# Patient Record
Sex: Female | Born: 1970
Health system: Southern US, Community
[De-identification: ages and names within clinical notes are randomized; demographics above are authoritative.]

## PROBLEM LIST (undated history)

## (undated) DIAGNOSIS — C7B00 Secondary carcinoid tumors, unspecified site: Secondary | ICD-10-CM

## (undated) DIAGNOSIS — I493 Ventricular premature depolarization: Secondary | ICD-10-CM

## (undated) DIAGNOSIS — I10 Essential (primary) hypertension: Secondary | ICD-10-CM

## (undated) DIAGNOSIS — R109 Unspecified abdominal pain: Secondary | ICD-10-CM

## (undated) DIAGNOSIS — R55 Syncope and collapse: Secondary | ICD-10-CM

## (undated) DIAGNOSIS — I509 Heart failure, unspecified: Secondary | ICD-10-CM

## (undated) DIAGNOSIS — Z923 Personal history of irradiation: Secondary | ICD-10-CM

## (undated) DIAGNOSIS — C801 Malignant (primary) neoplasm, unspecified: Secondary | ICD-10-CM

## (undated) DIAGNOSIS — D3A098 Benign carcinoid tumors of other sites: Secondary | ICD-10-CM

## (undated) DIAGNOSIS — C50919 Malignant neoplasm of unspecified site of unspecified female breast: Secondary | ICD-10-CM

## (undated) DIAGNOSIS — R197 Diarrhea, unspecified: Secondary | ICD-10-CM

## (undated) DIAGNOSIS — C50419 Malignant neoplasm of upper-outer quadrant of unspecified female breast: Secondary | ICD-10-CM

## (undated) DIAGNOSIS — Z7189 Other specified counseling: Secondary | ICD-10-CM

## (undated) HISTORY — DX: Heart failure, unspecified: I50.9

## (undated) HISTORY — DX: Secondary carcinoid tumors, unspecified site: C7B.00

## (undated) HISTORY — DX: Malignant neoplasm of unspecified site of unspecified female breast: C50.919

## (undated) HISTORY — DX: Morbid (severe) obesity due to excess calories: E66.01

## (undated) HISTORY — DX: Benign carcinoid tumors of other sites: D3A.098

## (undated) HISTORY — DX: Diarrhea, unspecified: R19.7

## (undated) HISTORY — DX: Essential (primary) hypertension: I10

---
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
## 1999-10-10 DIAGNOSIS — D3A098 Benign carcinoid tumors of other sites: Secondary | ICD-10-CM

## 1999-10-10 HISTORY — PX: OVARIAN CYST REMOVAL: SHX89

## 1999-10-10 HISTORY — PX: COLON SURGERY: SHX602

## 1999-10-10 HISTORY — DX: Benign carcinoid tumors of other sites: D3A.098

## 2011-10-10 DIAGNOSIS — Z9221 Personal history of antineoplastic chemotherapy: Secondary | ICD-10-CM

## 2011-10-10 DIAGNOSIS — Z923 Personal history of irradiation: Secondary | ICD-10-CM

## 2011-10-10 HISTORY — DX: Personal history of irradiation: Z92.3

## 2011-10-10 HISTORY — DX: Personal history of antineoplastic chemotherapy: Z92.21

## 2012-03-14 ENCOUNTER — Telehealth: Payer: Self-pay | Admitting: Hematology & Oncology

## 2012-03-14 NOTE — Telephone Encounter (Signed)
Pt aware of 03-18-12 appointment

## 2012-03-18 ENCOUNTER — Ambulatory Visit: Payer: Medicare Other

## 2012-03-18 ENCOUNTER — Ambulatory Visit (HOSPITAL_BASED_OUTPATIENT_CLINIC_OR_DEPARTMENT_OTHER): Payer: Medicare Other | Admitting: Hematology & Oncology

## 2012-03-18 ENCOUNTER — Other Ambulatory Visit (HOSPITAL_BASED_OUTPATIENT_CLINIC_OR_DEPARTMENT_OTHER): Payer: Medicare Other | Admitting: Lab

## 2012-03-18 ENCOUNTER — Encounter: Payer: Self-pay | Admitting: Hematology & Oncology

## 2012-03-18 VITALS — BP 143/91 | HR 103 | Temp 97.6°F | Ht 67.0 in | Wt 312.0 lb

## 2012-03-18 DIAGNOSIS — D3A098 Benign carcinoid tumors of other sites: Secondary | ICD-10-CM

## 2012-03-18 DIAGNOSIS — C7A00 Malignant carcinoid tumor of unspecified site: Secondary | ICD-10-CM

## 2012-03-18 DIAGNOSIS — C787 Secondary malignant neoplasm of liver and intrahepatic bile duct: Secondary | ICD-10-CM

## 2012-03-18 LAB — CBC WITH DIFFERENTIAL (CANCER CENTER ONLY)
BASO%: 0.2 % (ref 0.0–2.0)
EOS%: 5.2 % (ref 0.0–7.0)
HGB: 12.7 g/dL (ref 11.6–15.9)
LYMPH#: 2 10*3/uL (ref 0.9–3.3)
MCH: 27.5 pg (ref 26.0–34.0)
MCHC: 31.8 g/dL — ABNORMAL LOW (ref 32.0–36.0)
MONO%: 7.1 % (ref 0.0–13.0)
NEUT#: 8.9 10*3/uL — ABNORMAL HIGH (ref 1.5–6.5)
Platelets: 316 10*3/uL (ref 145–400)

## 2012-03-18 NOTE — Progress Notes (Signed)
This office note has been dictated.

## 2012-03-19 NOTE — Progress Notes (Signed)
CC:   Amber Starch, MD Amber Lento, PA-C  DIAGNOSIS:  Metastatic carcinoid with liver metastasis.  HISTORY OF PRESENT ILLNESS:  Amber Bautista is a very nice 41 year old [place] female.  She is originally from [place].  She has been down here for about 7 years or so.  She was initially diagnosed with carcinoid back in 2002.  This was up in [place]. This was in [place].  She then moved down to the [place] area.  She had a child.  She reports that she was also found to have recurrence.  She had hepatic recurrence. She has undergone transarterial chemoembolization.  I think the last procedure was back in May, 2010.  She has been seeing Dr. Nelda Marseille over at [facility].  However, it is becoming more of an issue for her to go out there because of cost.  As such, she wanted to be seen close to home.  She lives in [place].  She was told that she could not be seen at [facility]  As such, we are seeing her to try to provide continuity of care for her carcinoid.  She has been getting Sandostatin.  She began monthly.  However, she has not had this now, probably, for about 4 or 5 months.  She has missed several appointments at [place], there are what seem to be insurance issues.  She has been having diarrhea.  She has about 5 bowel movements a day. She denies any flushing. There is no wheezing.  There is no chest pain. She has had no nausea.  She denies any leg swelling.  There have been no rashes.  Overall, her performance status is ECOG 0.  I think that the last set of scans that I have on her were back in February.  These were CT scans.  She had no suspicious pulmonary nodules.  She had some borderline mediastinal nodes.  She had a larger defined lesion in the liver (segment 2).  She had other additional low- density lesions throughout the liver.  She had no obvious bony lesions in appearance.  MRI of the abdomen and pelvis done in March showed some  abdominal lymph nodes.  She does have a lesion within segment 4 of the liver.  There is a lesion in the right lobe of the liver.  Again she has not had Sandostatin for about 4 months from what she says.  She was able to make it to the office today, so we could initiate our management followup.  PAST MEDICAL HISTORY: 1. Hypertension. 2. Metastatic carcinoid.  ALLERGIES:  None.  MEDICATIONS:  Zestoretic (20/25) 1 p.o. daily.  SOCIAL HISTORY:  Negative for tobacco use.  She has no alcohol use.  She has no obvious occupational exposures.  FAMILY HISTORY:  Noncontributory.  REVIEW OF SYSTEMS:  As stated in history of present illness.  No additional findings noted on a 12 system review.  PHYSICAL EXAMINATION:  This is a somewhat obese Hispanic female in no obvious distress.  Vital signs:  97.6, pulse 103, respiratory rate 24, blood pressure 143/91.  Weight is 312.  Head and neck:  Normocephalic, atraumatic skull.  There are no ocular or oral lesions.  There are no palpable cervical or supraclavicular lymph nodes.  Lungs:  Clear bilaterally.  There are no rales, wheezing, rhonchi.  Cardiac:  Regular rhythm with a normal S1 and S2.  There are no murmurs, rubs or bruits. Abdomen:  Obese but soft.  She  has laparotomy scars that are well- healed.  There is no fluid wave.  There is no guarding or rebound tenderness.  There is no palpable hepatosplenomegaly. Back:  No tenderness over the spine, ribs, or hips.  Extremities: No clubbing, cyanosis or edema.  Neurologic:  No focal neurological deficits.  LABORATORY STUDIES:  White cell count is 12.5, hemoglobin is 12.7, hematocrit 39.9, platelet count 316.  IMPRESSION:  Amber Bautista is a 41 year old Hispanic female with metastatic carcinoid.  She has been dealing with metastatic disease now for 6 years.  She has obviously done quite well.  It does not sound like she has a lot of disease burden from the last scans that I have see on her.  We  need to get her back on Sandostatin.  I believe this is the 1st "order of business."  I do not see that we need to embark upon any kind of chemotherapy for her.  I know that Afinitor was approved for pancreatic neuroendocrine tumors. That certainly would be 1 option.  It sounds like she has had nice response to intrahepatic therapy.  One could always consider yttrium-90 therapy if liver metastases are deemed to be more active.  I did give her a 24-hour urine so we can see her 5-HIAA level.  Will go ahead and get her set up with monthly for Sandostatin injections.  I want to see her back in 1 month.  Once we have all of our lab work back, then we might be able to figure out when to set up for scans again to assess for her disease status.  I spent a good hour or more with Amber Bautista today.  Again, she is very, very nice.    ______________________________ Amber Bautista, M.D. PRE/MEDQ  D:  03/18/2012  T:  03/19/2012  Job:  [id number]   ADDENDUM:  Chromogranin A is 60.  24-hr urine 5-HIAA is 37.

## 2012-03-21 ENCOUNTER — Ambulatory Visit (HOSPITAL_BASED_OUTPATIENT_CLINIC_OR_DEPARTMENT_OTHER): Payer: Medicare Other

## 2012-03-21 VITALS — BP 157/94 | HR 99 | Temp 97.3°F

## 2012-03-21 DIAGNOSIS — C787 Secondary malignant neoplasm of liver and intrahepatic bile duct: Secondary | ICD-10-CM

## 2012-03-21 DIAGNOSIS — D3A098 Benign carcinoid tumors of other sites: Secondary | ICD-10-CM

## 2012-03-21 DIAGNOSIS — C7A00 Malignant carcinoid tumor of unspecified site: Secondary | ICD-10-CM

## 2012-03-21 MED ORDER — OCTREOTIDE ACETATE 30 MG IM KIT
30.0000 mg | PACK | Freq: Once | INTRAMUSCULAR | Status: AC
  Administered 2012-03-21: 30 mg via INTRAMUSCULAR

## 2012-03-21 NOTE — Patient Instructions (Signed)

## 2012-03-26 LAB — COMPREHENSIVE METABOLIC PANEL
ALT: 23 U/L (ref 0–35)
AST: 20 U/L (ref 0–37)
Albumin: 3.6 g/dL (ref 3.5–5.2)
Alkaline Phosphatase: 64 U/L (ref 39–117)
Calcium: 9.2 mg/dL (ref 8.4–10.5)
Chloride: 103 mEq/L (ref 96–112)
Potassium: 3.5 mEq/L (ref 3.5–5.3)
Sodium: 140 mEq/L (ref 135–145)
Total Protein: 7 g/dL (ref 6.0–8.3)

## 2012-03-26 LAB — CHROMOGRANIN A: Chromogranin A: 60 ng/mL — ABNORMAL HIGH (ref 1.9–15.0)

## 2012-04-17 ENCOUNTER — Other Ambulatory Visit: Payer: Medicare Other | Admitting: Lab

## 2012-04-17 ENCOUNTER — Ambulatory Visit (HOSPITAL_BASED_OUTPATIENT_CLINIC_OR_DEPARTMENT_OTHER): Payer: Medicare Other

## 2012-04-17 ENCOUNTER — Ambulatory Visit (HOSPITAL_BASED_OUTPATIENT_CLINIC_OR_DEPARTMENT_OTHER): Payer: Medicare Other | Admitting: Medical

## 2012-04-17 VITALS — BP 148/89 | HR 59 | Temp 97.2°F | Ht 67.0 in | Wt 311.0 lb

## 2012-04-17 DIAGNOSIS — C787 Secondary malignant neoplasm of liver and intrahepatic bile duct: Secondary | ICD-10-CM

## 2012-04-17 DIAGNOSIS — C7A00 Malignant carcinoid tumor of unspecified site: Secondary | ICD-10-CM

## 2012-04-17 DIAGNOSIS — R197 Diarrhea, unspecified: Secondary | ICD-10-CM

## 2012-04-17 DIAGNOSIS — D3A098 Benign carcinoid tumors of other sites: Secondary | ICD-10-CM

## 2012-04-17 DIAGNOSIS — D3A00 Benign carcinoid tumor of unspecified site: Secondary | ICD-10-CM

## 2012-04-17 MED ORDER — OCTREOTIDE ACETATE 30 MG IM KIT
30.0000 mg | PACK | Freq: Once | INTRAMUSCULAR | Status: AC
  Administered 2012-04-17: 30 mg via INTRAMUSCULAR

## 2012-04-17 NOTE — Patient Instructions (Signed)

## 2012-04-18 NOTE — Progress Notes (Signed)
Patient Name : Amber Bautista, Amber Bautista MR [id number] DOB: 07-Jan-1971 Encounter Date: 04/17/2012 Dictated by Helayne Seminole, PA-C  Diagnosis: Metastatic carcinoid with liver metastasis.  Current therapy: Sandostatin LAR 30mg  IM monthly  Interim history: Ms. Amber Bautista presents today for an office followup visit.  Again, Ms. Amber Bautista has been dealing with metastatic carcinoid disease for about 6 years now.  Overall, she has done quite nicely.  She was being treated down at [facility] and being seen by Dr.Yacoub however it was becoming more of an issue for her financially, as well as with transportation.  When she was first seen by Dr. Marin Bautista, she had not been getting any Sandostatin for about 4 or 5 months.  She recently started back on Sandostatin on 03/18/2012.  She was having quite a bit of diarrhea before that time.  Ms. Amber Bautista reports that since she started back up to Sandostatin.  She still continues to have some minimal diarrhea, but it is not as bad as it used to be.  The last set of scans that we have on her was back in February.  These were CT scans at that time.  She had no suspicious pulmonary nodules.  She did have some borderline mediastinal nodes.  She had a larger defined lesion in the liver.  She had other additional low density lesions throughout the liver.  She had no obvious bony lesions in appearance.  She also had an MRI of the abdomen, and pelvis in March, which showed some abdominal lymph nodes.  Also, a lesion within segment 4 of the liver.  There is a lesion in the right lobe of the liver.  Currently, she is not reporting any flushing or wheezing.  She has no chest pain, or shortness of breath.  She denies any nausea, vomiting, any leg swelling.  She denies any rashes.  She is eating and drinking quite well.  She is able to perform her activities of daily living.  She denies any obvious bleeding.  She denies any type of pain.  Ms. Amber Bautista will go ahead and receive her Sandostatin today.  On her last visit.   We did order a 24-hour urine, so we can see her 5-HIAA level and that is 37.  Her chromogranin A. a 60.  Review of Systems:Significant for intermittent diarrhea Pt. Denies any changes in their vision, hearing, adenopathy, fevers, chills, nausea, vomiting, constipation, chest pain, shortness of breath, passing blood, passing out, blacking out,  any changes in skin, joints, neurologic or psychiatric except as noted.  Physical Exam: This is a pleasant, 41 year old, Hispanic female, in no obvious distress  Vitals: Temperature 97.2 degrees.  Pulse 59, respirations 22 blood pressure 148/89, weight 311 pounds HEENT reveals a normocephalic, atraumatic skull, no scleral icterus, no oral lesions  Neck is supple without any cervical or supraclavicular adenopathy.  Lungs are clear to auscultation bilaterally. No rales, wheezing or rhonchi Cardiac is regular rate and rhythm with a normal S1 and S2. There are no murmurs, rubs, or bruits.  Abdomen is soft with good bowel sounds there's a palpable mass. There is no palpable hepatosplenomegaly. There is no palpable fluid wave.  Musculoskeletal no tenderness of the spine, ribs, or hips.  Extremities there are no clubbing, cyanosis, or edema.  Skin no petechia, purpura or ecchymosis Neurologic is nonfocal.  Laboratory Data: No current labs today, however, began, her 24 hour urine/5-HIAA level was 37, and her chromogranin A. with 60  Current Outpatient Prescriptions on File Prior to Visit  Medication  Sig Dispense Refill  . LISINOPRIL-HYDROCHLOROTHIAZIDE PO Take 25 mg by mouth every morning.       Impression/Plan: Ms. Amber Bautista is a pleasant, 41 year old, Hispanic female with the following issues. #1.  Metastatic carcinoid with liver metastasis-she will go ahead and receive her monthly Sandostatin LAR 30 mg IM today.  This seems to keep the diarrhea under control.  We will have to set up scans for her here in the near future.  Depending on the results of her scan.   We can continue obviously with the Sandostatin.  A Finochietto or was approved for pancreatic neuroendocrine tumors, which with certainly be an option.  We could always consider Y.-90 therapy if her liver metastases are deemed to be more active.  The plan is to set her up with some scans.  When she returns to see Dr. Marin Bautista in one month.  #2 followup-Ms. Amber Bautista will follow back up with Dr. Marin Bautista in one month, but before then should there be questions or concerns

## 2012-05-03 ENCOUNTER — Telehealth: Payer: Self-pay | Admitting: Hematology & Oncology

## 2012-05-03 NOTE — Telephone Encounter (Signed)
Pt called said she cannot afford to pay 20% for the injections. RN aware and Abran Richard is looking into assistance

## 2012-05-07 ENCOUNTER — Telehealth: Payer: Self-pay | Admitting: Hematology & Oncology

## 2012-05-07 NOTE — Telephone Encounter (Signed)
Faxed completed application along with required documentation to [facility] for review.  Patient is needing financial assistance for Sandostatin.

## 2012-05-16 ENCOUNTER — Other Ambulatory Visit: Payer: Medicare Other | Admitting: Lab

## 2012-05-16 ENCOUNTER — Ambulatory Visit: Payer: Medicare Other

## 2012-05-16 ENCOUNTER — Ambulatory Visit: Payer: Medicare Other | Admitting: Hematology & Oncology

## 2012-05-20 ENCOUNTER — Telehealth: Payer: Self-pay | Admitting: Hematology & Oncology

## 2012-05-20 NOTE — Telephone Encounter (Signed)
Patient called and cx 814/13 appt and resch for 06/13/12

## 2012-05-22 ENCOUNTER — Ambulatory Visit: Payer: Medicare Other

## 2012-05-22 ENCOUNTER — Other Ambulatory Visit: Payer: Medicare Other | Admitting: Lab

## 2012-05-22 ENCOUNTER — Ambulatory Visit: Payer: Medicare Other | Admitting: Hematology & Oncology

## 2012-06-13 ENCOUNTER — Ambulatory Visit: Payer: Medicare Other | Admitting: Hematology & Oncology

## 2012-06-13 ENCOUNTER — Other Ambulatory Visit: Payer: Medicare Other | Admitting: Lab

## 2012-06-13 ENCOUNTER — Telehealth: Payer: Self-pay | Admitting: Hematology & Oncology

## 2012-06-13 NOTE — Telephone Encounter (Signed)
PT RESCHEDULED MISSED 9-5 TO 9-16

## 2012-06-24 ENCOUNTER — Ambulatory Visit: Payer: Medicare Other | Admitting: Hematology & Oncology

## 2012-06-24 ENCOUNTER — Other Ambulatory Visit: Payer: Medicare Other | Admitting: Lab

## 2012-06-25 ENCOUNTER — Telehealth: Payer: Self-pay | Admitting: Hematology & Oncology

## 2012-06-25 ENCOUNTER — Encounter: Payer: Self-pay | Admitting: *Deleted

## 2012-06-25 NOTE — Telephone Encounter (Signed)
Left pt message to call and reschedule missed 9-16

## 2012-07-01 ENCOUNTER — Telehealth: Payer: Self-pay | Admitting: Hematology & Oncology

## 2012-07-01 NOTE — Telephone Encounter (Signed)
Pt called made 9-27 appointment said was in hospital and needs to see MD

## 2012-07-05 ENCOUNTER — Other Ambulatory Visit (HOSPITAL_BASED_OUTPATIENT_CLINIC_OR_DEPARTMENT_OTHER): Payer: Medicare Other | Admitting: Lab

## 2012-07-05 ENCOUNTER — Other Ambulatory Visit: Payer: Self-pay | Admitting: Hematology & Oncology

## 2012-07-05 ENCOUNTER — Telehealth: Payer: Self-pay | Admitting: Hematology & Oncology

## 2012-07-05 ENCOUNTER — Ambulatory Visit (HOSPITAL_BASED_OUTPATIENT_CLINIC_OR_DEPARTMENT_OTHER): Payer: Medicare Other | Admitting: Medical

## 2012-07-05 VITALS — BP 142/75 | HR 115 | Temp 98.5°F | Resp 18 | Ht 67.0 in | Wt 312.0 lb

## 2012-07-05 DIAGNOSIS — N6320 Unspecified lump in the left breast, unspecified quadrant: Secondary | ICD-10-CM

## 2012-07-05 DIAGNOSIS — C7A00 Malignant carcinoid tumor of unspecified site: Secondary | ICD-10-CM

## 2012-07-05 DIAGNOSIS — N63 Unspecified lump in unspecified breast: Secondary | ICD-10-CM

## 2012-07-05 DIAGNOSIS — C787 Secondary malignant neoplasm of liver and intrahepatic bile duct: Secondary | ICD-10-CM

## 2012-07-05 DIAGNOSIS — D3A098 Benign carcinoid tumors of other sites: Secondary | ICD-10-CM

## 2012-07-05 NOTE — Progress Notes (Signed)
Diagnosis: Metastatic carcinoid with liver metastasis.  Current therapy: Sandostatin LAR 30mg  IM monthly  Interim history: Amber Bautista presents today for followup after being admitted to [facility].  Amber Bautista reports, that she was having some pretty bad abdominal pain, and decided to go to [facility].  She did have a CT of the abdomen, and pelvis with contrast, that was done on 06/27/2012 at [facility], which revealed no acute abnormality.  Known left lobe liver mass.  Smaller when compared to 2008.  There is also retroperitoneal lymph node consistent with metastasis.  Larger when compared to [id number], but of uncertain pathologic significance currently.  It also revealed a left breast mass. The last set of scans that we have on her was back in February.  These were CT scans at that time.  She had no suspicious pulmonary nodules.  She did have some borderline mediastinal nodes.  She had a larger defined lesion in the liver.  She had other additional low density lesions throughout the liver.  She had no obvious bony lesions in appearance.  She also had an MRI of the abdomen, and pelvis in March, which showed some abdominal lymph nodes.  Also, a lesion within segment 4 of the liver.  There is a lesion in the right lobe of the liver.  Ms. cruise and did up having an ultrasound, as well as a mammogram, secondary to the, breast mass.  That was found on her CT scan.  The ultrasound of the left breast revealed a solid 1.7 cm mass with irregular margins and mixed vascularity at the 2:00 position in the left breast.  The right breast was normal.  Right now, this is the biggest issue.  We will have to go ahead and order a biopsy of the left breast mass.  We will go ahead and get that set up so, hopefully, we can get this taken care of quickly.  Amber Bautista remains on Sandostatin.  Since 03/18/2012.  She has not been very consistent with this on a monthly basis due to insurance purposes.  Ms.  Bautista reports, that she is in a hurry today and does not have time to get her Sandostatin; however, will return next week to get.  I stressed the importance of sticking to the Sandostatin on a monthly basis to help control the side effects of her carcinoid disease.  She does have some minimal diarrhea, but it is not as bad as it used to be.  Currently, she's not reporting any major flushing or wheezing.  She's not reporting any chest pain, cough, or shortness of breath.  She denies any nausea, vomiting, any leg swelling.  She denies any headaches, visual changes, or rashes.  She is able to perform her activities of daily living.  She denies any obvious, bleeding.  Her last 5 HIAA quantitative 24-hour urine, was 35.7.  Her last chromogranin A was 60.  These both were done back in June, 2013.  Review of Systems:Significant for intermittent diarrhea Pt. Denies any changes in their vision, hearing, adenopathy, fevers, chills, nausea, vomiting, constipation, chest pain, shortness of breath, passing blood, passing out, blacking out,  any changes in skin, joints, neurologic or psychiatric except as noted.  Physical Exam: This is a pleasant, 41 year old, Hispanic female, in no obvious distress  Vitals: Temperature 98.5 degrees, pulse 114, respirations 18, blood pressure 142/75.  Weight 312 pounds HEENT reveals a normocephalic, atraumatic skull, no scleral icterus, no oral lesions  Neck  is supple without any cervical or supraclavicular adenopathy.  Lungs are clear to auscultation bilaterally. No rales, wheezing or rhonchi Cardiac is regular rate and rhythm with a normal S1 and S2. There are no murmurs, rubs, or bruits.  Abdomen is soft with good bowel sounds there's a palpable mass. There is no palpable hepatosplenomegaly. There is no palpable fluid wave.  Musculoskeletal no tenderness of the spine, ribs, or hips.  Extremities there are no clubbing, cyanosis, or edema.  Skin no petechia, purpura or  ecchymosis Neurologic is nonfocal. Bilateral breast exam-left breast-I am unable to palpate any type of lesion.  However, she does have very large dense breast.  I do not see any obvious, erythema, swelling, nipple discharge, or peau d'orange.  Right breast is negative for any palpable masses, any erythema, swelling, nipple discharge, or peau d'orange.  Laboratory Data: 138, potassium 3.6, BUN 12, creatinine 0.8, 0, AST 17, ALT 22, calcium 9.8  Imaging: CT of the abdomen, with contrast on 06/27/2012. #1.  No acute abnormality.   #2 known left lobe liver mass.  Smaller when compared to 2008.  There is also retroperitoneal lymph node consistent with metastasis.  Larger when compared to [id number], but of uncertain pathologic significance.  Currently. #3 left breast mass.  Ultrasound of left breast: #1 solid 1.7 cm mass with irregular margins and mixed vascularity at the 2:00 position in the left breast.  Current Outpatient Prescriptions on File Prior to Visit  Medication Sig Dispense Refill  . LISINOPRIL-HYDROCHLOROTHIAZIDE PO Take 25 mg by mouth every morning.       Impression/Plan: Amber Bautista is a pleasant, 41 year old, Hispanic female with the following issues.  #1.  Metastatic carcinoid with liver metastasis- she will go ahead and receive her monthly Sandostatin LAR 30 mg IM the beginning of next week.  Again, I did stress the importance of staying on the Sandostatin on a monthly basis for better control.  #2  New left breast mass-we will go ahead and set Amber Bautista up for biopsy of her left breast mass.  Once we get pathology back from her biopsy we can discuss further treatment  #3  followup-we will be in touch with Amber Bautista prior to her next visit.

## 2012-07-05 NOTE — Telephone Encounter (Signed)
Talked with Amber Bautista at [place] scheduled 07-12-12 mammogram and biopsy. Provider is putting in order in epic. Pt is aware of 1pm 07-12-12 appointment and not to wear deodorant,lotion, powder and perfume. She is aware to eat a late breakfast.

## 2012-07-08 ENCOUNTER — Ambulatory Visit: Payer: Medicare Other

## 2012-07-08 DIAGNOSIS — N6320 Unspecified lump in the left breast, unspecified quadrant: Secondary | ICD-10-CM | POA: Insufficient documentation

## 2012-07-10 LAB — COMPREHENSIVE METABOLIC PANEL
AST: 17 U/L (ref 0–37)
Alkaline Phosphatase: 70 U/L (ref 39–117)
BUN: 12 mg/dL (ref 6–23)
Glucose, Bld: 123 mg/dL — ABNORMAL HIGH (ref 70–99)
Sodium: 138 mEq/L (ref 135–145)
Total Bilirubin: 0.4 mg/dL (ref 0.3–1.2)

## 2012-07-12 ENCOUNTER — Ambulatory Visit
Admission: RE | Admit: 2012-07-12 | Discharge: 2012-07-12 | Disposition: A | Discharge status: Home / Self Care | Payer: Medicare Other | Source: Ambulatory Visit | Attending: Hematology & Oncology | Admitting: Hematology & Oncology

## 2012-07-12 ENCOUNTER — Other Ambulatory Visit: Payer: Medicare Other

## 2012-07-12 DIAGNOSIS — N6320 Unspecified lump in the left breast, unspecified quadrant: Secondary | ICD-10-CM

## 2012-07-12 DIAGNOSIS — C50919 Malignant neoplasm of unspecified site of unspecified female breast: Secondary | ICD-10-CM

## 2012-07-12 HISTORY — DX: Malignant neoplasm of unspecified site of unspecified female breast: C50.919

## 2012-07-15 ENCOUNTER — Telehealth: Payer: Self-pay | Admitting: Hematology & Oncology

## 2012-07-15 ENCOUNTER — Other Ambulatory Visit: Payer: Self-pay | Admitting: Hematology & Oncology

## 2012-07-15 DIAGNOSIS — C50912 Malignant neoplasm of unspecified site of left female breast: Secondary | ICD-10-CM

## 2012-07-15 NOTE — Telephone Encounter (Signed)
Juliann Pulse from [facility] called said Dr. Nyoka Cowden talked to Dr. Marin Olp and that they ordered MRI. Pt is aware. Left note for Abran Richard to precert per request of [place]

## 2012-07-19 ENCOUNTER — Inpatient Hospital Stay: Admission: RE | Admit: 2012-07-19 | Payer: Medicare Other | Source: Ambulatory Visit

## 2012-07-22 ENCOUNTER — Encounter (INDEPENDENT_AMBULATORY_CARE_PROVIDER_SITE_OTHER): Payer: Medicare Other | Admitting: General Surgery

## 2012-07-26 ENCOUNTER — Ambulatory Visit
Admission: RE | Admit: 2012-07-26 | Discharge: 2012-07-26 | Disposition: A | Discharge status: Home / Self Care | Payer: Medicare Other | Source: Ambulatory Visit | Attending: Hematology & Oncology | Admitting: Hematology & Oncology

## 2012-07-26 DIAGNOSIS — C50912 Malignant neoplasm of unspecified site of left female breast: Secondary | ICD-10-CM

## 2012-07-26 MED ORDER — GADOBENATE DIMEGLUMINE 529 MG/ML IV SOLN
20.0000 mL | Freq: Once | INTRAVENOUS | Status: AC | PRN
  Administered 2012-07-26: 20 mL via INTRAVENOUS

## 2012-08-01 ENCOUNTER — Other Ambulatory Visit: Payer: Self-pay | Admitting: Medical

## 2012-08-01 ENCOUNTER — Ambulatory Visit (INDEPENDENT_AMBULATORY_CARE_PROVIDER_SITE_OTHER): Payer: Medicare Other | Admitting: General Surgery

## 2012-08-01 ENCOUNTER — Other Ambulatory Visit (INDEPENDENT_AMBULATORY_CARE_PROVIDER_SITE_OTHER): Payer: Self-pay | Admitting: General Surgery

## 2012-08-01 ENCOUNTER — Telehealth: Payer: Self-pay | Admitting: Hematology & Oncology

## 2012-08-01 ENCOUNTER — Encounter (INDEPENDENT_AMBULATORY_CARE_PROVIDER_SITE_OTHER): Payer: Self-pay | Admitting: General Surgery

## 2012-08-01 VITALS — BP 142/86 | HR 78 | Temp 98.6°F | Resp 16 | Ht 68.0 in | Wt 311.8 lb

## 2012-08-01 DIAGNOSIS — C50419 Malignant neoplasm of upper-outer quadrant of unspecified female breast: Secondary | ICD-10-CM | POA: Insufficient documentation

## 2012-08-01 HISTORY — DX: Malignant neoplasm of upper-outer quadrant of unspecified female breast: C50.419

## 2012-08-01 NOTE — Patient Instructions (Signed)
You had been diagnosed with a invasive cancer of the left breast, upper outer quadrant, 2 cm diameter. This tumor is negative for estrogen receptors, progesterone receptors, and Herceptin sensitivity.  Because of this you will likely be offered chemotherapy by Dr. Marin Olp.    Helayne Seminole, PA  in his office is going to call you and set up an appointment to see Dr. Marin Olp early next week to decide about chemotherapy  You will tentatively be scheduled for a left partial mastectomy with needle localization, left axillary sentinel node biopsy, and insertion of Port-A-Cath.   Lumpectomy, Breast Conserving Surgery A lumpectomy is breast surgery that removes only part of the breast. Another name used may be partial mastectomy. The amount removed varies. Make sure you understand how much of your breast will be removed. Reasons for a lumpectomy:  Any solid breast mass.  Grouped significant nodularity that may be confused with a solitary breast mass. Lumpectomy is the most common form of breast cancer surgery today. The surgeon removes the portion of your breast which contains the tumor (cancer). This is the lump. Some normal tissue around the lump is also removed to be sure that all the tumor has been removed.  If cancer cells are found in the margins where the breast tissue was removed, your surgeon will do more surgery to remove the remaining cancer tissue. This is called re-excision surgery. Radiation and/or chemotherapy treatments are often given following a lumpectomy to kill any cancer cells that could possibly remain.  REASONS YOU MAY NOT BE ABLE TO HAVE BREAST CONSERVING SURGERY:  The tumor is located in more than one place.  Your breast is small and the tumor is large so the breast would be disfigured.  The entire tumor removal is not successful with a lumpectomy.  You cannot commit to a full course of chemotherapy, radiation therapy or are pregnant and cannot have radiation.  You have  previously had radiation to the breast to treat cancer. HOW A LUMPECTOMY IS PERFORMED If overnight nursing is not required following a biopsy, a lumpectomy can be performed as a same-day surgery. This can be done in a hospital, clinic, or surgical center. The anesthesia used will depend on your surgeon. They will discuss this with you. A general anesthetic keeps you sleeping through the procedure. LET YOUR CAREGIVERS KNOW ABOUT THE FOLLOWING:  Allergies  Medications taken including herbs, eye drops, over the counter medications, and creams.  Use of steroids (by mouth or creams)  Previous problems with anesthetics or Novocaine.  Possibility of pregnancy, if this applies  History of blood clots (thrombophlebitis)  History of bleeding or blood problems.  Previous surgery  Other health problems BEFORE THE PROCEDURE You should be present one hour prior to your procedure unless directed otherwise.  AFTER THE PROCEDURE  After surgery, you will be taken to the recovery area where a nurse will watch and check your progress. Once you're awake, stable, and taking fluids well, barring other problems you will be allowed to go home.  Ice packs applied to your operative site may help with discomfort and keep the swelling down.  A small rubber drain may be placed in the breast for a couple of days to prevent a hematoma from developing in the breast.  A pressure dressing may be applied for 24 to 48 hours to prevent bleeding.  Keep the wound dry.  You may resume a normal diet and activities as directed. Avoid strenuous activities affecting the arm on the side  of the biopsy site such as tennis, swimming, heavy lifting (more than 10 pounds) or pulling.  Bruising in the breast is normal following this procedure.  Wearing a bra - even to bed - may be more comfortable and also help keep the dressing on.  Change dressings as directed.  Only take over-the-counter or prescription medicines for  pain, discomfort, or fever as directed by your caregiver. Call for your results as instructed by your surgeon. Remember it is your responsibility to get the results of your lumpectomy if your surgeon asked you to follow-up. Do not assume everything is fine if you have not heard from your caregiver. SEEK MEDICAL CARE IF:   There is increased bleeding (more than a small spot) from the wound.  You notice redness, swelling, or increasing pain in the wound.  Pus is coming from wound.  An unexplained oral temperature above 102 F (38.9 C) develops.  You notice a foul smell coming from the wound or dressing. SEEK IMMEDIATE MEDICAL CARE IF:   You develop a rash.  You have difficulty breathing.  You have any allergic problems. Document Released: 11/06/2006 Document Revised: 12/18/2011 Document Reviewed: 02/07/2007 [facility] Patient Information 2013 [place].

## 2012-08-01 NOTE — Progress Notes (Addendum)
Patient ID: Amber Bautista, female   DOB: [birth year], 41 y.o.   MRN: [id number]  No chief complaint on file.   HPI Amber Bautista is a 41 y.o. female.  She is referred by Dr. Conchita Paris at the breast center of [facility] for evaluation of a new cancer of the left breast. She is followed by Burney Gauze for metastatic carcinoid tumor.  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 She is on Sandostatin because of multiple liver metastasis. She was admitted to [place] [facility] because of abdominal pain and imaging studies revealed a 1.7 cm mass in the upper-outer quadrant of the left breast. She was discharged and referred to the breast center of [facility] by Dr. Antonieta Pert office. Imaging studies and image guided biopsy of this upper-outer quadrant mass on the left showed invasive ductal carcinoma, triple negative breast cancer, positive LVI.       MRI shows a solitary 2 cm mass in the upper outer quadrant left breast. Otherwise negative.  Past history is significant for morbid obesity. She was undergoing surgery for a bariatric procedure in [place] in 2001 the surgeon discovered a carcinoid tumor of the intestine. They abandoned the bariatric surgery performed an intestinal resection. Details unknown. Liver metastasis were discovered in 2007. She has problems with diarrhea and takes Sandostatin which only partially controls this. She is followed by Dr. Burney Gauze.    She remains morbidly obese. She has hypertension.  She now lives in [place] with her mother and 43-year-old son. She is divorced. She is on disability.  Family history is negative for breast or ovarian cancer. HPI  Past Medical History  Diagnosis Date  . Carcinoid tumor of intestine 03/18/2012  . Cancer   . Hypertension     Past Surgical History  Procedure Date  . Cesarean section 1988 2007  . Ovarian cyst removal 2001  . Colon surgery     Family History    Problem Relation Age of Onset  . Heart attack Father   . Cancer Paternal Grandmother     Social History History  Substance Use Topics  . Smoking status: Former Research scientist (life sciences)  . Smokeless tobacco: Not on file  . Alcohol Use: No    No Known Allergies  Current Outpatient Prescriptions  Medication Sig Dispense Refill  . diphenhydramine-acetaminophen (TYLENOL PM) 25-500 MG TABS Take 1 tablet by mouth at bedtime as needed.      Marland Kitchen LISINOPRIL-HYDROCHLOROTHIAZIDE PO Take 25 mg by mouth every morning.        Review of Systems Review of Systems  Constitutional: Positive for fatigue. Negative for fever, chills and unexpected weight change.  HENT: Negative for hearing loss, congestion, sore throat, trouble swallowing and voice change.   Eyes: Negative for visual disturbance.  Respiratory: Negative for cough and wheezing.   Cardiovascular: Negative for chest pain, palpitations and leg swelling.  Gastrointestinal: Positive for diarrhea. Negative for nausea, vomiting, abdominal pain, constipation, blood in stool, abdominal distention and anal bleeding.  Genitourinary: Negative for hematuria, vaginal bleeding and difficulty urinating.  Musculoskeletal: Negative for arthralgias.  Skin: Negative for rash and wound.  Neurological: Negative for seizures, syncope and headaches.  Hematological: Negative for adenopathy. Does not bruise/bleed easily.  Psychiatric/Behavioral: Negative for confusion. The patient is nervous/anxious.     Blood pressure 142/86, pulse 78, temperature 98.6 F (37 C), resp. rate 16, height 5\' 8"  (1.727 m), weight 311 lb 12.8 oz (141.432 kg).  Physical Exam Physical Exam  Constitutional: She is oriented to person, place, and time. She appears well-developed and well-nourished. No distress.       Weight 311 lbs.  HENT:  Head: Normocephalic and atraumatic.  Nose: Nose normal.  Mouth/Throat: No oropharyngeal exudate.  Eyes: Conjunctivae normal and EOM are normal. Pupils are  equal, round, and reactive to light. Left eye exhibits no discharge. No scleral icterus.  Neck: Neck supple. No JVD present. No tracheal deviation present. No thyromegaly present.  Cardiovascular: Normal rate, regular rhythm, normal heart sounds and intact distal pulses.   No murmur heard. Pulmonary/Chest: Effort normal and breath sounds normal. No respiratory distress. She has no wheezes. She has no rales. She exhibits no tenderness.       Bilateral breast exam reveals that breasts are very large. No palpable mass. No pathologic skin change. No axillary adenopathy.  Abdominal: Soft. Bowel sounds are normal. She exhibits no distension and no mass. There is no tenderness. There is no rebound and no guarding.       Midline scar. No hernia. Liver and spleen not palpable. Morbidly obese.  Musculoskeletal: She exhibits no edema and no tenderness.  Lymphadenopathy:    She has no cervical adenopathy.  Neurological: She is alert and oriented to person, place, and time. She exhibits normal muscle tone. Coordination normal.  Skin: Skin is warm. No rash noted. She is not diaphoretic. No erythema. No pallor.  Psychiatric: She has a normal mood and affect. Her behavior is normal. Judgment and thought content normal.    Data Reviewed Recent office note from Dr. Marin Olp  regarding carcinoid tumor. Imaging studies. Pathology report.  Assessment    Invasive ductal carcinoma left breast, upper outer quadrant, triple negative breast cancer. This is either T1C or small T2 tumor.  Carcinoid tumor of intestine, metastatic to liver  Carcinoid syndrome  Morbid obesity  Hypertension    Plan    We talked a long time about management of her breast cancer. She is strongly motivated for breast conservation, and I agree that would be in her best interest considering the size of her breast and her comorbidities.  Because of she is triple negative breast cancer I spoke with Helayne Seminole, PA at   Dr. Antonieta Pert  office about chemotherapy and need for Port-A-Cath. She says that she will probably be offered chemotherapy and that we should put a Port-A-Cath in. She is going to arrange for the patient to see Dr. Marin Olp early next week, preop  The patient will be scheduled for left partial mastectomy with needle localization, left axillary sentinel liver biopsy, insertion of Port-A-Cath in the near future.  I discussed the indications, details, techniques and numerous risk of the surgery with her. She understands these issues. She agrees with this plan. Her questions are answered.       Edsel Petrin. Dalbert Batman, M.D., [facility] General and Minimally invasive Surgery Breast and Colorectal Surgery Office:   [phone] Pager:   [phone]  08/01/2012, 11:58 AM

## 2012-08-01 NOTE — Telephone Encounter (Signed)
Left message with 10-29 appointment

## 2012-08-06 ENCOUNTER — Ambulatory Visit (HOSPITAL_BASED_OUTPATIENT_CLINIC_OR_DEPARTMENT_OTHER): Payer: Medicare Other | Admitting: Medical

## 2012-08-06 ENCOUNTER — Ambulatory Visit (HOSPITAL_BASED_OUTPATIENT_CLINIC_OR_DEPARTMENT_OTHER): Payer: Medicare Other

## 2012-08-06 VITALS — BP 143/77 | HR 96 | Temp 98.0°F | Resp 18 | Ht 68.0 in | Wt 312.0 lb

## 2012-08-06 DIAGNOSIS — C7A00 Malignant carcinoid tumor of unspecified site: Secondary | ICD-10-CM

## 2012-08-06 DIAGNOSIS — C787 Secondary malignant neoplasm of liver and intrahepatic bile duct: Secondary | ICD-10-CM

## 2012-08-06 DIAGNOSIS — C50419 Malignant neoplasm of upper-outer quadrant of unspecified female breast: Secondary | ICD-10-CM

## 2012-08-06 DIAGNOSIS — D3A098 Benign carcinoid tumors of other sites: Secondary | ICD-10-CM

## 2012-08-06 MED ORDER — OCTREOTIDE ACETATE 30 MG IM KIT
30.0000 mg | PACK | Freq: Once | INTRAMUSCULAR | Status: AC
  Administered 2012-08-06: 30 mg via INTRAMUSCULAR

## 2012-08-06 NOTE — Progress Notes (Signed)
Diagnoses #1 new invasive ductal carcinoma of the left breast, upper, outer quadrant, triple negative #2 metastatic carcinoid with liver metastasis.  Current therapy: #1 Sandostatin LAR 30 mg IM monthly for metastatic carcinoid. #2 patient to be scheduled for left partial mastectomy with needle localization, left axillary sentinel node biopsy, and insertion of Port-A-Cath  Interim history: Amber Bautista presents today to discuss need for Port-A-Cath placement.  Unfortunately, Amber Bautista was recently diagnosed with invasive ductal carcinoma of the left breast.  Her pathology did reveal triple negative breast cancer.  Amber Bautista did followup with Dr. Dalbert Batman, who plans to do a left partial mastectomy with needle localization and left axillary sentinel node biopsy.  The overall plan for Amber Bautista is to give her adjuvant chemotherapy for which she will need a Port-A-Cath for her.  Overall, Amber Bautista, reports, that she's doing relatively well considering the recent news of her new diagnosis with breast cancer.  Amber Bautista agrees that breast conservation would be her best interest considering the size of her breasts and her comorbidities.  Due to her triple negative.  Status.  I did explain that she won't need adjuvant chemotherapy, most likely with Adriamycin, Cytoxan, and Taxotere x4 cycles.  Amber Bautista, understands her triple negative status, and the need for adjuvant chemotherapy.  I did advise Amber Bautista, the need of a Port-A-Cath placement, while she is receiving chemotherapy.  I also explained how Adriamycin is a vesicant and we would prefer to use a Port-A-Cath instead of using her veins.  She voiced her understanding.  I also informed.  Amber Bautista, that she will need a 2-D echo to evaluate her ejection fraction prior to chemotherapy. In terms of her metastatic carcinoid, she continues to remain on Sandostatin monthly.  However, she has not been that compliant on a monthly basis.  She will get her injection today.  She  reports, that her diarrhea is minimal.  She denies any type of flushing.  She denies any nausea, vomiting, chest pain, shortness of breath, or cough.  She denies any fevers, chills, or night sweats.  She denies any headaches, visual changes, or rashes.  She denies any lower leg swelling.  She denies any obvious, bleeding.  Her last 5 HIAA quantitative 24 hour urine was 35.7.  Her last chromogranin A. Was 79.  Review of Systems: Pt. Denies any changes in their vision, hearing, adenopathy, fevers, chills, nausea, vomiting, diarrhea, constipation, chest pain, shortness of breath, passing blood, passing out, blacking out,  any changes in skin, joints, neurologic or psychiatric except as noted.  Physical Exam: This is a morbidly, obese, 41 year old, female, in no obvious distress Vitals: Temperature 90.0 degrees, pulse 96, respirations 18, blood pressure 143/77, weight 312 pounds HEENT reveals a normocephalic, atraumatic skull, no scleral icterus, no oral lesions  Neck is supple without any cervical or supraclavicular adenopathy.  Lungs are clear to auscultation bilaterally. There are no wheezes, rales or rhonci Cardiac is regular rate and rhythm with a normal S1 and S2. There are no murmurs, rubs, or bruits.  Abdomen is soft with good bowel sounds, there is no palpable mass. There is no palpable hepatosplenomegaly. There is no palpable fluid wave.  Musculoskeletal no tenderness of the spine, ribs, or hips.  Extremities there are no clubbing, cyanosis, or edema.  Skin no petechia, purpura or ecchymosis Neurologic is nonfocal.  Current Outpatient Prescriptions on File Prior to Visit  Medication Sig Dispense Refill  . diphenhydramine-acetaminophen (TYLENOL PM) 25-500 MG TABS Take 1  tablet by mouth at bedtime as needed.      Marland Kitchen LISINOPRIL-HYDROCHLOROTHIAZIDE PO Take 25 mg by mouth every morning.       Assessment/Plan: Amber Bautista is a pleasant, 41 year old, Hispanic female, with the following issues:  #1  newly diagnosed invasive ductal carcinoma of the left breast, upper, outer quadrant, triple negative.  Amber Bautista is scheduled for a left partial mastectomy with needle localization and left axillary sentinel node biopsy on 08/23/2012.  At that time.  She will also have her Port-A-Cath placed.  She will then receive adjuvant chemotherapy, most likely utilizing Adriamycin, Cytoxan, and Taxotere.  We discussed at length.  The toxicity related to TAC to include, but not limited to: Bone marrow suppression , to include anemia, neutropenia and thrombocytopenia, nausea, vomiting, diarrhea, neuropathy, hair loss, mucositis, skin changes, sun sensitivity, nail changes, fatigue, increased risk of infection to include sepsis, fluid retention, headache, weight loss.  We will also go ahead and get Amber Bautista set up for her 2D ECHO prior to chemotherapy to evaluate ejection fraction.  #2 metastatic carcinoid with liver metastasis.  I did discuss the importance of staying compliant with her Sandostatin.  She will receive Sandostatin LAR 30 mg today.  She should receive this on a monthly basis.   #3 follow up.   Amber Bautista will follow back up with [place] on 09/06/2012, but before then should there be questions or concerns.

## 2012-08-06 NOTE — Patient Instructions (Signed)

## 2012-08-09 ENCOUNTER — Encounter (HOSPITAL_COMMUNITY): Payer: Self-pay | Admitting: Pharmacy Technician

## 2012-08-14 ENCOUNTER — Ambulatory Visit (HOSPITAL_BASED_OUTPATIENT_CLINIC_OR_DEPARTMENT_OTHER): Admission: RE | Admit: 2012-08-14 | Payer: Medicare Other | Source: Ambulatory Visit

## 2012-08-15 ENCOUNTER — Telehealth (INDEPENDENT_AMBULATORY_CARE_PROVIDER_SITE_OTHER): Payer: Self-pay | Admitting: General Surgery

## 2012-08-15 ENCOUNTER — Encounter (HOSPITAL_COMMUNITY)
Admission: RE | Admit: 2012-08-15 | Discharge: 2012-08-15 | Disposition: A | Discharge status: Home / Self Care | Payer: Medicare Other | Source: Ambulatory Visit | Attending: General Surgery | Admitting: General Surgery

## 2012-08-15 ENCOUNTER — Encounter (HOSPITAL_COMMUNITY): Payer: Self-pay

## 2012-08-15 LAB — COMPREHENSIVE METABOLIC PANEL
ALT: 48 U/L — ABNORMAL HIGH (ref 0–35)
AST: 29 U/L (ref 0–37)
Alkaline Phosphatase: 87 U/L (ref 39–117)
CO2: 26 mEq/L (ref 19–32)
GFR calc Af Amer: >90 mL/min (ref >90)
GFR calc non Af Amer: >90 mL/min (ref >90)
Glucose, Bld: 104 mg/dL — ABNORMAL HIGH (ref 70–99)
Potassium: 3.7 mEq/L (ref 3.5–5.1)
Sodium: 137 mEq/L (ref 135–145)
Total Protein: 8.2 g/dL (ref 6.0–8.3)

## 2012-08-15 LAB — CBC WITH DIFFERENTIAL/PLATELET
Basophils Absolute: 0 10*3/uL (ref 0.0–0.1)
Lymphocytes Relative: 24 % (ref 12–46)
Lymphs Abs: 3.3 10*3/uL (ref 0.7–4.0)
Neutro Abs: 9.1 10*3/uL — ABNORMAL HIGH (ref 1.7–7.7)
Neutrophils Relative %: 65 % (ref 43–77)
Platelets: 387 10*3/uL (ref 150–400)
RBC: 4.79 MIL/uL (ref 3.87–5.11)
WBC: 13.9 10*3/uL — ABNORMAL HIGH (ref 4.0–10.5)

## 2012-08-15 LAB — URINE MICROSCOPIC-ADD ON

## 2012-08-15 LAB — URINALYSIS, ROUTINE W REFLEX MICROSCOPIC
Bilirubin Urine: NEGATIVE
Ketones, ur: NEGATIVE mg/dL
Nitrite: NEGATIVE
pH: 7 (ref 5.0–8.0)

## 2012-08-15 LAB — HCG, SERUM, QUALITATIVE: Preg, Serum: NEGATIVE

## 2012-08-15 LAB — SURGICAL PCR SCREEN
MRSA, PCR: NEGATIVE
Staphylococcus aureus: NEGATIVE

## 2012-08-15 MED ORDER — CHLORHEXIDINE GLUCONATE 4 % EX LIQD: 1.0000 "application " | Freq: Once | CUTANEOUS | Status: DC

## 2012-08-15 NOTE — Pre-Procedure Instructions (Signed)
Pass Christian  08/15/2012   Your procedure is scheduled on:  Friday, November 15th.  Report to [facility]  After your 9:30 appointment at the [facility].  Call this number if you have problems the morning of surgery: [phone]   Remember:   Do not eat food or anything to drink:After Midnight.     Take these medicines the morning of surgery with A SIP OF WATER: None   Do not wear jewelry, make-up or nail polish.  Do not wear lotions, powders, or perfumes. You may wear deodorant.  Do not shave 48 hours prior to surgery. Men may shave face and neck.  Do not bring valuables to the hospital.  Contacts, dentures or bridgework may not be worn into surgery.  Leave suitcase in the car. After surgery it may be brought to your room.  For patients admitted to the hospital, checkout time is 11:00 AM the day of discharge.   Patients discharged the day of surgery will not be allowed to drive home.  Name and phone number of your driver: NA  Special Instructions: Shower using CHG 2 nights before surgery and the night before surgery.  If you shower the day of surgery use CHG.  Use special wash - you have one bottle of CHG for all showers.  You should use approximately 1/3 of the bottle for each shower.   Please read over the following fact sheets that you were given: Pain Booklet, Coughing and Deep Breathing and Surgical Site Infection Prevention

## 2012-08-15 NOTE — Brief Op Note (Signed)
Informed Dr Oletta Lamas of pt's abnormal EKG. Also informed that ECHO ordered by Dr. Dalbert Batman was not done at [facility]. Pt stated ECHO will be done 11/13. Called Dr Dalbert Batman office and made [facility] aware .

## 2012-08-15 NOTE — Telephone Encounter (Signed)
Enid Derry, [facility] Short Stay, called to alert Dr. Dalbert Batman his pt had an abnormal EKG today at her pre-op appt.  Surgery is schedule on 08/23/12.  The EKG today showed NST, rate 106, with LBBB.  Pt scheduled for ECHO on 08/21/12.  Anesthesiologist is aware.  No cardiac clearance as requested.  Dr. Dalbert Batman updated with this and ordered nothing further at this time.

## 2012-08-16 LAB — URINE CULTURE

## 2012-08-16 NOTE — Consult Note (Addendum)
Anesthesia Chart Review:  Patient is a 41 year old female scheduled for left partial mastectomy with needle localization, left axillary NS biopsy, insertion of Port-a-cath on 08/23/12 by Dr. Dalbert Batman.  History includes non-smoker, morbid obesity, HTN, left breast cancer, metastatic carcinoid tumor of the intestine s/p resection in [place] '01 (liver metastasis discovered in 2007 s/p transarterial chemoembolization '10, now on Sandostatin).  Oncologist is Dr. Burney Gauze.   Her EKG on 08/15/12 showed ST @ 106, possible LAE, LAD, left BBB.  I received some records from [facility].  She was diagnosed with a left BBB on 10/21/08.    Echo done at [facility] on 10/22/08 showed normal LV function with mild LVH, mild LAE, insignificant gradient across Ao valve, possible mild AS (AV peak velocity 211 cm/s, peak gradient 17.81 mmHg, mean gradient 10.1 mmHG, VTI 38 cm, AVA (contunuity) 1.74 cm2), no significant carcinoid valvular heart disease noted.  She is for an echo at [facility] on 08/21/12.  Meds include lisinopril-HCTZ and Tylenol PM.  CXR on 08/15/12 showed no acute abnormality.  Labs noted.  WBC 13.9.  H/H 13.4/41.7.  PLT 387.  Cr 0.56.  Glucose 104.  Negative serum pregnancy.  I was off on 08/15/12, but according to her PAT RN and [facility] RN notes, Anesthesiologist Dr. Oletta Lamas and Dr. Dalbert Batman were both made aware of patient's EKG findings.  Patient is getting an echo on 08/21/12 @ 1000.  Will follow-up results as available.  Myra Gianotti, PA-C 08/16/12 1258  Addendum: 08/21/12 1600 Echo done earlier today showed:  - Left ventricle: The cavity size was severely dilated. Systolic function was severely reduced. The estimated ejection fraction was in the range of 25% to 30%. - Mitral valve: Mild regurgitation. - Left atrium: The atrium was mildly dilated.  I spoke with Triage nurse Pamala Hurry at [facility] re: echo results.  She does not think results have been reviewed yet by Dr. Dalbert Batman (will route report to  his in-basket) and does not see that Ms. Hettinger is followed by a cardiologist.  Her left BBB has been chronic for three years, but LV dysfunction is new.  She is on ACEI therapy.  She will likely need to be evaluated by a cardiologist in the near future (defer decision to Dr. Marin Olp and/or Dr. Dalbert Batman).  [facility]) did not have any open appointments prior to patient's surgery date.  I reviewed above with Anesthesiologist Dr. Chriss Driver.  She will be evaluated by her assigned anesthesiologist on the day of surgery.  If patient was not having any acute CV/CHF symptoms he did not necessarily feel patient would have to be evaluated by a cardiologist prior to her mastectomy.

## 2012-08-21 ENCOUNTER — Ambulatory Visit (HOSPITAL_BASED_OUTPATIENT_CLINIC_OR_DEPARTMENT_OTHER)
Admission: RE | Admit: 2012-08-21 | Discharge: 2012-08-21 | Disposition: A | Discharge status: Home / Self Care | Payer: Medicare Other | Source: Ambulatory Visit | Attending: Medical | Admitting: Medical

## 2012-08-21 DIAGNOSIS — I059 Rheumatic mitral valve disease, unspecified: Secondary | ICD-10-CM | POA: Insufficient documentation

## 2012-08-21 DIAGNOSIS — Z01812 Encounter for preprocedural laboratory examination: Secondary | ICD-10-CM | POA: Insufficient documentation

## 2012-08-21 DIAGNOSIS — C50919 Malignant neoplasm of unspecified site of unspecified female breast: Secondary | ICD-10-CM | POA: Insufficient documentation

## 2012-08-21 DIAGNOSIS — I10 Essential (primary) hypertension: Secondary | ICD-10-CM | POA: Insufficient documentation

## 2012-08-21 DIAGNOSIS — I517 Cardiomegaly: Secondary | ICD-10-CM | POA: Insufficient documentation

## 2012-08-21 DIAGNOSIS — Z01818 Encounter for other preprocedural examination: Secondary | ICD-10-CM | POA: Insufficient documentation

## 2012-08-21 DIAGNOSIS — Z01810 Encounter for preprocedural cardiovascular examination: Secondary | ICD-10-CM | POA: Insufficient documentation

## 2012-08-21 DIAGNOSIS — C50419 Malignant neoplasm of upper-outer quadrant of unspecified female breast: Secondary | ICD-10-CM

## 2012-08-21 NOTE — Progress Notes (Signed)
  Echocardiogram 2D Echocardiogram has been performed.  Amber Bautista 08/21/2012, 12:53 PM

## 2012-08-22 MED ORDER — DEXTROSE 5 % IV SOLN
3.0000 g | INTRAVENOUS | Status: DC
  Filled 2012-08-22: qty 3000

## 2012-08-22 MED ORDER — HEPARIN SODIUM (PORCINE) 5000 UNIT/ML IJ SOLN
5000.0000 [IU] | Freq: Once | INTRAMUSCULAR | Status: AC
  Administered 2012-08-23: 5000 [IU] via SUBCUTANEOUS
  Filled 2012-08-22: qty 1

## 2012-08-22 NOTE — H&P (Signed)
Amber Bautista     MRN: [id number]   Description: 41 year old female  Provider: Adin Hector, MD  Department: [facility]       Diagnoses     Cancer of upper-outer quadrant of female breast   - Primary    174.4         Vitals    BP Pulse Temp Resp Ht Wt    142/86 78 98.6 F (37 C) 16 5\' 8"  (1.727 m) 311 lb 12.8 oz (141.432 kg)   BMI - 47.41 kg/m2                 History and Physical     Adin Hector, MD   Patient ID: Amber Bautista, female   DOB: 1971/03/12, 41 y.o.   MRN: [id number]            HPI Amber Bautista is a 41 y.o. female.  She is referred by Dr. Conchita Paris at the breast center of [facility] for evaluation of a new cancer of the left breast. She is followed by Burney Gauze for metastatic carcinoid tumor.   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 She is on Sandostatin because of multiple liver metastasis. She was admitted [place] [facility] because of abdominal pain and imaging studies revealed a 1.7 cm mass in the upper-outer quadrant of the left breast. She was discharged and referred to the breast center of [facility] by Dr. Antonieta Pert office. Imaging studies and image guided biopsy of this upper-outer quadrant mass on the left showed invasive ductal carcinoma, triple negative breast cancer, positive LVI.       MRI shows a solitary 2 cm mass in the upper outer quadrant left breast. Otherwise negative.   Past history is significant for morbid obesity. She was undergoing surgery for a bariatric procedure in [place] in 2001 the surgeon discovered a carcinoid tumor of the intestine. They abandoned the bariatric surgery performed an intestinal resection. Details unknown. Liver metastasis were discovered in 2007. She has problems with diarrhea and takes Sandostatin which only partially controls this. She is followed by Dr. Burney Gauze.    She remains morbidly obese. She has  hypertension.   She now lives in [place] with her mother and 75-year-old son. She is divorced. She is on disability.   Family history is negative for breast or ovarian cancer.       Past Medical History   Diagnosis  Date   .  Carcinoid tumor of intestine  03/18/2012   .  Cancer     .  Hypertension         Past Surgical History   Procedure  Date   .  Cesarean section  1988 2007   .  Ovarian cyst removal  2001   .  Colon surgery         Family History   Problem  Relation  Age of Onset   .  Heart attack  Father     .  Cancer  Paternal Grandmother        Social History History   Substance Use Topics   .  Smoking status:  Former Research scientist (life sciences)   .  Smokeless tobacco:  Not on file   .  Alcohol Use:  No      No Known Allergies    Current Outpatient Prescriptions   Medication  Sig  Dispense  Refill   .  diphenhydramine-acetaminophen (TYLENOL PM) 25-500 MG TABS  Take 1 tablet by mouth at bedtime as needed.         Marland Kitchen  LISINOPRIL-HYDROCHLOROTHIAZIDE PO  Take 25 mg by mouth every morning.            Review of Systems   Constitutional: Positive for fatigue. Negative for fever, chills and unexpected weight change.  HENT: Negative for hearing loss, congestion, sore throat, trouble swallowing and voice change.   Eyes: Negative for visual disturbance.  Respiratory: Negative for cough and wheezing.   Cardiovascular: Negative for chest pain, palpitations and leg swelling.  Gastrointestinal: Positive for diarrhea. Negative for nausea, vomiting, abdominal pain, constipation, blood in stool, abdominal distention and anal bleeding.  Genitourinary: Negative for hematuria, vaginal bleeding and difficulty urinating.  Musculoskeletal: Negative for arthralgias.  Skin: Negative for rash and wound.  Neurological: Negative for seizures, syncope and headaches.  Hematological: Negative for adenopathy. Does not bruise/bleed easily.  Psychiatric/Behavioral: Negative for confusion. The patient is  nervous/anxious.     Blood pressure 142/86, pulse 78, temperature 98.6 F (37 C), resp. rate 16, height 5\' 8"  (1.727 m), weight 311 lb 12.8 oz (141.432 kg).   Physical Exam   Constitutional: She is oriented to person, place, and time. She appears well-developed and well-nourished. No distress.       Weight 311 lbs.  HENT:   Head: Normocephalic and atraumatic.   Nose: Nose normal.   Mouth/Throat: No oropharyngeal exudate.  Eyes: Conjunctivae normal and EOM are normal. Pupils are equal, round, and reactive to light. Left eye exhibits no discharge. No scleral icterus.  Neck: Neck supple. No JVD present. No tracheal deviation present. No thyromegaly present.  Cardiovascular: Normal rate, regular rhythm, normal heart sounds and intact distal pulses.    No murmur heard. Pulmonary/Chest: Effort normal and breath sounds normal. No respiratory distress. She has no wheezes. She has no rales. She exhibits no tenderness.       Bilateral breast exam reveals that breasts are very large. No palpable mass. No pathologic skin change. No axillary adenopathy.  Abdominal: Soft. Bowel sounds are normal. She exhibits no distension and no mass. There is no tenderness. There is no rebound and no guarding.       Midline scar. No hernia. Liver and spleen not palpable. Morbidly obese.  Musculoskeletal: She exhibits no edema and no tenderness.  Lymphadenopathy:    She has no cervical adenopathy.  Neurological: She is alert and oriented to person, place, and time. She exhibits normal muscle tone. Coordination normal.  Skin: Skin is warm. No rash noted. She is not diaphoretic. No erythema. No pallor.  Psychiatric: She has a normal mood and affect. Her behavior is normal. Judgment and thought content normal.    Data Reviewed Recent office note from Dr. Marin Olp  regarding carcinoid tumor. Imaging studies. Pathology report.   Assessment Invasive ductal carcinoma left breast, upper outer quadrant, triple negative  breast cancer. This is either T1C or small T2 tumor.   Carcinoid tumor of intestine, metastatic to liver   Carcinoid syndrome   Morbid obesity   Hypertension   Plan We talked a long time about management of her breast cancer. She is strongly motivated for breast conservation, and I agree that would be in her best interest considering the size of her breast and her comorbidities.   Because of she is triple negative breast cancer I spoke with Helayne Seminole, PA at   Dr. Antonieta Pert office about chemotherapy and need for Port-A-Cath.  She says that she will probably be offered chemotherapy and that we should put a Port-A-Cath in. She is going to arrange for the patient to see Dr. Marin Olp early next week, preop   The patient will be scheduled for left partial mastectomy with needle localization, left axillary sentinel liver biopsy, insertion of Port-A-Cath in the near future.   I discussed the indications, details, techniques and numerous risk of the surgery with her. She understands these issues. She agrees with this plan. Her questions are answered.       Edsel Petrin. Dalbert Batman, M.D., [facility] General and Minimally invasive Surgery Breast and Colorectal Surgery Office:   [phone] Pager:   [phone]

## 2012-08-23 ENCOUNTER — Ambulatory Visit (HOSPITAL_COMMUNITY)
Admission: RE | Admit: 2012-08-23 | Discharge: 2012-08-23 | Disposition: A | Discharge status: Home / Self Care | Payer: Medicare Other | Source: Ambulatory Visit | Attending: General Surgery | Admitting: General Surgery

## 2012-08-23 ENCOUNTER — Encounter (HOSPITAL_COMMUNITY)
Admission: RE | Disposition: A | Discharge status: Home / Self Care | Payer: Self-pay | Source: Ambulatory Visit | Attending: General Surgery

## 2012-08-23 ENCOUNTER — Ambulatory Visit (HOSPITAL_COMMUNITY): Payer: Medicare Other | Admitting: Vascular Surgery

## 2012-08-23 ENCOUNTER — Ambulatory Visit (HOSPITAL_COMMUNITY): Payer: Medicare Other

## 2012-08-23 ENCOUNTER — Encounter (HOSPITAL_COMMUNITY)
Admission: RE | Admit: 2012-08-23 | Discharge: 2012-08-23 | Disposition: A | Discharge status: Home / Self Care | Payer: Medicare Other | Source: Ambulatory Visit | Attending: General Surgery | Admitting: General Surgery

## 2012-08-23 ENCOUNTER — Ambulatory Visit
Admission: RE | Admit: 2012-08-23 | Discharge: 2012-08-23 | Disposition: A | Discharge status: Home / Self Care | Payer: Medicare Other | Source: Ambulatory Visit | Attending: General Surgery | Admitting: General Surgery

## 2012-08-23 ENCOUNTER — Encounter (HOSPITAL_COMMUNITY): Payer: Self-pay | Admitting: Vascular Surgery

## 2012-08-23 ENCOUNTER — Other Ambulatory Visit (INDEPENDENT_AMBULATORY_CARE_PROVIDER_SITE_OTHER): Payer: Self-pay | Admitting: General Surgery

## 2012-08-23 ENCOUNTER — Telehealth (INDEPENDENT_AMBULATORY_CARE_PROVIDER_SITE_OTHER): Payer: Self-pay | Admitting: General Surgery

## 2012-08-23 DIAGNOSIS — C50419 Malignant neoplasm of upper-outer quadrant of unspecified female breast: Secondary | ICD-10-CM | POA: Insufficient documentation

## 2012-08-23 DIAGNOSIS — I10 Essential (primary) hypertension: Secondary | ICD-10-CM | POA: Insufficient documentation

## 2012-08-23 DIAGNOSIS — D0590 Unspecified type of carcinoma in situ of unspecified breast: Secondary | ICD-10-CM

## 2012-08-23 DIAGNOSIS — Z6841 Body Mass Index (BMI) 40.0 and over, adult: Secondary | ICD-10-CM | POA: Insufficient documentation

## 2012-08-23 HISTORY — PX: PARTIAL MASTECTOMY WITH NEEDLE LOCALIZATION AND AXILLARY SENTINEL LYMPH NODE BX: SHX6009

## 2012-08-23 HISTORY — PX: BREAST SURGERY: SHX581

## 2012-08-23 HISTORY — PX: PORTACATH PLACEMENT: SHX2246

## 2012-08-23 SURGERY — PARTIAL MASTECTOMY WITH NEEDLE LOCALIZATION AND AXILLARY SENTINEL LYMPH NODE BX
Anesthesia: General | Site: Chest | Laterality: Right | Wound class: Clean

## 2012-08-23 MED ORDER — ROCURONIUM BROMIDE 100 MG/10ML IV SOLN
INTRAVENOUS | Status: DC | PRN
  Administered 2012-08-23: 15 mg via INTRAVENOUS
  Administered 2012-08-23: 35 mg via INTRAVENOUS
  Administered 2012-08-23: 10 mg via INTRAVENOUS

## 2012-08-23 MED ORDER — HYDROMORPHONE HCL PF 1 MG/ML IJ SOLN
0.2500 mg | INTRAMUSCULAR | Status: DC | PRN
  Administered 2012-08-23 (×4): 0.5 mg via INTRAVENOUS

## 2012-08-23 MED ORDER — OXYCODONE HCL 5 MG PO TABS
ORAL_TABLET | ORAL | Status: AC
  Filled 2012-08-23: qty 1

## 2012-08-23 MED ORDER — SODIUM CHLORIDE 0.9 % IV SOLN: 250.0000 mL | INTRAVENOUS | Status: DC | PRN

## 2012-08-23 MED ORDER — SODIUM CHLORIDE 0.9 % IJ SOLN
INTRAMUSCULAR | Status: DC | PRN
  Administered 2012-08-23: 12:00:00

## 2012-08-23 MED ORDER — MIDAZOLAM HCL 5 MG/5ML IJ SOLN
INTRAMUSCULAR | Status: DC | PRN
  Administered 2012-08-23: 2 mg via INTRAVENOUS

## 2012-08-23 MED ORDER — ACETAMINOPHEN 650 MG RE SUPP: 650.0000 mg | RECTAL | Status: DC | PRN

## 2012-08-23 MED ORDER — HEPARIN SOD (PORK) LOCK FLUSH 100 UNIT/ML IV SOLN
INTRAVENOUS | Status: DC | PRN
  Administered 2012-08-23: 500 [IU] via INTRAVENOUS

## 2012-08-23 MED ORDER — BUPIVACAINE-EPINEPHRINE (PF) 0.5% -1:200000 IJ SOLN
INTRAMUSCULAR | Status: AC
  Filled 2012-08-23: qty 10

## 2012-08-23 MED ORDER — SODIUM CHLORIDE 0.9 % IJ SOLN: 3.0000 mL | INTRAMUSCULAR | Status: DC | PRN

## 2012-08-23 MED ORDER — HYDROMORPHONE HCL PF 1 MG/ML IJ SOLN
INTRAMUSCULAR | Status: AC
  Filled 2012-08-23: qty 1

## 2012-08-23 MED ORDER — LIDOCAINE HCL (CARDIAC) 20 MG/ML IV SOLN
INTRAVENOUS | Status: DC | PRN
  Administered 2012-08-23: 100 mg via INTRAVENOUS

## 2012-08-23 MED ORDER — METHYLENE BLUE 1 % INJ SOLN
INTRAMUSCULAR | Status: AC
  Filled 2012-08-23: qty 10

## 2012-08-23 MED ORDER — ARTIFICIAL TEARS OP OINT
TOPICAL_OINTMENT | OPHTHALMIC | Status: DC | PRN
  Administered 2012-08-23: 1 via OPHTHALMIC

## 2012-08-23 MED ORDER — HEPARIN SOD (PORK) LOCK FLUSH 100 UNIT/ML IV SOLN
INTRAVENOUS | Status: AC
  Filled 2012-08-23: qty 5

## 2012-08-23 MED ORDER — SODIUM CHLORIDE 0.9 % IV SOLN: INTRAVENOUS | Status: DC

## 2012-08-23 MED ORDER — MORPHINE SULFATE 2 MG/ML IJ SOLN: 2.0000 mg | INTRAMUSCULAR | Status: DC | PRN

## 2012-08-23 MED ORDER — BUPIVACAINE-EPINEPHRINE PF 0.5-1:200000 % IJ SOLN
INTRAMUSCULAR | Status: DC | PRN
  Administered 2012-08-23: 30 mL

## 2012-08-23 MED ORDER — LIDOCAINE-EPINEPHRINE 1 %-1:100000 IJ SOLN
INTRAMUSCULAR | Status: AC
  Filled 2012-08-23: qty 2

## 2012-08-23 MED ORDER — LACTATED RINGERS IV SOLN
INTRAVENOUS | Status: DC | PRN
  Administered 2012-08-23: 12:00:00 via INTRAVENOUS

## 2012-08-23 MED ORDER — OXYCODONE HCL 5 MG PO TABS
5.0000 mg | ORAL_TABLET | ORAL | Status: DC | PRN
  Administered 2012-08-23: 5 mg via ORAL

## 2012-08-23 MED ORDER — ETOMIDATE 2 MG/ML IV SOLN
INTRAVENOUS | Status: DC | PRN
  Administered 2012-08-23: 20 mg via INTRAVENOUS

## 2012-08-23 MED ORDER — FENTANYL CITRATE 0.05 MG/ML IJ SOLN
INTRAMUSCULAR | Status: DC | PRN
  Administered 2012-08-23: 50 ug via INTRAVENOUS
  Administered 2012-08-23: 100 ug via INTRAVENOUS
  Administered 2012-08-23: 50 ug via INTRAVENOUS
  Administered 2012-08-23: 150 ug via INTRAVENOUS
  Administered 2012-08-23: 50 ug via INTRAVENOUS

## 2012-08-23 MED ORDER — NEOSTIGMINE METHYLSULFATE 1 MG/ML IJ SOLN
INTRAMUSCULAR | Status: DC | PRN
  Administered 2012-08-23: 5 mg via INTRAVENOUS

## 2012-08-23 MED ORDER — ONDANSETRON HCL 4 MG/2ML IJ SOLN: 4.0000 mg | Freq: Once | INTRAMUSCULAR | Status: DC | PRN

## 2012-08-23 MED ORDER — GLYCOPYRROLATE 0.2 MG/ML IJ SOLN
INTRAMUSCULAR | Status: DC | PRN
  Administered 2012-08-23: .6 mg via INTRAVENOUS

## 2012-08-23 MED ORDER — ACETAMINOPHEN 325 MG PO TABS: 650.0000 mg | ORAL_TABLET | ORAL | Status: DC | PRN

## 2012-08-23 MED ORDER — ONDANSETRON HCL 4 MG/2ML IJ SOLN
4.0000 mg | Freq: Four times a day (QID) | INTRAMUSCULAR | Status: DC | PRN
  Filled 2012-08-23: qty 2

## 2012-08-23 MED ORDER — ONDANSETRON HCL 4 MG/2ML IJ SOLN
INTRAMUSCULAR | Status: DC | PRN
  Administered 2012-08-23: 4 mg via INTRAVENOUS

## 2012-08-23 MED ORDER — SODIUM CHLORIDE 0.9 % IR SOLN
Status: DC | PRN
  Administered 2012-08-23: 12:00:00

## 2012-08-23 MED ORDER — TECHNETIUM TC 99M SULFUR COLLOID FILTERED
1.0000 | Freq: Once | INTRAVENOUS | Status: AC | PRN
Start: 2012-08-23 — End: 2012-08-23
  Administered 2012-08-23: 1 via INTRADERMAL

## 2012-08-23 MED ORDER — HYDROCODONE-ACETAMINOPHEN 5-325 MG PO TABS: 1.0000 | ORAL_TABLET | ORAL | Status: DC | PRN

## 2012-08-23 MED ORDER — 0.9 % SODIUM CHLORIDE (POUR BTL) OPTIME
TOPICAL | Status: DC | PRN
  Administered 2012-08-23 (×2): 1000 mL

## 2012-08-23 MED ORDER — SODIUM CHLORIDE 0.9 % IJ SOLN: 3.0000 mL | Freq: Two times a day (BID) | INTRAMUSCULAR | Status: DC

## 2012-08-23 SURGICAL SUPPLY — 91 items
ADH SKN CLS APL DERMABOND .7 (GAUZE/BANDAGES/DRESSINGS) ×6
APL SKNCLS STERI-STRIP NONHPOA (GAUZE/BANDAGES/DRESSINGS) ×4
APPLIER CLIP 9.375 MED OPEN (MISCELLANEOUS) ×3
APR CLP MED 9.3 20 MLT OPN (MISCELLANEOUS) ×2
BAG DECANTER FOR FLEXI CONT (MISCELLANEOUS) ×3 IMPLANT
BENZOIN TINCTURE PRP APPL 2/3 (GAUZE/BANDAGES/DRESSINGS) ×4 IMPLANT
BINDER BREAST LRG (GAUZE/BANDAGES/DRESSINGS) IMPLANT
BINDER BREAST XLRG (GAUZE/BANDAGES/DRESSINGS) IMPLANT
BLADE SURG 10 STRL SS (BLADE) ×2 IMPLANT
BLADE SURG 15 STRL LF DISP TIS (BLADE) ×2 IMPLANT
BLADE SURG 15 STRL SS (BLADE)
BLADE SURG ROTATE 9660 (MISCELLANEOUS) IMPLANT
CANISTER SUCTION 2500CC (MISCELLANEOUS) ×3 IMPLANT
CHLORAPREP W/TINT 26ML (MISCELLANEOUS) ×4 IMPLANT
CLIP APPLIE 9.375 MED OPEN (MISCELLANEOUS) ×2 IMPLANT
CLOTH BEACON ORANGE TIMEOUT ST (SAFETY) ×3 IMPLANT
CONT SPEC 4OZ CLIKSEAL STRL BL (MISCELLANEOUS) ×3 IMPLANT
COVER MAYO STAND STRL (DRAPES) ×1 IMPLANT
COVER PROBE W GEL 5X96 (DRAPES) ×3 IMPLANT
COVER SURGICAL LIGHT HANDLE (MISCELLANEOUS) ×3 IMPLANT
COVER TRANSDUCER ULTRASND GEL (DRAPE) ×1 IMPLANT
CRADLE DONUT ADULT HEAD (MISCELLANEOUS) ×3 IMPLANT
DECANTER SPIKE VIAL GLASS SM (MISCELLANEOUS) ×3 IMPLANT
DERMABOND ADVANCED (GAUZE/BANDAGES/DRESSINGS) ×3
DERMABOND ADVANCED .7 DNX12 (GAUZE/BANDAGES/DRESSINGS) ×2 IMPLANT
DEVICE DUBIN SPECIMEN MAMMOGRA (MISCELLANEOUS) ×3 IMPLANT
DRAIN CHANNEL 19F RND (DRAIN) IMPLANT
DRAPE C-ARM 42X72 X-RAY (DRAPES) ×3 IMPLANT
DRAPE LAPAROSCOPIC ABDOMINAL (DRAPES) ×4 IMPLANT
DRAPE UTILITY 15X26 W/TAPE STR (DRAPE) ×4 IMPLANT
ELECT CAUTERY BLADE 6.4 (BLADE) ×4 IMPLANT
ELECT REM PT RETURN 9FT ADLT (ELECTROSURGICAL) ×3
ELECTRODE REM PT RTRN 9FT ADLT (ELECTROSURGICAL) ×2 IMPLANT
EVACUATOR SILICONE 100CC (DRAIN) IMPLANT
GAUZE SPONGE 4X4 16PLY XRAY LF (GAUZE/BANDAGES/DRESSINGS) ×2 IMPLANT
GLOVE BIO SURGEON STRL SZ7.5 (GLOVE) ×1 IMPLANT
GLOVE BIOGEL PI IND STRL 6.5 (GLOVE) IMPLANT
GLOVE BIOGEL PI IND STRL 7.0 (GLOVE) IMPLANT
GLOVE BIOGEL PI IND STRL 7.5 (GLOVE) IMPLANT
GLOVE BIOGEL PI IND STRL 8 (GLOVE) IMPLANT
GLOVE BIOGEL PI INDICATOR 6.5 (GLOVE) ×1
GLOVE BIOGEL PI INDICATOR 7.0 (GLOVE) ×1
GLOVE BIOGEL PI INDICATOR 7.5 (GLOVE) ×3
GLOVE BIOGEL PI INDICATOR 8 (GLOVE) ×1
GLOVE EUDERMIC 7 POWDERFREE (GLOVE) ×4 IMPLANT
GOWN PREVENTION PLUS XLARGE (GOWN DISPOSABLE) ×4 IMPLANT
GOWN STRL NON-REIN LRG LVL3 (GOWN DISPOSABLE) ×5 IMPLANT
INTRODUCER 13FR (MISCELLANEOUS) IMPLANT
INTRODUCER COOK 11FR (CATHETERS) IMPLANT
KIT BASIN OR (CUSTOM PROCEDURE TRAY) ×3 IMPLANT
KIT MARKER MARGIN INK (KITS) ×3 IMPLANT
KIT PORT POWER 8FR ISP CVUE (Catheter) IMPLANT
KIT PORT POWER 9.6FR MRI PREA (Catheter) IMPLANT
KIT PORT POWER ISP 8FR (Catheter) IMPLANT
KIT POWER CATH 8FR (Catheter) ×1 IMPLANT
KIT ROOM TURNOVER OR (KITS) ×3 IMPLANT
NDL 18GX1X1/2 (RX/OR ONLY) (NEEDLE) ×2 IMPLANT
NDL HYPO 25GX1X1/2 BEV (NEEDLE) ×4 IMPLANT
NEEDLE 18GX1X1/2 (RX/OR ONLY) (NEEDLE) ×3 IMPLANT
NEEDLE HYPO 25GX1X1/2 BEV (NEEDLE) ×6 IMPLANT
NS IRRIG 1000ML POUR BTL (IV SOLUTION) ×3 IMPLANT
PACK GENERAL/GYN (CUSTOM PROCEDURE TRAY) ×3 IMPLANT
PACK SURGICAL SETUP 50X90 (CUSTOM PROCEDURE TRAY) ×2 IMPLANT
PAD ARMBOARD 7.5X6 YLW CONV (MISCELLANEOUS) ×3 IMPLANT
PENCIL BUTTON HOLSTER BLD 10FT (ELECTRODE) ×3 IMPLANT
SET INTRODUCER 12FR PACEMAKER (SHEATH) IMPLANT
SET SHEATH INTRODUCER 10FR (MISCELLANEOUS) IMPLANT
SHEATH COOK PEEL AWAY SET 9F (SHEATH) IMPLANT
SLEEVE SURGEON STRL (DRAPES) ×1 IMPLANT
SPONGE GAUZE 4X4 12PLY (GAUZE/BANDAGES/DRESSINGS) ×2 IMPLANT
SPONGE LAP 4X18 X RAY DECT (DISPOSABLE) ×3 IMPLANT
STAPLER VISISTAT 35W (STAPLE) ×1 IMPLANT
STRIP CLOSURE SKIN 1/2X4 (GAUZE/BANDAGES/DRESSINGS) ×2 IMPLANT
SURGILUBE 3G PEEL PACK STRL (MISCELLANEOUS) IMPLANT
SUT ETHILON 3 0 FSL (SUTURE) IMPLANT
SUT MNCRL AB 4-0 PS2 18 (SUTURE) ×4 IMPLANT
SUT PROLENE 2 0 CT2 30 (SUTURE) ×3 IMPLANT
SUT SILK 2 0 SH (SUTURE) ×3 IMPLANT
SUT VIC AB 3-0 SH 18 (SUTURE) ×4 IMPLANT
SUT VIC AB 3-0 SH 27 (SUTURE) ×3
SUT VIC AB 3-0 SH 27XBRD (SUTURE) ×2 IMPLANT
SUT VICRYL AB 3 0 TIES (SUTURE) IMPLANT
SYR 20ML ECCENTRIC (SYRINGE) ×6 IMPLANT
SYR 5ML LUER SLIP (SYRINGE) ×3 IMPLANT
SYR BULB 3OZ (MISCELLANEOUS) ×2 IMPLANT
SYR CONTROL 10ML LL (SYRINGE) ×6 IMPLANT
SYRINGE 10CC LL (SYRINGE) ×2 IMPLANT
TOWEL OR 17X24 6PK STRL BLUE (TOWEL DISPOSABLE) ×4 IMPLANT
TOWEL OR 17X26 10 PK STRL BLUE (TOWEL DISPOSABLE) ×3 IMPLANT
TUBE CONNECTING 12X1/4 (SUCTIONS) IMPLANT
YANKAUER SUCT BULB TIP NO VENT (SUCTIONS) IMPLANT

## 2012-08-23 NOTE — Anesthesia Postprocedure Evaluation (Signed)
Anesthesia Post Note  Patient: Amber Bautista  Procedure(s) Performed: Procedure(s) (LRB): PARTIAL MASTECTOMY WITH NEEDLE LOCALIZATION AND AXILLARY SENTINEL LYMPH NODE BX (Left) INSERTION PORT-A-CATH (Right)  Anesthesia type: general  Patient location: PACU  Post pain: Pain level controlled  Post assessment: Patient's Cardiovascular Status Stable  Last Vitals:  Filed Vitals:   08/23/12 1515  BP: 189/93  Pulse:   Temp:   Resp:     Post vital signs: Reviewed and stable  Level of consciousness: sedated  Complications: No apparent anesthesia complications

## 2012-08-23 NOTE — Anesthesia Preprocedure Evaluation (Addendum)
Anesthesia Evaluation  Patient identified by MRN, date of birth, ID band Patient awake    Reviewed: Allergy & Precautions, H&P , NPO status , Patient's Chart, lab work & pertinent test results  Airway Mallampati: I TM Distance: >3 FB Neck ROM: full    Dental   Pulmonary          Cardiovascular hypertension, Pt. on medications Rhythm:regular Rate:Normal     Neuro/Psych    GI/Hepatic GERD-  ,  Endo/Other  Morbid obesity  Renal/GU      Musculoskeletal   Abdominal   Peds  Hematology   Anesthesia Other Findings   Reproductive/Obstetrics                          Anesthesia Physical Anesthesia Plan  ASA: II  Anesthesia Plan: General   Post-op Pain Management:    Induction: Intravenous  Airway Management Planned: Oral ETT  Additional Equipment:   Intra-op Plan:   Post-operative Plan: Extubation in OR  Informed Consent: I have reviewed the patients History and Physical, chart, labs and discussed the procedure including the risks, benefits and alternatives for the proposed anesthesia with the patient or authorized representative who has indicated his/her understanding and acceptance.     Plan Discussed with: CRNA, Anesthesiologist and Surgeon  Anesthesia Plan Comments:         Anesthesia Quick Evaluation

## 2012-08-23 NOTE — OR Nursing (Signed)
1320: Re-prepped and re-drapped for 2nd part of procedure.

## 2012-08-23 NOTE — Transfer of Care (Signed)
Immediate Anesthesia Transfer of Care Note  Patient: Amber Bautista  Procedure(s) Performed: Procedure(s) (LRB) with comments: PARTIAL MASTECTOMY WITH NEEDLE LOCALIZATION AND AXILLARY SENTINEL LYMPH NODE BX (Left) - blue dye injection  INSERTION PORT-A-CATH (Right) - intraop ultrasound  Patient Location: PACU  Anesthesia Type:General  Level of Consciousness: awake, oriented and patient cooperative  Airway & Oxygen Therapy: Patient Spontanous Breathing and Patient connected to face mask  Post-op Assessment: Report given to PACU RN and Patient moving all extremities X 4  Post vital signs: Reviewed and stable  Complications: No apparent anesthesia complications

## 2012-08-23 NOTE — Interval H&P Note (Signed)
History and Physical Interval Note:  08/23/2012 11:35 AM  [place]  has presented today for surgery, with the diagnosis of left breast cancer  The goals and the various methods of treatment have been discussed with the patient and family. After consideration of risks, benefits and other options for treatment, the patient has consented to  Procedure(s) (LRB) with comments: PARTIAL MASTECTOMY WITH NEEDLE LOCALIZATION AND AXILLARY SENTINEL LYMPH NODE BX (Left) - left partial mastectomy with needle localization, sentinel lymph node biopsy INSERTION PORT-A-CATH (N/A) as a surgical intervention .  The patient's history has been reviewed, patient examined today , no change in status, stable for surgery.  I have reviewed the patient's chart and labs.  Questions were answered to the patient's satisfaction.     Adin Hector

## 2012-08-23 NOTE — Op Note (Signed)
Patient Name:           Amber Bautista   Date of Surgery:        08/23/2012  Pre op Diagnosis:      Invasive ductal carcinoma left breast, upper outer quadrant, clinical stage T2, N0  Post op Diagnosis:    Same  Procedure:                  Insertion of 8 French power port venous vascular access device Using fluoroscopy for guidance and positioning of Port-A-Cath Ultrasound guided vena puncture of the internal jugular vein Inject blue dye  left breast Left partial mastectomy with needle localization Left axillary sentinel node biopsy  Surgeon:                     Edsel Petrin. Dalbert Batman, M.D., FACS  Assistant:                      ORT  Operative Indications:    Amber Bautista is a 41 y.o. female. She is referred by Dr. Conchita Paris at the breast center of [facility] for evaluation of a new cancer of the left breast. She is followed by Burney Gauze for metastatic carcinoid tumor.  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 She is on Sandostatin because of multiple liver metastasis. She was admitted to [place] [facility] because of abdominal pain and imaging studies revealed a 1.7 cm mass in the upper-outer quadrant of the left breast. She was discharged and referred to the breast center of [facility] by Dr. Antonieta Pert office. Imaging studies and image guided biopsy of this upper-outer quadrant mass on the left showed invasive ductal carcinoma, triple negative breast cancer, positive LVI. MRI shows a solitary 2 cm mass in the upper outer quadrant left breast. Otherwise negative. She has been examined and counseled as an outpatient and is brought to the operating room electively Past history is significant for morbid obesity. She was undergoing surgery for a bariatric procedure in [place] in 2001 the surgeon discovered a carcinoid tumor of the intestine. They abandoned the bariatric surgery performed an intestinal resection. Details unknown.  Liver metastasis were discovered in 2007. She has problems with diarrhea and takes Sandostatin which only partially controls this. She is followed by Dr. Burney Gauze. She remains morbidly obese with BMI 48.   She has hypertension.  Operative Findings:       The right internal jugular vein was accessed with a single venipuncture  using the ultrasound for guidance and the Port-A-Cath was placed through the right internal jugular vein. The left partial mastectomy specimen revealed the wire and the malignant density to be in the center of the specimen and it looked like we had good margins. I found 3 sentinel lymph nodes.  Procedure in Detail:          The patient underwent wire localization at the breast center  Of [place]. The wire was well placed from a lateral to medial position going through the mass and about 4 cm beyond the mass. The patient was brought to the holding area at [place] and  the nuclear medicine technician injected radionuclide into the left breast. The patient was brought to the operating room. Gen. Anesthesia was induced. Intravenous antibiotics were given. Surgical time out was performed.  I first injected 5 cc of blue dye into the left breast, subareolar area following  alcohol prep. We massaged the breast for about 5 minutes.  We then positioned the patient with her arms at her sides and a roll behind her shoulders and then prepped and draped the upper chest and neck. 0.5% Marcaine with epinephrine was used as a local infiltration anesthetic throughout all the procedures. The ultrasound was draped over the operative field. I could visualize the right internal jugular vein and right carotid artery. Right internal jugular venipuncture was performed with a single stick. Guidewire was threaded down through the venipuncture needle and venipuncture needle was removed. Fluoroscopy was used to see that the guidewire was going down through the atrium and turned to the left and into the  right ventricle. This was pulled back slightly . There was no ectopy. A small incision was made in the right neck. I then made a transverse incision in the right parasternal area about 3 cm below the clavicle. Subcutaneous pocket was created. Using a tunneling device I tunneled the 8 French catheter from the right neck wire insertion site to the right parasternal port pocket site. Using fluoroscopy I marked a template on the chest wall wheref the catheter tip should be near the right atrium and superior vena cava junction. This turned out to be about 28 cm in length and I cut the catheter at that point and secured it to the port with the locking device and flushed it with heparinized saline. The port was sutured to pectoralis fascia with 3 interrupted sutures of 2-0 Prolene. I then flushed the port and catheter one more time. With the patient back in Trendelenburg I inserted the dilator and peel-away sheath over the guidewire without difficulty. The wire and the dilator removed and I then threaded the catheter easily into the central venous circulation. I had excellent blood return and this flushed easily. This was flushed with concentrated heparin solution. Once again I used fluoroscopy and it appeared that the catheter was positioned well in the superior vena cava, was moving with the heartbeat and there was no deformity of the catheter anywhere along its course. The subcutaneous tissue was closed with 3-0 Vicryl sutures and the skin incisions closed with 4-0 Monocryl subcuticular sutures and Dermabond.  We then reprepped and redraped the patient prepping out the left breast the left axilla extensively. Another surgical time out was performed. I observed the guidewire in the left breast laterally. A transverse radial incision was made at this point. Dissection was carried down into the breast tissue widely around the localizing wire. The specimen was removed and marked with silk sutures and the 6 color ink kit  to orient  the pathologist. Specimen mammogram looked good and it looked like we had a good margin. Specimen was sent to the pathology lab. Wound was irrigated with saline. Hemostasis was excellent. Metal clips were placed in the lumpectomy cavity and the breast tissue was closed with multiple layers of 3-0 Vicryl sutures and the skin with a running subcuticular suture of 4-0 Monocryl and Dermabond.  A  transverse incision was made in the left axilla at the hairline. Using the neoprobe I dissected down into the axilla. Using th e neoprobe I identified and removed 3 sentinel lymph nodes. These all had strongly positive radioactivity and 2 of the 3 had the blue dye.   After this was done there was no other signal in the axilla. Hemostasis was excellent and achieved electrocautery. The soft tissues and axilla were closed with 3-0 Vicryl sutures and the skin closed with  a  running  subcuticular suture of 4-0 Monocryl and Dermabond. Patient tolerated procedure well taken to recovery in stable condition. EBL 40 cc. Counts correct. Complications none.     Edsel Petrin. Dalbert Batman, M.D., FACS General and Minimally Invasive Surgery Breast and Colorectal Surgery  08/23/2012 2:33 PM

## 2012-08-23 NOTE — Telephone Encounter (Signed)
She called saying that the vicodin was not working for her.  I explained that I cannot call in anything stronger than this and she would need to go to ER for a written prescription if she needed more.  She can take motrin q8hours between doses or with doses for additional pain control if desired.

## 2012-08-23 NOTE — Preoperative (Signed)
Beta Blockers   Reason not to administer Beta Blockers:Not Applicable 

## 2012-08-23 NOTE — Anesthesia Procedure Notes (Signed)
Procedure Name: Intubation Date/Time: 08/23/2012 12:24 PM Performed by: Sherilyn Banker Pre-anesthesia Checklist: Patient identified, Emergency Drugs available, Suction available, Patient being monitored and Timeout performed Patient Re-evaluated:Patient Re-evaluated prior to inductionOxygen Delivery Method: Circle system utilized Preoxygenation: Pre-oxygenation with 100% oxygen Intubation Type: IV induction Ventilation: Mask ventilation without difficulty Laryngoscope Size: Mac and 3 Tube size: 7.5 mm Number of attempts: 1 Airway Equipment and Method: Stylet Placement Confirmation: ETT inserted through vocal cords under direct vision,  positive ETCO2 and breath sounds checked- equal and bilateral Secured at: 23 cm Tube secured with: Tape Dental Injury: Teeth and Oropharynx as per pre-operative assessment

## 2012-08-23 NOTE — OR Nursing (Signed)
1435: patient moved to PACU stretcher assisted by S. Ouida Sills, CRNA;  C.Bgielow, RN;  Junious Silk, ST; Caryl Asp, RN and Jerilynn Mages. Cavanaugh,RN.  Specimen time-out by Leslie Andrea, ST and M. Gerome Apley, RN  .

## 2012-08-26 ENCOUNTER — Encounter (HOSPITAL_COMMUNITY): Payer: Self-pay | Admitting: General Surgery

## 2012-08-26 ENCOUNTER — Telehealth (INDEPENDENT_AMBULATORY_CARE_PROVIDER_SITE_OTHER): Payer: Self-pay | Admitting: General Surgery

## 2012-08-26 NOTE — Telephone Encounter (Signed)
I called patient tonight, and she states she is doing well from the surgery.  I discussed pathology with her: 2.1 cm. Tumor plus DCIS                                                    1/3 nodes with micromet                                                     TNBC                                                     Stage T2, N75mic  She will follow up with me Dec. 2 and also has appt. With Dr. Marin Olp.   Edsel Petrin. Dalbert Batman, M.D., [facility] General and Minimally invasive Surgery Breast and Colorectal Surgery Office:   [phone] Pager:   [phone]

## 2012-08-27 NOTE — Progress Notes (Signed)
error 

## 2012-09-02 ENCOUNTER — Telehealth: Payer: Self-pay | Admitting: Hematology & Oncology

## 2012-09-02 NOTE — Telephone Encounter (Signed)
Per patients mom pt is at [facility] she was in a lot of pain, weak and vomiting. Pt cancelled 11-26 appointment Dr. Marin Olp aware.

## 2012-09-02 NOTE — Telephone Encounter (Signed)
Patient called and cx 09-03-12 apt due to being in the hospital.  She stated she would call back to resch when she is discharged from the hospital

## 2012-09-03 ENCOUNTER — Ambulatory Visit: Payer: Medicare Other | Admitting: Hematology & Oncology

## 2012-09-03 ENCOUNTER — Other Ambulatory Visit: Payer: Self-pay | Admitting: Lab

## 2012-09-03 ENCOUNTER — Telehealth: Payer: Self-pay | Admitting: Hematology & Oncology

## 2012-09-03 ENCOUNTER — Ambulatory Visit: Payer: Self-pay | Admitting: Hematology & Oncology

## 2012-09-03 ENCOUNTER — Other Ambulatory Visit: Payer: Medicare Other | Admitting: Lab

## 2012-09-03 NOTE — Telephone Encounter (Signed)
Pt called is still in the hospital. She is aware Dr. Marin Olp wants to start tx quickly. She said she would call as soon as she gets out. MD aware

## 2012-09-04 ENCOUNTER — Telehealth: Payer: Self-pay | Admitting: Hematology & Oncology

## 2012-09-04 NOTE — Telephone Encounter (Signed)
Pt called made 12-2 appointment MD aware

## 2012-09-09 ENCOUNTER — Other Ambulatory Visit (HOSPITAL_BASED_OUTPATIENT_CLINIC_OR_DEPARTMENT_OTHER): Payer: Medicare Other | Admitting: Lab

## 2012-09-09 ENCOUNTER — Ambulatory Visit (HOSPITAL_BASED_OUTPATIENT_CLINIC_OR_DEPARTMENT_OTHER): Payer: Medicare Other | Admitting: Hematology & Oncology

## 2012-09-09 ENCOUNTER — Encounter (INDEPENDENT_AMBULATORY_CARE_PROVIDER_SITE_OTHER): Payer: Medicare Other | Admitting: General Surgery

## 2012-09-09 VITALS — BP 139/84 | HR 90 | Temp 98.3°F | Resp 20 | Ht 68.0 in | Wt 313.0 lb

## 2012-09-09 DIAGNOSIS — C7A00 Malignant carcinoid tumor of unspecified site: Secondary | ICD-10-CM

## 2012-09-09 DIAGNOSIS — C773 Secondary and unspecified malignant neoplasm of axilla and upper limb lymph nodes: Secondary | ICD-10-CM

## 2012-09-09 DIAGNOSIS — C50419 Malignant neoplasm of upper-outer quadrant of unspecified female breast: Secondary | ICD-10-CM

## 2012-09-09 DIAGNOSIS — C787 Secondary malignant neoplasm of liver and intrahepatic bile duct: Secondary | ICD-10-CM

## 2012-09-09 DIAGNOSIS — D3A098 Benign carcinoid tumors of other sites: Secondary | ICD-10-CM

## 2012-09-09 LAB — CBC WITH DIFFERENTIAL (CANCER CENTER ONLY)
BASO#: 0 10*3/uL (ref 0.0–0.2)
EOS%: 6 % (ref 0.0–7.0)
HCT: 40.6 % (ref 34.8–46.6)
HGB: 12.7 g/dL (ref 11.6–15.9)
MCH: 27.3 pg (ref 26.0–34.0)
MCHC: 31.3 g/dL — ABNORMAL LOW (ref 32.0–36.0)
MCV: 87 fL (ref 81–101)
MONO%: 8.2 % (ref 0.0–13.0)
NEUT%: 63.2 % (ref 39.6–80.0)

## 2012-09-09 NOTE — Patient Instructions (Addendum)
Please review the following..  Docetaxel injection  What is this medicine? DOCETAXEL (doe se TAX el) is a chemotherapy drug. It targets fast dividing cells, like cancer cells, and causes these cells to die. This medicine is used to treat many types of cancers like breast cancer, certain stomach cancers, head and neck cancer, lung cancer, and prostate cancer. This medicine may be used for other purposes; ask your health care provider or pharmacist if you have questions.  What should I tell my health care provider before I take this medicine?  They need to know if you have any of these conditions: -infection (especially a virus infection such as chickenpox, cold sores, or herpes) -liver disease -low blood counts, like low white cell, platelet, or red cell counts -an unusual or allergic reaction to docetaxel, polysorbate 80, other chemotherapy agents, other medicines, foods, dyes, or preservatives -pregnant or trying to get pregnant -breast-feeding  How should I use this medicine?  This drug is given as an infusion into a vein. It is administered in a hospital or clinic by a specially trained health care professional. Talk to your pediatrician regarding the use of this medicine in children. Special care may be needed. Overdosage: If you think you have taken too much of this medicine contact a poison control center or emergency room at once. NOTE: This medicine is only for you. Do not share this medicine with others.  What if I miss a dose?  It is important not to miss your dose. Call your doctor or health care professional if you are unable to keep an appointment.  What may interact with this medicine?  -cyclosporine -erythromycin -ketoconazole -medicines to increase blood counts like filgrastim, pegfilgrastim, sargramostim -vaccines Talk to your doctor or health care professional before taking any of these  medicines: -acetaminophen -aspirin -ibuprofen -ketoprofen -naproxen This list may not describe all possible interactions. Give your health care provider a list of all the medicines, herbs, non-prescription drugs, or dietary supplements you use. Also tell them if you smoke, drink alcohol, or use illegal drugs. Some items may interact with your medicine.  What should I watch for while using this medicine?  Your condition will be monitored carefully while you are receiving this medicine. You will need important blood work done while you are taking this medicine. This drug may make you feel generally unwell. This is not uncommon, as chemotherapy can affect healthy cells as well as cancer cells. Report any side effects. Continue your course of treatment even though you feel ill unless your doctor tells you to stop. In some cases, you may be given additional medicines to help with side effects. Follow all directions for their use. Call your doctor or health care professional for advice if you get a fever, chills or sore throat, or other symptoms of a cold or flu. Do not treat yourself. This drug decreases your body's ability to fight infections. Try to avoid being around people who are sick. This medicine may increase your risk to bruise or bleed. Call your doctor or health care professional if you notice any unusual bleeding. Be careful brushing and flossing your teeth or using a toothpick because you may get an infection or bleed more easily. If you have any dental work done, tell your dentist you are receiving this medicine. Avoid taking products that contain aspirin, acetaminophen, ibuprofen, naproxen, or ketoprofen unless instructed by your doctor. These medicines may hide a fever. Do not become pregnant while taking this medicine. Women should inform  their doctor if they wish to become pregnant or think they might be pregnant. There is a potential for serious side effects to an unborn child. Talk to  your health care professional or pharmacist for more information. Do not breast-feed an infant while taking this medicine?  What side effects may I notice from receiving this medicine?  Side effects that you should report to your doctor or health care professional as soon as possible: -allergic reactions like skin rash, itching or hives, swelling of the face, lips, or tongue -low blood counts - This drug may decrease the number of white blood cells, red blood cells and platelets. You may be at increased risk for infections and bleeding. -signs of infection - fever or chills, cough, sore throat, pain or difficulty passing urine -signs of decreased platelets or bleeding - bruising, pinpoint red spots on the skin, black, tarry stools, nosebleeds -signs of decreased red blood cells - unusually weak or tired, fainting spells, lightheadedness -breathing problems -fast or irregular heartbeat -low blood pressure -mouth sores -nausea and vomiting -pain, swelling, redness or irritation at the injection site -pain, tingling, numbness in the hands or feet -swelling of the ankle, feet, hands -weight gain Side effects that usually do not require medical attention (report to your prescriber or health care professional if they continue or are bothersome): -bone pain -complete hair loss including hair on your head, underarms, pubic hair, eyebrows, and eyelashes -diarrhea -excessive tearing -changes in the color of fingernails -loosening of the fingernails -nausea -muscle pain -red flush to skin -sweating -weak or tired This list may not describe all possible side effects. Call your doctor for medical advice about side effects. You may report side effects to FDA at 1-800-FDA-1088.  NOTE: This sheet is a summary. It may not cover all possible information. If you have questions about this medicine, talk to your doctor, pharmacist, or health care provider.  2013, [employer]. (09/07/2008  11:52:10 AM)               Cisplatin injection What is this medicine?  CISPLATIN (SIS pla tin) is a chemotherapy drug. It targets fast dividing cells, like cancer cells, and causes these cells to die. This medicine is used to treat many types of cancer like bladder, ovarian, and testicular cancers. This medicine may be used for other purposes; ask your health care provider or pharmacist if you have questions.  What should I tell my health care provider before I take this medicine?  They need to know if you have any of these conditions: -blood disorders -hearing problems -kidney disease -recent or ongoing radiation therapy -an unusual or allergic reaction to cisplatin, carboplatin, other chemotherapy, other medicines, foods, dyes, or preservatives -pregnant or trying to get pregnant -breast-feeding  How should I use this medicine?  This drug is given as an infusion into a vein. It is administered in a hospital or clinic by a specially trained health care professional. Talk to your pediatrician regarding the use of this medicine in children. Special care may be needed. Overdosage: If you think you have taken too much of this medicine contact a poison control center or emergency room at once. NOTE: This medicine is only for you. Do not share this medicine with others.  What if I miss a dose?  It is important not to miss a dose. Call your doctor or health care professional if you are unable to keep an appointment. What may interact with this medicine? -dofetilide -foscarnet -medicines for seizures -  medicines to increase blood counts like filgrastim, pegfilgrastim, sargramostim -probenecid -pyridoxine used with altretamine -rituximab -some antibiotics like amikacin, gentamicin, neomycin, polymyxin B, streptomycin, tobramycin -sulfinpyrazone -vaccines -zalcitabine Talk to your doctor or health care professional before taking any of these  medicines: -acetaminophen -aspirin -ibuprofen -ketoprofen -naproxen This list may not describe all possible interactions. Give your health care provider a list of all the medicines, herbs, non-prescription drugs, or dietary supplements you use. Also tell them if you smoke, drink alcohol, or use illegal drugs. Some items may interact with your medicine.  What should I watch for while using this medicine?  Your condition will be monitored carefully while you are receiving this medicine. You will need important blood work done while you are taking this medicine. This drug may make you feel generally unwell. This is not uncommon, as chemotherapy can affect healthy cells as well as cancer cells. Report any side effects. Continue your course of treatment even though you feel ill unless your doctor tells you to stop. In some cases, you may be given additional medicines to help with side effects. Follow all directions for their use. Call your doctor or health care professional for advice if you get a fever, chills or sore throat, or other symptoms of a cold or flu. Do not treat yourself. This drug decreases your body's ability to fight infections. Try to avoid being around people who are sick. This medicine may increase your risk to bruise or bleed. Call your doctor or health care professional if you notice any unusual bleeding. Be careful brushing and flossing your teeth or using a toothpick because you may get an infection or bleed more easily. If you have any dental work done, tell your dentist you are receiving this medicine. Avoid taking products that contain aspirin, acetaminophen, ibuprofen, naproxen, or ketoprofen unless instructed by your doctor. These medicines may hide a fever. Do not become pregnant while taking this medicine. Women should inform their doctor if they wish to become pregnant or think they might be pregnant. There is a potential for serious side effects to an unborn child. Talk to  your health care professional or pharmacist for more information. Do not breast-feed an infant while taking this medicine. Drink fluids as directed while you are taking this medicine. This will help protect your kidneys. Call your doctor or health care professional if you get diarrhea. Do not treat yourself.  What side effects may I notice from receiving this medicine?  Side effects that you should report to your doctor or health care professional as soon as possible: -allergic reactions like skin rash, itching or hives, swelling of the face, lips, or tongue -signs of infection - fever or chills, cough, sore throat, pain or difficulty passing urine -signs of decreased platelets or bleeding - bruising, pinpoint red spots on the skin, black, tarry stools, nosebleeds -signs of decreased red blood cells - unusually weak or tired, fainting spells, lightheadedness -breathing problems -changes in hearing -gout pain -low blood counts - This drug may decrease the number of white blood cells, red blood cells and platelets. You may be at increased risk for infections and bleeding. -nausea and vomiting -pain, swelling, redness or irritation at the injection site -pain, tingling, numbness in the hands or feet -problems with balance, movement -trouble passing urine or change in the amount of urine Side effects that usually do not require medical attention (report to your doctor or health care professional if they continue or are bothersome): -changes in vision -loss  of appetite -metallic taste in the mouth or changes in taste This list may not describe all possible side effects. Call your doctor for medical advice about side effects. You may report side effects to FDA at 1-800-FDA-1088.

## 2012-09-09 NOTE — Progress Notes (Signed)
This office note has been dictated.

## 2012-09-10 ENCOUNTER — Other Ambulatory Visit: Payer: Self-pay | Admitting: Medical

## 2012-09-11 LAB — COMPREHENSIVE METABOLIC PANEL
Albumin: 4.2 g/dL (ref 3.5–5.2)
Alkaline Phosphatase: 83 U/L (ref 39–117)
BUN: 15 mg/dL (ref 6–23)
Glucose, Bld: 154 mg/dL — ABNORMAL HIGH (ref 70–99)
Potassium: 4 mEq/L (ref 3.5–5.3)
Total Bilirubin: 0.4 mg/dL (ref 0.3–1.2)

## 2012-09-11 NOTE — Progress Notes (Signed)
CC:   Amber Bautista. Amber Bautista, M.D. Amber Bautista. Amber Bautista, M.D.  DIAGNOSES: 1. Stage IIB (T2 N1 M0) infiltrating ductal carcinoma of the left     breast-triple negative. 2. Metastatic carcinoid.  CURRENT THERAPY:  Patient to start adjuvant chemotherapy with carboplatin/Taxotere this week.  INTERIM HISTORY:  Amber Bautista comes in for followup.  Unfortunately, she now has a localized breast cancer.  This was found while she was hospitalized.  She ultimately underwent lumpectomy.  Initially, a biopsy was done.  This showed that she had an invasive ductal carcinoma.  It is triple negative.  She underwent her lumpectomy on the 15th.  The lumpectomy report [id number]) showed a 2.1-cm tumor.  It was relatively high grade.  All margins were negative.  There was lymphovascular invasion.  There was 1 lymph node with malignancy.  As such, she is a stage IIB (T2 N1 M0) ductal carcinoma.  She did have a Port-A-Cath placed.  Surprisingly enough, we got an echocardiogram on her.  She has marginal cardiac function.  Ejection fraction was 25-30%.  There was some mitral regurgitation.  Left ventricle was severely dilated.  I am not sure as to why that is the case.  She has been asymptomatic.  She recently was in the [facility] with what sounds like food poisoning and gastroenteritis.  She is better from this.  She has had no bony pain.  She has had no diarrhea.  There has been no leg swelling.  There have been no rashes.  I have not done any additional studies on her as I felt that the risk of metastatic disease was low.  She does have metastases to the liver from her carcinoid.  PHYSICAL EXAMINATION:  General:  This is an obese Hispanic female in no obvious distress.  Vital signs:  Temperature 98.3, pulse 90, respiratory rate 20, blood pressure 139/84.  Weight is 313.  Head and neck: Normocephalic, atraumatic skull.  There are no ocular or oral lesions. There are no palpable cervical or  supraclavicular lymph nodes.  Lungs: Clear bilaterally.  Cardiac:  Regular rate and rhythm with a normal S1 and S2.  There are no murmurs, rubs, or bruits.  Abdomen:  Soft with good bowel sounds.  There is no fluid wave.  There is no palpable hepatosplenomegaly.  Breasts:  Right breast with no masses, edema, or erythema.  There is no right axillary adenopathy.  Left breast shows a healing lumpectomy at the 3 o'clock position.  There is some slight firmness of the lumpectomy site.  No distinct masses noted in the left breast.  There is no left axillary adenopathy.  Extremities:  No clubbing, cyanosis, or edema.  Neurological:  No focal neurological deficits.  LABORATORY STUDIES:  White cell count 12.1, hemoglobin 12.7, hematocrit 40.6, platelet count 415.  MCV is 87.  IMPRESSION:  Amber Bautista is a 41 year old Hispanic female with history of metastatic carcinoid.  She has been treated at [facility] for this.  She is on Sandostatin for this.  I will have to make sure that we continue her on the Sandostatin while she gets treated for the breast cancer.  I am going to set her up with a MUGA scan.  I just want to make sure that her cardiac function has not deteriorated further.  Again, I am not sure as why her cardiac status is marginal.  She has been asymptomatic with this.  Carcinoid affects the right side of the heart and not the left.  I feel that for triple-negative breast cancer, that carboplatin/Taxotere would be appropriate for her.  I think that 4 cycles would be appropriate.  Amber Bautista certainly has a good attitude.  She has been through quite a lot.  Hopefully, will get her through chemotherapy.  We are going to need Neulasta after each cycle of chemo.  We will have Amber Bautista come back to see [place] in 3 weeks, which would be her 2nd cycle of treatment.    ______________________________ Volanda Napoleon, M.D. PRE/MEDQ  D:  09/10/2012  T:  09/11/2012  Job:  [id number]

## 2012-09-12 ENCOUNTER — Other Ambulatory Visit: Payer: Self-pay | Admitting: *Deleted

## 2012-09-12 ENCOUNTER — Ambulatory Visit (HOSPITAL_BASED_OUTPATIENT_CLINIC_OR_DEPARTMENT_OTHER): Payer: Medicare Other

## 2012-09-12 VITALS — BP 188/96 | HR 62 | Temp 98.1°F | Resp 20

## 2012-09-12 DIAGNOSIS — C773 Secondary and unspecified malignant neoplasm of axilla and upper limb lymph nodes: Secondary | ICD-10-CM

## 2012-09-12 DIAGNOSIS — C50419 Malignant neoplasm of upper-outer quadrant of unspecified female breast: Secondary | ICD-10-CM

## 2012-09-12 DIAGNOSIS — Z5111 Encounter for antineoplastic chemotherapy: Secondary | ICD-10-CM

## 2012-09-12 DIAGNOSIS — D3A098 Benign carcinoid tumors of other sites: Secondary | ICD-10-CM

## 2012-09-12 DIAGNOSIS — C7A00 Malignant carcinoid tumor of unspecified site: Secondary | ICD-10-CM

## 2012-09-12 MED ORDER — OCTREOTIDE ACETATE 30 MG IM KIT: 30.0000 mg | PACK | Freq: Once | INTRAMUSCULAR | Status: DC

## 2012-09-12 MED ORDER — SODIUM CHLORIDE 0.9 % IV SOLN
Freq: Once | INTRAVENOUS | Status: AC
  Administered 2012-09-12: 15:00:00 via INTRAVENOUS

## 2012-09-12 MED ORDER — LORAZEPAM 1 MG PO TABS: 1.0000 mg | ORAL_TABLET | Freq: Four times a day (QID) | ORAL | Status: DC | PRN

## 2012-09-12 MED ORDER — HEPARIN SOD (PORK) LOCK FLUSH 100 UNIT/ML IV SOLN
500.0000 [IU] | Freq: Once | INTRAVENOUS | Status: AC | PRN
  Administered 2012-09-12: 500 [IU]
  Filled 2012-09-12: qty 5

## 2012-09-12 MED ORDER — ONDANSETRON HCL 8 MG PO TABS: 8.0000 mg | ORAL_TABLET | Freq: Two times a day (BID) | ORAL | Status: DC

## 2012-09-12 MED ORDER — DEXAMETHASONE SODIUM PHOSPHATE 4 MG/ML IJ SOLN
20.0000 mg | Freq: Once | INTRAMUSCULAR | Status: AC
  Administered 2012-09-12: 20 mg via INTRAVENOUS

## 2012-09-12 MED ORDER — CARBOPLATIN CHEMO INJECTION 600 MG/60ML
900.0000 mg | Freq: Once | INTRAVENOUS | Status: AC
  Administered 2012-09-12: 900 mg via INTRAVENOUS
  Filled 2012-09-12: qty 90

## 2012-09-12 MED ORDER — LORAZEPAM 1 MG PO TABS
1.0000 mg | ORAL_TABLET | Freq: Once | ORAL | Status: AC
  Administered 2012-09-12: 1 mg via SUBLINGUAL

## 2012-09-12 MED ORDER — DEXTROSE 5 % IV SOLN
75.0000 mg/m2 | Freq: Once | INTRAVENOUS | Status: AC
  Administered 2012-09-12: 200 mg via INTRAVENOUS
  Filled 2012-09-12: qty 20

## 2012-09-12 MED ORDER — SODIUM CHLORIDE 0.9 % IJ SOLN
10.0000 mL | INTRAMUSCULAR | Status: DC | PRN
  Administered 2012-09-12: 10 mL
  Filled 2012-09-12: qty 10

## 2012-09-12 MED ORDER — ONDANSETRON 16 MG/50ML IVPB (CHCC)
16.0000 mg | Freq: Once | INTRAVENOUS | Status: AC
  Administered 2012-09-12: 16 mg via INTRAVENOUS

## 2012-09-12 MED ORDER — PROCHLORPERAZINE MALEATE 10 MG PO TABS: 10.0000 mg | ORAL_TABLET | Freq: Four times a day (QID) | ORAL | Status: DC | PRN

## 2012-09-12 NOTE — Patient Instructions (Addendum)
[facility] Discharge Instructions for Patients Receiving Chemotherapy  Today you received the following chemotherapy agents Carboplatin, Taxotere To help prevent nausea and vomiting after your treatment, we encourage you to take your nausea medication  1)Zofran 8mg  daily Days 2,3,4 post chemo. Take this for nausea beginning 09/13/12 2)Ativan 1 mg by mouth every 6 hours as needed for nausea/vomiting 3)Compazine 10 mg by mouth every 6 hours as needed for Nausea and vomiting 4) Decadron (Dexamethasone) Take as prescribed by Dr. Marin Olp   If you develop nausea and vomiting that is not controlled by your nausea medication, call the clinic at  [phone].  If it is after clinic hours call our office to be transferred to the main [facility] or call [phone].  You can also call your family physician or the  the Emergency Department.   BELOW ARE SYMPTOMS THAT SHOULD BE REPORTED IMMEDIATELY:  *FEVER GREATER THAN 100.5 F  *CHILLS WITH OR WITHOUT FEVER  NAUSEA AND VOMITING THAT IS NOT CONTROLLED WITH YOUR NAUSEA MEDICATION  *UNUSUAL SHORTNESS OF BREATH  *UNUSUAL BRUISING OR BLEEDING  TENDERNESS IN MOUTH AND THROAT WITH OR WITHOUT PRESENCE OF ULCERS  *URINARY PROBLEMS  *BOWEL PROBLEMS  UNUSUAL RASH Items with * indicate a potential emergency and should be followed up as soon as possible.  One of the nurses will contact you 24 hours after your treatment. Please let the nurse know about any problems that you may have experienced. Feel free to call the clinic you have any questions or concerns. The clinic phone number is [phone].   I have been informed and understand all the instructions given to me. I know to contact the clinic, my physician, or go to the Emergency Department if any problems should occur. I do not have any questions at this time, but understand that I may call the clinic during office hours   should I have any questions or need assistance in  obtaining follow up care.    __________________________________________  _____________  __________ Signature of Patient or Authorized Representative            Date                   Time    __________________________________________ Nurse's Signature   Carboplatin injection What is this medicine? CARBOPLATIN (KAR boe pla tin) is a chemotherapy drug. It targets fast dividing cells, like cancer cells, and causes these cells to die. This medicine is used to treat ovarian cancer and many other cancers. This medicine may be used for other purposes; ask your health care provider or pharmacist if you have questions. What should I tell my health care provider before I take this medicine? They need to know if you have any of these conditions: -blood disorders -hearing problems -kidney disease -recent or ongoing radiation therapy -an unusual or allergic reaction to carboplatin, cisplatin, other chemotherapy, other medicines, foods, dyes, or preservatives -pregnant or trying to get pregnant -breast-feeding How should I use this medicine? This drug is usually given as an infusion into a vein. It is administered in a hospital or clinic by a specially trained health care professional. Talk to your pediatrician regarding the use of this medicine in children. Special care may be needed. Overdosage: If you think you have taken too much of this medicine contact a poison control center or emergency room at once. NOTE: This medicine is only for you. Do not share this medicine with others. What if I miss  a dose? It is important not to miss a dose. Call your doctor or health care professional if you are unable to keep an appointment. What may interact with this medicine? -medicines for seizures -medicines to increase blood counts like filgrastim, pegfilgrastim, sargramostim -some antibiotics like amikacin, gentamicin, neomycin, streptomycin, tobramycin -vaccines Talk to your doctor or health care  professional before taking any of these medicines: -acetaminophen -aspirin -ibuprofen -ketoprofen -naproxen This list may not describe all possible interactions. Give your health care provider a list of all the medicines, herbs, non-prescription drugs, or dietary supplements you use. Also tell them if you smoke, drink alcohol, or use illegal drugs. Some items may interact with your medicine. What should I watch for while using this medicine? Your condition will be monitored carefully while you are receiving this medicine. You will need important blood work done while you are taking this medicine. This drug may make you feel generally unwell. This is not uncommon, as chemotherapy can affect healthy cells as well as cancer cells. Report any side effects. Continue your course of treatment even though you feel ill unless your doctor tells you to stop. In some cases, you may be given additional medicines to help with side effects. Follow all directions for their use. Call your doctor or health care professional for advice if you get a fever, chills or sore throat, or other symptoms of a cold or flu. Do not treat yourself. This drug decreases your body's ability to fight infections. Try to avoid being around people who are sick. This medicine may increase your risk to bruise or bleed. Call your doctor or health care professional if you notice any unusual bleeding. Be careful brushing and flossing your teeth or using a toothpick because you may get an infection or bleed more easily. If you have any dental work done, tell your dentist you are receiving this medicine. Avoid taking products that contain aspirin, acetaminophen, ibuprofen, naproxen, or ketoprofen unless instructed by your doctor. These medicines may hide a fever. Do not become pregnant while taking this medicine. Women should inform their doctor if they wish to become pregnant or think they might be pregnant. There is a potential for serious side  effects to an unborn child. Talk to your health care professional or pharmacist for more information. Do not breast-feed an infant while taking this medicine. What side effects may I notice from receiving this medicine? Side effects that you should report to your doctor or health care professional as soon as possible: -allergic reactions like skin rash, itching or hives, swelling of the face, lips, or tongue -signs of infection - fever or chills, cough, sore throat, pain or difficulty passing urine -signs of decreased platelets or bleeding - bruising, pinpoint red spots on the skin, black, tarry stools, nosebleeds -signs of decreased red blood cells - unusually weak or tired, fainting spells, lightheadedness -breathing problems -changes in hearing -changes in vision -chest pain -high blood pressure -low blood counts - This drug may decrease the number of white blood cells, red blood cells and platelets. You may be at increased risk for infections and bleeding. -nausea and vomiting -pain, swelling, redness or irritation at the injection site -pain, tingling, numbness in the hands or feet -problems with balance, talking, walking -trouble passing urine or change in the amount of urine Side effects that usually do not require medical attention (report to your doctor or health care professional if they continue or are bothersome): -hair loss -loss of appetite -metallic taste in  the mouth or changes in taste This list may not describe all possible side effects. Call your doctor for medical advice about side effects. You may report side effects to FDA at 1-800-FDA-1088. Where should I keep my medicine? This drug is given in a hospital or clinic and will not be stored at home. NOTE: This sheet is a summary. It may not cover all possible information. If you have questions about this medicine, talk to your doctor, pharmacist, or health care provider.  2012, [employer]. (12/31/2007 2:38:05  PM)Docetaxel injection What is this medicine?    DOCETAXEL (doe se TAX el) is a chemotherapy drug. It targets fast dividing cells, like cancer cells, and causes these cells to die. This medicine is used to treat many types of cancers like breast cancer, certain stomach cancers, head and neck cancer, lung cancer, and prostate cancer. This medicine may be used for other purposes; ask your health care provider or pharmacist if you have questions. What should I tell my health care provider before I take this medicine? They need to know if you have any of these conditions: -infection (especially a virus infection such as chickenpox, cold sores, or herpes) -liver disease -low blood counts, like low white cell, platelet, or red cell counts -an unusual or allergic reaction to docetaxel, polysorbate 80, other chemotherapy agents, other medicines, foods, dyes, or preservatives -pregnant or trying to get pregnant -breast-feeding How should I use this medicine? This drug is given as an infusion into a vein. It is administered in a hospital or clinic by a specially trained health care professional. Talk to your pediatrician regarding the use of this medicine in children. Special care may be needed. Overdosage: If you think you have taken too much of this medicine contact a poison control center or emergency room at once. NOTE: This medicine is only for you. Do not share this medicine with others. What if I miss a dose? It is important not to miss your dose. Call your doctor or health care professional if you are unable to keep an appointment. What may interact with this medicine? -cyclosporine -erythromycin -ketoconazole -medicines to increase blood counts like filgrastim, pegfilgrastim, sargramostim -vaccines Talk to your doctor or health care professional before taking any of these medicines: -acetaminophen -aspirin -ibuprofen -ketoprofen -naproxen This list may not describe all possible  interactions. Give your health care provider a list of all the medicines, herbs, non-prescription drugs, or dietary supplements you use. Also tell them if you smoke, drink alcohol, or use illegal drugs. Some items may interact with your medicine. What should I watch for while using this medicine? Your condition will be monitored carefully while you are receiving this medicine. You will need important blood work done while you are taking this medicine. This drug may make you feel generally unwell. This is not uncommon, as chemotherapy can affect healthy cells as well as cancer cells. Report any side effects. Continue your course of treatment even though you feel ill unless your doctor tells you to stop. In some cases, you may be given additional medicines to help with side effects. Follow all directions for their use. Call your doctor or health care professional for advice if you get a fever, chills or sore throat, or other symptoms of a cold or flu. Do not treat yourself. This drug decreases your body's ability to fight infections. Try to avoid being around people who are sick. This medicine may increase your risk to bruise or bleed. Call your doctor or  health care professional if you notice any unusual bleeding. Be careful brushing and flossing your teeth or using a toothpick because you may get an infection or bleed more easily. If you have any dental work done, tell your dentist you are receiving this medicine. Avoid taking products that contain aspirin, acetaminophen, ibuprofen, naproxen, or ketoprofen unless instructed by your doctor. These medicines may hide a fever. Do not become pregnant while taking this medicine. Women should inform their doctor if they wish to become pregnant or think they might be pregnant. There is a potential for serious side effects to an unborn child. Talk to your health care professional or pharmacist for more information. Do not breast-feed an infant while taking this  medicine. What side effects may I notice from receiving this medicine? Side effects that you should report to your doctor or health care professional as soon as possible: -allergic reactions like skin rash, itching or hives, swelling of the face, lips, or tongue -low blood counts - This drug may decrease the number of white blood cells, red blood cells and platelets. You may be at increased risk for infections and bleeding. -signs of infection - fever or chills, cough, sore throat, pain or difficulty passing urine -signs of decreased platelets or bleeding - bruising, pinpoint red spots on the skin, black, tarry stools, nosebleeds -signs of decreased red blood cells - unusually weak or tired, fainting spells, lightheadedness -breathing problems -fast or irregular heartbeat -low blood pressure -mouth sores -nausea and vomiting -pain, swelling, redness or irritation at the injection site -pain, tingling, numbness in the hands or feet -swelling of the ankle, feet, hands -weight gain Side effects that usually do not require medical attention (report to your prescriber or health care professional if they continue or are bothersome): -bone pain -complete hair loss including hair on your head, underarms, pubic hair, eyebrows, and eyelashes -diarrhea -excessive tearing -changes in the color of fingernails -loosening of the fingernails -nausea -muscle pain -red flush to skin -sweating -weak or tired This list may not describe all possible side effects. Call your doctor for medical advice about side effects. You may report side effects to FDA at 1-800-FDA-1088. Where should I keep my medicine? This drug is given in a hospital or clinic and will not be stored at home. NOTE: This sheet is a summary. It may not cover all possible information. If you have questions about this medicine, talk to your doctor, pharmacist, or health care provider.  2012, [employer] Standard. (09/07/2008 11:52:10 AM)

## 2012-09-13 ENCOUNTER — Ambulatory Visit (HOSPITAL_BASED_OUTPATIENT_CLINIC_OR_DEPARTMENT_OTHER): Payer: Medicare Other

## 2012-09-13 VITALS — BP 152/95 | HR 89 | Temp 97.2°F | Resp 20

## 2012-09-13 DIAGNOSIS — C773 Secondary and unspecified malignant neoplasm of axilla and upper limb lymph nodes: Secondary | ICD-10-CM

## 2012-09-13 DIAGNOSIS — C50419 Malignant neoplasm of upper-outer quadrant of unspecified female breast: Secondary | ICD-10-CM

## 2012-09-13 MED ORDER — PEGFILGRASTIM INJECTION 6 MG/0.6ML
6.0000 mg | Freq: Once | SUBCUTANEOUS | Status: AC
  Administered 2012-09-13: 6 mg via SUBCUTANEOUS

## 2012-09-13 NOTE — Patient Instructions (Signed)
Zoledronic Acid injection (Hypercalcemia, Oncology) What is this medicine? ZOLEDRONIC ACID (ZOE le dron ik AS id) lowers the amount of calcium loss from bone. It is used to treat too much calcium in your blood from cancer. It is also used to prevent complications of cancer that has spread to the bone. This medicine may be used for other purposes; ask your health care provider or pharmacist if you have questions. What should I tell my health care provider before I take this medicine? They need to know if you have any of these conditions: -aspirin-sensitive asthma -dental disease -kidney disease -an unusual or allergic reaction to zoledronic acid, other medicines, foods, dyes, or preservatives -pregnant or trying to get pregnant -breast-feeding How should I use this medicine? This medicine is for infusion into a vein. It is given by a health care professional in a hospital or clinic setting. Talk to your pediatrician regarding the use of this medicine in children. Special care may be needed. Overdosage: If you think you have taken too much of this medicine contact a poison control center or emergency room at once. NOTE: This medicine is only for you. Do not share this medicine with others. What if I miss a dose? It is important not to miss your dose. Call your doctor or health care professional if you are unable to keep an appointment. What may interact with this medicine? -certain antibiotics given by injection -NSAIDs, medicines for pain and inflammation, like ibuprofen or naproxen -some diuretics like bumetanide, furosemide -teriparatide -thalidomide This list may not describe all possible interactions. Give your health care provider a list of all the medicines, herbs, non-prescription drugs, or dietary supplements you use. Also tell them if you smoke, drink alcohol, or use illegal drugs. Some items may interact with your medicine. What should I watch for while using this medicine? Visit  your doctor or health care professional for regular checkups. It may be some time before you see the benefit from this medicine. Do not stop taking your medicine unless your doctor tells you to. Your doctor may order blood tests or other tests to see how you are doing. Women should inform their doctor if they wish to become pregnant or think they might be pregnant. There is a potential for serious side effects to an unborn child. Talk to your health care professional or pharmacist for more information. You should make sure that you get enough calcium and vitamin D while you are taking this medicine. Discuss the foods you eat and the vitamins you take with your health care professional. Some people who take this medicine have severe bone, joint, and/or muscle pain. This medicine may also increase your risk for a broken thigh bone. Tell your doctor right away if you have pain in your upper leg or groin. Tell your doctor if you have any pain that does not go away or that gets worse. What side effects may I notice from receiving this medicine? Side effects that you should report to your doctor or health care professional as soon as possible: -allergic reactions like skin rash, itching or hives, swelling of the face, lips, or tongue -anxiety, confusion, or depression -breathing problems -changes in vision -feeling faint or lightheaded, falls -jaw burning, cramping, pain -muscle cramps, stiffness, or weakness -trouble passing urine or change in the amount of urine Side effects that usually do not require medical attention (report to your doctor or health care professional if they continue or are bothersome): -bone, joint, or muscle pain -  fever -hair loss -irritation at site where injected -loss of appetite -nausea, vomiting -stomach upset -tired This list may not describe all possible side effects. Call your doctor for medical advice about side effects. You may report side effects to FDA at  1-800-FDA-1088. Where should I keep my medicine? This drug is given in a hospital or clinic and will not be stored at home. NOTE: This sheet is a summary. It may not cover all possible information. If you have questions about this medicine, talk to your doctor, pharmacist, or health care provider.  2012, [employer]. (03/24/2011 9:06:58 AM)

## 2012-09-16 ENCOUNTER — Encounter (HOSPITAL_COMMUNITY)
Admission: RE | Admit: 2012-09-16 | Discharge: 2012-09-16 | Disposition: A | Discharge status: Home / Self Care | Payer: Medicare Other | Source: Ambulatory Visit | Attending: Hematology & Oncology | Admitting: Hematology & Oncology

## 2012-09-16 DIAGNOSIS — C50419 Malignant neoplasm of upper-outer quadrant of unspecified female breast: Secondary | ICD-10-CM

## 2012-09-18 ENCOUNTER — Telehealth: Payer: Self-pay | Admitting: Hematology & Oncology

## 2012-09-18 ENCOUNTER — Other Ambulatory Visit: Payer: Medicare Other | Admitting: Lab

## 2012-09-18 ENCOUNTER — Ambulatory Visit: Payer: Medicare Other | Admitting: Medical

## 2012-09-18 NOTE — Telephone Encounter (Signed)
Pt called wanting to speak to RN, transferred call to RN. RN transferred call back to me to schedule appointment for Pt to see PA today. I scheduled appointment but pt said she would call me back because she has to get a ride from her brother. Pt called back said she couldn't come until 12-12 after 2pm due to transportation issues with her brother. Pt aware of 12-12 appointment. RN aware of situation.

## 2012-09-19 ENCOUNTER — Ambulatory Visit (HOSPITAL_BASED_OUTPATIENT_CLINIC_OR_DEPARTMENT_OTHER): Payer: Medicare Other | Admitting: Medical

## 2012-09-19 ENCOUNTER — Other Ambulatory Visit (HOSPITAL_BASED_OUTPATIENT_CLINIC_OR_DEPARTMENT_OTHER): Payer: Medicare Other | Admitting: Lab

## 2012-09-19 ENCOUNTER — Encounter: Payer: Self-pay | Admitting: Hematology & Oncology

## 2012-09-19 VITALS — BP 134/74 | HR 100 | Temp 97.6°F | Resp 18 | Ht 68.0 in | Wt 297.0 lb

## 2012-09-19 DIAGNOSIS — R42 Dizziness and giddiness: Secondary | ICD-10-CM

## 2012-09-19 DIAGNOSIS — C50419 Malignant neoplasm of upper-outer quadrant of unspecified female breast: Secondary | ICD-10-CM

## 2012-09-19 DIAGNOSIS — C7A00 Malignant carcinoid tumor of unspecified site: Secondary | ICD-10-CM

## 2012-09-19 LAB — CBC WITH DIFFERENTIAL (CANCER CENTER ONLY)
BASO%: 0.4 % (ref 0.0–2.0)
EOS%: 3.5 % (ref 0.0–7.0)
LYMPH%: 28.7 % (ref 14.0–48.0)
MCH: 27.5 pg (ref 26.0–34.0)
MCHC: 32.6 g/dL (ref 32.0–36.0)
MCV: 85 fL (ref 81–101)
MONO%: 14.7 % — ABNORMAL HIGH (ref 0.0–13.0)
Platelets: 270 10*3/uL (ref 145–400)
RDW: 14.9 % (ref 11.1–15.7)

## 2012-09-19 LAB — COMPREHENSIVE METABOLIC PANEL
ALT: 214 U/L — ABNORMAL HIGH (ref 0–35)
Alkaline Phosphatase: 105 U/L (ref 39–117)
CO2: 29 mEq/L (ref 19–32)
Glucose, Bld: 198 mg/dL — ABNORMAL HIGH (ref 70–99)
Sodium: 134 mEq/L — ABNORMAL LOW (ref 135–145)
Total Bilirubin: 1.1 mg/dL (ref 0.3–1.2)
Total Protein: 7.1 g/dL (ref 6.0–8.3)

## 2012-09-19 NOTE — Progress Notes (Signed)
Diagnoses: #1 stage IIb (T2, N1, M0) infiltrating ductal carcinoma of the left breast-triple negative. #2 Metastatic carcinoid.  Current therapy: #1.  She is status post 1 cycle of adjuvant chemotherapy utilizing carboplatin/Taxotere.  Interim history: Amber Bautista presents today for an office followup visit.  She just had her first cycle of adjuvant chemotherapy utilizing carboplatin/Taxotere back on 09/12/2012.  Of note, we decided to get a MUGA scan on Amber Bautista to confirm her ejection fraction from her previous echo.  Surprisingly enough, it was much better at 51%.  Her ejection fraction from echo, was 25-30%.  One would think she would be quite symptomatic with this low of an ejection fraction, however, she was not.  I'm not quite sure, the significant difference between the report from the echo, and the MUGA, however, we will go with the 51%, ejection fraction.  This will not change the type of treatment.  She is receiving.  We will continue with carboplatin/Taxotere.  She does report some side effects related to chemotherapy.  She is having some excessive fatigue.  She does report, at times when she goes from a lying or sitting position.  If she does feel like she is going to pass out.  She was recently placed on a new blood pressure medication.  I did it 5 her to monitor her blood pressure as it could be low.  If this is the case.  I told her to get back in touch with her primary care physician, and she may need to come off of her blood pressure medication.  She's not having any nausea.  She's not reporting any vomiting.  She continues to have diarrhea.  This is most likely secondary from her carcinoid.  She does have some flushing as well.  She denies any chest pain, or shortness of breath, or cough.  She denies any headaches, visual changes, or rashes.  She denies any obvious, or abnormal bleeding.  She did report, that she had an episode of blood in her stool x1 and then it resolved.  She does not  report any type of mucositis.  She's not reporting any type of neuropathy.  She's not reporting any changes in her skin except for some dryness.  In terms of her carcinoid, she is now off of the Sandostatin.  She reports, that she felt that she was refractory to wait and he really wasn't helping her symptoms.  Her last chromogranin A level on December 2 was 4.6.  It was 74.02 months ago.  Review of Systems: Constitutional:Negative for malaise/fatigue, fever, chills, weight loss, diaphoresis, activity change, appetite change, and unexpected weight change.  HEENT: Negative for double vision, blurred vision, visual loss, ear pain, tinnitus, congestion, rhinorrhea, epistaxis sore throat or sinus disease, oral pain/lesion, tongue soreness Respiratory: Negative for cough, chest tightness, shortness of breath, wheezing and stridor.  Cardiovascular: Negative for chest pain, palpitations, leg swelling, orthopnea, PND, DOE or claudication Gastrointestinal: Negative for nausea, vomiting, abdominal pain, diarrhea, constipation, blood in stool, melena, hematochezia, abdominal distention, anal bleeding, rectal pain, anorexia and hematemesis.  Genitourinary: Negative for dysuria, frequency, hematuria,  Musculoskeletal: Negative for myalgias, back pain, joint swelling, arthralgias and gait problem.  Skin: Negative for rash, color change, pallor and wound.  Neurological:. Negative for dizziness/light-headedness, tremors, seizures, syncope, facial asymmetry, speech difficulty, weakness, numbness, headaches and paresthesias.  Hematological: Negative for adenopathy. Does not bruise/bleed easily.  Psychiatric/Behavioral:  Negative for depression, no loss of interest in normal activity or change in sleep pattern.  Physical Exam: This is a very pleasant, obese, 41 year old, Hispanic female, in no obvious distress Vitals: Temperature 97.6 degrees, pulse 100, respirations 18, blood pressure 134, 74 weight 297 pounds HEENT  reveals a normocephalic, atraumatic skull, no scleral icterus, no oral lesions  Neck is supple without any cervical or supraclavicular adenopathy.  Lungs are clear to auscultation bilaterally. There are no wheezes, rales or rhonci Cardiac is regular rate and rhythm with a normal S1 and S2. There are no murmurs, rubs, or bruits.  Abdomen is soft with good bowel sounds, there is no palpable mass. There is no palpable hepatosplenomegaly. There is no palpable fluid wave.  Musculoskeletal no tenderness of the spine, ribs, or hips.  Extremities there are no clubbing, cyanosis, or edema.  Skin no petechia, purpura or ecchymosis Neurologic is nonfocal.  Laboratory Data: White count 5.4, hemoglobin 13.1, hematocrit 40.2, platelets 270,000  Current Outpatient Prescriptions on File Prior to Visit  Medication Sig Dispense Refill  . cefdinir (OMNICEF) 300 MG capsule Take 300 mg by mouth 2 (two) times daily.       Marland Kitchen HYDROcodone-acetaminophen (NORCO/VICODIN) 5-325 MG per tablet Take 1-2 tablets by mouth every 4 (four) hours as needed for pain.  50 tablet  1  . KLOR-CON M20 20 MEQ tablet Take 20 mEq by mouth daily.       Marland Kitchen LORazepam (ATIVAN) 1 MG tablet Place 1 tablet (1 mg total) under the tongue every 6 (six) hours as needed for anxiety. Or nausea and vomiting  30 tablet  3  . metoprolol tartrate (LOPRESSOR) 25 MG tablet Take 25 mg by mouth 2 (two) times daily.       . ondansetron (ZOFRAN) 8 MG tablet Take 1 tablet (8 mg total) by mouth 2 (two) times daily. Take one tablet in am and one tablet in pm beginning the day after chemotherapy x 3 days  20 tablet  3  . prochlorperazine (COMPAZINE) 10 MG tablet Take 1 tablet (10 mg total) by mouth every 6 (six) hours as needed.  30 tablet  0   Assessment/Plan: This is a 41 year old, Hispanic female, with the following issues:  #1 stage IIb infiltrating ductal carcinoma of the left breast.  She is troponin negative.  She is status post 1 cycle of  carboplatin/Taxotere.  She is having some side effects related to the chemotherapy.  I did inform her that she should expect some fatigue, which is quite normal.  #2.  Metastatic carcinoid.  For the time being, we have discontinued her Sandostatin.  She reports, that it really isn't doing anything for her, in terms of her symptoms.  We will continue to monitor this.  #3.  Intermittent lightheadedness.  She is on Dyazide.  This is new for her.  I told her this medication combined with chemotherapy can sometimes cause hypotension.  She's going to get a blood pressure machine to monitor her blood pressure.  I informed her to document her blood pressure numbers, and, if it is low to call her primary care physician.  #4.  Followup.  Amber Bautista will follow back up with [place] on December 26, but before then should there be questions or concerns.

## 2012-09-30 ENCOUNTER — Other Ambulatory Visit: Payer: Self-pay | Admitting: *Deleted

## 2012-09-30 NOTE — Progress Notes (Signed)
onc treatment note sent to scheduler for Neulasta

## 2012-10-03 ENCOUNTER — Ambulatory Visit (HOSPITAL_BASED_OUTPATIENT_CLINIC_OR_DEPARTMENT_OTHER): Payer: Medicare Other | Admitting: Medical

## 2012-10-03 ENCOUNTER — Ambulatory Visit (HOSPITAL_BASED_OUTPATIENT_CLINIC_OR_DEPARTMENT_OTHER): Payer: Medicare Other

## 2012-10-03 ENCOUNTER — Other Ambulatory Visit (HOSPITAL_BASED_OUTPATIENT_CLINIC_OR_DEPARTMENT_OTHER): Payer: Medicare Other | Admitting: Lab

## 2012-10-03 VITALS — BP 137/94 | HR 115 | Temp 98.1°F | Resp 18 | Ht 68.0 in | Wt 302.0 lb

## 2012-10-03 DIAGNOSIS — C7A00 Malignant carcinoid tumor of unspecified site: Secondary | ICD-10-CM

## 2012-10-03 DIAGNOSIS — C50419 Malignant neoplasm of upper-outer quadrant of unspecified female breast: Secondary | ICD-10-CM

## 2012-10-03 DIAGNOSIS — Z171 Estrogen receptor negative status [ER-]: Secondary | ICD-10-CM

## 2012-10-03 DIAGNOSIS — Z5111 Encounter for antineoplastic chemotherapy: Secondary | ICD-10-CM

## 2012-10-03 LAB — CBC WITH DIFFERENTIAL (CANCER CENTER ONLY)
BASO%: 0.2 % (ref 0.0–2.0)
HCT: 38.2 % (ref 34.8–46.6)
HGB: 12.3 g/dL (ref 11.6–15.9)
LYMPH#: 1.4 10*3/uL (ref 0.9–3.3)
MONO#: 1.3 10*3/uL — ABNORMAL HIGH (ref 0.1–0.9)
NEUT#: 16.4 10*3/uL — ABNORMAL HIGH (ref 1.5–6.5)
NEUT%: 85.7 % — ABNORMAL HIGH (ref 39.6–80.0)
RDW: 15.7 % (ref 11.1–15.7)
WBC: 19.2 10*3/uL — ABNORMAL HIGH (ref 3.9–10.0)

## 2012-10-03 LAB — COMPREHENSIVE METABOLIC PANEL
ALT: 75 U/L — ABNORMAL HIGH (ref 0–35)
Albumin: 4.2 g/dL (ref 3.5–5.2)
CO2: 25 mEq/L (ref 19–32)
Calcium: 9.4 mg/dL (ref 8.4–10.5)
Chloride: 100 mEq/L (ref 96–112)
Creatinine, Ser: 0.97 mg/dL (ref 0.50–1.10)
Potassium: 3.6 mEq/L (ref 3.5–5.3)
Total Protein: 7.5 g/dL (ref 6.0–8.3)

## 2012-10-03 MED ORDER — DOCETAXEL CHEMO INJECTION 160 MG/16ML
75.0000 mg/m2 | Freq: Once | INTRAVENOUS | Status: AC
  Administered 2012-10-03: 200 mg via INTRAVENOUS
  Filled 2012-10-03: qty 20

## 2012-10-03 MED ORDER — SODIUM CHLORIDE 0.9 % IV SOLN
900.0000 mg | Freq: Once | INTRAVENOUS | Status: AC
  Administered 2012-10-03: 900 mg via INTRAVENOUS
  Filled 2012-10-03: qty 90

## 2012-10-03 MED ORDER — OCTREOTIDE ACETATE 30 MG IM KIT: 30.0000 mg | PACK | Freq: Once | INTRAMUSCULAR | Status: DC

## 2012-10-03 MED ORDER — SODIUM CHLORIDE 0.9 % IV SOLN
Freq: Once | INTRAVENOUS | Status: AC
  Administered 2012-10-03: 13:00:00 via INTRAVENOUS

## 2012-10-03 MED ORDER — SODIUM CHLORIDE 0.9 % IJ SOLN
10.0000 mL | INTRAMUSCULAR | Status: DC | PRN
  Filled 2012-10-03: qty 10

## 2012-10-03 MED ORDER — ONDANSETRON 16 MG/50ML IVPB (CHCC)
16.0000 mg | Freq: Once | INTRAVENOUS | Status: AC
  Administered 2012-10-03: 16 mg via INTRAVENOUS

## 2012-10-03 MED ORDER — HEPARIN SOD (PORK) LOCK FLUSH 100 UNIT/ML IV SOLN
500.0000 [IU] | Freq: Once | INTRAVENOUS | Status: DC | PRN
  Filled 2012-10-03: qty 5

## 2012-10-03 MED ORDER — DEXAMETHASONE SODIUM PHOSPHATE 4 MG/ML IJ SOLN
20.0000 mg | Freq: Once | INTRAMUSCULAR | Status: AC
  Administered 2012-10-03: 20 mg via INTRAVENOUS

## 2012-10-03 NOTE — Progress Notes (Signed)
Diagnoses: #1 stage IIb (T2, N1, M0) infiltrating ductal carcinoma of the left breast-triple negative. #2 Metastatic carcinoid.  Current therapy: #1.  She is status post 1 cycle of adjuvant chemotherapy utilizing carboplatin/Taxotere.  Interim history: Ms. Amber Bautista presents today for an office followup visit.  She is s/p her first cycle of adjuvant chemotherapy utilizing carboplatin/Taxotere back on 09/12/2012.  She did have some excessive fatigue and overall malaise, but that has now resolved.  She reports, that her hair did completely fall out.  She does have a nice wig.  She's not having any nausea.  She's not reporting any vomiting.  She continues to have diarrhea.  This is most likely secondary from her carcinoid.  She does have some flushing as well.  She denies any chest pain, or shortness of breath, or cough.  She denies any headaches, visual changes, or rashes.  She denies any obvious, or abnormal bleeding. She does not report any type of mucositis.  She's not reporting any type of neuropathy.  She's not reporting any changes in her skin except for some dryness.  In terms of her carcinoid, she is now off of the Sandostatin.  She reports, that she felt that she was refractory to it, and it really wasn't helping her symptoms.  Her last chromogranin A level on December 2 was 4.6.  It was 74.0 three months ago.  She will go ahead and proceed with her second cycle of chemotherapy today.  She does receive a Neulasta injection the day after chemotherapy.  Of note, we decided to get a MUGA scan on Ms. Amber Bautista to confirm her ejection fraction from her previous echo.  Surprisingly enough, it was much better at 51%.  Her ejection fraction from echo, was 25-30%.  One would think she would be quite symptomatic with this low of an ejection fraction, however, she was not.  I'm not quite sure, the significant difference between the report from the echo, and the MUGA, however, we will go with the 51%, ejection fraction.     Review of Systems: Constitutional:Negative for malaise/fatigue, fever, chills, weight loss, diaphoresis, activity change, appetite change, and unexpected weight change.  HEENT: Negative for double vision, blurred vision, visual loss, ear pain, tinnitus, congestion, rhinorrhea, epistaxis sore throat or sinus disease, oral pain/lesion, tongue soreness Respiratory: Negative for cough, chest tightness, shortness of breath, wheezing and stridor.  Cardiovascular: Negative for chest pain, palpitations, leg swelling, orthopnea, PND, DOE or claudication Gastrointestinal: Negative for nausea, vomiting, abdominal pain, diarrhea, constipation, blood in stool, melena, hematochezia, abdominal distention, anal bleeding, rectal pain, anorexia and hematemesis.  Genitourinary: Negative for dysuria, frequency, hematuria,  Musculoskeletal: Negative for myalgias, back pain, joint swelling, arthralgias and gait problem.  Skin: Negative for rash, color change, pallor and wound.  Neurological:. Negative for dizziness/light-headedness, tremors, seizures, syncope, facial asymmetry, speech difficulty, weakness, numbness, headaches and paresthesias.  Hematological: Negative for adenopathy. Does not bruise/bleed easily.  Psychiatric/Behavioral:  Negative for depression, no loss of interest in normal activity or change in sleep pattern.   Physical Exam: This is a very pleasant, obese, 41 year old, Hispanic female, in no obvious distress Vitals: Temperature 90.1 degrees, pulse 1:15, respirations 18, blood pressure 137/94.  Weight 302 pounds HEENT reveals a normocephalic, atraumatic skull, no scleral icterus, no oral lesions  Neck is supple without any cervical or supraclavicular adenopathy.  Lungs are clear to auscultation bilaterally. There are no wheezes, rales or rhonci Cardiac is regular rate and rhythm with a normal S1 and S2. There are no  murmurs, rubs, or bruits.  Abdomen is soft with good bowel sounds, there is no  palpable mass. There is no palpable hepatosplenomegaly. There is no palpable fluid wave.  Musculoskeletal no tenderness of the spine, ribs, or hips.  Extremities there are no clubbing, cyanosis, or edema.  Skin no petechia, purpura or ecchymosis Neurologic is nonfocal.  Laboratory Data: White count 19.2, hemoglobin 12.3, hematocrit 38.2, platelets 395,000  Current Outpatient Prescriptions on File Prior to Visit  Medication Sig Dispense Refill  . dexamethasone (DECADRON) 4 MG tablet       . HYDROcodone-acetaminophen (NORCO/VICODIN) 5-325 MG per tablet Take 1-2 tablets by mouth every 4 (four) hours as needed for pain.  50 tablet  1  . KLOR-CON M20 20 MEQ tablet Take 20 mEq by mouth daily.       Marland Kitchen LORazepam (ATIVAN) 1 MG tablet Place 1 tablet (1 mg total) under the tongue every 6 (six) hours as needed for anxiety. Or nausea and vomiting  30 tablet  3  . metoprolol tartrate (LOPRESSOR) 25 MG tablet Take 25 mg by mouth 2 (two) times daily.       . ondansetron (ZOFRAN) 8 MG tablet Take 1 tablet (8 mg total) by mouth 2 (two) times daily. Take one tablet in am and one tablet in pm beginning the day after chemotherapy x 3 days  20 tablet  3  . prochlorperazine (COMPAZINE) 10 MG tablet Take 1 tablet (10 mg total) by mouth every 6 (six) hours as needed.  30 tablet  0  . triamterene-hydrochlorothiazide (DYAZIDE) 37.5-25 MG per capsule Take 1 capsule by mouth every morning.        Assessment/Plan: This is a 41 year old, Hispanic female, with the following issues:  #1 stage IIb infiltrating ductal carcinoma of the left breast.  She is triple negative.  She is status post 1 cycle of carboplatin/Taxotere.  She did have some side effects related to the chemotherapy.  I did inform her that she should expect some fatigue, which is quite normal.  She will go ahead and proceed with her second cycle of chemotherapy today.  #2.  Metastatic carcinoid.  For the time being, we have discontinued her Sandostatin.  She  reports, that it really isn't doing anything for her, in terms of her symptoms.  We will continue to monitor this.  #3.  Followup.  Ms. Amber Bautista will follow back up with [place] on January 16th, but before then should there be questions or concerns.

## 2012-10-03 NOTE — Patient Instructions (Signed)
[facility] Discharge Instructions for Patients Receiving Chemotherapy  Today you received the following chemotherapy agents Carboplatin, Taxotere To help prevent nausea and vomiting after your treatment, we encourage you to take your nausea medication  1)Zofran 8mg  daily Days 2,3,4 post chemo. Take this for nausea beginning 09/13/12 2)Ativan 1 mg by mouth every 6 hours as needed for nausea/vomiting 3)Compazine 10 mg by mouth every 6 hours as needed for Nausea and vomiting 4) Decadron (Dexamethasone) Take as prescribed by Dr. Marin Olp   If you develop nausea and vomiting that is not controlled by your nausea medication, call the clinic at  [phone].  If it is after clinic hours call our office to be transferred to the main [facility] or call [phone].  You can also call your family physician or the  the Emergency Department.   BELOW ARE SYMPTOMS THAT SHOULD BE REPORTED IMMEDIATELY:  *FEVER GREATER THAN 100.5 F  *CHILLS WITH OR WITHOUT FEVER  NAUSEA AND VOMITING THAT IS NOT CONTROLLED WITH YOUR NAUSEA MEDICATION  *UNUSUAL SHORTNESS OF BREATH  *UNUSUAL BRUISING OR BLEEDING  TENDERNESS IN MOUTH AND THROAT WITH OR WITHOUT PRESENCE OF ULCERS  *URINARY PROBLEMS  *BOWEL PROBLEMS  UNUSUAL RASH Items with * indicate a potential emergency and should be followed up as soon as possible.  One of the nurses will contact you 24 hours after your treatment. Please let the nurse know about any problems that you may have experienced. Feel free to call the clinic you have any questions or concerns. The clinic phone number is [phone].   I have been informed and understand all the instructions given to me. I know to contact the clinic, my physician, or go to the Emergency Department if any problems should occur. I do not have any questions at this time, but understand that I may call the clinic during office hours   should I have any questions or need assistance in  obtaining follow up care.    __________________________________________  _____________  __________ Signature of Patient or Authorized Representative            Date                   Time    __________________________________________ Nurse's Signature   Carboplatin injection What is this medicine? CARBOPLATIN (KAR boe pla tin) is a chemotherapy drug. It targets fast dividing cells, like cancer cells, and causes these cells to die. This medicine is used to treat ovarian cancer and many other cancers. This medicine may be used for other purposes; ask your health care provider or pharmacist if you have questions. What should I tell my health care provider before I take this medicine? They need to know if you have any of these conditions: -blood disorders -hearing problems -kidney disease -recent or ongoing radiation therapy -an unusual or allergic reaction to carboplatin, cisplatin, other chemotherapy, other medicines, foods, dyes, or preservatives -pregnant or trying to get pregnant -breast-feeding How should I use this medicine? This drug is usually given as an infusion into a vein. It is administered in a hospital or clinic by a specially trained health care professional. Talk to your pediatrician regarding the use of this medicine in children. Special care may be needed. Overdosage: If you think you have taken too much of this medicine contact a poison control center or emergency room at once. NOTE: This medicine is only for you. Do not share this medicine with others. What if I miss  a dose? It is important not to miss a dose. Call your doctor or health care professional if you are unable to keep an appointment. What may interact with this medicine? -medicines for seizures -medicines to increase blood counts like filgrastim, pegfilgrastim, sargramostim -some antibiotics like amikacin, gentamicin, neomycin, streptomycin, tobramycin -vaccines Talk to your doctor or health care  professional before taking any of these medicines: -acetaminophen -aspirin -ibuprofen -ketoprofen -naproxen This list may not describe all possible interactions. Give your health care provider a list of all the medicines, herbs, non-prescription drugs, or dietary supplements you use. Also tell them if you smoke, drink alcohol, or use illegal drugs. Some items may interact with your medicine. What should I watch for while using this medicine? Your condition will be monitored carefully while you are receiving this medicine. You will need important blood work done while you are taking this medicine. This drug may make you feel generally unwell. This is not uncommon, as chemotherapy can affect healthy cells as well as cancer cells. Report any side effects. Continue your course of treatment even though you feel ill unless your doctor tells you to stop. In some cases, you may be given additional medicines to help with side effects. Follow all directions for their use. Call your doctor or health care professional for advice if you get a fever, chills or sore throat, or other symptoms of a cold or flu. Do not treat yourself. This drug decreases your body's ability to fight infections. Try to avoid being around people who are sick. This medicine may increase your risk to bruise or bleed. Call your doctor or health care professional if you notice any unusual bleeding. Be careful brushing and flossing your teeth or using a toothpick because you may get an infection or bleed more easily. If you have any dental work done, tell your dentist you are receiving this medicine. Avoid taking products that contain aspirin, acetaminophen, ibuprofen, naproxen, or ketoprofen unless instructed by your doctor. These medicines may hide a fever. Do not become pregnant while taking this medicine. Women should inform their doctor if they wish to become pregnant or think they might be pregnant. There is a potential for serious side  effects to an unborn child. Talk to your health care professional or pharmacist for more information. Do not breast-feed an infant while taking this medicine. What side effects may I notice from receiving this medicine? Side effects that you should report to your doctor or health care professional as soon as possible: -allergic reactions like skin rash, itching or hives, swelling of the face, lips, or tongue -signs of infection - fever or chills, cough, sore throat, pain or difficulty passing urine -signs of decreased platelets or bleeding - bruising, pinpoint red spots on the skin, black, tarry stools, nosebleeds -signs of decreased red blood cells - unusually weak or tired, fainting spells, lightheadedness -breathing problems -changes in hearing -changes in vision -chest pain -high blood pressure -low blood counts - This drug may decrease the number of white blood cells, red blood cells and platelets. You may be at increased risk for infections and bleeding. -nausea and vomiting -pain, swelling, redness or irritation at the injection site -pain, tingling, numbness in the hands or feet -problems with balance, talking, walking -trouble passing urine or change in the amount of urine Side effects that usually do not require medical attention (report to your doctor or health care professional if they continue or are bothersome): -hair loss -loss of appetite -metallic taste in  the mouth or changes in taste This list may not describe all possible side effects. Call your doctor for medical advice about side effects. You may report side effects to FDA at 1-800-FDA-1088. Where should I keep my medicine? This drug is given in a hospital or clinic and will not be stored at home. NOTE: This sheet is a summary. It may not cover all possible information. If you have questions about this medicine, talk to your doctor, pharmacist, or health care provider.  2012, [employer]vier/Gold Standard. (12/31/2007 2:38:05  PM)Docetaxel injection What is this medicine?    DOCETAXEL (doe se TAX el) is a chemotherapy drug. It targets fast dividing cells, like cancer cells, and causes these cells to die. This medicine is used to treat many types of cancers like breast cancer, certain stomach cancers, head and neck cancer, lung cancer, and prostate cancer. This medicine may be used for other purposes; ask your health care provider or pharmacist if you have questions. What should I tell my health care provider before I take this medicine? They need to know if you have any of these conditions: -infection (especially a virus infection such as chickenpox, cold sores, or herpes) -liver disease -low blood counts, like low white cell, platelet, or red cell counts -an unusual or allergic reaction to docetaxel, polysorbate 80, other chemotherapy agents, other medicines, foods, dyes, or preservatives -pregnant or trying to get pregnant -breast-feeding How should I use this medicine? This drug is given as an infusion into a vein. It is administered in a hospital or clinic by a specially trained health care professional. Talk to your pediatrician regarding the use of this medicine in children. Special care may be needed. Overdosage: If you think you have taken too much of this medicine contact a poison control center or emergency room at once. NOTE: This medicine is only for you. Do not share this medicine with others. What if I miss a dose? It is important not to miss your dose. Call your doctor or health care professional if you are unable to keep an appointment. What may interact with this medicine? -cyclosporine -erythromycin -ketoconazole -medicines to increase blood counts like filgrastim, pegfilgrastim, sargramostim -vaccines Talk to your doctor or health care professional before taking any of these medicines: -acetaminophen -aspirin -ibuprofen -ketoprofen -naproxen This list may not describe all possible  interactions. Give your health care provider a list of all the medicines, herbs, non-prescription drugs, or dietary supplements you use. Also tell them if you smoke, drink alcohol, or use illegal drugs. Some items may interact with your medicine. What should I watch for while using this medicine? Your condition will be monitored carefully while you are receiving this medicine. You will need important blood work done while you are taking this medicine. This drug may make you feel generally unwell. This is not uncommon, as chemotherapy can affect healthy cells as well as cancer cells. Report any side effects. Continue your course of treatment even though you feel ill unless your doctor tells you to stop. In some cases, you may be given additional medicines to help with side effects. Follow all directions for their use. Call your doctor or health care professional for advice if you get a fever, chills or sore throat, or other symptoms of a cold or flu. Do not treat yourself. This drug decreases your body's ability to fight infections. Try to avoid being around people who are sick. This medicine may increase your risk to bruise or bleed. Call your doctor or  health care professional if you notice any unusual bleeding. Be careful brushing and flossing your teeth or using a toothpick because you may get an infection or bleed more easily. If you have any dental work done, tell your dentist you are receiving this medicine. Avoid taking products that contain aspirin, acetaminophen, ibuprofen, naproxen, or ketoprofen unless instructed by your doctor. These medicines may hide a fever. Do not become pregnant while taking this medicine. Women should inform their doctor if they wish to become pregnant or think they might be pregnant. There is a potential for serious side effects to an unborn child. Talk to your health care professional or pharmacist for more information. Do not breast-feed an infant while taking this  medicine. What side effects may I notice from receiving this medicine? Side effects that you should report to your doctor or health care professional as soon as possible: -allergic reactions like skin rash, itching or hives, swelling of the face, lips, or tongue -low blood counts - This drug may decrease the number of white blood cells, red blood cells and platelets. You may be at increased risk for infections and bleeding. -signs of infection - fever or chills, cough, sore throat, pain or difficulty passing urine -signs of decreased platelets or bleeding - bruising, pinpoint red spots on the skin, black, tarry stools, nosebleeds -signs of decreased red blood cells - unusually weak or tired, fainting spells, lightheadedness -breathing problems -fast or irregular heartbeat -low blood pressure -mouth sores -nausea and vomiting -pain, swelling, redness or irritation at the injection site -pain, tingling, numbness in the hands or feet -swelling of the ankle, feet, hands -weight gain Side effects that usually do not require medical attention (report to your prescriber or health care professional if they continue or are bothersome): -bone pain -complete hair loss including hair on your head, underarms, pubic hair, eyebrows, and eyelashes -diarrhea -excessive tearing -changes in the color of fingernails -loosening of the fingernails -nausea -muscle pain -red flush to skin -sweating -weak or tired This list may not describe all possible side effects. Call your doctor for medical advice about side effects. You may report side effects to FDA at 1-800-FDA-1088. Where should I keep my medicine? This drug is given in a hospital or clinic and will not be stored at home. NOTE: This sheet is a summary. It may not cover all possible information. If you have questions about this medicine, talk to your doctor, pharmacist, or health care provider.  2012, Elsevier/Gold Standard. (09/07/2008 11:52:10 AM)

## 2012-10-04 ENCOUNTER — Ambulatory Visit (HOSPITAL_BASED_OUTPATIENT_CLINIC_OR_DEPARTMENT_OTHER): Payer: Medicare Other

## 2012-10-04 ENCOUNTER — Ambulatory Visit: Payer: Medicare Other

## 2012-10-04 ENCOUNTER — Encounter: Payer: Self-pay | Admitting: Oncology

## 2012-10-04 VITALS — BP 149/100 | HR 72 | Temp 98.1°F | Resp 20

## 2012-10-04 DIAGNOSIS — C50419 Malignant neoplasm of upper-outer quadrant of unspecified female breast: Secondary | ICD-10-CM

## 2012-10-04 MED ORDER — PEGFILGRASTIM INJECTION 6 MG/0.6ML
6.0000 mg | Freq: Once | SUBCUTANEOUS | Status: AC
  Administered 2012-10-04: 6 mg via SUBCUTANEOUS

## 2012-10-04 NOTE — Patient Instructions (Addendum)

## 2012-10-10 ENCOUNTER — Other Ambulatory Visit: Payer: Self-pay | Admitting: *Deleted

## 2012-10-10 ENCOUNTER — Other Ambulatory Visit (HOSPITAL_BASED_OUTPATIENT_CLINIC_OR_DEPARTMENT_OTHER): Payer: Medicare Other | Admitting: Lab

## 2012-10-10 ENCOUNTER — Ambulatory Visit (HOSPITAL_BASED_OUTPATIENT_CLINIC_OR_DEPARTMENT_OTHER): Payer: Medicare Other | Admitting: Medical

## 2012-10-10 VITALS — BP 134/75 | HR 96 | Temp 98.5°F | Resp 16 | Ht 68.0 in | Wt 298.0 lb

## 2012-10-10 DIAGNOSIS — C50419 Malignant neoplasm of upper-outer quadrant of unspecified female breast: Secondary | ICD-10-CM

## 2012-10-10 DIAGNOSIS — Z171 Estrogen receptor negative status [ER-]: Secondary | ICD-10-CM

## 2012-10-10 DIAGNOSIS — N958 Other specified menopausal and perimenopausal disorders: Secondary | ICD-10-CM

## 2012-10-10 DIAGNOSIS — K1231 Oral mucositis (ulcerative) due to antineoplastic therapy: Secondary | ICD-10-CM

## 2012-10-10 DIAGNOSIS — C7A00 Malignant carcinoid tumor of unspecified site: Secondary | ICD-10-CM

## 2012-10-10 LAB — CBC WITH DIFFERENTIAL (CANCER CENTER ONLY)
BASO#: 0 10*3/uL (ref 0.0–0.2)
Eosinophils Absolute: 0.1 10*3/uL (ref 0.0–0.5)
HCT: 38.5 % (ref 34.8–46.6)
HGB: 12.3 g/dL (ref 11.6–15.9)
LYMPH#: 1.4 10*3/uL (ref 0.9–3.3)
MCHC: 31.9 g/dL — ABNORMAL LOW (ref 32.0–36.0)
MONO#: 0.9 10*3/uL (ref 0.1–0.9)
NEUT%: 44.3 % (ref 39.6–80.0)
WBC: 4.2 10*3/uL (ref 3.9–10.0)

## 2012-10-10 MED ORDER — MAGIC MOUTHWASH: 15.0000 mL | Freq: Three times a day (TID) | ORAL | Status: DC | PRN

## 2012-10-10 MED ORDER — GABAPENTIN 300 MG PO CAPS: 300.0000 mg | ORAL_CAPSULE | Freq: Three times a day (TID) | ORAL | Status: DC

## 2012-10-10 NOTE — Progress Notes (Signed)
Diagnoses: #1 stage IIb (T2, N1, M0) infiltrating ductal carcinoma of the left breast-triple negative. #2 Metastatic carcinoid.  Current therapy: #1.  She is status post 2 cycle of adjuvant chemotherapy utilizing carboplatin/Taxotere. #2.  Sandostatin is currently on hold.  Interim history: Amber Bautista presents today as a work in.  She is s/p her second cycle of adjuvant chemotherapy utilizing carboplatin/Taxotere.  Amber Bautista states, that she is having pretty bad hot flashes.  She states, that she missed her period.  Flashes, really seemed to hit, her at nighttime.  We did discuss one of the side effects of chemotherapy is chemotherapy induced menopausal state.  I informed her that we can't try her on Neurontin to see if this helps.  She is agreeable to this.  She also reports, that her tongue was all  "white", last night.  She was unsure what this could be in one to make sure.  She did not have thrush.  Her tongue is completely normal looking, now.  She does have a small ulcer, or on the upper right side of her molar.  She still continues to do the baking soda, salt water rinses.  I informed her to give her prescription for Magic mouthwash.  She is to use this 3-4 times a day as needed.  She reports, that she has a minimal appetite.  She still continues eat.  I informed her that she needs to eat small meals throughout the day.  She denies any nausea, vomiting, constipation, chest pain, shortness of breath, or cough.  She denies any fevers, chills, headaches, visual changes, or rashes.  She denies any lower leg swelling.  She denies any neuropathy.  She denies any obvious, or abnormal bleeding. Her blood work looks excellent.  She continues to receive Neulasta the day after chemotherapy.  She did have some arthralgias/myalgias, after her Neulasta injection.  She did take Aleve, which resolved the symptoms.  I explained to her that the symptoms.  She is experiencing is all chemotherapy related.  Review of  Systems: Constitutional:Negative for malaise/fatigue, fever, chills, weight loss, diaphoresis, activity change, appetite change, and unexpected weight change.  HEENT: Negative for double vision, blurred vision, visual loss, ear pain, tinnitus, congestion, rhinorrhea, epistaxis sore throat or sinus disease, oral pain/lesion, tongue soreness Respiratory: Negative for cough, chest tightness, shortness of breath, wheezing and stridor.  Cardiovascular: Negative for chest pain, palpitations, leg swelling, orthopnea, PND, DOE or claudication Gastrointestinal: Negative for nausea, vomiting, abdominal pain, diarrhea, constipation, blood in stool, melena, hematochezia, abdominal distention, anal bleeding, rectal pain, anorexia and hematemesis.  Genitourinary: Negative for dysuria, frequency, hematuria,  Musculoskeletal: Negative for myalgias, back pain, joint swelling, arthralgias and gait problem.  Skin: Negative for rash, color change, pallor and wound.  Neurological:. Negative for dizziness/light-headedness, tremors, seizures, syncope, facial asymmetry, speech difficulty, weakness, numbness, headaches and paresthesias.  Hematological: Negative for adenopathy. Does not bruise/bleed easily.  Psychiatric/Behavioral:  Negative for depression, no loss of interest in normal activity or change in sleep pattern.   Physical Exam: This is a very pleasant, obese, 42 year old, Hispanic female, in no obvious distress Vitals: Temperature 90.5 degrees, pulse 96, respirations 16, blood pressure 1:30/75.  Weight 298 pounds HEENT reveals a normocephalic, atraumatic skull, no scleral icterus, no oral lesions  Neck is supple without any cervical or supraclavicular adenopathy.  Lungs are clear to auscultation bilaterally. There are no wheezes, rales or rhonci Cardiac is tachy but regular rate and rhythm with a normal S1 and S2. There are no murmurs,  rubs, or bruits.  Abdomen is soft with good bowel sounds, there is no  palpable mass. There is no palpable hepatosplenomegaly. There is no palpable fluid wave.  Musculoskeletal no tenderness of the spine, ribs, or hips.  Extremities there are no clubbing, cyanosis, or edema.  Skin no petechia, purpura or ecchymosis Neurologic is nonfocal.  Laboratory Data: White count 4.2, hemoglobin 12.3, hematocrit 30.5.  Platelets 189,000  Current Outpatient Prescriptions on File Prior to Visit  Medication Sig Dispense Refill  . dexamethasone (DECADRON) 4 MG tablet       . HYDROcodone-acetaminophen (NORCO/VICODIN) 5-325 MG per tablet Take 1-2 tablets by mouth every 4 (four) hours as needed for pain.  50 tablet  1  . KLOR-CON M20 20 MEQ tablet Take 20 mEq by mouth daily.       Marland Kitchen LORazepam (ATIVAN) 1 MG tablet Place 1 tablet (1 mg total) under the tongue every 6 (six) hours as needed for anxiety. Or nausea and vomiting  30 tablet  3  . metoprolol tartrate (LOPRESSOR) 25 MG tablet Take 25 mg by mouth 2 (two) times daily.       . ondansetron (ZOFRAN) 8 MG tablet Take 1 tablet (8 mg total) by mouth 2 (two) times daily. Take one tablet in am and one tablet in pm beginning the day after chemotherapy x 3 days  20 tablet  3  . prochlorperazine (COMPAZINE) 10 MG tablet Take 1 tablet (10 mg total) by mouth every 6 (six) hours as needed.  30 tablet  0  . triamterene-hydrochlorothiazide (DYAZIDE) 37.5-25 MG per capsule Take 1 capsule by mouth every morning.        Assessment/Plan: This is a 42 year old, Hispanic female, with the following issues:  #1.  Chemotherapy induced menopausal state.  This is obviously, secondary to the chemotherapy.  I did give her prescription for Neurontin 300 mg to take 3 times a day.  #2.  Single mouth ulcer.  This is secondary to the chemotherapy.  I did give her a prescription for Magic mouthwash.  #3 stage IIb infiltrating ductal carcinoma of the left breast.  She is triple negative.  She is status post 2 cycles of carboplatin/Taxotere.  She does have  some side effects related to the chemotherapy.   #4.  Metastatic carcinoid.  For the time being, we have discontinued her Sandostatin.  She reports, that it really isn't doing anything for her, in terms of her symptoms.  We will continue to monitor this.  #3.  Followup.  Ms. Hameister will follow back up with [place] on January 16th, but before then should there be questions or concerns.

## 2012-10-10 NOTE — Progress Notes (Signed)
Pt called with c/o feeling hot then cold. Has been checking her temp and had not had a fever. She isn't feeling like herself. Explained that she could be experiencing chemo induced menopause. Also said her tongue is white. Pt to come in today to see Helayne Seminole, PA for evaluation.

## 2012-10-23 ENCOUNTER — Ambulatory Visit (HOSPITAL_BASED_OUTPATIENT_CLINIC_OR_DEPARTMENT_OTHER): Payer: Medicare Other

## 2012-10-23 ENCOUNTER — Ambulatory Visit (HOSPITAL_BASED_OUTPATIENT_CLINIC_OR_DEPARTMENT_OTHER): Payer: Medicare Other | Admitting: Hematology & Oncology

## 2012-10-23 ENCOUNTER — Other Ambulatory Visit (HOSPITAL_BASED_OUTPATIENT_CLINIC_OR_DEPARTMENT_OTHER): Payer: Medicare Other | Admitting: Lab

## 2012-10-23 VITALS — BP 152/87 | HR 102 | Temp 99.4°F | Resp 18 | Ht 68.0 in | Wt 304.0 lb

## 2012-10-23 DIAGNOSIS — C50419 Malignant neoplasm of upper-outer quadrant of unspecified female breast: Secondary | ICD-10-CM

## 2012-10-23 DIAGNOSIS — C7A00 Malignant carcinoid tumor of unspecified site: Secondary | ICD-10-CM

## 2012-10-23 DIAGNOSIS — H04209 Unspecified epiphora, unspecified lacrimal gland: Secondary | ICD-10-CM

## 2012-10-23 DIAGNOSIS — Z171 Estrogen receptor negative status [ER-]: Secondary | ICD-10-CM

## 2012-10-23 DIAGNOSIS — Z5111 Encounter for antineoplastic chemotherapy: Secondary | ICD-10-CM

## 2012-10-23 LAB — COMPREHENSIVE METABOLIC PANEL
AST: 21 U/L (ref 0–37)
Alkaline Phosphatase: 73 U/L (ref 39–117)
BUN: 18 mg/dL (ref 6–23)
Calcium: 9.3 mg/dL (ref 8.4–10.5)
Chloride: 104 mEq/L (ref 96–112)
Creatinine, Ser: 0.72 mg/dL (ref 0.50–1.10)
Glucose, Bld: 117 mg/dL — ABNORMAL HIGH (ref 70–99)

## 2012-10-23 LAB — CBC WITH DIFFERENTIAL (CANCER CENTER ONLY)
BASO#: 0.1 10*3/uL (ref 0.0–0.2)
EOS%: 0.1 % (ref 0.0–7.0)
HCT: 35.2 % (ref 34.8–46.6)
HGB: 11.5 g/dL — ABNORMAL LOW (ref 11.6–15.9)
MCH: 28.8 pg (ref 26.0–34.0)
MCHC: 32.7 g/dL (ref 32.0–36.0)
MCV: 88 fL (ref 81–101)
MONO%: 11.1 % (ref 0.0–13.0)
NEUT%: 73.3 % (ref 39.6–80.0)

## 2012-10-23 MED ORDER — HEPARIN SOD (PORK) LOCK FLUSH 100 UNIT/ML IV SOLN
250.0000 [IU] | Freq: Once | INTRAVENOUS | Status: DC | PRN
  Filled 2012-10-23: qty 5

## 2012-10-23 MED ORDER — ONDANSETRON 16 MG/50ML IVPB (CHCC)
16.0000 mg | Freq: Once | INTRAVENOUS | Status: AC
  Administered 2012-10-23: 16 mg via INTRAVENOUS

## 2012-10-23 MED ORDER — ALTEPLASE 2 MG IJ SOLR
2.0000 mg | Freq: Once | INTRAMUSCULAR | Status: DC | PRN
  Filled 2012-10-23: qty 2

## 2012-10-23 MED ORDER — SODIUM CHLORIDE 0.9 % IJ SOLN
3.0000 mL | INTRAMUSCULAR | Status: DC | PRN
  Filled 2012-10-23: qty 10

## 2012-10-23 MED ORDER — OCTREOTIDE ACETATE 30 MG IM KIT: 30.0000 mg | PACK | Freq: Once | INTRAMUSCULAR | Status: DC

## 2012-10-23 MED ORDER — SODIUM CHLORIDE 0.9 % IV SOLN
Freq: Once | INTRAVENOUS | Status: AC
  Administered 2012-10-23: 09:00:00 via INTRAVENOUS

## 2012-10-23 MED ORDER — TOBRAMYCIN-DEXAMETHASONE 0.3-0.1 % OP SUSP: 1.0000 [drp] | OPHTHALMIC | Status: DC

## 2012-10-23 MED ORDER — COLD PACK MISC ONCOLOGY
1.0000 | Freq: Once | Status: DC | PRN
  Filled 2012-10-23: qty 1

## 2012-10-23 MED ORDER — DEXAMETHASONE SODIUM PHOSPHATE 4 MG/ML IJ SOLN
20.0000 mg | Freq: Once | INTRAMUSCULAR | Status: AC
  Administered 2012-10-23: 20 mg via INTRAVENOUS

## 2012-10-23 MED ORDER — CIPROFLOXACIN HCL 500 MG PO TABS: 500.0000 mg | ORAL_TABLET | Freq: Two times a day (BID) | ORAL | Status: DC

## 2012-10-23 MED ORDER — SODIUM CHLORIDE 0.9 % IJ SOLN
10.0000 mL | INTRAMUSCULAR | Status: DC | PRN
  Administered 2012-10-23: 10 mL
  Filled 2012-10-23: qty 10

## 2012-10-23 MED ORDER — SODIUM CHLORIDE 0.9 % IV SOLN
900.0000 mg | Freq: Once | INTRAVENOUS | Status: AC
  Administered 2012-10-23: 900 mg via INTRAVENOUS
  Filled 2012-10-23: qty 90

## 2012-10-23 MED ORDER — DOCETAXEL CHEMO INJECTION 160 MG/16ML
75.0000 mg/m2 | Freq: Once | INTRAVENOUS | Status: AC
  Administered 2012-10-23: 200 mg via INTRAVENOUS
  Filled 2012-10-23: qty 20

## 2012-10-23 MED ORDER — HEPARIN SOD (PORK) LOCK FLUSH 100 UNIT/ML IV SOLN
500.0000 [IU] | Freq: Once | INTRAVENOUS | Status: AC | PRN
  Administered 2012-10-23: 500 [IU]
  Filled 2012-10-23: qty 5

## 2012-10-23 NOTE — Progress Notes (Signed)
This office note has been dictated.

## 2012-10-23 NOTE — Patient Instructions (Signed)
[place] Discharge Instructions for Patients Receiving Chemotherapy  Today you received the following chemotherapy agents Carboplatin, Taxotere To help prevent nausea and vomiting after your treatment, we encourage you to take your nausea medication  1)Zofran 8mg  daily Days 2,3,4 post chemo. Take this for nausea beginning 09/13/12 2)Ativan 1 mg by mouth every 6 hours as needed for nausea/vomiting 3)Compazine 10 mg by mouth every 6 hours as needed for Nausea and vomiting 4) Decadron (Dexamethasone) Take as prescribed by Dr. Marin Olp   If you develop nausea and vomiting that is not controlled by your nausea medication, call the clinic at  [phone].  If it is after clinic hours call our office to be transferred to the main [facility] or call [phone].  You can also call your family physician or the  the Emergency Department.   BELOW ARE SYMPTOMS THAT SHOULD BE REPORTED IMMEDIATELY:  *FEVER GREATER THAN 100.5 F  *CHILLS WITH OR WITHOUT FEVER  NAUSEA AND VOMITING THAT IS NOT CONTROLLED WITH YOUR NAUSEA MEDICATION  *UNUSUAL SHORTNESS OF BREATH  *UNUSUAL BRUISING OR BLEEDING  TENDERNESS IN MOUTH AND THROAT WITH OR WITHOUT PRESENCE OF ULCERS  *URINARY PROBLEMS  *BOWEL PROBLEMS  UNUSUAL RASH Items with * indicate a potential emergency and should be followed up as soon as possible.  One of the nurses will contact you 24 hours after your treatment. Please let the nurse know about any problems that you may have experienced. Feel free to call the clinic you have any questions or concerns. The clinic phone number is [phone].   I have been informed and understand all the instructions given to me. I know to contact the clinic, my physician, or go to the Emergency Department if any problems should occur. I do not have any questions at this time, but understand that I may call the clinic during office hours   should I have any questions or need assistance in  obtaining follow up care.    __________________________________________  _____________  __________ Signature of Patient or Authorized Representative            Date                   Time    __________________________________________ Nurse's Signature   Carboplatin injection What is this medicine? CARBOPLATIN (KAR boe pla tin) is a chemotherapy drug. It targets fast dividing cells, like cancer cells, and causes these cells to die. This medicine is used to treat ovarian cancer and many other cancers. This medicine may be used for other purposes; ask your health care provider or pharmacist if you have questions. What should I tell my health care provider before I take this medicine? They need to know if you have any of these conditions: -blood disorders -hearing problems -kidney disease -recent or ongoing radiation therapy -an unusual or allergic reaction to carboplatin, cisplatin, other chemotherapy, other medicines, foods, dyes, or preservatives -pregnant or trying to get pregnant -breast-feeding How should I use this medicine? This drug is usually given as an infusion into a vein. It is administered in a hospital or clinic by a specially trained health care professional. Talk to your pediatrician regarding the use of this medicine in children. Special care may be needed. Overdosage: If you think you have taken too much of this medicine contact a poison control center or emergency room at once. NOTE: This medicine is only for you. Do not share this medicine with others. What if I miss  a dose? It is important not to miss a dose. Call your doctor or health care professional if you are unable to keep an appointment. What may interact with this medicine? -medicines for seizures -medicines to increase blood counts like filgrastim, pegfilgrastim, sargramostim -some antibiotics like amikacin, gentamicin, neomycin, streptomycin, tobramycin -vaccines Talk to your doctor or health care  professional before taking any of these medicines: -acetaminophen -aspirin -ibuprofen -ketoprofen -naproxen This list may not describe all possible interactions. Give your health care provider a list of all the medicines, herbs, non-prescription drugs, or dietary supplements you use. Also tell them if you smoke, drink alcohol, or use illegal drugs. Some items may interact with your medicine. What should I watch for while using this medicine? Your condition will be monitored carefully while you are receiving this medicine. You will need important blood work done while you are taking this medicine. This drug may make you feel generally unwell. This is not uncommon, as chemotherapy can affect healthy cells as well as cancer cells. Report any side effects. Continue your course of treatment even though you feel ill unless your doctor tells you to stop. In some cases, you may be given additional medicines to help with side effects. Follow all directions for their use. Call your doctor or health care professional for advice if you get a fever, chills or sore throat, or other symptoms of a cold or flu. Do not treat yourself. This drug decreases your body's ability to fight infections. Try to avoid being around people who are sick. This medicine may increase your risk to bruise or bleed. Call your doctor or health care professional if you notice any unusual bleeding. Be careful brushing and flossing your teeth or using a toothpick because you may get an infection or bleed more easily. If you have any dental work done, tell your dentist you are receiving this medicine. Avoid taking products that contain aspirin, acetaminophen, ibuprofen, naproxen, or ketoprofen unless instructed by your doctor. These medicines may hide a fever. Do not become pregnant while taking this medicine. Women should inform their doctor if they wish to become pregnant or think they might be pregnant. There is a potential for serious side  effects to an unborn child. Talk to your health care professional or pharmacist for more information. Do not breast-feed an infant while taking this medicine. What side effects may I notice from receiving this medicine? Side effects that you should report to your doctor or health care professional as soon as possible: -allergic reactions like skin rash, itching or hives, swelling of the face, lips, or tongue -signs of infection - fever or chills, cough, sore throat, pain or difficulty passing urine -signs of decreased platelets or bleeding - bruising, pinpoint red spots on the skin, black, tarry stools, nosebleeds -signs of decreased red blood cells - unusually weak or tired, fainting spells, lightheadedness -breathing problems -changes in hearing -changes in vision -chest pain -high blood pressure -low blood counts - This drug may decrease the number of white blood cells, red blood cells and platelets. You may be at increased risk for infections and bleeding. -nausea and vomiting -pain, swelling, redness or irritation at the injection site -pain, tingling, numbness in the hands or feet -problems with balance, talking, walking -trouble passing urine or change in the amount of urine Side effects that usually do not require medical attention (report to your doctor or health care professional if they continue or are bothersome): -hair loss -loss of appetite -metallic taste in  the mouth or changes in taste This list may not describe all possible side effects. Call your doctor for medical advice about side effects. You may report side effects to FDA at 1-800-FDA-1088. Where should I keep my medicine? This drug is given in a hospital or clinic and will not be stored at home. NOTE: This sheet is a summary. It may not cover all possible information. If you have questions about this medicine, talk to your doctor, pharmacist, or health care provider.  2012, [employer]vier/Gold Standard. (12/31/2007 2:38:05  PM)Docetaxel injection What is this medicine?    DOCETAXEL (doe se TAX el) is a chemotherapy drug. It targets fast dividing cells, like cancer cells, and causes these cells to die. This medicine is used to treat many types of cancers like breast cancer, certain stomach cancers, head and neck cancer, lung cancer, and prostate cancer. This medicine may be used for other purposes; ask your health care provider or pharmacist if you have questions. What should I tell my health care provider before I take this medicine? They need to know if you have any of these conditions: -infection (especially a virus infection such as chickenpox, cold sores, or herpes) -liver disease -low blood counts, like low white cell, platelet, or red cell counts -an unusual or allergic reaction to docetaxel, polysorbate 80, other chemotherapy agents, other medicines, foods, dyes, or preservatives -pregnant or trying to get pregnant -breast-feeding How should I use this medicine? This drug is given as an infusion into a vein. It is administered in a hospital or clinic by a specially trained health care professional. Talk to your pediatrician regarding the use of this medicine in children. Special care may be needed. Overdosage: If you think you have taken too much of this medicine contact a poison control center or emergency room at once. NOTE: This medicine is only for you. Do not share this medicine with others. What if I miss a dose? It is important not to miss your dose. Call your doctor or health care professional if you are unable to keep an appointment. What may interact with this medicine? -cyclosporine -erythromycin -ketoconazole -medicines to increase blood counts like filgrastim, pegfilgrastim, sargramostim -vaccines Talk to your doctor or health care professional before taking any of these medicines: -acetaminophen -aspirin -ibuprofen -ketoprofen -naproxen This list may not describe all possible  interactions. Give your health care provider a list of all the medicines, herbs, non-prescription drugs, or dietary supplements you use. Also tell them if you smoke, drink alcohol, or use illegal drugs. Some items may interact with your medicine. What should I watch for while using this medicine? Your condition will be monitored carefully while you are receiving this medicine. You will need important blood work done while you are taking this medicine. This drug may make you feel generally unwell. This is not uncommon, as chemotherapy can affect healthy cells as well as cancer cells. Report any side effects. Continue your course of treatment even though you feel ill unless your doctor tells you to stop. In some cases, you may be given additional medicines to help with side effects. Follow all directions for their use. Call your doctor or health care professional for advice if you get a fever, chills or sore throat, or other symptoms of a cold or flu. Do not treat yourself. This drug decreases your body's ability to fight infections. Try to avoid being around people who are sick. This medicine may increase your risk to bruise or bleed. Call your doctor or  health care professional if you notice any unusual bleeding. Be careful brushing and flossing your teeth or using a toothpick because you may get an infection or bleed more easily. If you have any dental work done, tell your dentist you are receiving this medicine. Avoid taking products that contain aspirin, acetaminophen, ibuprofen, naproxen, or ketoprofen unless instructed by your doctor. These medicines may hide a fever. Do not become pregnant while taking this medicine. Women should inform their doctor if they wish to become pregnant or think they might be pregnant. There is a potential for serious side effects to an unborn child. Talk to your health care professional or pharmacist for more information. Do not breast-feed an infant while taking this  medicine. What side effects may I notice from receiving this medicine? Side effects that you should report to your doctor or health care professional as soon as possible: -allergic reactions like skin rash, itching or hives, swelling of the face, lips, or tongue -low blood counts - This drug may decrease the number of white blood cells, red blood cells and platelets. You may be at increased risk for infections and bleeding. -signs of infection - fever or chills, cough, sore throat, pain or difficulty passing urine -signs of decreased platelets or bleeding - bruising, pinpoint red spots on the skin, black, tarry stools, nosebleeds -signs of decreased red blood cells - unusually weak or tired, fainting spells, lightheadedness -breathing problems -fast or irregular heartbeat -low blood pressure -mouth sores -nausea and vomiting -pain, swelling, redness or irritation at the injection site -pain, tingling, numbness in the hands or feet -swelling of the ankle, feet, hands -weight gain Side effects that usually do not require medical attention (report to your prescriber or health care professional if they continue or are bothersome): -bone pain -complete hair loss including hair on your head, underarms, pubic hair, eyebrows, and eyelashes -diarrhea -excessive tearing -changes in the color of fingernails -loosening of the fingernails -nausea -muscle pain -red flush to skin -sweating -weak or tired This list may not describe all possible side effects. Call your doctor for medical advice about side effects. You may report side effects to FDA at 1-800-FDA-1088. Where should I keep my medicine? This drug is given in a hospital or clinic and will not be stored at home. NOTE: This sheet is a summary. It may not cover all possible information. If you have questions about this medicine, talk to your doctor, pharmacist, or health care provider.  2012, Elsevier/Gold Standard. (09/07/2008 11:52:10 AM)

## 2012-10-24 ENCOUNTER — Ambulatory Visit: Payer: Medicare Other

## 2012-10-24 NOTE — Progress Notes (Signed)
CC:   Amber Bautista. Amber Bautista, M.D.  DIAGNOSES: 1. 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 2. Metastatic carcinoid.  CURRENT THERAPY: 1. The patient is status post 2 cycles of carboplatin/Taxotere. 2. Sandostatin, on hold.  INTERIM HISTORY:  Amber Bautista comes in for a followup.  She is due for her 3rd cycle of chemotherapy today.  She complains of her eyes.  This probably is from the Taxotere.  I will see what we can do and try to give her some eyedrops for this.  She does have some hot flashes.  This is no surprise given the chemotherapy and that she is probably going into a chemotherapy-induced menopause.  This, hopefully, will not be permanent, but at 42 years old, this certainly could be.  She says that the Neulasta really causes a lot of issues for her.  I think we could probably try to hold off on Neulasta for this cycle and see how she does.  She has had no diarrhea.  She has had no cough.  She has had occasional leg swelling.  PHYSICAL EXAMINATION:  General:  This is an obese Hispanic female in no obvious distress.  Vital signs:  Temperature 99.4, pulse 102, respiratory rate 18, blood pressure 152/87.  Weight is 304.  Head and neck:  Normocephalic, atraumatic skull.  There are no ocular or oral lesions.  I see no conjunctival inflammation.  Pupils react appropriately.  She has no adenopathy in the neck.  Lungs:  Clear bilaterally.  Cardiac:  Tachycardic, but regular.  She has no murmurs, rubs, or bruits.  Abdomen:  Soft, with good bowel sounds.  There is no palpable abdominal mass.  There is no fluid wave.  There is no palpable hepatosplenomegaly.  Extremities:  No clubbing, cyanosis or edema.  She has good range motion of her joints.  Skin:  No rashes, ecchymosis, or petechia.  Neurological:  No focal neurological deficits.  LABORATORY STUDIES:  White cell count is 12.7, hemoglobin 11.5, hematocrit 35.2, platelet count  258,000.  IMPRESSION:  Amber Bautista is a 42 year old Hispanic female with stage IIB ductal carcinoma of the left breast.  She underwent lumpectomy.  She had 1 positive lymph node.  Again, we will see what we can do about the eyes.  Again, I suppose this is probably from the Taxotere.  Again, we will hold off on the Neulasta.  Hopefully, she will be able to get through without any problems.  I will give her some antibiotic just for prophylaxis while she is off the Neulasta.  We will go ahead and treat her today.  We will plan for her 4th and final cycle of chemo in 3 weeks.    ______________________________ Volanda Napoleon, M.D. PRE/MEDQ  D:  10/23/2012  T:  10/24/2012  Job:  [id number]

## 2012-10-24 NOTE — Progress Notes (Unsigned)
Dr. Marin Olp

## 2012-11-14 ENCOUNTER — Other Ambulatory Visit (HOSPITAL_BASED_OUTPATIENT_CLINIC_OR_DEPARTMENT_OTHER): Payer: Medicare Other | Admitting: Lab

## 2012-11-14 ENCOUNTER — Ambulatory Visit (HOSPITAL_BASED_OUTPATIENT_CLINIC_OR_DEPARTMENT_OTHER): Payer: Medicare Other

## 2012-11-14 ENCOUNTER — Ambulatory Visit (HOSPITAL_BASED_OUTPATIENT_CLINIC_OR_DEPARTMENT_OTHER): Payer: Medicare Other | Admitting: Medical

## 2012-11-14 VITALS — BP 133/89 | HR 100 | Temp 98.1°F | Resp 18 | Ht 68.0 in | Wt 294.0 lb

## 2012-11-14 DIAGNOSIS — Z171 Estrogen receptor negative status [ER-]: Secondary | ICD-10-CM

## 2012-11-14 DIAGNOSIS — C50419 Malignant neoplasm of upper-outer quadrant of unspecified female breast: Secondary | ICD-10-CM

## 2012-11-14 DIAGNOSIS — C7A00 Malignant carcinoid tumor of unspecified site: Secondary | ICD-10-CM

## 2012-11-14 DIAGNOSIS — H04209 Unspecified epiphora, unspecified lacrimal gland: Secondary | ICD-10-CM

## 2012-11-14 DIAGNOSIS — E876 Hypokalemia: Secondary | ICD-10-CM

## 2012-11-14 DIAGNOSIS — Z5111 Encounter for antineoplastic chemotherapy: Secondary | ICD-10-CM

## 2012-11-14 LAB — CBC WITH DIFFERENTIAL (CANCER CENTER ONLY)
BASO#: 0.1 10*3/uL (ref 0.0–0.2)
EOS%: 0 % (ref 0.0–7.0)
HGB: 10.3 g/dL — ABNORMAL LOW (ref 11.6–15.9)
LYMPH%: 15.9 % (ref 14.0–48.0)
MCH: 28.7 pg (ref 26.0–34.0)
MCHC: 32.1 g/dL (ref 32.0–36.0)
MCV: 89 fL (ref 81–101)
MONO%: 6 % (ref 0.0–13.0)
NEUT#: 8.4 10*3/uL — ABNORMAL HIGH (ref 1.5–6.5)
Platelets: 305 10*3/uL (ref 145–400)

## 2012-11-14 LAB — CMP (CANCER CENTER ONLY)
ALT(SGPT): 20 U/L (ref 10–47)
AST: 21 U/L (ref 11–38)
Albumin: 3.1 g/dL — ABNORMAL LOW (ref 3.3–5.5)
Alkaline Phosphatase: 61 U/L (ref 26–84)
Potassium: 2.7 mEq/L — CL (ref 3.3–4.7)
Sodium: 138 mEq/L (ref 128–145)
Total Bilirubin: 0.6 mg/dl (ref 0.20–1.60)
Total Protein: 8.1 g/dL (ref 6.4–8.1)

## 2012-11-14 MED ORDER — SODIUM CHLORIDE 0.9 % IV SOLN
900.0000 mg | Freq: Once | INTRAVENOUS | Status: AC
  Administered 2012-11-14: 900 mg via INTRAVENOUS
  Filled 2012-11-14: qty 90

## 2012-11-14 MED ORDER — DEXAMETHASONE SODIUM PHOSPHATE 4 MG/ML IJ SOLN
20.0000 mg | Freq: Once | INTRAMUSCULAR | Status: AC
  Administered 2012-11-14: 20 mg via INTRAVENOUS

## 2012-11-14 MED ORDER — SODIUM CHLORIDE 0.9 % IJ SOLN
10.0000 mL | INTRAMUSCULAR | Status: DC | PRN
  Administered 2012-11-14: 10 mL
  Filled 2012-11-14: qty 10

## 2012-11-14 MED ORDER — ONDANSETRON 16 MG/50ML IVPB (CHCC)
16.0000 mg | Freq: Once | INTRAVENOUS | Status: AC
  Administered 2012-11-14: 16 mg via INTRAVENOUS

## 2012-11-14 MED ORDER — OCTREOTIDE ACETATE 30 MG IM KIT: 30.0000 mg | PACK | Freq: Once | INTRAMUSCULAR | Status: DC

## 2012-11-14 MED ORDER — SODIUM CHLORIDE 0.9 % IV SOLN
INTRAVENOUS | Status: DC
  Administered 2012-11-14: 14:00:00 via INTRAVENOUS
  Filled 2012-11-14: qty 500

## 2012-11-14 MED ORDER — SODIUM CHLORIDE 0.9 % IV SOLN
Freq: Once | INTRAVENOUS | Status: AC
  Administered 2012-11-14: 11:00:00 via INTRAVENOUS

## 2012-11-14 MED ORDER — DEXTROSE 5 % IV SOLN
75.0000 mg/m2 | Freq: Once | INTRAVENOUS | Status: AC
  Administered 2012-11-14: 200 mg via INTRAVENOUS
  Filled 2012-11-14: qty 20

## 2012-11-14 MED ORDER — HEPARIN SOD (PORK) LOCK FLUSH 100 UNIT/ML IV SOLN
500.0000 [IU] | Freq: Once | INTRAVENOUS | Status: AC | PRN
  Administered 2012-11-14: 500 [IU]
  Filled 2012-11-14: qty 5

## 2012-11-14 NOTE — Progress Notes (Signed)
Diagnoses: #1 stage IIb (T2, N1, M0) infiltrating ductal carcinoma of the left breast-triple negative. #2 Metastatic carcinoid.  Current therapy: #1.  She is status post 3cycles of adjuvant chemotherapy utilizing carboplatin/Taxotere. #2.  Sandostatin is currently on hold.  Interim history: Amber Bautista presents today for followup office visit.  She is s/p her third cycle of adjuvant chemotherapy utilizing carboplatin/Taxotere.  She does complain of some mild neuropathy bilaterally and her toes.  This does not affect her fine motor functioning or activities of daily living.  She does have some intermittent tingling of the left arm.  She still continues on TobraDex eyedrops for dry eyes.  She still continues to have a decreased appetite.  She does have some intermittent nausea, but utilizes her anti-emetics.  She's not reporting much diarrhea.  She does have some hot flashes.  She is on Neurontin.  We did decide with her third cycle, not to give Neulasta, secondary to extreme bone pain.  We will hold off on the Neulasta for this cycle as well.  She is on Cipro for prophylaxis.  She denies any vomiting, constipation, chest pain, shortness of breath, or cough.  She denies any fevers, chills, headaches, visual changes, or rashes.  She denies any lower leg swelling. She denies any obvious, or abnormal bleeding. Her blood work looks excellent.  She will go ahead and proceed with her fourth cycle today. Of note, her potassium is quite low at 2.7, today.  We will go ahead and give her 40 mEq of IV potassium today.  Review of Systems: Constitutional:Negative for malaise/fatigue, fever, chills, weight loss, diaphoresis, activity change, appetite change, and unexpected weight change.  HEENT: Negative for double vision, blurred vision, visual loss, ear pain, tinnitus, congestion, rhinorrhea, epistaxis sore throat or sinus disease, oral pain/lesion, tongue soreness Respiratory: Negative for cough, chest tightness,  shortness of breath, wheezing and stridor.  Cardiovascular: Negative for chest pain, palpitations, leg swelling, orthopnea, PND, DOE or claudication Gastrointestinal: Negative for nausea, vomiting, abdominal pain, diarrhea, constipation, blood in stool, melena, hematochezia, abdominal distention, anal bleeding, rectal pain, anorexia and hematemesis.  Genitourinary: Negative for dysuria, frequency, hematuria,  Musculoskeletal: Negative for myalgias, back pain, joint swelling, arthralgias and gait problem.  Skin: Negative for rash, color change, pallor and wound.  Neurological:. Negative for dizziness/light-headedness, tremors, seizures, syncope, facial asymmetry, speech difficulty, weakness, numbness, headaches and paresthesias.  Hematological: Negative for adenopathy. Does not bruise/bleed easily.  Psychiatric/Behavioral:  Negative for depression, no loss of interest in normal activity or change in sleep pattern.   Physical Exam: This is a very pleasant, obese, 42 year old, Hispanic female, in no obvious distress Vitals: Temperature 98.1 degrees, pulse 100, respirations 18, blood pressure 133/89, weight 294 pounds HEENT reveals a normocephalic, atraumatic skull, no scleral icterus, no oral lesions  Neck is supple without any cervical or supraclavicular adenopathy.  Lungs are clear to auscultation bilaterally. There are no wheezes, rales or rhonci Cardiac is tachy but regular rate and rhythm with a normal S1 and S2. There are no murmurs, rubs, or bruits.  Abdomen is soft with good bowel sounds, there is no palpable mass. There is no palpable hepatosplenomegaly. There is no palpable fluid wave.  Musculoskeletal no tenderness of the spine, ribs, or hips.  Extremities there are no clubbing, cyanosis, or edema.  Skin no petechia, purpura or ecchymosis Neurologic is nonfocal.  Laboratory Data: White count 10.9, hemoglobin 10.3, hematocrit 32.1, platelets 305,000 Sodium 138 potassium 2.7, creatinine  1.3, BUN 19, AST 21, ALT 20, albumin  3.1  Current Outpatient Prescriptions on File Prior to Visit  Medication Sig Dispense Refill  . Alum & Mag Hydroxide-Simeth (MAGIC MOUTHWASH) SOLN Take 15 mLs by mouth 3 (three) times daily as needed.  240 mL  2  . ciprofloxacin (CIPRO) 500 MG tablet Take 1 tablet (500 mg total) by mouth 2 (two) times daily.  42 tablet  1  . dexamethasone (DECADRON) 4 MG tablet Take 4 mg by mouth. 2 day before chemo and then 1 day of chemo and for 5 days after chemo.      . gabapentin (NEURONTIN) 300 MG capsule Take 1 capsule (300 mg total) by mouth 3 (three) times daily.  90 capsule  1  . LORazepam (ATIVAN) 1 MG tablet Place 1 tablet (1 mg total) under the tongue every 6 (six) hours as needed for anxiety. Or nausea and vomiting  30 tablet  3  . ondansetron (ZOFRAN) 8 MG tablet Take 1 tablet (8 mg total) by mouth 2 (two) times daily. Take one tablet in am and one tablet in pm beginning the day after chemotherapy x 3 days  20 tablet  3  . prochlorperazine (COMPAZINE) 10 MG tablet Take 1 tablet (10 mg total) by mouth every 6 (six) hours as needed.  30 tablet  0  . tobramycin-dexamethasone (TOBRADEX) ophthalmic solution Place 1 drop into both eyes every 4 (four) hours while awake.  5 mL  0  . triamterene-hydrochlorothiazide (DYAZIDE) 37.5-25 MG per capsule Take 1 capsule by mouth every morning.         Assessment/Plan: 42 year old, Hispanic female, with the following issues:  #1. stage IIb infiltrating ductal carcinoma of the left breast.  She is triple negative.  She underwent a lumpectomy.  She did have one positive lymph node. She is status post 3 cycles of carboplatin/Taxotere.  She does have some side effects related to the chemotherapy  #2.  Chemotherapy induced menopausal state.  This is obviously, secondary to the chemotherapy.  I did give her prescription for Neurontin 300 mg to take 3 times a day.  #3. Eye Tearing.  This is secondary to the, Taxotere.  She  will continue on TobraDex eyedrops.  #4.  Metastatic carcinoid.  For the time being, we have discontinued her Sandostatin.  She reports, that it really isn't doing anything for her, in terms of her symptoms.  We will continue to monitor this.  #5.  Hypokalemia.  Her potassium is quite low.  Today.  We will go ahead and give her 40 mEq IV potassium here in the office today.  #5.  Followup.  Amber Bautista will follow back up with [place] on , but before then should there be questions or concerns.

## 2012-11-14 NOTE — Patient Instructions (Addendum)
[facility] Discharge Instructions for Patients Receiving Chemotherapy  Today you received the following chemotherapy agents Taxotere, Carboplatin   To help prevent nausea and vomiting after your treatment, we encourage you to take your nausea medication for   nausea and vomiting that is not controlled by your nausea medication, call the clinic. If it is after clinic hours your family physician or the after hours number for the clinic or go to the Emergency Department.   BELOW ARE SYMPTOMS THAT SHOULD BE REPORTED IMMEDIATELY:  *FEVER GREATER THAN 100.5 F  *CHILLS WITH OR WITHOUT FEVER  NAUSEA AND VOMITING THAT IS NOT CONTROLLED WITH YOUR NAUSEA MEDICATION  *UNUSUAL SHORTNESS OF BREATH  *UNUSUAL BRUISING OR BLEEDING  TENDERNESS IN MOUTH AND THROAT WITH OR WITHOUT PRESENCE OF ULCERS  *URINARY PROBLEMS  *BOWEL PROBLEMS  UNUSUAL RASH Items with * indicate a potential emergency and should be followed up as soon as possible.  One of the nurses will contact you 24 hours after your treatment. Please let the nurse know about any problems that you may have experienced. Feel free to call the clinic you have any questions or concerns. The clinic phone number is [phone].   I have been informed and understand all the instructions given to me. I know to contact the clinic, my physician, or go to the Emergency Department if any problems should occur. I do not have any questions at this time, but understand that I may call the clinic during office hours   should I have any questions or need assistance in obtaining follow up care.    __________________________________________  _____________  __________ Signature of Patient or Authorized Representative            Date                   Time    __________________________________________ Nurse's Signature   Hypokalemia Hypokalemia means a low potassium level in the blood.Potassium is an electrolyte that helps regulate  the amount of fluid in the body. It also stimulates muscle contraction and maintains a stable acid-base balance.Most of the body's potassium is inside of cells, and only a very small amount is in the blood. Because the amount in the blood is so small, minor changes can have big effects. PREPARATION FOR TEST Testing for potassium requires taking a blood sample taken by needle from a vein in the arm. The skin is cleaned thoroughly before the sample is drawn. There is no other special preparation needed. NORMAL VALUES Potassium levels below 3.5 mEq/L are abnormally low. Levels above 5.1 mEq/L are abnormally high. Ranges for normal findings may vary among different laboratories and hospitals. You should always check with your doctor after having lab work or other tests done to discuss the meaning of your test results and whether your values are considered within normal limits. MEANING OF TEST  Your caregiver will go over the test results with you and discuss the importance and meaning of your results, as well as treatment options and the need for additional tests, if necessary. A potassium level is frequently part of a routine medical exam. It is usually included as part of a whole "panel" of tests for several blood salts (such as Sodium and Chloride). It may be done as part of follow-up when a low potassium level was found in the past or other blood salts are suspected of being out of balance. A low potassium level might be suspected if you have one or  more of the following:  Symptoms of weakness.  Abnormal heart rhythms.  High blood pressure and are taking medication to control this, especially water pills (diuretics).  Kidney disease that can affect your potassium level .  Diabetes requiring the use of insulin. The potassium may fall after taking insulin, especially if the diabetes had been out of control for a while.  A condition requiring the use of cortisone-type medication or certain types  of antibiotics.  Vomiting and/or diarrhea for more than a day or two.  A stomach or intestinal condition that may not permit appropriate absorption of potassium.  Fainting episodes.  Mental confusion. OBTAINING TEST RESULTS It is your responsibility to obtain your test results. Ask the lab or department performing the test when and how you will get your results.  Please contact your caregiver directly if you have not received the results within one week. At that time, ask if there is anything different or new you should be doing in relation to the results. TREATMENT Hypokalemia can be treated with potassium supplements taken by mouth and/or adjustments in your current medications. A diet high in potassium is also helpful. Foods with high potassium content are:  Peas, lentils, lima beans, nuts, and dried fruit.  Whole grain and bran cereals and breads.  Fresh fruit, vegetables (bananas, cantaloupe, grapefruit, oranges, tomatoes, honeydew melons, potatoes).  Orange and tomato juices.  Meats. If potassium supplement has been prescribed for you today or your medications have been adjusted, see your personal caregiver in time02 for a re-check. SEEK MEDICAL CARE IF:  There is a feeling of worsening weakness.  You experience repeated chest palpitations.  You are diabetic and having difficulty keeping your blood sugars in the normal range.  You are experiencing vomiting and/or diarrhea.  You are having difficulty with any of your regular medications. SEEK IMMEDIATE MEDICAL CARE IF:  You experience chest pain, shortness of breath, or episodes of dizziness.  You have been having vomiting or diarrhea for more than 2 days.  You have a fainting episode. MAKE SURE YOU:   Understand these instructions.  Will watch your condition.  Will get help right away if you are not doing well or get worse. Document Released: 09/25/2005 Document Revised: 12/18/2011 Document Reviewed:  09/05/2008 [facility] Patient Information 2013 [place].

## 2012-11-15 ENCOUNTER — Ambulatory Visit: Payer: Medicare Other

## 2012-12-05 ENCOUNTER — Ambulatory Visit: Payer: Medicare Other

## 2012-12-05 ENCOUNTER — Other Ambulatory Visit (HOSPITAL_BASED_OUTPATIENT_CLINIC_OR_DEPARTMENT_OTHER): Payer: Medicare Other | Admitting: Lab

## 2012-12-05 ENCOUNTER — Ambulatory Visit (HOSPITAL_BASED_OUTPATIENT_CLINIC_OR_DEPARTMENT_OTHER): Payer: Medicare Other | Admitting: Hematology & Oncology

## 2012-12-05 VITALS — BP 129/81 | HR 108 | Temp 98.1°F | Resp 18 | Ht 68.0 in | Wt 293.0 lb

## 2012-12-05 DIAGNOSIS — C50419 Malignant neoplasm of upper-outer quadrant of unspecified female breast: Secondary | ICD-10-CM

## 2012-12-05 DIAGNOSIS — C50411 Malignant neoplasm of upper-outer quadrant of right female breast: Secondary | ICD-10-CM

## 2012-12-05 LAB — CBC WITH DIFFERENTIAL (CANCER CENTER ONLY)
BASO%: 0.5 % (ref 0.0–2.0)
HCT: 24.4 % — ABNORMAL LOW (ref 34.8–46.6)
LYMPH%: 41.7 % (ref 14.0–48.0)
MCH: 29.7 pg (ref 26.0–34.0)
MCV: 94 fL (ref 81–101)
MONO#: 0.8 10*3/uL (ref 0.1–0.9)
MONO%: 13.3 % — ABNORMAL HIGH (ref 0.0–13.0)
NEUT%: 43.6 % (ref 39.6–80.0)
RDW: 19.6 % — ABNORMAL HIGH (ref 11.1–15.7)
WBC: 5.7 10*3/uL (ref 3.9–10.0)

## 2012-12-05 LAB — COMPREHENSIVE METABOLIC PANEL
Alkaline Phosphatase: 50 U/L (ref 39–117)
CO2: 31 mEq/L (ref 19–32)
Creatinine, Ser: 1.26 mg/dL — ABNORMAL HIGH (ref 0.50–1.10)
Glucose, Bld: 110 mg/dL — ABNORMAL HIGH (ref 70–99)
Total Bilirubin: 0.5 mg/dL (ref 0.3–1.2)

## 2012-12-05 NOTE — Progress Notes (Signed)
This office note has been dictated.

## 2012-12-05 NOTE — Progress Notes (Signed)
No treatment today per dr. Niel Hummer

## 2012-12-06 ENCOUNTER — Ambulatory Visit: Payer: Medicare Other

## 2012-12-06 NOTE — Progress Notes (Signed)
CC:   Amber Lento, PA-C Amber Bautista. Amber Bautista, M.D.  DIAGNOSIS: 1. 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 2. Metastatic carcinoid.  CURRENT THERAPY: 1. Patient has completed her adjuvant chemotherapy with 4 cycles of     carboplatin/Taxotere. 2. Sandostatin q. month, currently on hold.  INTERIM HISTORY:  Amber Bautista comes in for followup.  She has had a tough time with the last couple of cycle of chemotherapy.  She just has felt very tired and weak.  She is grateful that she is finally done with her treatments.  She has had 4 cycles.  We gave them to her on schedule.  She is now ready for radiation therapy.  She will need radiation therapy since she had a lumpectomy.  She did have the positive lymph node.  She has had no problems with cough.  There is no leg swelling.  She has had no rashes.  She has had no issues with diarrhea.  We have held the Sandostatin.  I think we can probably continue to hold that for now.  Her last chromogranin A was done back in December which was 4.6.  Her last echocardiogram which was done in November was 30%.  We went ahead and repeated a MUGA scan on her in December.  The MUGA scan showed 51%.  We will need to follow up with this.  PHYSICAL EXAMINATION:  General:  This is an obese Hispanic female in no obvious distress.  Vital Signs:  Temperature 98.1, pulse 108, respiratory rate 18, blood pressure 129/81, weight is 293.  Head and Neck:  Normocephalic, atraumatic skull.  There are no ocular or oral lesions.  There are no palpable cervical or supraclavicular lymph nodes. Lungs:  Clear bilaterally.  Cardiac:  Regular rate and rhythm with a normal S1, S2.  She has no murmurs, rubs or bruits.  Abdomen:  Soft with good bowel sounds.  There is no palpable abdominal mass.  There is no palpable hepatosplenomegaly.  Back:  No tenderness over the spine, ribs, or hips.  Extremities:  Show no clubbing, cyanosis or  edema.  Skin:  No rashes.  Neurological:  No focal neurological deficits.  LABORATORY STUDIES:  White cell count 5.7, hemoglobin 7.7, hematocrit 24.4, platelet count 112.  IMPRESSION:  Amber Bautista is a very nice 42 year old Hispanic female with stage IIB infiltrating ductal carcinoma of the left breast.  Again, her tumor is triple negative.  She completed her systemic chemotherapy.  She actually did pretty well with this.  Again, we will need to see about radiation therapy for her.  I will call [facility] radiation oncologist.  We will plan to get her back to see [place] in another 6 weeks or so.  By then, she should be getting close to finishing her radiation therapy.  At some point, we need to get her back on to the Sandostatin for the carcinoid.    ______________________________ Volanda Napoleon, M.D. PRE/MEDQ  D:  12/05/2012  T:  12/06/2012  Job:  [id number]

## 2012-12-10 ENCOUNTER — Other Ambulatory Visit: Payer: Self-pay | Admitting: *Deleted

## 2012-12-10 ENCOUNTER — Encounter: Payer: Self-pay | Admitting: *Deleted

## 2012-12-10 MED ORDER — POTASSIUM CHLORIDE CRYS ER 20 MEQ PO TBCR: EXTENDED_RELEASE_TABLET | ORAL | Status: DC

## 2012-12-10 NOTE — Telephone Encounter (Deleted)
Patient called and sch 12/17/12 lab apt per Olivia Mackie

## 2012-12-10 NOTE — Telephone Encounter (Signed)
Patient called and sch lab apt for 12/17/12

## 2012-12-11 ENCOUNTER — Encounter: Payer: Self-pay | Admitting: *Deleted

## 2012-12-11 ENCOUNTER — Telehealth: Payer: Self-pay | Admitting: *Deleted

## 2012-12-11 NOTE — Telephone Encounter (Signed)
Message copied by Rico Ala on Wed Dec 11, 2012  1:07 PM ------      Message from: Volanda Napoleon      Created: Sat Dec 07, 2012  8:41 AM       Please call. Her potassium is very low. She needs to be on 40 mEq a day. She needs her potassium rechecked in one week. Thanks. Pete ------

## 2012-12-17 ENCOUNTER — Other Ambulatory Visit: Payer: Medicare Other | Admitting: Lab

## 2012-12-17 ENCOUNTER — Other Ambulatory Visit: Payer: Self-pay | Admitting: Medical

## 2012-12-17 DIAGNOSIS — C50419 Malignant neoplasm of upper-outer quadrant of unspecified female breast: Secondary | ICD-10-CM

## 2012-12-18 ENCOUNTER — Telehealth: Payer: Self-pay | Admitting: Hematology & Oncology

## 2012-12-18 NOTE — Telephone Encounter (Signed)
Fax path report to [facility]

## 2012-12-20 ENCOUNTER — Telehealth: Payer: Self-pay | Admitting: *Deleted

## 2012-12-20 NOTE — Telephone Encounter (Signed)
Message copied by Orlando Penner on Fri Dec 20, 2012  3:57 PM ------      Message from: Volanda Napoleon      Created: Sat Dec 07, 2012  8:41 AM       Please call. Her potassium is very low. She needs to be on 40 mEq a day. She needs her potassium rechecked in one week. Thanks. Pete ------

## 2012-12-26 ENCOUNTER — Telehealth: Payer: Self-pay | Admitting: Oncology

## 2012-12-26 NOTE — Telephone Encounter (Addendum)
Message copied by Cottie Banda on Thu Dec 26, 2012  4:25 PM ------      Message from: Volanda Napoleon      Created: Sat Dec 07, 2012  8:41 AM       Please call. Her potassium is very low. She needs to be on 40 mEq a day. She needs her potassium rechecked in one week. Thanks. Pete 4:31 PM Spoke with patient, she missed her last lab appointment due to the weather and car trouble. Instructed to make lab appointment for next week. To see MD 01/17/13. States is taking potassium twice daily.

## 2013-01-17 ENCOUNTER — Ambulatory Visit (HOSPITAL_BASED_OUTPATIENT_CLINIC_OR_DEPARTMENT_OTHER): Payer: Medicare Other

## 2013-01-17 ENCOUNTER — Other Ambulatory Visit (HOSPITAL_BASED_OUTPATIENT_CLINIC_OR_DEPARTMENT_OTHER): Payer: Medicare Other | Admitting: Lab

## 2013-01-17 ENCOUNTER — Ambulatory Visit (HOSPITAL_BASED_OUTPATIENT_CLINIC_OR_DEPARTMENT_OTHER): Payer: Medicare Other | Admitting: Medical

## 2013-01-17 VITALS — BP 152/82 | HR 91 | Temp 98.2°F | Resp 18 | Ht 68.0 in | Wt 285.0 lb

## 2013-01-17 DIAGNOSIS — D3A098 Benign carcinoid tumors of other sites: Secondary | ICD-10-CM

## 2013-01-17 DIAGNOSIS — Z171 Estrogen receptor negative status [ER-]: Secondary | ICD-10-CM

## 2013-01-17 DIAGNOSIS — C50412 Malignant neoplasm of upper-outer quadrant of left female breast: Secondary | ICD-10-CM

## 2013-01-17 DIAGNOSIS — C50419 Malignant neoplasm of upper-outer quadrant of unspecified female breast: Secondary | ICD-10-CM

## 2013-01-17 DIAGNOSIS — C7A00 Malignant carcinoid tumor of unspecified site: Secondary | ICD-10-CM

## 2013-01-17 DIAGNOSIS — E876 Hypokalemia: Secondary | ICD-10-CM

## 2013-01-17 DIAGNOSIS — C50411 Malignant neoplasm of upper-outer quadrant of right female breast: Secondary | ICD-10-CM

## 2013-01-17 LAB — CBC WITH DIFFERENTIAL (CANCER CENTER ONLY)
EOS%: 5.8 % (ref 0.0–7.0)
Eosinophils Absolute: 0.4 10*3/uL (ref 0.0–0.5)
LYMPH%: 28.9 % (ref 14.0–48.0)
MCH: 31 pg (ref 26.0–34.0)
MCHC: 31 g/dL — ABNORMAL LOW (ref 32.0–36.0)
MCV: 100 fL (ref 81–101)
MONO%: 8.2 % (ref 0.0–13.0)
NEUT#: 4 10*3/uL (ref 1.5–6.5)
Platelets: 358 10*3/uL (ref 145–400)
RBC: 3.19 10*6/uL — ABNORMAL LOW (ref 3.70–5.32)

## 2013-01-17 MED ORDER — OCTREOTIDE ACETATE 30 MG IM KIT
30.0000 mg | PACK | Freq: Once | INTRAMUSCULAR | Status: AC
  Administered 2013-01-17: 30 mg via INTRAMUSCULAR

## 2013-01-17 NOTE — Patient Instructions (Signed)

## 2013-01-17 NOTE — Progress Notes (Signed)
DIAGNOSIS: 1. 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 2. Metastatic carcinoid.  CURRENT THERAPY: 1. Patient has completed her adjuvant chemotherapy with 4 cycles of     carboplatin/Taxotere. 2. Sandostatin LAR 30mg  IM monthly currently on hold.  Current Therapy: #1.  Patient to start radiation therapy to the left breast.  INTERIM HISTORY: Amber Bautista presents today for an office followup visit.  Unfortunately, she, reports, that she's not received radiation to the, left breast yet.  She states, that she had a consultation with the radiation oncologist at [facility], and he wanted her to drive to do for imaging in situ prior to her receiving radiation therapy.  She states, that the date she could be seen at [facility], were not compatible with when her brother could take her.  She states, she informed the radiation oncologist of this last week, and she's not heard back since then.  We really need to get her set up for radiation as soon as possible.  She is agreeable to seeing, our radiation oncologist at [facility].  We will go ahead and get her set up.  Over the her. She otherwise does not report any new problems.  She does continue to state, that her fingertips are sensitive, and she continues to have some nail issues, but otherwise is doing quite well.  She recently moved.  She states she has been quite active.  She is getting her energy.  Back.  She has a good appetite.  She denies any nausea, vomiting, diarrhea, constipation, chest pain, shortness breath, or cough.  She denies any fevers, chills, or night sweats.  She denies any type of abdominal pain, any obvious, bleeding or bruising.  She denies any headaches, visual changes, or rashes.  She denies any type of neuropathy.  Of note, her last MUGA scan, was in December and her ejection fraction was 51%.  We will go ahead and repeat another MUGA scan.  She does not report any cardiac  symptoms.  Review of Systems: Constitutional:Negative for malaise/fatigue, fever, chills, weight loss, diaphoresis, activity change, appetite change, and unexpected weight change.  HEENT: Negative for double vision, blurred vision, visual loss, ear pain, tinnitus, congestion, rhinorrhea, epistaxis sore throat or sinus disease, oral pain/lesion, tongue soreness Respiratory: Negative for cough, chest tightness, shortness of breath, wheezing and stridor.  Cardiovascular: Negative for chest pain, palpitations, leg swelling, orthopnea, PND, DOE or claudication Gastrointestinal: Negative for nausea, vomiting, abdominal pain, diarrhea, constipation, blood in stool, melena, hematochezia, abdominal distention, anal bleeding, rectal pain, anorexia and hematemesis.  Genitourinary: Negative for dysuria, frequency, hematuria,  Musculoskeletal: Negative for myalgias, back pain, joint swelling, arthralgias and gait problem.  Skin: Negative for rash, color change, pallor and wound.  Neurological:. Negative for dizziness/light-headedness, tremors, seizures, syncope, facial asymmetry, speech difficulty, weakness, numbness, headaches and paresthesias.  Hematological: Negative for adenopathy. Does not bruise/bleed easily.  Psychiatric/Behavioral:  Negative for depression, no loss of interest in normal activity or change in sleep pattern.   Physical Exam: This is an obese, 42 year old, Hispanic female, in no obvious distress Vitals: Temperature 98.2 degrees, pulse 91, respirations 18, blood pressure 152/82.  Weight 285 pounds HEENT reveals a normocephalic, atraumatic skull, no scleral icterus, no oral lesions  Neck is supple without any cervical or supraclavicular adenopathy.  Lungs are clear to auscultation bilaterally. There are no wheezes, rales or rhonci Cardiac is regular rate and rhythm with a normal S1 and S2. There  are no murmurs, rubs, or bruits.  Abdomen is soft with good bowel sounds, there is no palpable  mass. There is no palpable hepatosplenomegaly. There is no palpable fluid wave.  Musculoskeletal no tenderness of the spine, ribs, or hips.  Extremities there are no clubbing, cyanosis, or edema.  Skin no petechia, purpura or ecchymosis Neurologic is nonfocal.  Laboratory Data: White count 7.1, hemoglobin 9.9, hematocrit 31.9, platelets 358,000  Current Outpatient Prescriptions on File Prior to Visit  Medication Sig Dispense Refill  . ciprofloxacin (CIPRO) 500 MG tablet Take 1 tablet (500 mg total) by mouth 2 (two) times daily.  42 tablet  1  . gabapentin (NEURONTIN) 300 MG capsule Take 1 capsule (300 mg total) by mouth 3 (three) times daily.  90 capsule  1  . LORazepam (ATIVAN) 1 MG tablet Place 1 tablet (1 mg total) under the tongue every 6 (six) hours as needed for anxiety. Or nausea and vomiting  30 tablet  3  . ondansetron (ZOFRAN) 8 MG tablet Take 1 tablet (8 mg total) by mouth 2 (two) times daily. Take one tablet in am and one tablet in pm beginning the day after chemotherapy x 3 days  20 tablet  3  . potassium chloride SA (K-DUR,KLOR-CON) 20 MEQ tablet Take 1 tablet by mouth every day  30 tablet  3  . prochlorperazine (COMPAZINE) 10 MG tablet Take 1 tablet (10 mg total) by mouth every 6 (six) hours as needed.  30 tablet  0  . tobramycin-dexamethasone (TOBRADEX) ophthalmic solution Place 1 drop into both eyes every 4 (four) hours while awake.  5 mL  0  . triamterene-hydrochlorothiazide (DYAZIDE) 37.5-25 MG per capsule Take 1 capsule by mouth every morning.        No current facility-administered medications on file prior to visit.   Assessment/Plan: This is a pleasant, 42 year old, Hispanic female, with the following issues:  #1.  Stage IIb infiltrating ductal carcinoma of the left breast.  Again, her tumor is triple negative.  She did complete systemic chemotherapy.  I do not want to delay her radiation any further.  I will go ahead and make a call to Dr. Isidore Moos to see how soon we can  fit her in for consultation and simulation.  I really appreciate their help.  #2.  Hypokalemia.  She remains on potassium.  #3.  Followup.  We will follow back up with Amber Bautista in about 6 weeks, but before then should there be questions or concerns.

## 2013-01-20 ENCOUNTER — Telehealth: Payer: Self-pay | Admitting: Hematology & Oncology

## 2013-01-20 ENCOUNTER — Telehealth: Payer: Self-pay | Admitting: *Deleted

## 2013-01-20 DIAGNOSIS — E876 Hypokalemia: Secondary | ICD-10-CM

## 2013-01-20 DIAGNOSIS — C50412 Malignant neoplasm of upper-outer quadrant of left female breast: Secondary | ICD-10-CM

## 2013-01-20 NOTE — Telephone Encounter (Signed)
Left pt message scheduled lab at [facility] 4-18 at 1130 am after her other appointments on the same day

## 2013-01-20 NOTE — Telephone Encounter (Addendum)
Message copied by Jodelle Green on Mon Jan 20, 2013 11:40 AM ------      Message from: Burney Gauze R      Created: Sun Jan 19, 2013  7:44 PM       Call - K+ is low!! Needs K-Dur 28mEq po qd.  Need to check B_Met in 1 week.  Pete   Spoke to the pt. She has been taking 20 meg daily. Instructed her to increase her dose to 2 pills or 40 meq daily for 1 week then come in for a lab recheck with verbalized understanding. She asked to have Rick call her later to schedule the appt.

## 2013-01-21 LAB — COMPREHENSIVE METABOLIC PANEL
AST: 17 U/L (ref 0–37)
Alkaline Phosphatase: 53 U/L (ref 39–117)
BUN: 14 mg/dL (ref 6–23)
Glucose, Bld: 104 mg/dL — ABNORMAL HIGH (ref 70–99)
Potassium: 2.8 mEq/L — ABNORMAL LOW (ref 3.5–5.3)
Sodium: 140 mEq/L (ref 135–145)
Total Bilirubin: 0.5 mg/dL (ref 0.3–1.2)
Total Protein: 6.9 g/dL (ref 6.0–8.3)

## 2013-01-22 ENCOUNTER — Encounter: Payer: Self-pay | Admitting: Radiation Oncology

## 2013-01-22 ENCOUNTER — Telehealth: Payer: Self-pay | Admitting: Hematology & Oncology

## 2013-01-22 ENCOUNTER — Encounter: Payer: Self-pay | Admitting: *Deleted

## 2013-01-22 NOTE — Progress Notes (Signed)
Location of Breast Cancer: left upper Outer Quadrant  Histology per Pathology Report: Stage IIB ( T2, N1, Mo ) Ductal Carcinoma of the left breast  Receptor Status: ER(-), PR (-), Her2-neu (-)  Did patient present with symptoms (if so, please note symptoms) or was this found on screening mammography?: admitted to [facility] because of abdominal pain and imaging studies revealed a 1.7 cm mass in the upper-outer quadrant of the left breast.  Past/Anticipated interventions by surgeon, if any:  PARTIAL MASTECTOMY WITH NEEDLE LOCALIZATION AND AXILLARY SENTINEL LYMPH NODE [place];  Surgeon: Adin Hector, MD;  Location: [place] [place];  Service: General;  Laterality: Left;  Past/Anticipated interventions by medical oncology, if any: 4 cycles of Carboplatin and Taxol  Lymphedema issues, if any:  No  Pain issues, if any:  no  SAFETY ISSUES:  Prior radiation? No  Pacemaker/ICD? No  Possible current pregnancy? No  Is the patient on methotrexate? 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  Resection performed.  Liver Metastasis in 2007.  Morbidly Obese.  Diarrhea controlled with Sandostatin

## 2013-01-22 NOTE — Telephone Encounter (Signed)
Received e-mail from [facility] not approved by [facility]. Per Dr. Marin Olp scan has been canceled and Pt is aware

## 2013-01-23 ENCOUNTER — Encounter: Payer: Self-pay | Admitting: Radiation Oncology

## 2013-01-23 DIAGNOSIS — C50919 Malignant neoplasm of unspecified site of unspecified female breast: Secondary | ICD-10-CM | POA: Insufficient documentation

## 2013-01-24 ENCOUNTER — Other Ambulatory Visit (HOSPITAL_BASED_OUTPATIENT_CLINIC_OR_DEPARTMENT_OTHER): Payer: Medicare Other | Admitting: Lab

## 2013-01-24 ENCOUNTER — Ambulatory Visit
Admission: RE | Admit: 2013-01-24 | Discharge: 2013-01-24 | Disposition: A | Discharge status: Home / Self Care | Payer: Medicare Other | Source: Ambulatory Visit | Attending: Radiation Oncology | Admitting: Radiation Oncology

## 2013-01-24 ENCOUNTER — Encounter: Payer: Self-pay | Admitting: Radiation Oncology

## 2013-01-24 VITALS — BP 139/94 | HR 77 | Temp 98.8°F | Resp 20 | Wt 288.3 lb

## 2013-01-24 DIAGNOSIS — Z9221 Personal history of antineoplastic chemotherapy: Secondary | ICD-10-CM | POA: Insufficient documentation

## 2013-01-24 DIAGNOSIS — C50919 Malignant neoplasm of unspecified site of unspecified female breast: Secondary | ICD-10-CM | POA: Insufficient documentation

## 2013-01-24 DIAGNOSIS — C50419 Malignant neoplasm of upper-outer quadrant of unspecified female breast: Secondary | ICD-10-CM | POA: Insufficient documentation

## 2013-01-24 DIAGNOSIS — Z510 Encounter for antineoplastic radiation therapy: Secondary | ICD-10-CM | POA: Insufficient documentation

## 2013-01-24 DIAGNOSIS — C50412 Malignant neoplasm of upper-outer quadrant of left female breast: Secondary | ICD-10-CM

## 2013-01-24 DIAGNOSIS — Z171 Estrogen receptor negative status [ER-]: Secondary | ICD-10-CM | POA: Insufficient documentation

## 2013-01-24 DIAGNOSIS — E876 Hypokalemia: Secondary | ICD-10-CM

## 2013-01-24 DIAGNOSIS — L539 Erythematous condition, unspecified: Secondary | ICD-10-CM | POA: Insufficient documentation

## 2013-01-24 NOTE — Progress Notes (Signed)
Pt alone today, has 2 daughters, 44, 5, 1 grandchild. Her 43 yr old lives w/her. She is a stay at home mother. She denies pain, fatigue, loss of appetite.

## 2013-01-24 NOTE — Progress Notes (Signed)
Please see the Nurse Progress Note in the MD Initial Consult Encounter for this patient. 

## 2013-01-24 NOTE — Progress Notes (Signed)
Per Dr Isidore Moos, pt signed authorization for release of information to [facility], radiation oncology. Signed release paper given to Marthann Schiller to fax; information to be given to Dr Isidore Moos.

## 2013-01-25 ENCOUNTER — Encounter: Payer: Self-pay | Admitting: Radiation Oncology

## 2013-01-25 NOTE — Progress Notes (Signed)
  Radiation Oncology         [phone] ________________________________  Name: Amber Bautista MRN: [id number]  Date: 01/24/2013  DOB: 03-13-1971  SIMULATION AND TREATMENT PLANNING NOTE  outpatient  DIAGNOSIS:  Stage II left breast cancer  NARRATIVE:  The patient was brought to the [facility].  Identity was confirmed.  All relevant records and images related to the planned course of therapy were reviewed.  The patient freely provided informed written consent to proceed with treatment after reviewing the details related to the planned course of therapy. The consent form was witnessed and verified by the simulation staff.    Then, the patient was set-up in a stable reproducible  supine position for radiation therapy, head in accuform, arms overhead.. Adhesive wiring placed over scar, and around borders of left breast tissue. Guaze roll place at lateral breast fold, and styrofoam wedge at inframammary fold.  Prone positioning not feasible due to comorbidities.  CT images were obtained.  Surface markings were placed.  The CT images were loaded into the planning software.   Breathhold technique felt to be technically unrealistic given body habitus/patient set up.  I can block the heart reasonably well with MLCs.   TREATMENT PLANNING NOTE: Treatment planning then occurred.  The radiation prescription was entered and confirmed.    A total of 3 medically necessary complex treatment devices were fabricated and supervised by me - accuform, 2 fields with MLCs (opposed tangents). I have requested : 3D Simulation  I have requested a DVH of the following structures: heart, lungs, lumpectomy cavity.   I recommend adjuvant RT to the left breast - it is quite large and pendulous.  I think the best fractionation given her anatomy will be 45 Gy /25 fractions to the whole breast - treating with high tangents to cover more axillary tissue in light of the N14mic disease.  I would then give a  boost  to the lumpectomy cavity of 16 Gy in 8 fractions.  -----------------------------------  Eppie Gibson, MD

## 2013-01-25 NOTE — Progress Notes (Signed)
Radiation Oncology         [phone] ________________________________  Initial outpatient Consultation  Name: Amber Bautista MRN: [id number]  Date: 01/24/2013  DOB: 09-09-1971  [id number], PA-C  Ennever, Rudell Cobb, MD Fanny Skates MD  REFERRING PHYSICIAN: Volanda Napoleon, MD  DIAGNOSIS: The encounter diagnosis was Cancer of upper-outer quadrant of female breast, left. Stage II, pT2, pN20mic. Triple negative.  HISTORY OF PRESENT ILLNESS::Amber Bautista is a 42 y.o. female who was diagnoses last year, incidentally , with Left breast cancer.  She had presented with abdominal pain, and imaging showed a 1.7cm mass in her left breast. Imaging studies and image guided biopsy of this upper-outer quadrant left breast mass was positive for invasive ductal carcinoma, triple negative breast cancer. Breasts MRI - positive for a solitary 2 cm mass in the upper outer quadrant left breast. Otherwise negative.   On 08/23/12 she underwent lumpectomy and SLN bx - path showed  Diagnosis 1. Breast, lumpectomy, Left - INVASIVE DUCTAL CARCINOMA, GRADE II / III, SPANNING 2.1 CM. - DUCTAL CARCINOMA IN SITU, LOW GRADE. - LYMPHOVASCULAR INVASION IS IDENTIFIED. - THE SURGICAL RESECTION MARGINS ARE NEGATIVE FOR CARCINOMA. - SEE ONCOLOGY TABLE BELOW. 2. Lymph node, sentinel, biopsy, Left axillary #1 - MICROMETASTATIC CARCINOMA IN 1 OF 1 LYMPH NODE (1 MIC/1). 3. Lymph node, sentinel, biopsy, Left axillary #2 - THERE IS NO EVIDENCE OF CARCINOMA IN 1 OF 1 LYMPH NODE (0/1). 4. Lymph node, sentinel, biopsy, Left axillary #3 - THERE IS NO EVIDENCE OF CARCINOMA IN 1 OF 1 LYMPH NODE (0/1). Microscopic Comment 1. BREAST, INVASIVE TUMOR, WITH LYMPH NODE SAMPLING Specimen, including laterality: Left breast. Procedure: Lumpectomy. Grade: II. Tubule formation: 3. Nuclear pleomorphism: 2. Mitotic: 1. Tumor size (gross measurement): 2.1 cm. Margins: Negative for carcinoma. Invasive, distance to closest  margin: 1.1 cm to the inferior margin (gross measurement). In-situ, distance to closest margin: 1.1 cm to the inferior margin (gross measurement). Lymphovascular invasion: Present. Ductal carcinoma in situ: Present. Grade: Low grade. Extensive intraductal component: Not identified. Lobular neoplasia: Not identified. Tumor focality: Unifocal. Treatment effect: N/A. Extent of tumor: Confined to breast parenchyma. Lymph nodes: # examined: 3. Lymph nodes with metastasis: 1. Isolated tumor cells (< 0.2 mm): 0. Micrometastasis: (> 0.2 mm and < 2.0 mm): 1. Macrometastasis: (> 2.0 mm): 0. Extracapsular extension: Not identified. Breast prognostic profile: Case [id number]: Estrogen receptor: Negative. Progesterone receptor: Negative. HER-2/neu: No amplification was detected. The ratio was 1.25. Ki-67: 7%. Non-neoplastic breast: Fibrocystic changes. TNM: pT2, pN17mic.   She finished 4 cycles of adjuvant chemotherapy with 4 cycles of  Carboplatin/Taxotere. This completed in February.  She was going to receive RT in [facility] thereafter.  For logistical and personal reasons, the patient explained that she did not start treatment there. She is interested in receiving RT in [place].  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 Liver metastasis were discovered in 2007 - unresected.  She is followed by Dr. Burney Gauze for this and her breast cancer. Per notes, Sandostatin is on hold currently.    PREVIOUS RADIATION THERAPY: No  PAST MEDICAL HISTORY:  has a past medical history of Carcinoid tumor of intestine (2001); Hypertension; Breast cancer (07/12/12); Carcinoma of breast treated with adjuvant chemotherapy; Diarrhea; and Morbidly obese.    PAST SURGICAL HISTORY: Past Surgical History  Procedure Laterality Date  . Cesarean section  1988 2007  . Ovarian cyst removal  2001  . Colon surgery  2001  Cancer/ Intestinal Resection  .  Partial mastectomy with needle localization and axillary sentinel lymph node bx  08/23/2012    Procedure: PARTIAL MASTECTOMY WITH NEEDLE LOCALIZATION AND AXILLARY SENTINEL LYMPH NODE BX;  Surgeon: Adin Hector, MD;  Location: [facility];  Service: General;  Laterality: Left;  blue dye injection   . Portacath placement  08/23/2012    Procedure: INSERTION PORT-A-CATH;  Surgeon: Adin Hector, MD;  Location: [place];  Service: General;  Laterality: Right;  intraop ultrasound    FAMILY HISTORY: family history includes Asthma in her brother; Cancer in her paternal grandmother; Diabetes in her father and mother; Glaucoma in her mother; Heart attack in her father; and Hypertension in her brother and mother.  SOCIAL HISTORY:  reports that she has never smoked. She has never used smokeless tobacco. She reports that she does not drink alcohol or use illicit drugs.  ALLERGIES: Review of patient's allergies indicates no known allergies.  MEDICATIONS:  Current Outpatient Prescriptions  Medication Sig Dispense Refill  . potassium chloride SA (K-DUR,KLOR-CON) 20 MEQ tablet Take 1 tablet by mouth every day  30 tablet  3  . triamterene-hydrochlorothiazide (DYAZIDE) 37.5-25 MG per capsule Take 1 capsule by mouth every morning.        No current facility-administered medications for this encounter.    REVIEW OF SYSTEMS: Pertinent items are noted in HPI.   PHYSICAL EXAM:  weight is 288 lb 4.8 oz (130.772 kg). Her oral temperature is 98.8 F (37.1 C). Her blood pressure is 139/94 and her pulse is 77. Her respiration is 20.  General: Alert and oriented, in no acute distress HEENT: Head is normocephalic. Pupils are equally round and reactive to light. Extraocular movements are intact. Oropharynx is clear. Neck: Neck is supple, no palpable cervical or supraclavicular lymphadenopathy. Heart: Distant sounds. Regular in rate and rhythm with no murmurs, rubs, or gallops. Chest: Distant sounds. Clear to auscultation  bilaterally, with no rhonchi, wheezes, or rales. Abdomen: Soft, nontender, nondistended, with no rigidity or guarding. Extremities: No cyanosis or edema. Lymphatics: No concerning lymphadenopathy. Skin: No concerning lesions. Musculoskeletal: symmetric strength and muscle tone throughout. Neurologic: Cranial nerves II through XII are grossly intact. No obvious focalities. Speech is fluent. Coordination is intact. Psychiatric: Judgment and insight are intact. Affect is appropriate. Breasts: Large Breasts.  Left breast - lateralized lumpectomy cavity - possible seroma. Left breast modestly swollen, and erythematous.  Follicular indentations through breast - but I do not think this is likely peau d'orange in the inflammatory breast cancer sense. She says these changes have been noticeable for months. She has similar follicular changes in the right breast without the erythema.  No concerning lesions or axillary lymphadenopathy bilaterally.    LABORATORY DATA:  Lab Results  Component Value Date   WBC 7.1 01/17/2013   HGB 9.9* 01/17/2013   HCT 31.9* 01/17/2013   MCV 100 01/17/2013   PLT 358 01/17/2013   CMP     Component Value Date/Time   NA 140 01/17/2013 1040   K 2.8* 01/17/2013 1040   CL 94* 01/17/2013 1040   CO2 34* 01/17/2013 1040   GLUCOSE 104* 01/17/2013 1040   BUN 14 01/17/2013 1040   CREATININE 1.31* 01/17/2013 1040   CALCIUM 9.5 01/17/2013 1040   PROT 6.9 01/17/2013 1040   ALBUMIN 3.8 01/17/2013 1040   AST 17 01/17/2013 1040   ALT 15 01/17/2013 1040   ALKPHOS 53 01/17/2013 1040   BILITOT 0.5 01/17/2013 1040   GFRNONAA >90 08/15/2012 [id number]  GFRAA >90 08/15/2012 0938         RADIOGRAPHY: as above  IMPRESSION/PLAN: Lovely 42 yo woman with left breast cancer, T2N15mic, triple negative.  Will refer back to Dr. Dalbert Batman for his opinion as to whether a punch biopsy of the skin is warranted in light of her p'eau d'orange appearance. But, I think this is unlikely to be truly an inflammatory  relapse of cancer.  I value his opinion as he is more familiar with the patient's baseline.   I recommend adjuvant RT to the left breast - it is quite large and pendulous.  I think the best fractionation given her anatomy will be 45 Gy /25 fractions to the whole breast - treating with high tangents to cover more axillary tissue in light of the N62mic disease.  I would then give a  boost to the lumpectomy cavity of 16 Gy in 8 fractions.  Simulation to occur today.    It was a pleasure meeting the patient today. We discussed the risks, benefits, and side effects of radiotherapy. We discussed that radiation would take approximately 7 weeks to complete. We spoke about acute effects including skin irritation and fatigue as well as much less common late effects including lung and heart irritation. We spoke about the latest technology that is used to minimize the risk of late effects for breast cancer patients undergoing radiotherapy. No guarantees of treatment were given. The patient is enthusiastic about proceeding with treatment. I look forward to participating in the patient's care.  I spent 50 minutes minutes face to face with the patient and more than 50% of that time was spent in counseling and/or coordination of care.  __________________________________________   Eppie Gibson, MD

## 2013-01-27 ENCOUNTER — Telehealth (INDEPENDENT_AMBULATORY_CARE_PROVIDER_SITE_OTHER): Payer: Self-pay

## 2013-01-27 NOTE — Telephone Encounter (Signed)
I tried to reach the pt again and she didn't answer.  I left a message for her to call.

## 2013-01-27 NOTE — Telephone Encounter (Signed)
I called and left the pt a message to call me.  I want to bring her in on Friday the 25th at 11am.  I await her call back.

## 2013-01-30 ENCOUNTER — Encounter (HOSPITAL_COMMUNITY): Payer: Medicare Other

## 2013-01-31 ENCOUNTER — Encounter (INDEPENDENT_AMBULATORY_CARE_PROVIDER_SITE_OTHER): Payer: Medicare Other | Admitting: General Surgery

## 2013-02-03 ENCOUNTER — Encounter (INDEPENDENT_AMBULATORY_CARE_PROVIDER_SITE_OTHER): Payer: Self-pay | Admitting: General Surgery

## 2013-02-03 ENCOUNTER — Ambulatory Visit (INDEPENDENT_AMBULATORY_CARE_PROVIDER_SITE_OTHER): Payer: Medicare Other | Admitting: General Surgery

## 2013-02-03 ENCOUNTER — Telehealth (INDEPENDENT_AMBULATORY_CARE_PROVIDER_SITE_OTHER): Payer: Self-pay | Admitting: General Surgery

## 2013-02-03 ENCOUNTER — Ambulatory Visit
Admission: RE | Admit: 2013-02-03 | Discharge: 2013-02-03 | Disposition: A | Discharge status: Home / Self Care | Payer: Medicare Other | Source: Ambulatory Visit | Attending: Radiation Oncology | Admitting: Radiation Oncology

## 2013-02-03 ENCOUNTER — Encounter: Payer: Self-pay | Admitting: Radiation Oncology

## 2013-02-03 VITALS — BP 140/92 | HR 98 | Temp 96.0°F | Ht 68.0 in | Wt 287.4 lb

## 2013-02-03 DIAGNOSIS — C50412 Malignant neoplasm of upper-outer quadrant of left female breast: Secondary | ICD-10-CM

## 2013-02-03 DIAGNOSIS — C50419 Malignant neoplasm of upper-outer quadrant of unspecified female breast: Secondary | ICD-10-CM

## 2013-02-03 MED ORDER — ALRA NON-METALLIC DEODORANT (RAD-ONC)
1.0000 "application " | Freq: Once | TOPICAL | Status: AC
  Administered 2013-02-03: 1 via TOPICAL

## 2013-02-03 MED ORDER — RADIAPLEXRX EX GEL
Freq: Once | CUTANEOUS | Status: AC
  Administered 2013-02-03: 20:00:00 via TOPICAL

## 2013-02-03 NOTE — Telephone Encounter (Signed)
I called the pt at [facility] and I  have already scheduled her to come in today.

## 2013-02-03 NOTE — Addendum Note (Signed)
Encounter addended by: Deirdre Evener, RN on: 02/03/2013  7:44 PM<BR>     Documentation filed: Orders

## 2013-02-03 NOTE — Telephone Encounter (Signed)
Patient called in stating she has some redness at her breast and her radiation MD wants her to see Dr Dalbert Batman before she can start radiation. I saw that Clarise Hartland tried to contact her last week with an appt. I verified that patient can be reached at [phone]. She will have her phone with her today. Message being forwarded to Clarise Doorn to get patient an appt asap.

## 2013-02-03 NOTE — Progress Notes (Signed)
Amber Bautista had her port check today and will start treatment on tomorrow.  She has no voiced concerns.  Education today regarding management of pain, fatigue and skin with instructions to apply Radiaplex Gel at least BID and only after treatment and given Alra Deodorant..  Given the Radiation Therapy and You booklet and this RN's business card.  Informed that Dr. Isidore Moos sees her patients every Monday after treatment, but if she is not in clinic she will inform her in advance.

## 2013-02-03 NOTE — Progress Notes (Signed)
Education documentation under the treatment encounter.

## 2013-02-03 NOTE — Progress Notes (Signed)
Patient ID: Amber Bautista, female   DOB: August 04, 1971, 42 y.o.   MRN: [id number] History: This patient returns at the request of Dr. Eppie Gibson for evaluation of the skin of the left breast, just prior to starting her radiation therapy tomorrow. On 08/23/2012 she underwent Port-A-Cath insertion, left partial mastectomy and sentinel node biopsy. She had a 2.1 cm triple negative breast cancer, one out of 3 lymph nodes showed a micrometastasis for final stage T2, N75mic. She failed followup in December with me, stating that she was sick from her chemotherapy. She has received [place] and Taxotere under the guidance of Dr. Burney Gauze.    Radiation therapy to the left breast is starting tomorrow. Dr. Isidore Moos thought the breast looked a little funny and wanted me to check the skin. The patient states that the brest  has been a little bit pink for about 3 months. There is no pain.  Exam: Patient looks well. No distress. Morbidly  obese. Left breast is large. Well-healed transverse lumpectomy incision laterally. Well-healed axillary incision. Lumpectomy site shows a little bit of scarring and thickening but no infection. The entire breast has a slight edema. No signs of cellulitis or infection. The skin is very soft, not thickened. Just prominent pores suggesting mild edema of skin and subcutaneous tissue. No thickening or mass to suggest skin cancer.  Assessment: Mild diffuse skin edema, left breast. This may be a mild lymphatic obstruction. No evidence of a cellulitis. No evidence of breast cancer. Skin biopsy is not indicated. Stage T2, N54mic triple negative breast cancer left breast, uneventful healing 6 months postoperative insertion and breast conservation surgery  Plan: The patient was reassured Proceed with radiation therapy Return to see me in 4 months.   Edsel Petrin. Dalbert Batman, M.D., [facility] General and Minimally invasive Surgery Breast and Colorectal Surgery Office:    [phone] Pager:   [phone]

## 2013-02-03 NOTE — Patient Instructions (Addendum)
Your breast exam today does not show any evidence of cancer, and I do not think that a skin biopsy is indicated.  You may proceed with your radiation therapy.  Return to Dr. Dalbert Batman in 4 months.

## 2013-02-03 NOTE — Addendum Note (Signed)
Encounter addended by: Deirdre Evener, RN on: 02/03/2013  7:46 PM<BR>     Documentation filed: Inpatient MAR

## 2013-02-04 ENCOUNTER — Encounter: Payer: Self-pay | Admitting: Radiation Oncology

## 2013-02-04 ENCOUNTER — Ambulatory Visit
Admission: RE | Admit: 2013-02-04 | Discharge: 2013-02-04 | Disposition: A | Discharge status: Home / Self Care | Payer: Medicare Other | Source: Ambulatory Visit | Attending: Radiation Oncology | Admitting: Radiation Oncology

## 2013-02-04 VITALS — BP 150/92 | HR 105 | Temp 97.8°F | Resp 20 | Wt 290.1 lb

## 2013-02-04 DIAGNOSIS — C50912 Malignant neoplasm of unspecified site of left female breast: Secondary | ICD-10-CM

## 2013-02-04 NOTE — Progress Notes (Signed)
Weekly Management Note:  Site: Left breast Current Dose:  180  cGy Projected Dose: 4680  cGy  Narrative: The patient is seen today for routine under treatment assessment. CBCT/MVCT images/port films were reviewed. The chart was reviewed.   The patient was seen by Dr. Dalbert Batman yesterday, and he did not feel that the patient had either cellulitis inflammatory breast cancer. He did not feel that the skin findings were noted a punch skin biopsy.  Physical Examination:  Filed Vitals:   02/04/13 1622  BP: 150/92  Pulse: 105  Temp: 97.8 F (36.6 C)  Resp: 20  .  Weight: 290 lb 1.6 oz (131.588 kg). There is diffuse mild erythema along the entire breast with slight breast edema. There is no associated warmth on palpation. No masses are appreciated.  Impression: Tolerating radiation therapy well. I'm in agreement with Dr. Dalbert Batman that she does not have infection or inflammatory breast cancer.  Plan: Continue radiation therapy as planned.

## 2013-02-04 NOTE — Progress Notes (Signed)
Simulation verification note: The patient underwent similar to verification for treatment to her left breast on April 28 day. Her isocenter was in good position and the multileaf collimators contoured the treatment volume appropriately.

## 2013-02-04 NOTE — Progress Notes (Signed)
Patient here patient stated  weekly rad tx left breast has completed 1 tx, alert,orientd x3, steady gait, denys pain, tenderness/soreness in breast, slight pink color on left breast, skin intact, she stated she saw Dr.Ingram yesterday,will start radiaplex cream today when she gets home 4:24 PM

## 2013-02-05 ENCOUNTER — Ambulatory Visit
Admission: RE | Admit: 2013-02-05 | Discharge: 2013-02-05 | Disposition: A | Discharge status: Home / Self Care | Payer: Medicare Other | Source: Ambulatory Visit | Attending: Radiation Oncology | Admitting: Radiation Oncology

## 2013-02-06 ENCOUNTER — Ambulatory Visit
Admission: RE | Admit: 2013-02-06 | Discharge: 2013-02-06 | Disposition: A | Discharge status: Home / Self Care | Payer: Medicare Other | Source: Ambulatory Visit | Attending: Radiation Oncology | Admitting: Radiation Oncology

## 2013-02-07 ENCOUNTER — Ambulatory Visit: Payer: Medicare Other

## 2013-02-10 ENCOUNTER — Ambulatory Visit
Admission: RE | Admit: 2013-02-10 | Discharge: 2013-02-10 | Disposition: A | Discharge status: Home / Self Care | Payer: Medicare Other | Source: Ambulatory Visit | Attending: Radiation Oncology | Admitting: Radiation Oncology

## 2013-02-10 VITALS — BP 148/90 | HR 90 | Temp 98.8°F | Resp 20 | Wt 286.7 lb

## 2013-02-10 DIAGNOSIS — C50412 Malignant neoplasm of upper-outer quadrant of left female breast: Secondary | ICD-10-CM

## 2013-02-10 NOTE — Progress Notes (Signed)
   Weekly Management Note: outpatient Current Dose:  7.2 Gy  Projected Dose: 60.8 Gy   Narrative:  The patient presents for routine under treatment assessment.  CBCT/MVCT images/Port film x-rays were reviewed.  The chart was checked. Doing well, but P-A-C site is hurting - hasn't been flushed for a few months. Released by Dr Dalbert Batman for RT.  Physical Findings:  weight is 286 lb 11.2 oz (130.046 kg). Her oral temperature is 98.8 F (37.1 C). Her blood pressure is 148/90 and her pulse is 90. Her respiration is 20.  Left breast - skin unchanged: Still a little erythematous c/w baseline at consult. PAC site unremarkable  Impression:  The patient is tolerating radiotherapy.  Plan:  Continue radiotherapy as planned. Rec'd she call med/onc to discuss P-A-C pain. I am not sure if it should be assessed and /or flushed.  ________________________________   Eppie Gibson, M.D.

## 2013-02-10 NOTE — Progress Notes (Signed)
Pt reports "hurting at my porta cath x 2 days". She states "It just hurts, is sore." Upper right chest porta cath site w/o redness, swelling, skin intact. Pt denies other pain, fatigue, loss of appetite. She is applying Radiaplex to left breast tx area.

## 2013-02-11 ENCOUNTER — Ambulatory Visit
Admission: RE | Admit: 2013-02-11 | Discharge: 2013-02-11 | Disposition: A | Discharge status: Home / Self Care | Payer: Medicare Other | Source: Ambulatory Visit | Attending: Radiation Oncology | Admitting: Radiation Oncology

## 2013-02-12 ENCOUNTER — Ambulatory Visit: Payer: Medicare Other

## 2013-02-13 ENCOUNTER — Ambulatory Visit: Payer: Medicare Other

## 2013-02-14 ENCOUNTER — Ambulatory Visit: Admission: RE | Admit: 2013-02-14 | Payer: Medicare Other | Source: Ambulatory Visit

## 2013-02-14 ENCOUNTER — Telehealth: Payer: Self-pay | Admitting: *Deleted

## 2013-02-14 ENCOUNTER — Other Ambulatory Visit: Payer: Self-pay | Admitting: Radiation Oncology

## 2013-02-14 ENCOUNTER — Encounter: Payer: Self-pay | Admitting: *Deleted

## 2013-02-14 DIAGNOSIS — C50412 Malignant neoplasm of upper-outer quadrant of left female breast: Secondary | ICD-10-CM

## 2013-02-14 NOTE — Progress Notes (Signed)
Clinical Social Work received referral to contact patient.  CSW not instructed of reason for referral. CSW contacted patient by phone, Amber Bautista states she was unable to get to her radiation appointment today because her daughter is sick.  She shared she is a single mother and is "doing the best she can".  She stated she will be at her radiation appointment on Monday.  CSW encouraged patient to contact CSW for support.  CSW will follow as needed.  [name], MSW, [place] Worker [facility] [phone]

## 2013-02-14 NOTE — Telephone Encounter (Signed)
CALLED LAUREN MULLIS TO ASK HER TO SEE THIS PATIENT AND DR. SQUIRE STATES THAT IT IS URGENT, LVM FOR A RETURN CALL FROM Ander Purpura

## 2013-02-17 ENCOUNTER — Ambulatory Visit
Admission: RE | Admit: 2013-02-17 | Discharge: 2013-02-17 | Disposition: A | Discharge status: Home / Self Care | Payer: Medicare Other | Source: Ambulatory Visit | Attending: Radiation Oncology | Admitting: Radiation Oncology

## 2013-02-17 ENCOUNTER — Other Ambulatory Visit: Payer: Self-pay | Admitting: *Deleted

## 2013-02-17 ENCOUNTER — Telehealth: Payer: Self-pay | Admitting: *Deleted

## 2013-02-17 ENCOUNTER — Telehealth: Payer: Self-pay | Admitting: Hematology & Oncology

## 2013-02-17 VITALS — BP 156/111 | HR 81 | Temp 98.0°F | Ht 68.0 in | Wt 284.2 lb

## 2013-02-17 DIAGNOSIS — C50412 Malignant neoplasm of upper-outer quadrant of left female breast: Secondary | ICD-10-CM

## 2013-02-17 MED ORDER — HEPARIN SOD (PORK) LOCK FLUSH 100 UNIT/ML IV SOLN
500.0000 [IU] | Freq: Once | INTRAVENOUS | Status: DC
  Filled 2013-02-17: qty 5

## 2013-02-17 MED ORDER — SODIUM CHLORIDE 0.9 % IJ SOLN
10.0000 mL | Freq: Once | INTRAMUSCULAR | Status: DC
  Filled 2013-02-17: qty 10

## 2013-02-17 NOTE — Telephone Encounter (Signed)
Pt called stating that her "port area has started hurting". Asked her several questions regarding any new arm activity, injury, seatbelt rub, etc and it was discovered that she has lost about 20 lbs and starting XRT. She stated that she is having to raise her arms over her head for about 7-10 minutes everyday for XRT. Will have scheduling set her up with a flush appt for tomorrow while she is at the [facility] location for XRT.

## 2013-02-17 NOTE — Progress Notes (Signed)
Ms/ Amber Bautista has received 5 fractions to her left breast.  She missed treatment on Wed., Thursday, and Friday of last week because her 43 year-old daughter was sick.  Her left breast with erythema and warmth at the present time.   Her inframmary fold is intact. She C/O occasional. Shooting pains in her breast and it was explained to her that this is caused by the nerve endings which have not re-attached completely since surgery, and that this may take several more months to cease sine radiation can cause a temporary increase in this irritation.    She is afebrile today.  Her BP is elevated since she did not take her  Dyazide today.

## 2013-02-17 NOTE — Telephone Encounter (Signed)
Left pt message with 5-13 appointment

## 2013-02-17 NOTE — Progress Notes (Signed)
   Weekly Management Note: Outpatient Current Dose:  10.8 Gy  Projected Dose: 60.8 Gy   Narrative:  The patient presents for routine under treatment assessment.  CBCT/MVCT images/Port film x-rays were reviewed.  The chart was checked. She is doing well - but her daughter was sick and the pt has limited contacts so she had to stay home with her and missed 3 treatments. Social Work aware  Physical Findings:  height is 5\' 8"  (1.727 m) and weight is 284 lb 3.2 oz (128.912 kg). Her temperature is 98 F (36.7 C). Her blood pressure is 156/111 and her pulse is 81.  stable erythema, left breast  Impression:  The patient is tolerating radiotherapy.  Plan:  Continue radiotherapy as planned.  ________________________________   Eppie Gibson, M.D.

## 2013-02-18 ENCOUNTER — Ambulatory Visit (HOSPITAL_BASED_OUTPATIENT_CLINIC_OR_DEPARTMENT_OTHER): Payer: Medicare Other

## 2013-02-18 ENCOUNTER — Ambulatory Visit
Admission: RE | Admit: 2013-02-18 | Discharge: 2013-02-18 | Disposition: A | Discharge status: Home / Self Care | Payer: Medicare Other | Source: Ambulatory Visit | Attending: Radiation Oncology | Admitting: Radiation Oncology

## 2013-02-18 VITALS — BP 173/96 | HR 89 | Temp 97.5°F

## 2013-02-18 DIAGNOSIS — C50419 Malignant neoplasm of upper-outer quadrant of unspecified female breast: Secondary | ICD-10-CM

## 2013-02-18 DIAGNOSIS — Z452 Encounter for adjustment and management of vascular access device: Secondary | ICD-10-CM

## 2013-02-18 MED ORDER — HEPARIN SOD (PORK) LOCK FLUSH 100 UNIT/ML IV SOLN
500.0000 [IU] | Freq: Once | INTRAVENOUS | Status: AC
  Administered 2013-02-18: 500 [IU] via INTRAVENOUS
  Filled 2013-02-18: qty 5

## 2013-02-18 MED ORDER — SODIUM CHLORIDE 0.9 % IJ SOLN
10.0000 mL | INTRAMUSCULAR | Status: DC | PRN
  Administered 2013-02-18: 10 mL via INTRAVENOUS
  Filled 2013-02-18: qty 10

## 2013-02-18 NOTE — Progress Notes (Signed)
Patient states she has been having pain near her port area. Port not tender to touch today, no redness, or swelling noted. Port flushed with ease and gave positive blood return. Patient stated she was not aware her port needed to be flushed every 6 to 8 weeks for maintenance. Patient sent to scheduling for her next port flush appointment. Patient also verbalized understanding she needs to call our office should she have any further questions or concerns.

## 2013-02-18 NOTE — Patient Instructions (Addendum)

## 2013-02-19 ENCOUNTER — Ambulatory Visit
Admission: RE | Admit: 2013-02-19 | Discharge: 2013-02-19 | Disposition: A | Discharge status: Home / Self Care | Payer: Medicare Other | Source: Ambulatory Visit | Attending: Radiation Oncology | Admitting: Radiation Oncology

## 2013-02-20 ENCOUNTER — Ambulatory Visit
Admission: RE | Admit: 2013-02-20 | Discharge: 2013-02-20 | Disposition: A | Discharge status: Home / Self Care | Payer: Medicare Other | Source: Ambulatory Visit | Attending: Radiation Oncology | Admitting: Radiation Oncology

## 2013-02-21 ENCOUNTER — Encounter: Payer: Self-pay | Admitting: Radiation Oncology

## 2013-02-21 ENCOUNTER — Ambulatory Visit
Admission: RE | Admit: 2013-02-21 | Discharge: 2013-02-21 | Disposition: A | Discharge status: Home / Self Care | Payer: Medicare Other | Source: Ambulatory Visit | Attending: Radiation Oncology | Admitting: Radiation Oncology

## 2013-02-24 ENCOUNTER — Ambulatory Visit: Payer: Medicare Other

## 2013-02-25 ENCOUNTER — Ambulatory Visit: Payer: Medicare Other

## 2013-02-26 ENCOUNTER — Telehealth: Payer: Self-pay | Admitting: Hematology & Oncology

## 2013-02-26 ENCOUNTER — Other Ambulatory Visit (HOSPITAL_BASED_OUTPATIENT_CLINIC_OR_DEPARTMENT_OTHER): Payer: Medicare Other | Admitting: Lab

## 2013-02-26 ENCOUNTER — Telehealth: Payer: Self-pay | Admitting: *Deleted

## 2013-02-26 ENCOUNTER — Ambulatory Visit: Admission: RE | Admit: 2013-02-26 | Payer: Medicare Other | Source: Ambulatory Visit

## 2013-02-26 ENCOUNTER — Ambulatory Visit (HOSPITAL_BASED_OUTPATIENT_CLINIC_OR_DEPARTMENT_OTHER): Payer: Medicare Other | Admitting: Hematology & Oncology

## 2013-02-26 ENCOUNTER — Ambulatory Visit (HOSPITAL_BASED_OUTPATIENT_CLINIC_OR_DEPARTMENT_OTHER): Payer: Medicare Other

## 2013-02-26 VITALS — BP 151/89 | HR 89 | Temp 98.2°F | Resp 16 | Ht 68.0 in | Wt 285.0 lb

## 2013-02-26 DIAGNOSIS — D3A098 Benign carcinoid tumors of other sites: Secondary | ICD-10-CM

## 2013-02-26 DIAGNOSIS — C7A00 Malignant carcinoid tumor of unspecified site: Secondary | ICD-10-CM

## 2013-02-26 DIAGNOSIS — C50419 Malignant neoplasm of upper-outer quadrant of unspecified female breast: Secondary | ICD-10-CM

## 2013-02-26 DIAGNOSIS — C50412 Malignant neoplasm of upper-outer quadrant of left female breast: Secondary | ICD-10-CM

## 2013-02-26 LAB — CBC WITH DIFFERENTIAL (CANCER CENTER ONLY)
BASO%: 0.1 % (ref 0.0–2.0)
EOS%: 8.1 % — ABNORMAL HIGH (ref 0.0–7.0)
LYMPH#: 1.5 10*3/uL (ref 0.9–3.3)
MCH: 28.7 pg (ref 26.0–34.0)
MCHC: 31.5 g/dL — ABNORMAL LOW (ref 32.0–36.0)
MONO%: 7.3 % (ref 0.0–13.0)
NEUT#: 4.8 10*3/uL (ref 1.5–6.5)
Platelets: 283 10*3/uL (ref 145–400)
RDW: 15.1 % (ref 11.1–15.7)

## 2013-02-26 MED ORDER — OCTREOTIDE ACETATE 30 MG IM KIT
30.0000 mg | PACK | Freq: Once | INTRAMUSCULAR | Status: AC
  Administered 2013-02-26: 30 mg via INTRAMUSCULAR

## 2013-02-26 NOTE — Telephone Encounter (Signed)
Amber Bautista at [facility] scheduled MRI for 6-4 at 38 at [place] across from [facility]. Pt aware. Per Dr. Marin Olp ok to cancel 6-18 Rad Onc appointment. Per pt she is going to cx her Rad Onc Tx. Rhonda at [employer] aware I cancelled 6-18 appointment, because pt needs to see Dr. Marin Olp. Baxter Flattery is getting pre cert and I will fax orders.

## 2013-02-26 NOTE — Telephone Encounter (Signed)
Amber Bautista has not shown for treatment for 02/24/13, 02/25/13, nor 02/26/13.  Unable to reach her by phone. A voice message was left on her home phone to please call to discuss what she would like to do regarding the resumption of her radiation therapy.  Called Her cell phone and received the message that it was not a working number.  Called her Mother's cell, since she is listed as a contact and received the message that  Her cell phone is no longer in service.  Dr. Isidore Moos notified.

## 2013-02-26 NOTE — Progress Notes (Signed)
This office note has been dictated.

## 2013-02-26 NOTE — Patient Instructions (Addendum)

## 2013-02-26 NOTE — Telephone Encounter (Signed)
Faxed order for MRI to [facility]

## 2013-02-27 ENCOUNTER — Telehealth: Payer: Self-pay | Admitting: Hematology & Oncology

## 2013-02-27 ENCOUNTER — Ambulatory Visit: Payer: Medicare Other

## 2013-02-27 NOTE — Progress Notes (Signed)
CC:   Amber Bautista. Amber Bautista, M.D.  DIAGNOSES: 1. Stage IIB (T2 N1 M0) ductal carcinoma of the left breast, triple-     negative. 2. Metastatic carcinoid, liver mets.  CURRENT THERAPY: 1. Sandostatin 30 mg subcu q. month. 2. The patient has completed adjuvant chemotherapy with 4 cycles of     carboplatin/Taxotere. 3. The patient has had 3 weeks of radiation therapy and wishes to stop     any further therapy.  INTERIM HISTORY:  Amber Bautista comes in for followup.  She has made the decision that she does not want any further radiation.  I talked to her at length about this.  It looks like she has had about 3 weeks' worth. I told her that ideally she should complete 6 cycles of treatment.  She only had a lumpectomy.  She had 1 positive lymph node.  She tolerated her chemotherapy fairly well.  She says that she just does not feel that radiation is going to help her anymore.  She is just tired of not feeling well.  I told her that radiation does make her feel tired, but otherwise the patient has tolerated it well.  She just does not feel confident taking any further treatments.  I will respect this.  It is her own decision.  I explained to her the risk that she is taking with respect to increasing the chance of her cancer coming back locally.  She says that if it does, she will just have her breast taken off.  As far as the carcinoid is concerned, thankfully, this has not been much of an active issue.  We really need to re-evaluate her carcinoid.  The last time she had an MRI, I think, was sometime last year in August or May.  When we last checked her chromogranin A level, it was 72 back in April. This is really relatively stable.  She did have intrahepatic therapy for the carcinoid at [facility].  It sounds like she does have some issues at home that she is trying to deal with.  She has been through a whole lot.  She has had no problems with wheezing.  There has been no cough.   There has been no swelling.  She has had no flushing.  She has had no diarrhea.  She has had no shortness of breath.  There is no dyspnea on exertion.  Her last echocardiogram done back in November showed an ejection fraction of 25-30%.  We went ahead and repeated a MUGA scan.  The MUGA scan showed an ejection fraction of 51%.  PHYSICAL EXAMINATION:  General:  This is an obese Hispanic female in no obvious distress.  Vital signs:  Temperature of 98.2, pulse 87, respiratory rate 18, blood pressure 151/89.  Weight is 285.  Head and neck:  Normocephalic, atraumatic skull.  There are no ocular or oral lesions.  There are no palpable cervical or supraclavicular lymph nodes. Lungs:  Clear bilaterally.  There are no rales, wheezes, or rhonchi. Cardiac:  Regular rate and rhythm, with a normal S1 and S2.  There are no murmurs, rubs or bruits.  Breasts:  Right breast with no masses, edema or erythema.  There is no right axillary adenopathy.  Left breast shows a well-healed lumpectomy.  This is at the 2 o'clock position. There is some slight erythema from radiation on the left breast.  There is no tenderness to palpation.  No left axillary adenopathy is noted. Abdomen:  Soft.  She has  good bowel sounds.  There is no fluid wave. There is no palpable hepatosplenomegaly.  Back:  No tenderness over the spine, ribs, or hips.  Extremities:  Show no clubbing, cyanosis or edema.  Neurological:  Shows no focal neurological deficits.  LABORATORY STUDIES:  White cell count is 7.5, hemoglobin 10.8, hematocrit 34.3, platelet count 283,000.  IMPRESSION:  Amber Bautista is a very charming 42 year old Hispanic female. She has triple-negative breast cancer.  Given her age, I think she is going to need to be checked for BRCA.  I will have to see if this has been done already.  Of course, with the Epic system, I really cannot tell if this has been done.  Again, we will get her back in a month.  I do need to make  sure that she has been checked for the BRCA gene.  I am just a little concerned about the fact that she does not want any further radiation.  Again, this is her decision and I certainly must respect this.    ______________________________ Volanda Napoleon, M.D. PRE/MEDQ  D:  02/26/2013  T:  02/27/2013  Job:  [id number]

## 2013-02-27 NOTE — Telephone Encounter (Signed)
Amber Bautista at [facility] scheduling called [phone] or [name] [phone] wanted to clarify MRI order. Per last time pt had with and without Abd MRI. Dr. Marin Olp said abd with would be fine. I left Amber Bautista message that the current test pre certed was fine.

## 2013-02-28 ENCOUNTER — Other Ambulatory Visit: Payer: Self-pay | Admitting: *Deleted

## 2013-02-28 ENCOUNTER — Telehealth: Payer: Self-pay | Admitting: Hematology & Oncology

## 2013-02-28 ENCOUNTER — Ambulatory Visit: Payer: Medicare Other | Admitting: Hematology & Oncology

## 2013-02-28 ENCOUNTER — Ambulatory Visit: Payer: Medicare Other

## 2013-02-28 ENCOUNTER — Other Ambulatory Visit: Payer: Medicare Other | Admitting: Lab

## 2013-02-28 DIAGNOSIS — D3A098 Benign carcinoid tumors of other sites: Secondary | ICD-10-CM

## 2013-02-28 NOTE — Telephone Encounter (Signed)
Received call from [place] at [facility] changed MRI to with /without. Faxed new order and auth number to them. I also left messages on [place] and [place] voice mail

## 2013-03-04 ENCOUNTER — Ambulatory Visit: Payer: Medicare Other

## 2013-03-04 LAB — COMPREHENSIVE METABOLIC PANEL
ALT: 40 U/L — ABNORMAL HIGH (ref 0–35)
AST: 28 U/L (ref 0–37)
Albumin: 3.6 g/dL (ref 3.5–5.2)
Alkaline Phosphatase: 95 U/L (ref 39–117)
Calcium: 9.1 mg/dL (ref 8.4–10.5)
Chloride: 98 mEq/L (ref 96–112)
Creatinine, Ser: 0.95 mg/dL (ref 0.50–1.10)
Potassium: 3.3 mEq/L — ABNORMAL LOW (ref 3.5–5.3)

## 2013-03-05 ENCOUNTER — Encounter: Payer: Self-pay | Admitting: Radiation Oncology

## 2013-03-05 ENCOUNTER — Ambulatory Visit: Payer: Medicare Other

## 2013-03-05 NOTE — Progress Notes (Signed)
I was able to reach Amber Bautista by phone today.  I had been informed by Dr. Marin Olp that she has decided to stop radiotherapy. She had no showed for multiple appointments and my staff has had trouble reaching her.  [name] remains firm that she doesn't want to continue RT.  She does not want any more side effects.  She had some mild skin irritation and fatigue - but she is essentially mentally done with pursuing more treatments.  She understands her risk of a local recurrence is significantly highly without completing RT.  She did agree to come in for a followup in 1 month. We will arrange that.  She expressed appreciation for the call.  -----------------------------------  Eppie Gibson, MD

## 2013-03-06 ENCOUNTER — Ambulatory Visit: Payer: Medicare Other

## 2013-03-07 ENCOUNTER — Ambulatory Visit: Payer: Medicare Other

## 2013-03-10 ENCOUNTER — Ambulatory Visit: Payer: Medicare Other

## 2013-03-10 NOTE — Progress Notes (Signed)
  Radiation Oncology         [phone] ________________________________  Name: Amber Bautista MRN: [id number]  Date: 02/03/2013  DOB: May 21, 1971  Simulation Verification Note  Status: outpatient - left breast/axilla  NARRATIVE: The patient was brought to the treatment unit and placed in the planned treatment position. The clinical setup was verified. Then port films were obtained and uploaded to the radiation oncology medical record software.  The treatment beams were carefully compared against the planned radiation fields. The position location and shape of the radiation fields was reviewed. They targeted volume of tissue appears to be appropriately covered by the radiation beams. Organs at risk appear to be excluded as planned.  Based on my personal review, I approved the simulation verification. The patient's treatment will proceed as planned.  -----------------------------------  Eppie Gibson, MD

## 2013-03-10 NOTE — Progress Notes (Signed)
  Radiation Oncology         [phone] ________________________________  Name: Amber Bautista MRN: [id number]  Date: 02/21/2013  DOB: 1971/09/25  End of Treatment Note  Diagnosis:   Cancer of upper-outer quadrant of female breast, left. Stage II, pT2, pN75mic. Triple negative.  Indication for treatment:  Curative   Radiation treatment dates:  02/04/2013-02/21/2013  Site/dose:    1) Left Breast and Axilla / 46.8 Gy in 26 fractions were planned (she only received 18 Gy in 10 fractions) 2) Left Breast boost / 14 Gy in 7 fractions (not received)   Beams/energy:    1)  Tangents / 10 and 18 MV photons 2) not planned; patient stopped treatments  Narrative: The patient tolerated radiation treatment relatively well with minimal side effects (fatigue, mild skin irritation). However, she repeatedly no-showed for appointments and ultimately was not reachable by phone.  Social work was involved.  Ultimately, Dr. Isidore Moos connected with her by phone: [name] informed her that she did not want any more cancer treatments She did not want any more fatigue or side effects. [name] understands that her risk of recurrence is significantly higher without radiotherapy.   Plan: The patient has completed radiation treatment. The patient will return to radiation oncology clinic for routine followup in one month. I advised them to call or return sooner if they have any questions or concerns related to their recovery or treatment.  -----------------------------------  Eppie Gibson, MD

## 2013-03-11 ENCOUNTER — Ambulatory Visit: Payer: Medicare Other

## 2013-03-12 ENCOUNTER — Ambulatory Visit: Payer: Medicare Other

## 2013-03-13 ENCOUNTER — Ambulatory Visit: Payer: Medicare Other

## 2013-03-14 ENCOUNTER — Ambulatory Visit: Payer: Medicare Other

## 2013-03-17 ENCOUNTER — Ambulatory Visit: Payer: Medicare Other

## 2013-03-18 ENCOUNTER — Ambulatory Visit: Payer: Medicare Other

## 2013-03-19 ENCOUNTER — Ambulatory Visit: Payer: Medicare Other

## 2013-03-20 ENCOUNTER — Ambulatory Visit: Payer: Medicare Other

## 2013-03-21 ENCOUNTER — Ambulatory Visit: Payer: Medicare Other

## 2013-03-21 ENCOUNTER — Ambulatory Visit: Payer: Medicare Other | Admitting: Radiation Oncology

## 2013-03-24 ENCOUNTER — Ambulatory Visit: Payer: Medicare Other

## 2013-03-25 ENCOUNTER — Telehealth: Payer: Self-pay | Admitting: Hematology & Oncology

## 2013-03-25 ENCOUNTER — Ambulatory Visit: Payer: Medicare Other

## 2013-03-25 NOTE — Telephone Encounter (Signed)
Patient called anc cx 03/26/13 apt and stated she will call back to reshc

## 2013-03-26 ENCOUNTER — Ambulatory Visit: Payer: Medicare Other

## 2013-03-26 ENCOUNTER — Ambulatory Visit: Payer: Medicare Other | Admitting: Hematology & Oncology

## 2013-03-26 ENCOUNTER — Other Ambulatory Visit: Payer: Medicare Other | Admitting: Lab

## 2013-03-27 ENCOUNTER — Ambulatory Visit: Payer: Medicare Other

## 2013-03-28 ENCOUNTER — Ambulatory Visit: Payer: Medicare Other

## 2013-03-31 ENCOUNTER — Ambulatory Visit: Payer: Medicare Other

## 2013-04-01 ENCOUNTER — Ambulatory Visit: Payer: Medicare Other

## 2013-04-02 ENCOUNTER — Ambulatory Visit: Payer: Medicare Other

## 2013-04-02 ENCOUNTER — Encounter (INDEPENDENT_AMBULATORY_CARE_PROVIDER_SITE_OTHER): Payer: Self-pay | Admitting: General Surgery

## 2013-04-03 ENCOUNTER — Ambulatory Visit: Payer: Medicare Other

## 2013-04-04 ENCOUNTER — Ambulatory Visit: Payer: Medicare Other

## 2013-04-10 ENCOUNTER — Encounter: Payer: Self-pay | Admitting: Radiation Oncology

## 2013-04-15 ENCOUNTER — Ambulatory Visit: Admission: RE | Admit: 2013-04-15 | Payer: Medicare Other | Source: Ambulatory Visit | Admitting: Radiation Oncology

## 2013-04-15 HISTORY — DX: Personal history of irradiation: Z92.3

## 2013-05-19 ENCOUNTER — Telehealth: Payer: Self-pay | Admitting: Hematology & Oncology

## 2013-05-19 NOTE — Telephone Encounter (Signed)
Pt called made 9-5 appointment pt is aware could wait up to an hour to see MD. MD aware pt called and scheduled appointment.

## 2013-06-13 ENCOUNTER — Other Ambulatory Visit: Payer: Medicare Other | Admitting: Lab

## 2013-06-13 ENCOUNTER — Ambulatory Visit: Payer: Medicare Other | Admitting: Hematology & Oncology

## 2013-06-13 ENCOUNTER — Telehealth: Payer: Self-pay | Admitting: Hematology & Oncology

## 2013-06-13 NOTE — Telephone Encounter (Signed)
Pt called said she had car trouble reschedule 9-5 to 10-17 I offered her afternoons much sooner but she said she had to have mornings

## 2013-07-24 ENCOUNTER — Other Ambulatory Visit: Payer: Self-pay | Admitting: *Deleted

## 2013-07-24 DIAGNOSIS — D3A098 Benign carcinoid tumors of other sites: Secondary | ICD-10-CM

## 2013-07-25 ENCOUNTER — Ambulatory Visit: Payer: Medicare Other | Admitting: Hematology & Oncology

## 2013-07-25 ENCOUNTER — Other Ambulatory Visit: Payer: Medicare Other | Admitting: Lab

## 2013-08-14 ENCOUNTER — Other Ambulatory Visit: Payer: Self-pay

## 2014-07-06 ENCOUNTER — Telehealth: Payer: Self-pay | Admitting: Hematology & Oncology

## 2014-07-06 NOTE — Telephone Encounter (Signed)
Pt scheduled 10-15 appointment.

## 2014-07-22 ENCOUNTER — Other Ambulatory Visit: Payer: Self-pay | Admitting: *Deleted

## 2014-07-22 DIAGNOSIS — C50419 Malignant neoplasm of upper-outer quadrant of unspecified female breast: Secondary | ICD-10-CM

## 2014-07-23 ENCOUNTER — Other Ambulatory Visit: Payer: Medicare Other | Admitting: Lab

## 2014-07-23 ENCOUNTER — Ambulatory Visit: Payer: Medicare Other | Admitting: Family

## 2014-07-24 ENCOUNTER — Other Ambulatory Visit: Payer: Self-pay

## 2014-11-30 ENCOUNTER — Telehealth: Payer: Self-pay | Admitting: Hematology & Oncology

## 2014-11-30 ENCOUNTER — Encounter: Payer: Self-pay | Admitting: Hematology & Oncology

## 2014-11-30 NOTE — Telephone Encounter (Signed)
Received referral pt needs to be seen this week. Pt would not come in this week, she scheduled 2-29 appointment. I left Amber Bautista at referring message with 2-29 appointment

## 2014-12-07 ENCOUNTER — Other Ambulatory Visit (HOSPITAL_BASED_OUTPATIENT_CLINIC_OR_DEPARTMENT_OTHER): Payer: Medicare HMO | Admitting: Lab

## 2014-12-07 ENCOUNTER — Encounter: Payer: Self-pay | Admitting: Family

## 2014-12-07 ENCOUNTER — Ambulatory Visit (HOSPITAL_BASED_OUTPATIENT_CLINIC_OR_DEPARTMENT_OTHER): Payer: Medicare HMO | Admitting: Family

## 2014-12-07 VITALS — BP 153/95 | HR 105 | Temp 99.0°F | Resp 16 | Ht 68.0 in | Wt 322.0 lb

## 2014-12-07 DIAGNOSIS — C50412 Malignant neoplasm of upper-outer quadrant of left female breast: Secondary | ICD-10-CM

## 2014-12-07 DIAGNOSIS — C50419 Malignant neoplasm of upper-outer quadrant of unspecified female breast: Secondary | ICD-10-CM

## 2014-12-07 DIAGNOSIS — D3A098 Benign carcinoid tumors of other sites: Secondary | ICD-10-CM

## 2014-12-07 DIAGNOSIS — C7A00 Malignant carcinoid tumor of unspecified site: Secondary | ICD-10-CM

## 2014-12-07 DIAGNOSIS — C7B02 Secondary carcinoid tumors of liver: Secondary | ICD-10-CM

## 2014-12-07 DIAGNOSIS — R0602 Shortness of breath: Secondary | ICD-10-CM

## 2014-12-07 DIAGNOSIS — R197 Diarrhea, unspecified: Secondary | ICD-10-CM

## 2014-12-07 DIAGNOSIS — R109 Unspecified abdominal pain: Secondary | ICD-10-CM

## 2014-12-07 LAB — CBC WITH DIFFERENTIAL (CANCER CENTER ONLY)
BASO#: 0 10*3/uL (ref 0.0–0.2)
BASO%: 0.1 % (ref 0.0–2.0)
EOS%: 4.1 % (ref 0.0–7.0)
Eosinophils Absolute: 0.4 10*3/uL (ref 0.0–0.5)
HCT: 42.2 % (ref 34.8–46.6)
HGB: 13.1 g/dL (ref 11.6–15.9)
LYMPH#: 2.2 10*3/uL (ref 0.9–3.3)
LYMPH%: 21.5 % (ref 14.0–48.0)
MCH: 27.3 pg (ref 26.0–34.0)
MCHC: 31 g/dL — AB (ref 32.0–36.0)
MCV: 88 fL (ref 81–101)
MONO#: 0.9 10*3/uL (ref 0.1–0.9)
MONO%: 8.3 % (ref 0.0–13.0)
NEUT#: 6.8 10*3/uL — ABNORMAL HIGH (ref 1.5–6.5)
NEUT%: 66 % (ref 39.6–80.0)
PLATELETS: 314 10*3/uL (ref 145–400)
RBC: 4.79 10*6/uL (ref 3.70–5.32)
RDW: 15.1 % (ref 11.1–15.7)
WBC: 10.4 10*3/uL — AB (ref 3.9–10.0)

## 2014-12-07 NOTE — Progress Notes (Signed)
[place]  [phone] [phone]) [phone]  ID: Aurea Graff OB: 1971-05-27 MR#: [id number] Patient Care Team: Marijean Bravo, MD as PCP - General (Internal Medicine)  DIAGNOSIS: 1. Stage IIB (T2 N1 M0) ductal carcinoma of the left breast - triple negative  2. Metastatic carcinoid, liver mets  INTERVAL HISTORY: Ms. Bazzle was last seen by Dr. Marin Olp in 2014. At that time, she decided to stop treatment. She had a lumpectomy and had 1 positive lymph node. She had received 4 cycles of Carboplatin/Taxotere and 3 weeks of radiation. She had also received intrahepatic therapy for the carcinoid at [facility]. Her carcinoid tumor was removed in 2001.  She is complaining of pain in the mid abdomen that comes and goes. She states that the pain is horrible and lasts for 10 minutes at a time. She also has SOB with exertion at times.  She is also having diarrhea when she eats. She has had no scans since 2013. We will schedule these today.  She is scheduled for a mammogram on March 9th.  She has a lot of financial issues and I think this is why she stopped treatment 2 years ago despite Dr. Antonieta Pert recommendations.  She now has Medicare and is trying to get re-established with all her doctors.  She denies fever, chills, n/v, cough, rash, dizziness, headache, chest pain, palpitations, constipation, blood in urine or stool.  No swelling, tenderness, numbness or tingling in her extremities. Her appetite is down but she is staying hydrated. Her weight is stable at 322 lbs.   CURRENT TREATMENT: Pt decided in May 2014 to stop all treatment.  REVIEW OF SYSTEMS: All other 10 point review of systems is negative.   PAST MEDICAL HISTORY: Past Medical History  Diagnosis Date  . Carcinoid tumor of intestine 2001    Mets 2007  . Hypertension   . Breast cancer 07/12/12    Stage IIB Ductal Carcinoma to  Upper Outer Quadrant: Left Breast: Triple Negative  . Carcinoma  of breast treated with adjuvant chemotherapy     4 cycles of Carboplatin / Taxol  . Diarrhea     Sandostatin  . Morbidly obese   . S/P radiation therapy 02/04/2013-02/21/2013    Left Breast and Axilla / 46.8 Gy in 26 fractions were planned (she only received 18 Gy in 10 fractions)    PAST SURGICAL HISTORY: Past Surgical History  Procedure Laterality Date  . Cesarean section  1988 2007  . Ovarian cyst removal  2001  . Colon surgery  2001    Cancer/ Intestinal Resection  . Partial mastectomy with needle localization and axillary sentinel lymph node bx  08/23/2012    Procedure: PARTIAL MASTECTOMY WITH NEEDLE LOCALIZATION AND AXILLARY SENTINEL LYMPH NODE BX;  Surgeon: Adin Hector, MD;  Location: [place];  Service: General;  Laterality: Left;  blue dye injection   . Portacath placement  08/23/2012    Procedure: INSERTION PORT-A-CATH;  Surgeon: Adin Hector, MD;  Location: [facility];  Service: General;  Laterality: Right;  intraop ultrasound  . Breast surgery  08/23/12    Lt br lumpectomy    FAMILY HISTORY Family History  Problem Relation Age of Onset  . Heart attack Father   . Diabetes Father   . Cancer Paternal Grandmother   . Hypertension Mother   . Diabetes Mother   . Glaucoma Mother   . Hypertension Brother   . Asthma Brother     GYNECOLOGIC HISTORY:  No  LMP recorded.   SOCIAL HISTORY: History   Social History  . Marital Status: Divorced    Spouse Name: N/A  . Number of Children: N/A  . Years of Education: N/A   Occupational History  . Not on file.   Social History Main Topics  . Smoking status: Never Smoker   . Smokeless tobacco: Never Used     Comment: NEVER SMOKED  . Alcohol Use: No  . Drug Use: No  . Sexual Activity: Not Currently     Comment: menarche age 56, P 3, G 1, 1 misscarriage   Other Topics Concern  . Not on file   Social History Narrative    ADVANCED DIRECTIVES:  <no information>  HEALTH MAINTENANCE: History  Substance Use Topics   . Smoking status: Never Smoker   . Smokeless tobacco: Never Used     Comment: NEVER SMOKED  . Alcohol Use: No   Colonoscopy: PAP: Bone density: Lipid panel:  Allergies  Allergen Reactions  . Bactrim [Sulfamethoxazole-Trimethoprim] Hypertension    Swelling and hypertension after taking, was admitted to hospital    Current Outpatient Prescriptions  Medication Sig Dispense Refill  . Multiple Vitamin (MULTIVITAMIN) tablet Take 1 tablet by mouth daily.    Marland Kitchen triamterene-hydrochlorothiazide (DYAZIDE) 37.5-25 MG per capsule Take 1 capsule by mouth every morning.      No current facility-administered medications for this visit.    OBJECTIVE: Filed Vitals:   12/07/14 1113  BP: 153/95  Pulse: 105  Temp: 99 F (37.2 C)  Resp: 16    Filed Weights   12/07/14 1113  Weight: 322 lb (146.058 kg)   ECOG FS:1 - Symptomatic but completely ambulatory Ocular: Sclerae unicteric, pupils equal, round and reactive to light Ear-nose-throat: Oropharynx clear, dentition fair Lymphatic: No cervical or supraclavicular adenopathy Lungs no rales or rhonchi, good excursion bilaterally Heart regular rate and rhythm, no murmur appreciated Abd soft, nontender, positive bowel sounds MSK no focal spinal tenderness, no joint edema Neuro: non-focal, well-oriented, appropriate affect Breasts: No changes. No mass, lesion, rash or lymphadenopathy.   LAB RESULTS: CMP     Component Value Date/Time   NA 139 02/26/2013 0910   NA 138 11/14/2012 0926   K 3.3* 02/26/2013 0910   K 2.7* 11/14/2012 0926   CL 98 02/26/2013 0910   CL 96* 11/14/2012 0926   CO2 29 02/26/2013 0910   CO2 29 11/14/2012 0926   GLUCOSE 117* 02/26/2013 0910   GLUCOSE 168* 11/14/2012 0926   BUN 17 02/26/2013 0910   BUN 19 11/14/2012 0926   CREATININE 0.95 02/26/2013 0910   CREATININE 1.3* 11/14/2012 0926   CALCIUM 9.1 02/26/2013 0910   CALCIUM 9.5 11/14/2012 0926   PROT 7.5 02/26/2013 0910   PROT 8.1 11/14/2012 0926   ALBUMIN  3.6 02/26/2013 0910   AST 28 02/26/2013 0910   AST 21 11/14/2012 0926   ALT 40* 02/26/2013 0910   ALT 20 11/14/2012 0926   ALKPHOS 95 02/26/2013 0910   ALKPHOS 61 11/14/2012 0926   BILITOT 0.4 02/26/2013 0910   BILITOT 0.60 11/14/2012 0926   GFRNONAA >90 08/15/2012 0938   GFRAA >90 08/15/2012 0938   INo results found for: SPEP, UPEP Lab Results  Component Value Date   WBC 10.4* 12/07/2014   NEUTROABS 6.8* 12/07/2014   HGB 13.1 12/07/2014   HCT 42.2 12/07/2014   MCV 88 12/07/2014   PLT 314 12/07/2014   No results found for: LABCA2 No components found for: [id number] No results for input(s): INR  in the last 168 hours.  STUDIES: No results found.  ASSESSMENT/PLAN: Ms. Schray is a pleasant 44 year old Hispanic female with history of triple-negative breast cancer. She has not been seen by oncology in 2 years when she abruptly ended her treatment.  She is c/o mid abdominal pain the is "horrible", comes and goes and lasts 10 minutes at a time. She is having diarrhea when she eats. She also has SOB with exertion.  She is scheduled to have a mammogram  March 9th. We will shecedule her for a CT of the chest, abdomen and pelvis in the next week. We will see what these scans show and then formulate a plan.  We will see what her lab work shows.  We will see her back in 1 months for labs and follow-up.  She knows to call here with any questions or concerns and to go to the ED in the event of an emergency. We can certainly see her sooner if need be.    Eliezer Bottom, NP 12/07/2014 11:25 AM

## 2014-12-10 ENCOUNTER — Ambulatory Visit (HOSPITAL_BASED_OUTPATIENT_CLINIC_OR_DEPARTMENT_OTHER)
Admission: RE | Admit: 2014-12-10 | Discharge: 2014-12-10 | Disposition: A | Discharge status: Home / Self Care | Payer: Medicare HMO | Source: Ambulatory Visit | Attending: Family | Admitting: Family

## 2014-12-10 ENCOUNTER — Encounter (HOSPITAL_BASED_OUTPATIENT_CLINIC_OR_DEPARTMENT_OTHER): Payer: Self-pay

## 2014-12-10 DIAGNOSIS — C50419 Malignant neoplasm of upper-outer quadrant of unspecified female breast: Secondary | ICD-10-CM

## 2014-12-10 DIAGNOSIS — D3A098 Benign carcinoid tumors of other sites: Secondary | ICD-10-CM

## 2014-12-10 DIAGNOSIS — C50912 Malignant neoplasm of unspecified site of left female breast: Secondary | ICD-10-CM | POA: Diagnosis not present

## 2014-12-10 LAB — COMPREHENSIVE METABOLIC PANEL
ALT: 18 U/L (ref 0–35)
AST: 15 U/L (ref 0–37)
Albumin: 3.8 g/dL (ref 3.5–5.2)
Alkaline Phosphatase: 67 U/L (ref 39–117)
BUN: 17 mg/dL (ref 6–23)
CALCIUM: 9.5 mg/dL (ref 8.4–10.5)
CHLORIDE: 101 meq/L (ref 96–112)
CO2: 27 meq/L (ref 19–32)
CREATININE: 1.03 mg/dL (ref 0.50–1.10)
GLUCOSE: 101 mg/dL — AB (ref 70–99)
Potassium: 3.6 mEq/L (ref 3.5–5.3)
Sodium: 140 mEq/L (ref 135–145)
TOTAL PROTEIN: 7.5 g/dL (ref 6.0–8.3)
Total Bilirubin: 0.5 mg/dL (ref 0.2–1.2)

## 2014-12-10 LAB — CHROMOGRANIN A: CHROMOGRANIN A: 98 ng/mL — AB (ref <=15)

## 2014-12-10 MED ORDER — IOHEXOL 300 MG/ML  SOLN
100.0000 mL | Freq: Once | INTRAMUSCULAR | Status: AC | PRN
  Administered 2014-12-10: 100 mL via INTRAVENOUS

## 2014-12-17 ENCOUNTER — Other Ambulatory Visit: Payer: Self-pay | Admitting: Hematology & Oncology

## 2014-12-17 ENCOUNTER — Telehealth: Payer: Self-pay | Admitting: Family

## 2014-12-17 ENCOUNTER — Telehealth: Payer: Self-pay | Admitting: Hematology & Oncology

## 2014-12-17 ENCOUNTER — Other Ambulatory Visit: Payer: Self-pay | Admitting: Family

## 2014-12-17 DIAGNOSIS — C50912 Malignant neoplasm of unspecified site of left female breast: Secondary | ICD-10-CM

## 2014-12-17 DIAGNOSIS — R109 Unspecified abdominal pain: Secondary | ICD-10-CM | POA: Insufficient documentation

## 2014-12-17 DIAGNOSIS — D3A098 Benign carcinoid tumors of other sites: Secondary | ICD-10-CM

## 2014-12-17 DIAGNOSIS — C787 Secondary malignant neoplasm of liver and intrahepatic bile duct: Secondary | ICD-10-CM

## 2014-12-17 DIAGNOSIS — R1084 Generalized abdominal pain: Secondary | ICD-10-CM

## 2014-12-17 HISTORY — DX: Unspecified abdominal pain: R10.9

## 2014-12-17 MED ORDER — TRAMADOL HCL 50 MG PO TABS: 50.0000 mg | ORAL_TABLET | Freq: Four times a day (QID) | ORAL | Status: DC | PRN

## 2014-12-17 NOTE — Telephone Encounter (Signed)
Called [facility] to schedule scan per Vivien Rota in scheduling. No answer or voice mail

## 2014-12-17 NOTE — Telephone Encounter (Signed)
I just spoke with Amber Bautista over the phone. We are scheduling her for an Octreoscan next week. She states that her abdominal pain is worsening and that she is still having frequent diarrhea. She is going to pick up her prescription for Ultram tomorrow morning.

## 2014-12-18 ENCOUNTER — Telehealth: Payer: Self-pay | Admitting: Hematology & Oncology

## 2014-12-18 NOTE — Telephone Encounter (Signed)
Pt aware of scan at [facility] on 3-22 at 1 pm. She has prescription for meds she needs to take. Cheryl scheduled

## 2014-12-21 ENCOUNTER — Telehealth: Payer: Self-pay | Admitting: Hematology & Oncology

## 2014-12-21 NOTE — Telephone Encounter (Signed)
Faxed medical records to:  [facility] [address] [place]. [place], [place] [address]  P: [phone] F: [id number]     COPY SCANNED

## 2014-12-21 NOTE — Telephone Encounter (Signed)
NA

## 2014-12-29 ENCOUNTER — Encounter (HOSPITAL_COMMUNITY)
Admission: RE | Admit: 2014-12-29 | Discharge: 2014-12-29 | Disposition: A | Discharge status: Home / Self Care | Payer: Medicare HMO | Source: Ambulatory Visit | Attending: Hematology & Oncology | Admitting: Hematology & Oncology

## 2014-12-29 DIAGNOSIS — D3A098 Benign carcinoid tumors of other sites: Secondary | ICD-10-CM

## 2014-12-29 DIAGNOSIS — C50912 Malignant neoplasm of unspecified site of left female breast: Secondary | ICD-10-CM

## 2014-12-29 DIAGNOSIS — C787 Secondary malignant neoplasm of liver and intrahepatic bile duct: Secondary | ICD-10-CM | POA: Diagnosis not present

## 2014-12-29 DIAGNOSIS — C189 Malignant neoplasm of colon, unspecified: Secondary | ICD-10-CM | POA: Insufficient documentation

## 2014-12-29 MED ORDER — INDIUM IN-111 PENTETREOTIDE IV KIT
6.0000 | PACK | Freq: Once | INTRAVENOUS | Status: AC | PRN
  Administered 2014-12-29: 6 via INTRAVENOUS

## 2014-12-30 ENCOUNTER — Encounter (HOSPITAL_COMMUNITY)
Admission: RE | Admit: 2014-12-30 | Discharge: 2014-12-30 | Disposition: A | Discharge status: Home / Self Care | Payer: Medicare HMO | Source: Ambulatory Visit | Attending: Hematology & Oncology | Admitting: Hematology & Oncology

## 2014-12-31 ENCOUNTER — Encounter (HOSPITAL_COMMUNITY)
Admission: RE | Admit: 2014-12-31 | Discharge: 2014-12-31 | Disposition: A | Discharge status: Home / Self Care | Payer: Medicare HMO | Source: Ambulatory Visit | Attending: Hematology & Oncology | Admitting: Hematology & Oncology

## 2014-12-31 DIAGNOSIS — C50912 Malignant neoplasm of unspecified site of left female breast: Secondary | ICD-10-CM | POA: Diagnosis not present

## 2014-12-31 MED ORDER — INDIUM IN-111 PENTETREOTIDE IV KIT
6.0000 | PACK | Freq: Once | INTRAVENOUS | Status: AC | PRN
  Administered 2014-12-31: 6 via INTRAVENOUS

## 2015-01-05 ENCOUNTER — Ambulatory Visit (HOSPITAL_BASED_OUTPATIENT_CLINIC_OR_DEPARTMENT_OTHER): Payer: Medicare HMO | Admitting: Hematology & Oncology

## 2015-01-05 ENCOUNTER — Encounter: Payer: Self-pay | Admitting: Hematology & Oncology

## 2015-01-05 ENCOUNTER — Other Ambulatory Visit (HOSPITAL_BASED_OUTPATIENT_CLINIC_OR_DEPARTMENT_OTHER): Payer: Medicare HMO

## 2015-01-05 VITALS — BP 124/87 | HR 92 | Temp 98.0°F | Resp 16 | Ht 67.0 in | Wt 316.0 lb

## 2015-01-05 DIAGNOSIS — C7B02 Secondary carcinoid tumors of liver: Secondary | ICD-10-CM

## 2015-01-05 DIAGNOSIS — C7A00 Malignant carcinoid tumor of unspecified site: Secondary | ICD-10-CM

## 2015-01-05 DIAGNOSIS — C50912 Malignant neoplasm of unspecified site of left female breast: Secondary | ICD-10-CM

## 2015-01-05 DIAGNOSIS — C50412 Malignant neoplasm of upper-outer quadrant of left female breast: Secondary | ICD-10-CM

## 2015-01-05 DIAGNOSIS — C50419 Malignant neoplasm of upper-outer quadrant of unspecified female breast: Secondary | ICD-10-CM

## 2015-01-05 DIAGNOSIS — D3A098 Benign carcinoid tumors of other sites: Secondary | ICD-10-CM

## 2015-01-05 LAB — COMPREHENSIVE METABOLIC PANEL
ALK PHOS: 69 U/L (ref 39–117)
ALT: 21 U/L (ref 0–35)
AST: 15 U/L (ref 0–37)
Albumin: 3.9 g/dL (ref 3.5–5.2)
BUN: 18 mg/dL (ref 6–23)
CALCIUM: 9.8 mg/dL (ref 8.4–10.5)
CHLORIDE: 102 meq/L (ref 96–112)
CO2: 26 mEq/L (ref 19–32)
Creatinine, Ser: 0.97 mg/dL (ref 0.50–1.10)
GLUCOSE: 100 mg/dL — AB (ref 70–99)
Potassium: 3.6 mEq/L (ref 3.5–5.3)
Sodium: 139 mEq/L (ref 135–145)
TOTAL PROTEIN: 7.8 g/dL (ref 6.0–8.3)
Total Bilirubin: 0.5 mg/dL (ref 0.2–1.2)

## 2015-01-05 LAB — CBC WITH DIFFERENTIAL (CANCER CENTER ONLY)
BASO#: 0 10*3/uL (ref 0.0–0.2)
BASO%: 0.2 % (ref 0.0–2.0)
EOS ABS: 0.4 10*3/uL (ref 0.0–0.5)
EOS%: 4.8 % (ref 0.0–7.0)
HCT: 42.2 % (ref 34.8–46.6)
HGB: 13.4 g/dL (ref 11.6–15.9)
LYMPH#: 2.4 10*3/uL (ref 0.9–3.3)
LYMPH%: 26.1 % (ref 14.0–48.0)
MCH: 27.8 pg (ref 26.0–34.0)
MCHC: 31.8 g/dL — AB (ref 32.0–36.0)
MCV: 88 fL (ref 81–101)
MONO#: 0.8 10*3/uL (ref 0.1–0.9)
MONO%: 8.7 % (ref 0.0–13.0)
NEUT#: 5.6 10*3/uL (ref 1.5–6.5)
NEUT%: 60.2 % (ref 39.6–80.0)
PLATELETS: 319 10*3/uL (ref 145–400)
RBC: 4.82 10*6/uL (ref 3.70–5.32)
RDW: 15 % (ref 11.1–15.7)
WBC: 9.2 10*3/uL (ref 3.9–10.0)

## 2015-01-05 NOTE — Progress Notes (Signed)
Hematology and Oncology Follow Up Visit  Amber Bautista [id number] Sep 23, 1971 44 y.o. 01/05/2015   Principle Diagnosis:  1. Stage IIB (T2 N1 M0) ductal carcinoma of the left breast - triple negative  2. Metastatic carcinoid, liver mets  Current Therapy:    Somatuline $RemoveBef'120mg'osHGzBzEol$  sq q month     Interim History:  Amber Bautista is back for follow-up. He really has been quite a while since we last saw her. She, unfortunately, was lost to follow-up because of insurance issues. I last saw her a couple years ago. She had been found have a stage IIb ductal carcinoma of the left breast. She had one positive lymph node. She was treated with systemic chemotherapy with carboplatin/Taxotere. She then had radiation therapy.  She began to have some increased abdominal pain. She has a history of carcinoid. We've not treated this for a couple years. We did go ahead and get scans on her. This was done in early March. She had a 2.7 x 2.37 m lesion in the liver. Also noted was a adjacent 3 mm area of calcification. She had a 1.9 x 1.8; her nodule in the peritoneal reflection on the left abdomen. Also noted was a 1.4 similar lymph node above the body of the pancreas.  We then got an Octreoscan. This showed intense uptake in the liver. There was some slight uptake in the peritoneal nodule.  Of note, she had a mammogram done on March 9 which was negative.  History of having some abdominal pain. This is periumbilical. This is not constant. It just seems to be spasm like. It does not radiate. She may have some slight back discomfort.  Again, might be that this is from the carcinoid/neuroendocrine tumor.  Of note, her chromogranin A level went from 4.6 up to 98.  Of note, she has not had any testing for the breast cancer with respect to genetic indicators. I that she does qualify given that she had breast cancer when she was 44 years old. We did go ahead and send off the  [facility] analysis. She has 2 daughters so I is  important that we see if she is BRCA 1/2 nausea.  Her appetite has been decent. She's had no nausea or vomiting. She's had some diarrhea. She's had no leg swelling.  Overall, her performance status is ECOG 1.          Medications:  Current outpatient prescriptions:  .  ibuprofen (ADVIL,MOTRIN) 200 MG tablet, Take 200 mg by mouth every 6 (six) hours as needed., Disp: , Rfl:  .  metoprolol succinate (TOPROL-XL) 50 MG 24 hr tablet, Take 50 mg by mouth., Disp: , Rfl:  .  Multiple Vitamin (MULTIVITAMIN) tablet, Take 1 tablet by mouth daily., Disp: , Rfl:  .  traMADol (ULTRAM) 50 MG tablet, Take 1 tablet (50 mg total) by mouth every 6 (six) hours as needed for moderate pain., Disp: 120 tablet, Rfl: 0 .  vitamin B-12 (CYANOCOBALAMIN) 1000 MCG tablet, Take 1,000 mcg by mouth daily., Disp: , Rfl:   Allergies:  Allergies  Allergen Reactions  . Bactrim [Sulfamethoxazole-Trimethoprim] Hypertension    Swelling and hypertension after taking, was admitted to hospital    Past Medical History, Surgical history, Social history, and Family History were reviewed and updated.  Review of Systems: As above  Physical Exam:  height is $RemoveB'5\' 7"'DfmFkduc$  (1.702 m) and weight is 316 lb (143.337 kg). Her oral temperature is 98 F (36.7 C). Her blood pressure is 124/87 and her  pulse is 92. Her respiration is 16.   Wt Readings from Last 3 Encounters:  01/05/15 316 lb (143.337 kg)  12/07/14 322 lb (146.058 kg)  02/26/13 285 lb (129.275 kg)     Obese Hispanic female in no obvious distress. Head and neck exam shows no ocular or oral lesions. She has no palpable cervical or supraclavicular lymph nodes. Lungs are clear. Cardiac exam regular rate and rhythm with no murmurs, rubs or bruits. Abdomen is obese but soft. She has good bowel sounds. There is no fluid wave. There is no palpable liver or spleen tip. Back exam shows no tenderness over the spine, ribs or hips. Extremities shows no clubbing, cyanosis or edema. Skin  exam shows no rashes, ecchymoses or petechia. Neurological exam is nonfocal.  Lab Results  Component Value Date   WBC 9.2 01/05/2015   HGB 13.4 01/05/2015   HCT 42.2 01/05/2015   MCV 88 01/05/2015   PLT 319 01/05/2015     Chemistry      Component Value Date/Time   NA 140 12/07/2014 1057   NA 138 11/14/2012 0926   K 3.6 12/07/2014 1057   K 2.7* 11/14/2012 0926   CL 101 12/07/2014 1057   CL 96* 11/14/2012 0926   CO2 27 12/07/2014 1057   CO2 29 11/14/2012 0926   BUN 17 12/07/2014 1057   BUN 19 11/14/2012 0926   CREATININE 1.03 12/07/2014 1057   CREATININE 1.3* 11/14/2012 0926      Component Value Date/Time   CALCIUM 9.5 12/07/2014 1057   CALCIUM 9.5 11/14/2012 0926   ALKPHOS 67 12/07/2014 1057   ALKPHOS 61 11/14/2012 0926   AST 15 12/07/2014 1057   AST 21 11/14/2012 0926   ALT 18 12/07/2014 1057   ALT 20 11/14/2012 0926   BILITOT 0.5 12/07/2014 1057   BILITOT 0.60 11/14/2012 0926         Impression and Plan: [name] is 43 year old Hispanic female. She has metastatic carcinoid. By the elevated chromogranin a level, this is clearly active.  I will I do think that Somatuline which is the new octreotide analogs is a good idea for her. I that she would be able to tolerate this. I think that it would work. She has been on Sandostatin in the past so I this probably is not as usual because of resistance that has developed.  I don't think we are to do any intrahepatic therapy right now. I want to see how her symptoms improve and we will see what her chromogranin A level does.  I don't think we need any biopsies.  I spent about 45 minutes with her. I have not seen her for a couple years. She actually looks quite good.  We will plan to see her back in one month.   Volanda Napoleon, MD 3/29/201612:00 PM

## 2015-01-06 ENCOUNTER — Telehealth: Payer: Self-pay | Admitting: Hematology & Oncology

## 2015-01-06 NOTE — Telephone Encounter (Signed)
[place]: [phone] Status: Approved Dates: 01/06/2015 - 04/04/2015 CPT: [id number]

## 2015-01-08 LAB — CHROMOGRANIN A: Chromogranin A: 106 ng/mL — ABNORMAL HIGH (ref <=15)

## 2015-01-11 ENCOUNTER — Encounter: Payer: Self-pay | Admitting: Nurse Practitioner

## 2015-01-11 ENCOUNTER — Telehealth: Payer: Self-pay | Admitting: Hematology & Oncology

## 2015-01-11 NOTE — Telephone Encounter (Signed)
Pt aware of 4-5 inj

## 2015-01-12 ENCOUNTER — Telehealth: Payer: Self-pay | Admitting: Hematology & Oncology

## 2015-01-12 ENCOUNTER — Inpatient Hospital Stay: Payer: Medicare HMO

## 2015-01-12 NOTE — Telephone Encounter (Signed)
Pt moved 4-5 to 4-6 said she is sick

## 2015-01-13 ENCOUNTER — Inpatient Hospital Stay: Payer: Medicare HMO

## 2015-01-15 ENCOUNTER — Telehealth: Payer: Self-pay | Admitting: Hematology & Oncology

## 2015-01-15 NOTE — Telephone Encounter (Signed)
Pt called schedule 4-12 inj

## 2015-01-19 ENCOUNTER — Ambulatory Visit (HOSPITAL_BASED_OUTPATIENT_CLINIC_OR_DEPARTMENT_OTHER): Payer: Medicare HMO

## 2015-01-19 DIAGNOSIS — E340 Carcinoid syndrome: Secondary | ICD-10-CM

## 2015-01-19 DIAGNOSIS — C7A00 Malignant carcinoid tumor of unspecified site: Secondary | ICD-10-CM | POA: Diagnosis not present

## 2015-01-19 DIAGNOSIS — D3A098 Benign carcinoid tumors of other sites: Secondary | ICD-10-CM

## 2015-01-19 DIAGNOSIS — C7B02 Secondary carcinoid tumors of liver: Secondary | ICD-10-CM | POA: Diagnosis not present

## 2015-01-19 MED ORDER — LANREOTIDE ACETATE 120 MG/0.5ML ~~LOC~~ SOLN
120.0000 mg | Freq: Once | SUBCUTANEOUS | Status: AC
  Administered 2015-01-19: 120 mg via SUBCUTANEOUS
  Filled 2015-01-19: qty 120

## 2015-01-19 NOTE — Patient Instructions (Signed)
Lanreotide injection What is this medicine? LANREOTIDE (lan REE oh tide) is used to reduce blood levels of growth hormone in patients with a condition called acromegaly. It also works to slow or stop tumor growth in patients with gastroenteropancreatic neuroendocrine tumor (GEP-NET). This medicine may be used for other purposes; ask your health care provider or pharmacist if you have questions. COMMON BRAND NAME(S): Somatuline [place] What should I tell my health care provider before I take this medicine? They need to know if you have any of these conditions: -diabetes -gallbladder disease -heart disease -kidney disease -liver disease -an unusual or allergic reaction to lanreotide, other medicines, latex, foods, dyes, or preservatives -pregnant or trying to get pregnant -breast-feeding How should I use this medicine? This medicine is for injection under the skin. It is given by a health care professional in a hospital or clinic setting. Contact your pediatrician or health care professional regarding the use of this medicine in children. Special care may be needed. Overdosage: If you think you have taken too much of this medicine contact a poison control center or emergency room at once. NOTE: This medicine is only for you. Do not share this medicine with others. What if I miss a dose? It is important not to miss your dose. Call your doctor or health care professional if you are unable to keep an appointment. What may interact with this medicine? -bromocriptine -cyclosporine -medicines for diabetes, including insulin -medicines for heart disease or hypertension -quinidine This list may not describe all possible interactions. Give your health care provider a list of all the medicines, herbs, non-prescription drugs, or dietary supplements you use. Also tell them if you smoke, drink alcohol, or use illegal drugs. Some items may interact with your medicine. What should I watch for while using  this medicine? Visit your doctor or health care professional for regular checks on your progress. Your condition will be monitored carefully while you are receiving this medicine. This medicine may cause increases or decreases in blood sugar. Signs of high blood sugar include frequent urination, unusual thirst, flushed or dry skin, difficulty breathing, drowsiness, stomach ache, nausea, vomiting or dry mouth. Signs of low blood sugar include chills, cool, pale skin or cold sweats, drowsiness, extreme hunger, fast heartbeat, headache, nausea, nervousness or anxiety, shakiness, trembling, unsteadiness, tiredness, or weakness. Contact your doctor or health care professional right away if you experience any of these symptoms. What side effects may I notice from receiving this medicine? Side effects that you should report to your doctor or health care professional as soon as possible: -allergic reactions like skin rash, itching or hives, swelling of the face, lips, or tongue -changes in blood sugar -changes in heart rate -severe stomach pain Side effects that usually do not require medical attention (report to your doctor or health care professional if they continue or are bothersome): -diarrhea or constipation -gas or stomach pain -nausea, vomiting -pain, redness, swelling and irritation at site where injected This list may not describe all possible side effects. Call your doctor for medical advice about side effects. You may report side effects to FDA at 1-800-FDA-1088. Where should I keep my medicine? This drug is given in a hospital or clinic and will not be stored at home. NOTE: This sheet is a summary. It may not cover all possible information. If you have questions about this medicine, talk to your doctor, pharmacist, or health care provider.  2015, Elsevier/Gold Standard. (2013-09-24 17:43:04)

## 2015-01-20 ENCOUNTER — Telehealth: Payer: Self-pay | Admitting: *Deleted

## 2015-01-20 NOTE — Telephone Encounter (Signed)
Dr Marin Olp requested that I call patient with results from the My Risk Genetic Testing. Result was negative for any clinically significant mutation. Patient made aware of results. Appreciative of the information.

## 2015-01-21 ENCOUNTER — Encounter: Payer: Self-pay | Admitting: Hematology & Oncology

## 2015-02-02 ENCOUNTER — Ambulatory Visit: Payer: Medicare HMO | Admitting: Family

## 2015-02-02 ENCOUNTER — Other Ambulatory Visit: Payer: Medicare HMO

## 2015-02-02 ENCOUNTER — Ambulatory Visit: Payer: Medicare HMO

## 2015-02-08 ENCOUNTER — Ambulatory Visit: Payer: Medicare HMO | Admitting: Family

## 2015-02-08 ENCOUNTER — Other Ambulatory Visit: Payer: Medicare HMO

## 2015-02-08 ENCOUNTER — Ambulatory Visit: Payer: Medicare HMO

## 2015-02-16 ENCOUNTER — Other Ambulatory Visit: Payer: Self-pay | Admitting: *Deleted

## 2015-02-16 ENCOUNTER — Other Ambulatory Visit (HOSPITAL_BASED_OUTPATIENT_CLINIC_OR_DEPARTMENT_OTHER): Payer: Medicare HMO

## 2015-02-16 ENCOUNTER — Ambulatory Visit (HOSPITAL_BASED_OUTPATIENT_CLINIC_OR_DEPARTMENT_OTHER): Payer: Medicare HMO | Admitting: Family

## 2015-02-16 ENCOUNTER — Ambulatory Visit (HOSPITAL_BASED_OUTPATIENT_CLINIC_OR_DEPARTMENT_OTHER): Payer: Medicare HMO

## 2015-02-16 ENCOUNTER — Encounter: Payer: Self-pay | Admitting: Family

## 2015-02-16 VITALS — BP 144/70 | HR 65 | Temp 98.1°F | Resp 16 | Ht 65.0 in | Wt 310.0 lb

## 2015-02-16 DIAGNOSIS — C7B02 Secondary carcinoid tumors of liver: Secondary | ICD-10-CM

## 2015-02-16 DIAGNOSIS — E340 Carcinoid syndrome: Secondary | ICD-10-CM

## 2015-02-16 DIAGNOSIS — D3A098 Benign carcinoid tumors of other sites: Secondary | ICD-10-CM

## 2015-02-16 DIAGNOSIS — C7A00 Malignant carcinoid tumor of unspecified site: Secondary | ICD-10-CM

## 2015-02-16 DIAGNOSIS — C50912 Malignant neoplasm of unspecified site of left female breast: Secondary | ICD-10-CM

## 2015-02-16 DIAGNOSIS — C50919 Malignant neoplasm of unspecified site of unspecified female breast: Secondary | ICD-10-CM

## 2015-02-16 LAB — CBC WITH DIFFERENTIAL (CANCER CENTER ONLY)
BASO#: 0 10*3/uL (ref 0.0–0.2)
BASO%: 0.2 % (ref 0.0–2.0)
EOS ABS: 0.3 10*3/uL (ref 0.0–0.5)
EOS%: 3.8 % (ref 0.0–7.0)
HEMATOCRIT: 41.3 % (ref 34.8–46.6)
HEMOGLOBIN: 13.2 g/dL (ref 11.6–15.9)
LYMPH#: 2.1 10*3/uL (ref 0.9–3.3)
LYMPH%: 24.2 % (ref 14.0–48.0)
MCH: 28 pg (ref 26.0–34.0)
MCHC: 32 g/dL (ref 32.0–36.0)
MCV: 88 fL (ref 81–101)
MONO#: 0.7 10*3/uL (ref 0.1–0.9)
MONO%: 7.8 % (ref 0.0–13.0)
NEUT#: 5.5 10*3/uL (ref 1.5–6.5)
NEUT%: 64 % (ref 39.6–80.0)
Platelets: 250 10*3/uL (ref 145–400)
RBC: 4.71 10*6/uL (ref 3.70–5.32)
RDW: 15.7 % (ref 11.1–15.7)
WBC: 8.6 10*3/uL (ref 3.9–10.0)

## 2015-02-16 MED ORDER — LANREOTIDE ACETATE 120 MG/0.5ML ~~LOC~~ SOLN
120.0000 mg | Freq: Once | SUBCUTANEOUS | Status: AC
  Administered 2015-02-16: 120 mg via SUBCUTANEOUS
  Filled 2015-02-16: qty 120

## 2015-02-16 MED ORDER — HEPARIN SOD (PORK) LOCK FLUSH 100 UNIT/ML IV SOLN
500.0000 [IU] | Freq: Once | INTRAVENOUS | Status: AC
  Administered 2015-02-16: 500 [IU] via INTRAVENOUS
  Filled 2015-02-16: qty 5

## 2015-02-16 MED ORDER — SODIUM CHLORIDE 0.9 % IJ SOLN
10.0000 mL | INTRAMUSCULAR | Status: DC | PRN
  Administered 2015-02-16: 10 mL via INTRAVENOUS
  Filled 2015-02-16: qty 10

## 2015-02-16 NOTE — Progress Notes (Signed)
Hematology and Oncology Follow Up Visit  Amber Bautista [id number] 01-28-1971 44 y.o. 44 y.o. 02/16/2015   Principle Diagnosis:  1. History of stage IIB (T2 N1 M0) ductal carcinoma of the left breast - triple negative  2. Metastatic carcinoid, liver mets  Current Therapy:   Somatuline $RemoveBef'120mg'vLsJHmIjah$  sq q month    Interim History:  Amber Bautista is here today for a follow-up. We are seeing her now for metastatic carcinoid with mets to the liver. Today was her 2nd Somatuline injection. She does look better today and seems to be in much better spirits.  She is still having some some epigastric pain. She has several BM's a few days a week (this is not a new issues for her. She is going to try some Pepto bismol for this. She denies constipation or diarrhea. No blood in her stool. She is taking Ultram for the epigastric pain and this has been effective.  She has changed her eating habits and started exercising in an attempt to be more healthy and lose weight. She is down 6 lbs since her last visit and is very happy with this. Her appetite is good and she is staying hydrated.  She also c/o having some lower back discomfort for 2 days after receiving her Somatuline injection but none since. Per up to date: Arthralgias and musculoskeletal pain are common side effects found in around 10% of patients taking Somatuline.  She is moving back to [place] with her daughters on the 31st of this month. She would like to have her port a cath removed by Dr. Dalbert Batman (who put it in) before she leaves. We spoke with his scheduling nurse and she is going to arrange this for her and call her with a date and time. I told Amber Bautista to notify us if she has not heard from their office within the next day or so and we will follow-up with them on this and make sure it is completed since we are on a time constraint. She plans to get established with a new PCP as soon as she moves and have them refer her back to the oncologist she was with previously on  [facility] I emphasized the importance of her staying on top of her oncologic care and to get re-established as soon as possible. She will let us know where to have her records sent. She denies having any fever, chills, n/v, cough, rash, dizziness, headache, blurred vision, chest pain, palpitations,  constipation, diarrhea, blood in urine or stool.  No episodes of bleeding or bruising.  No swelling, tenderness, numbness or tingling in her extremities. No new aches or pains.  In March, her Chromogranin A was elevated at 106. Her My Risk BRCA testing was negative.  She did have a mammogram in March and states that it was "normal." Prior to returning to [place] several months ago she he was followed by Dr. Marin Olp until 2014 when she decided to stop treatment due to no insurance and financial constraints. These issues seem to have resolved.  Treatment for her carcinoma of the left breast included a lumpectomy. She had 1 positive lymph node. She then received 4 cycles of Carboplatin/Taxotere and 3 weeks of radiation.  In 2001, she was treated with intrahepatic therapy for the carcinoid at [facility]. Her carcinoid tumor was removed that same year.   Medications:    Medication List       This list is accurate as of: 02/16/15 11:51 AM.  Always use your  most recent med list.               ibuprofen 200 MG tablet  Commonly known as:  ADVIL,MOTRIN  Take 200 mg by mouth every 6 (six) hours as needed.     metoprolol succinate 50 MG 24 hr tablet  Commonly known as:  TOPROL-XL  Take 50 mg by mouth.     multivitamin tablet  Take 1 tablet by mouth daily.     traMADol 50 MG tablet  Commonly known as:  ULTRAM  Take 1 tablet (50 mg total) by mouth every 6 (six) hours as needed for moderate pain.     vitamin B-12 1000 MCG tablet  Commonly known as:  CYANOCOBALAMIN  Take 1,000 mcg by mouth daily.        Allergies:  Allergies  Allergen Reactions  . Bactrim [Sulfamethoxazole-Trimethoprim]  Hypertension    Swelling and hypertension after taking, was admitted to hospital    Past Medical History, Surgical history, Social history, and Family History were reviewed and updated.  Review of Systems: All other 10 point review of systems is negative.   Physical Exam:  vitals were not taken for this visit.  Wt Readings from Last 3 Encounters:  01/05/15 316 lb (143.337 kg)  12/07/14 322 lb (146.058 kg)  02/26/13 285 lb (129.275 kg)    Ocular: Sclerae unicteric, pupils equal, round and reactive to light Ear-nose-throat: Oropharynx clear, dentition fair Lymphatic: No cervical or supraclavicular adenopathy Lungs no rales or rhonchi, good excursion bilaterally Heart regular rate and rhythm, no murmur appreciated Abd soft, nontender, positive bowel sounds MSK no focal spinal tenderness, no joint edema Neuro: non-focal, well-oriented, appropriate affect Breasts: No changes, no lymphadenopathy  Lab Results  Component Value Date   WBC 9.2 01/05/2015   HGB 13.4 01/05/2015   HCT 42.2 01/05/2015   MCV 88 01/05/2015   PLT 319 01/05/2015   No results found for: FERRITIN, IRON, TIBC, UIBC, IRONPCTSAT Lab Results  Component Value Date   RBC 4.82 01/05/2015   No results found for: KPAFRELGTCHN, LAMBDASER, KAPLAMBRATIO No results found for: IGGSERUM, IGA, IGMSERUM No results found for: Odetta Pink, SPEI   Chemistry      Component Value Date/Time   NA 139 01/05/2015 0939   NA 138 11/14/2012 0926   K 3.6 01/05/2015 0939   K 2.7* 11/14/2012 0926   CL 102 01/05/2015 0939   CL 96* 11/14/2012 0926   CO2 26 01/05/2015 0939   CO2 29 11/14/2012 0926   BUN 18 01/05/2015 0939   BUN 19 11/14/2012 0926   CREATININE 0.97 01/05/2015 0939   CREATININE 1.3* 11/14/2012 0926      Component Value Date/Time   CALCIUM 9.8 01/05/2015 0939   CALCIUM 9.5 11/14/2012 0926   ALKPHOS 69 01/05/2015 0939   ALKPHOS 61 11/14/2012 0926   AST 15  01/05/2015 0939   AST 21 11/14/2012 0926   ALT 21 01/05/2015 0939   ALT 20 11/14/2012 0926   BILITOT 0.5 01/05/2015 0939   BILITOT 0.60 11/14/2012 0926     Impression and Plan: Amber Bautista is 44 year old 44 year old Hispanic female with metastatic carcinoid. Her last chromogranin A level was elevated at 106 indicating that this is active. We will see what her level is for today.  She is still having some epigastric pain that is effectively alleviated with Ultram.  She will receive her second dose of Somatuline today. She notified us today that she is moving back to  [place] at the end of this month.  We have arranged for Dr. Dalbert Batman to remove her port a cath before she leaves.  She promises to re-establish care with her previous oncologist on [facility] when she gets there. She will let us know where to send her records.   We wish her the best of luck and she knows to contact us with any questions or concerns. We will gladly see her again if she happens to move back to this area in the future.   Eliezer Bottom, NP 5/10/201611:51 AM

## 2015-02-17 LAB — IRON AND TIBC CHCC
%SAT: 13 % — ABNORMAL LOW (ref 21–57)
Iron: 46 ug/dL (ref 41–142)
TIBC: 358 ug/dL (ref 236–444)
UIBC: 311 ug/dL (ref 120–384)

## 2015-02-17 LAB — FERRITIN CHCC: FERRITIN: 49 ng/mL (ref 9–269)

## 2015-02-20 LAB — COMPREHENSIVE METABOLIC PANEL
ALBUMIN: 3.7 g/dL (ref 3.5–5.2)
ALK PHOS: 72 U/L (ref 39–117)
ALT: 56 U/L — ABNORMAL HIGH (ref 0–35)
AST: 26 U/L (ref 0–37)
BILIRUBIN TOTAL: 0.7 mg/dL (ref 0.2–1.2)
BUN: 18 mg/dL (ref 6–23)
CO2: 25 mEq/L (ref 19–32)
Calcium: 9 mg/dL (ref 8.4–10.5)
Chloride: 99 mEq/L (ref 96–112)
Creatinine, Ser: 1.04 mg/dL (ref 0.50–1.10)
Glucose, Bld: 106 mg/dL — ABNORMAL HIGH (ref 70–99)
Potassium: 3.5 mEq/L (ref 3.5–5.3)
SODIUM: 138 meq/L (ref 135–145)
TOTAL PROTEIN: 7.1 g/dL (ref 6.0–8.3)

## 2015-02-20 LAB — CHROMOGRANIN A: Chromogranin A: 70 ng/mL — ABNORMAL HIGH (ref <=15)

## 2015-02-21 ENCOUNTER — Other Ambulatory Visit: Payer: Self-pay | Admitting: General Surgery

## 2015-03-03 DIAGNOSIS — C801 Malignant (primary) neoplasm, unspecified: Secondary | ICD-10-CM

## 2015-03-03 HISTORY — DX: Malignant (primary) neoplasm, unspecified: C80.1

## 2015-03-10 ENCOUNTER — Encounter (HOSPITAL_BASED_OUTPATIENT_CLINIC_OR_DEPARTMENT_OTHER): Admission: RE | Payer: Self-pay | Source: Ambulatory Visit

## 2015-03-10 ENCOUNTER — Ambulatory Visit (HOSPITAL_BASED_OUTPATIENT_CLINIC_OR_DEPARTMENT_OTHER): Admission: RE | Admit: 2015-03-10 | Payer: Medicare HMO | Source: Ambulatory Visit | Admitting: General Surgery

## 2015-03-10 SURGERY — REMOVAL PORT-A-CATH
Anesthesia: General

## 2017-10-08 ENCOUNTER — Encounter: Payer: Self-pay | Admitting: Hematology & Oncology

## 2017-10-08 ENCOUNTER — Telehealth: Payer: Self-pay | Admitting: Hematology & Oncology

## 2017-10-08 NOTE — Telephone Encounter (Signed)
MEDICAL RECORDS REQUEST  FAXED RECORDS TO:  [facility] [address] [place] [address] [facility] [phone] [facility] [phone]

## 2018-11-25 ENCOUNTER — Telehealth: Payer: Self-pay | Admitting: Hematology & Oncology

## 2018-11-25 NOTE — Telephone Encounter (Signed)
Faxed medical records to: [facility]   P: [phone] F: [phone] F: Corry SCANNED

## 2019-03-17 ENCOUNTER — Encounter: Payer: Self-pay | Admitting: Family Medicine

## 2019-03-17 ENCOUNTER — Other Ambulatory Visit: Payer: Self-pay

## 2019-03-17 ENCOUNTER — Ambulatory Visit (INDEPENDENT_AMBULATORY_CARE_PROVIDER_SITE_OTHER): Payer: Medicare HMO | Admitting: Family Medicine

## 2019-03-17 VITALS — Ht 66.0 in | Wt 244.0 lb

## 2019-03-17 DIAGNOSIS — I509 Heart failure, unspecified: Secondary | ICD-10-CM

## 2019-03-17 DIAGNOSIS — C50912 Malignant neoplasm of unspecified site of left female breast: Secondary | ICD-10-CM

## 2019-03-17 DIAGNOSIS — D3A098 Benign carcinoid tumors of other sites: Secondary | ICD-10-CM | POA: Diagnosis not present

## 2019-03-17 DIAGNOSIS — I5022 Chronic systolic (congestive) heart failure: Secondary | ICD-10-CM | POA: Insufficient documentation

## 2019-03-17 HISTORY — DX: Heart failure, unspecified: I50.9

## 2019-03-17 NOTE — Progress Notes (Signed)
Chief Complaint  Patient presents with  . New Patient (Initial Visit)    referral to cardiology       New Patient Visit SUBJECTIVE: HPI: Amber Bautista is an 48 y.o.female who is being seen for establishing care.  The patient was previously seen in [place]. Due to COVID-19 pandemic, we are interacting via web portal for an electronic face-to-face visit. I verified patient's ID using 2 identifiers. Patient agreed to proceed with visit via this method. Patient is at home, I am at office. Patient and I are present for visit.   Pt has a hx of a "weak heart" for which she used to see cardio team in [place] for. She had a heart cath prior to leaving that did not show significant blockage per her memory. She has a hx of breast cancer, currently in remission, and it is believed that her chemotherapy caused her current situation. She is unsure if she has CHF for sure, but there was discussion of a defibrillator being placed and she was told to mind her salt intake at all times. Her diet is healthy, she tries to stay active. Would have dyspnea on exertion prior to seeing the cardiology team and also have fatigue.   Hx of carcinoid tumor in addition to breast cancer. She used to see Dr Marin Olp and would like to see him again.   Allergies  Allergen Reactions  . Bactrim [Sulfamethoxazole-Trimethoprim] Hypertension    Swelling and hypertension after taking, was admitted to hospital    Past Medical History:  Diagnosis Date  . Breast cancer ([place]) 07/12/12   Stage IIB Ductal Carcinoma to  Upper Outer Quadrant: Left Breast: Triple Negative  . Carcinoid tumor of intestine 2001   [place] 2007  . Carcinoma of breast treated with adjuvant chemotherapy ([place])    4 cycles of Carboplatin / Taxol  . Diarrhea    Sandostatin  . Hypertension   . Morbidly obese ([place])   . S/P radiation therapy 02/04/2013-02/21/2013   Left Breast and Axilla / 46.8 Gy in 26 fractions were planned (she only received 18 Gy in 10 fractions)   Past  Surgical History:  Procedure Laterality Date  . BREAST SURGERY  08/23/12   Lt br lumpectomy  . [facility] 2007  . COLON SURGERY  2001   Cancer/ Intestinal Resection  . OVARIAN CYST REMOVAL  2001  . PARTIAL MASTECTOMY WITH NEEDLE LOCALIZATION AND AXILLARY SENTINEL LYMPH NODE BX  08/23/2012   Procedure: PARTIAL MASTECTOMY WITH NEEDLE LOCALIZATION AND AXILLARY SENTINEL LYMPH NODE BX;  Surgeon: Adin Hector, MD;  Location: [facility];  Service: General;  Laterality: Left;  blue dye injection   . PORTACATH PLACEMENT  08/23/2012   Procedure: INSERTION PORT-A-CATH;  Surgeon: Adin Hector, MD;  Location: [facility];  Service: General;  Laterality: Right;  intraop ultrasound   Family History  Problem Relation Age of Onset  . Heart attack Father   . Diabetes Father   . Hypertension Mother   . Diabetes Mother   . Glaucoma Mother   . Hypertension Brother   . Asthma Brother   . Cancer Paternal Grandmother    Allergies  Allergen Reactions  . Bactrim [Sulfamethoxazole-Trimethoprim] Hypertension    Swelling and hypertension after taking, was admitted to hospital    Current Outpatient Medications:  .  metoprolol succinate (TOPROL-XL) 50 MG 24 hr tablet, Take 50 mg by mouth., Disp: , Rfl:  .  valsartan (DIOVAN) 80 MG tablet, Take 80 mg by  mouth daily., Disp: , Rfl:  .  nitroGLYCERIN (NITROSTAT) 0.4 MG SL tablet, Place 0.4 mg under the tongue every 5 (five) minutes as needed for chest pain., Disp: , Rfl:   Patient's last menstrual period was 08/18/2014.  ROS Cardiovascular: Denies chest pain  Respiratory: Denies dyspnea   OBJECTIVE: Ht 5\' 6"  (1.676 m)   Wt 244 lb (110.7 kg)   LMP 08/18/2014 Comment: menopausal: chemo induced  BMI 39.38 kg/m  No conversational dyspnea Age appropriate judgment and insight Nml affect and mood  ASSESSMENT/PLAN: Chronic congestive heart failure, unspecified heart failure type ([place]) - Plan: Ambulatory referral to Cardiology  Carcinoid tumor of  intestine - Plan: Ambulatory referral to Oncology  Breast cancer, left [facility])  Patient instructed to sign release of records form from her previous PCP.  Respective referrals placed. She has enough refills for next 2.5 mo, will let us know if she needs more prior to getting in w cardio team.  Patient should return in 10 mo for CPE. The patient voiced understanding and agreement to the plan.   [place], DO 03/17/19  2:24 PM

## 2019-03-26 DIAGNOSIS — H52209 Unspecified astigmatism, unspecified eye: Secondary | ICD-10-CM | POA: Diagnosis not present

## 2019-03-26 DIAGNOSIS — H5213 Myopia, bilateral: Secondary | ICD-10-CM | POA: Diagnosis not present

## 2019-03-26 DIAGNOSIS — H524 Presbyopia: Secondary | ICD-10-CM | POA: Diagnosis not present

## 2019-03-31 ENCOUNTER — Encounter: Payer: Self-pay | Admitting: Hematology & Oncology

## 2019-03-31 ENCOUNTER — Telehealth: Payer: Self-pay | Admitting: *Deleted

## 2019-03-31 ENCOUNTER — Inpatient Hospital Stay: Payer: Medicare HMO

## 2019-03-31 ENCOUNTER — Other Ambulatory Visit: Payer: Self-pay

## 2019-03-31 ENCOUNTER — Inpatient Hospital Stay: Payer: Medicare HMO | Attending: Hematology & Oncology | Admitting: Hematology & Oncology

## 2019-03-31 VITALS — BP 113/57 | HR 45 | Temp 98.6°F | Resp 18 | Wt 248.0 lb

## 2019-03-31 DIAGNOSIS — I10 Essential (primary) hypertension: Secondary | ICD-10-CM | POA: Diagnosis not present

## 2019-03-31 DIAGNOSIS — Z171 Estrogen receptor negative status [ER-]: Secondary | ICD-10-CM | POA: Insufficient documentation

## 2019-03-31 DIAGNOSIS — Z923 Personal history of irradiation: Secondary | ICD-10-CM | POA: Insufficient documentation

## 2019-03-31 DIAGNOSIS — C7A019 Malignant carcinoid tumor of the small intestine, unspecified portion: Secondary | ICD-10-CM | POA: Diagnosis not present

## 2019-03-31 DIAGNOSIS — Z853 Personal history of malignant neoplasm of breast: Secondary | ICD-10-CM | POA: Insufficient documentation

## 2019-03-31 DIAGNOSIS — C50411 Malignant neoplasm of upper-outer quadrant of right female breast: Secondary | ICD-10-CM

## 2019-03-31 DIAGNOSIS — C7B02 Secondary carcinoid tumors of liver: Secondary | ICD-10-CM | POA: Insufficient documentation

## 2019-03-31 DIAGNOSIS — Z79899 Other long term (current) drug therapy: Secondary | ICD-10-CM | POA: Insufficient documentation

## 2019-03-31 DIAGNOSIS — R197 Diarrhea, unspecified: Secondary | ICD-10-CM | POA: Insufficient documentation

## 2019-03-31 DIAGNOSIS — D3A098 Benign carcinoid tumors of other sites: Secondary | ICD-10-CM

## 2019-03-31 DIAGNOSIS — R008 Other abnormalities of heart beat: Secondary | ICD-10-CM | POA: Diagnosis not present

## 2019-03-31 DIAGNOSIS — R6889 Other general symptoms and signs: Secondary | ICD-10-CM | POA: Diagnosis not present

## 2019-03-31 DIAGNOSIS — Z7189 Other specified counseling: Secondary | ICD-10-CM | POA: Insufficient documentation

## 2019-03-31 HISTORY — DX: Other specified counseling: Z71.89

## 2019-03-31 LAB — CBC WITH DIFFERENTIAL (CANCER CENTER ONLY)
Abs Immature Granulocytes: 0.03 10*3/uL (ref 0.00–0.07)
Basophils Absolute: 0.1 10*3/uL (ref 0.0–0.1)
Basophils Relative: 1 %
Eosinophils Absolute: 0.2 10*3/uL (ref 0.0–0.5)
Eosinophils Relative: 2 %
HCT: 38.2 % (ref 36.0–46.0)
Hemoglobin: 11.4 g/dL — ABNORMAL LOW (ref 12.0–15.0)
Immature Granulocytes: 0 %
Lymphocytes Relative: 31 %
Lymphs Abs: 2.8 10*3/uL (ref 0.7–4.0)
MCH: 25.2 pg — ABNORMAL LOW (ref 26.0–34.0)
MCHC: 29.8 g/dL — ABNORMAL LOW (ref 30.0–36.0)
MCV: 84.3 fL (ref 80.0–100.0)
Monocytes Absolute: 0.7 10*3/uL (ref 0.1–1.0)
Monocytes Relative: 8 %
Neutro Abs: 5.3 10*3/uL (ref 1.7–7.7)
Neutrophils Relative %: 58 %
Platelet Count: 411 10*3/uL — ABNORMAL HIGH (ref 150–400)
RBC: 4.53 MIL/uL (ref 3.87–5.11)
RDW: 15.1 % (ref 11.5–15.5)
WBC Count: 9.1 10*3/uL (ref 4.0–10.5)
nRBC: 0 % (ref 0.0–0.2)

## 2019-03-31 LAB — CMP (CANCER CENTER ONLY)
ALT: 15 U/L (ref 0–44)
AST: 13 U/L — ABNORMAL LOW (ref 15–41)
Albumin: 3.6 g/dL (ref 3.5–5.0)
Alkaline Phosphatase: 76 U/L (ref 38–126)
Anion gap: 11 (ref 5–15)
BUN: 17 mg/dL (ref 6–20)
CO2: 23 mmol/L (ref 22–32)
Calcium: 9.3 mg/dL (ref 8.9–10.3)
Chloride: 106 mmol/L (ref 98–111)
Creatinine: 1.11 mg/dL — ABNORMAL HIGH (ref 0.44–1.00)
GFR, Est AFR Am: >60 mL/min (ref >60)
GFR, Estimated: 58 mL/min — ABNORMAL LOW (ref >60)
Glucose, Bld: 92 mg/dL (ref 70–99)
Potassium: 3.3 mmol/L — ABNORMAL LOW (ref 3.5–5.1)
Sodium: 140 mmol/L (ref 135–145)
Total Bilirubin: 0.7 mg/dL (ref 0.3–1.2)
Total Protein: 6.8 g/dL (ref 6.5–8.1)

## 2019-03-31 NOTE — Telephone Encounter (Signed)
attempt to reach pt multiple times on all #s provided. No vm Call to pt mother Amber Bautista lmovm for pt and or mother to call office to confirm a working number for pt as Cardiology clinic will be calling her to schedule an appt for tomorrow 6/23 for further evaluation.

## 2019-03-31 NOTE — Progress Notes (Signed)
Referral MD  Reason for Referral: Carcinoid of the small intestine; Stage IIB (T2N1M0) -- triple negative - carcinoma of the LEFT breast  Chief Complaint  Patient presents with  . Follow-up  : I finally moved back to [place].  HPI: Amber Bautista is well-known to [place].  We last saw her back in 2016.  She is a very nice 48 year old Hispanic female.  We had been seeing her, she had metastatic carcinoid with liver metastasis.  She also had a history of stage IIb ductal carcinoma of the left breast.  She received adjuvant hemotherapy for this with 4 cycles of carboplatinum/Taxotere.  She also had radiation therapy.  I think this is all completed then 2013.  She subsequently moved up to [place].  She has family up in [place].  She was at [facility].  She was living up there.  Unfortunately, she began to develop symptoms of congestive heart failure.  She has seen a cardiologist.  She was told that she had a very weak heart.  She had a moved back to [place].  She just feels better back in [place].  She moved back here in April.  She is followed by Dr. Nani Ravens, with our internal medicine.  He made the referral for [place] to see her again.  Clearly, I think her issue now is the cardiac status.  I did do an EKG on her today.  She has trigeminy and bigeminy.  I went to cardiology across the hall from our office.  I spoke with Dr. Bettina Gavia.  He was very impressed by the EKG.  He says that he will get her in to be seen tomorrow.  We are trying to get hold of her to let her know that she is going to be seen by cardiology quickly.  She is having no issues with chest pain.  She does get tired but easily.  There is been no leg swelling.  She has been having diarrhea.  She has not had Somatuline for 4 years.  Apparently she could not afford it.  We will have to see about getting this for her.  I am not sure when her last scans were.  I am sure that she will need scans.  She is had no cough.   There is been no shortness of breath.  She has had no bleeding.  There is been no rashes.  Overall, her performance status is ECOG 1.     Past Medical History:  Diagnosis Date  . Breast cancer ([place]) 07/12/12   Stage IIB Ductal Carcinoma to  Upper Outer Quadrant: Left Breast: Triple Negative  . Carcinoid tumor of intestine 2001   Mets 2007  . Carcinoma of breast treated with adjuvant chemotherapy ([place])    4 cycles of Carboplatin / Taxol  . Chronic congestive heart failure ([place]) 03/17/2019  . Diarrhea    Sandostatin  . Hypertension   . Morbidly obese ([place])   . S/P radiation therapy 02/04/2013-02/21/2013   Left Breast and Axilla / 46.8 Gy in 26 fractions were planned (she only received 18 Gy in 10 fractions)  :  Past Surgical History:  Procedure Laterality Date  . BREAST SURGERY  08/23/12   Lt br lumpectomy  . [place] 2007  . COLON SURGERY  2001   Cancer/ Intestinal Resection  . OVARIAN CYST REMOVAL  2001  . PARTIAL MASTECTOMY WITH NEEDLE LOCALIZATION AND AXILLARY SENTINEL LYMPH NODE BX  08/23/2012   Procedure:  PARTIAL MASTECTOMY WITH NEEDLE LOCALIZATION AND AXILLARY SENTINEL LYMPH NODE BX;  Surgeon: Adin Hector, MD;  Location: [facility];  Service: General;  Laterality: Left;  blue dye injection   . PORTACATH PLACEMENT  08/23/2012   Procedure: INSERTION PORT-A-CATH;  Surgeon: Adin Hector, MD;  Location: [facility];  Service: General;  Laterality: Right;  intraop ultrasound  :   Current Outpatient Medications:  .  metoprolol succinate (TOPROL-XL) 50 MG 24 hr tablet, Take 50 mg by mouth., Disp: , Rfl:  .  nitroGLYCERIN (NITROSTAT) 0.4 MG SL tablet, Place 0.4 mg under the tongue every 5 (five) minutes as needed for chest pain., Disp: , Rfl:  .  valsartan (DIOVAN) 80 MG tablet, Take 80 mg by mouth daily., Disp: , Rfl: :  :  Allergies  Allergen Reactions  . Sulfamethoxazole-Trimethoprim Hypertension and Swelling    Swelling and hypertension after taking, was  admitted to hospital  :  Family History  Problem Relation Age of Onset  . Heart attack Father   . Diabetes Father   . Hypertension Mother   . Diabetes Mother   . Glaucoma Mother   . Hypertension Brother   . Asthma Brother   . Cancer Paternal Grandmother   :  Social History   Socioeconomic History  . Marital status: Divorced    Spouse name: Not on file  . Number of children: Not on file  . Years of education: Not on file  . Highest education level: Not on file  Occupational History  . Not on file  Social Needs  . Financial resource strain: Not on file  . Food insecurity    Worry: Not on file    Inability: Not on file  . Transportation needs    Medical: Not on file    Non-medical: Not on file  Tobacco Use  . Smoking status: Never Smoker  . Smokeless tobacco: Never Used  . Tobacco comment: NEVER SMOKED  Substance and Sexual Activity  . Alcohol use: No    Alcohol/week: 0.0 standard drinks  . Drug use: No  . Sexual activity: Not Currently    Comment: menarche age 3, P 3, G 1, 1 misscarriage  Lifestyle  . Physical activity    Days per week: Not on file    Minutes per session: Not on file  . Stress: Not on file  Relationships  . Social Herbalist on phone: Not on file    Gets together: Not on file    Attends religious service: Not on file    Active member of club or organization: Not on file    Attends meetings of clubs or organizations: Not on file    Relationship status: Not on file  . Intimate partner violence    Fear of current or ex partner: Not on file    Emotionally abused: Not on file    Physically abused: Not on file    Forced sexual activity: Not on file  Other Topics Concern  . Not on file  Social History Narrative  . Not on file  :  Review of Systems  Constitutional: Positive for weight loss.  HENT: Negative.   Eyes: Negative.   Respiratory: Positive for shortness of breath.   Cardiovascular: Positive for palpitations.   Gastrointestinal: Positive for diarrhea.  Genitourinary: Negative.   Musculoskeletal: Negative.   Skin: Negative.   Neurological: Negative.   Endo/Heme/Allergies: Negative.   Psychiatric/Behavioral: Negative.      Exam: Obese Hispanic female  in no obvious distress.  Vital signs are temperature of 98.6.  Pulse 45.  Blood pressure 113/57.  Weight is 248 pounds.  Head neck exam shows no ocular or oral lesions.  She has no palpable cervical or supraclavicular lymph nodes.  There is no scleral icterus.  Lungs are clear bilaterally.  Cardiac exam regular rate and rhythm with extra beats.  The extremities appear to be regular.  Abdomen is soft but obese.  She has no fluid wave.  There is no abdominal mass.  She has good bowel sounds.  There is no guarding or rebound tenderness.  There is no palpable liver or spleen tip.  Breast exam shows right breast no masses, edema or erythema.  There is no right axillary adenopathy.  Left breast shows some changes of radiation.  She has a lumpectomy scar at about the 4 o'clock position.  There is some firmness of the lumpectomy site.  She has a well-healed left axillary lymphadenectomy scar.  Back exam shows no tenderness over the spine, ribs or hips.  Extremities shows no clubbing, cyanosis or edema.  There is no lymphedema of the left arm.  Skin exam shows no rashes, edema or erythema.  Neurological exam shows no focal neurological deficits.  @IPVITALS @   Recent Labs    03/31/19 1339  WBC 9.1  HGB 11.4*  HCT 38.2  PLT 411*   Recent Labs    03/31/19 1339  NA 140  K 3.3*  CL 106  CO2 23  GLUCOSE 92  BUN 17  CREATININE 1.11*  CALCIUM 9.3    Blood smear review: None  Pathology: None    Assessment and Plan: Mr. Shadowens is a 48 year old Hispanic female.  We saw her 4 years ago.  She now has a big issue with regard to her cardiac function.  It will be very interesting to see what her cardiac status actually is.  Again, she did not get any  cardiotoxic drugs for her breast cancer.  She did get radiation therapy.  Clearly, I think her heart will dictate her prognosis.  I know she is only 48 years old.  Hopefully, cardiologist can help her out and improve her cardiac status.  We will have to see about doing MRI or CT scan of her abdomen so we see where things stand with her carcinoid tumor.  We will see what her chromogranin A level is.  Hopefully, we can see getting this a match line for her.  I do not think breast cancer is a problem.  She has triple negative disease.  She is now 6 years from treatment so I would like to hope that her cancer recurrence is going to be less than 10%.  This is more complicated than I would I thought.  Her cardiac status definitely is a problem.  We will have to see how that can be sorted out.  I am sure cardiology will do a very good job with her.  We will have to get her back to see [place] probably within a month.  Again, we have to get our scans set up.  We spent about an hour with her today.  It was nice to see her again.  She is lost a ton of weight since we last saw her.  This, has to be helpful for her.

## 2019-04-01 DIAGNOSIS — I493 Ventricular premature depolarization: Secondary | ICD-10-CM | POA: Insufficient documentation

## 2019-04-01 HISTORY — DX: Ventricular premature depolarization: I49.3

## 2019-04-01 LAB — IRON AND TIBC
Iron: 21 ug/dL — ABNORMAL LOW (ref 41–142)
Saturation Ratios: 5 % — ABNORMAL LOW (ref 21–57)
TIBC: 433 ug/dL (ref 236–444)
UIBC: 411 ug/dL — ABNORMAL HIGH (ref 120–384)

## 2019-04-01 LAB — FERRITIN: Ferritin: 28 ng/mL (ref 11–307)

## 2019-04-01 LAB — LACTATE DEHYDROGENASE: LDH: 195 U/L — ABNORMAL HIGH (ref 98–192)

## 2019-04-01 NOTE — Progress Notes (Signed)
Cardiology Office Note:    Date:  04/02/2019   ID:  Amber Bautista, DOB 10-Jul-1971, MRN [id number]  PCP:  Amber Pal, DO  Cardiologist:  Amber More, MD   Referring MD: Amber Bautista*  ASSESSMENT:    1. Chronic systolic heart failure ([place])   2. Frequent PVCs   3. Dilated cardiomyopathy ([place])   4. Hypertensive heart disease with heart failure ([place])   5. Malignant neoplasm of left female breast, unspecified estrogen receptor status, unspecified site of breast ([place])    PLAN:    In order of problems listed above:  1. She has heart failure decompensated fluid overloaded symptomatic New York Heart Association class II to class III and will initiate therapy with a small dose of loop diuretic MRA potassium supplements continue beta-blocker and transition ARB to Entresto.  I requested her records from [place] 2. Frequent PVCs very concerning regarding risk of sudden death she has hypokalemia we will begin to replete her recheck in 1 week BMP proBNP magnesium level if magnesium is depleted she will need correction.  I initiated the discussion of an ICD and will discuss further at office review and follow-up 3. Severe nonischemic cardiomyopathy with decompensated heart failure optimize medical treatment assess ventricular arrhythmia and consider the merits of ICD therapy.  If there is a role in these patients is younger individuals with frequent ventricular arrhythmia. 4. She has significant hypertension today should respond to diuretics as well as Entresto 5. Increased risk with breast cancer and radiation of the left chest may be the mechanism of her cardiomyopathy  Next appointment 3 weeks   Medication Adjustments/Labs and Tests Ordered: Current medicines are reviewed at length with the patient today.  Concerns regarding medicines are outlined above.  Orders Placed This Encounter  Procedures  . Basic Metabolic Panel (BMET)  . Pro b natriuretic peptide  (BNP)[facility])  . Magnesium  . LONG TERM MONITOR (3-14 DAYS)  . EKG 12-Lead   Meds ordered this encounter  Medications  . DISCONTD: furosemide (LASIX) 20 MG tablet    Sig: Take 1 tablet (20 mg total) by mouth daily.    Dispense:  90 tablet    Refill:  1  . DISCONTD: potassium chloride SA (K-DUR) 20 MEQ tablet    Sig: Take 1 tablet (20 mEq total) by mouth 2 (two) times daily.    Dispense:  180 tablet    Refill:  1  . DISCONTD: spironolactone (ALDACTONE) 25 MG tablet    Sig: Take 1 tablet (25 mg total) by mouth daily.    Dispense:  90 tablet    Refill:  1  . DISCONTD: sacubitril-valsartan (ENTRESTO) 49-51 MG    Sig: Take 1 tablet by mouth 2 (two) times daily.    Dispense:  60 tablet    Refill:  2    Please Honor Card patient is presenting for Amber Bautista: [id number]; Amber Bautista: [id number]; IWLNL: [id number]; ISSUER: [id number] ID: [id number]  . furosemide (LASIX) 20 MG tablet    Sig: Take 1 tablet (20 mg total) by mouth daily.    Dispense:  30 tablet    Refill:  0  . potassium chloride SA (K-DUR) 20 MEQ tablet    Sig: Take 1 tablet (20 mEq total) by mouth 2 (two) times daily.    Dispense:  60 tablet    Refill:  0  . sacubitril-valsartan (ENTRESTO) 49-51 MG    Sig: Take 1 tablet by mouth 2 (two) times daily.  Dispense:  60 tablet    Refill:  2    Please Honor Card patient is presenting for Amber Bautista: [id number]; Amber Bautista: [id number]; KCLEX: [id number]; ISSUER: [id number] ID: [id number]  . spironolactone (ALDACTONE) 25 MG tablet    Sig: Take 1 tablet (25 mg total) by mouth daily.    Dispense:  30 tablet    Refill:  0     No chief complaint on file.   History of Present Illness:    Amber Bautista is a 48 y.o. female who is being seen today for the evaluation of heart failure with frequent PVC's at the request of Amber Bautista*. She had metastatic carcinoid with liver metastasis. She also had a history of stage IIb ductal carcinoma of the left breast. She received  adjuvant hemotherapy for this with 4 cycles of carboplatinum/Taxotere. She also had radiation therapy. I think this is all completed then 2013  Echocardiogram performed 08/11/2012 showed severe left ventricular dysfunction ejection fraction 25 to 30% mild left atrial enlargement mild mitral regurgitation.  The ventricle was severely dilated at that time  She was not really aware off or affected by her cardiomyopathy until the fall of last year.  She was referred to a cardiologist [facility] underwent a heart catheterization was told she had very weak heart muscle and normal coronary arteries I requested records.  She was started on treatment including beta-blocker and ARB and said she had a marked improvement at that time she had profound exercise intolerance and weakness and she felt as if she achieved normality see afterwards.  Recently she is aware of her heart beating and has felt marked exercise tolerance and weakness despite peripheral edema she is not short of breath has had episodes at night where her heart races and she feels weak but has not had syncope.  She is concerned about her prognosis and she has a 68 year old daughter.  She does not add salt to her diet but does not weigh every day she will begin she had radiation of the left chest but did not have anthracycline-based chemotherapy.  She has no angina congenital rheumatic heart disease.  She has chronic hypokalemia.  Past Medical History:  Diagnosis Date  . Abdominal pain 12/17/2014  . Breast cancer ([place]) 07/12/12   Stage IIB Ductal Carcinoma to  Upper Outer Quadrant: Left Breast: Triple Negative  . Cancer ([place]) 03/03/2015   Overview:  in intestine-2007, in liver-2007, and left breast-2013  . Cancer of upper-outer quadrant of female breast ([place]) 08/01/2012   Image guided biopsy Oct., 2013 -    2 cm by MRI, solitary;     IDC; TNBC; positive LVI;      . Carcinoid tumor of intestine 2001   Mets 2007  . Carcinoma of  breast treated with adjuvant chemotherapy ([place])    4 cycles of Carboplatin / Taxol  . Chronic congestive heart failure ([place]) 03/17/2019  . Diarrhea    Sandostatin  . Frequent PVCs 04/01/2019  . Goals of care, counseling/discussion 03/31/2019  . Hypertension   . Morbidly obese ([place])   . S/P radiation therapy 02/04/2013-02/21/2013   Left Breast and Axilla / 46.8 Gy in 26 fractions were planned (she only received 18 Gy in 10 fractions)    Past Surgical History:  Procedure Laterality Date  . BREAST SURGERY  08/23/12   Lt br lumpectomy  . [place] 2007  . COLON SURGERY  2001   Cancer/ Intestinal Resection  . OVARIAN  CYST REMOVAL  2001  . PARTIAL MASTECTOMY WITH NEEDLE LOCALIZATION AND AXILLARY SENTINEL LYMPH NODE BX  08/23/2012   Procedure: PARTIAL MASTECTOMY WITH NEEDLE LOCALIZATION AND AXILLARY SENTINEL LYMPH NODE BX;  Surgeon: Adin Hector, MD;  Location: [facility];  Service: General;  Laterality: Left;  blue dye injection   . PORTACATH PLACEMENT  08/23/2012   Procedure: INSERTION PORT-A-CATH;  Surgeon: Adin Hector, MD;  Location: [facility];  Service: General;  Laterality: Right;  intraop ultrasound    Current Medications: Current Meds  Medication Sig  . metoprolol succinate (TOPROL-XL) 50 MG 24 hr tablet Take 50 mg by mouth daily.   . nitroGLYCERIN (NITROSTAT) 0.4 MG SL tablet Place 0.4 mg under the tongue every 5 (five) minutes as needed for chest pain.  . [DISCONTINUED] valsartan (DIOVAN) 80 MG tablet Take 80 mg by mouth daily.     Allergies:   Sulfamethoxazole-trimethoprim   Social History   Socioeconomic History  . Marital status: Divorced    Spouse name: Not on file  . Number of children: Not on file  . Years of education: Not on file  . Highest education level: Not on file  Occupational History  . Not on file  Social Needs  . Financial resource strain: Not on file  . Food insecurity    Worry: Not on file    Inability: Not on file  . Transportation  needs    Medical: Not on file    Non-medical: Not on file  Tobacco Use  . Smoking status: Never Smoker  . Smokeless tobacco: Never Used  . Tobacco comment: NEVER SMOKED  Substance and Sexual Activity  . Alcohol use: No    Alcohol/week: 0.0 standard drinks  . Drug use: No  . Sexual activity: Not Currently    Comment: menarche age 37, P 3, G 1, 1 misscarriage  Lifestyle  . Physical activity    Days per week: Not on file    Minutes per session: Not on file  . Stress: Not on file  Relationships  . Social Herbalist on phone: Not on file    Gets together: Not on file    Attends religious service: Not on file    Active member of club or organization: Not on file    Attends meetings of clubs or organizations: Not on file    Relationship status: Not on file  Other Topics Concern  . Not on file  Social History Narrative  . Not on file     Family History: The patient's family history includes Asthma in her brother; Cancer in her paternal grandmother; Diabetes in her father and mother; Glaucoma in her mother; Heart attack in her father; Hypertension in her brother and mother.  ROS:   Review of Systems  Constitution: Positive for malaise/fatigue.  HENT: Negative.   Eyes: Negative.   Cardiovascular: Positive for irregular heartbeat, leg swelling and palpitations.  Respiratory: Negative.   Endocrine: Negative.   Hematologic/Lymphatic: Negative.   Skin: Negative.   Musculoskeletal: Negative.   Gastrointestinal: Negative.   Genitourinary: Negative.   Neurological: Negative.   Psychiatric/Behavioral: Negative.   Allergic/Immunologic: Negative.    Please see the history of present illness.     All other systems reviewed and are negative.  EKGs/Labs/Other Studies Reviewed:    The following studies were reviewed today:   EKG:  EKG 03/31/2019 personally reviewed and demonstrates SRTh NSIVCD frequent PVC's EKG 04/02/2019 ventricular bigeminy, same pattern of NSIVCD QRS  164  msec  Recent Labs: 03/31/2019: ALT 15; BUN 17; Creatinine 1.11; Hemoglobin 11.4; Platelet Count 411; Potassium 3.3; Sodium 140  Recent Lipid Panel No results found for: CHOL, TRIG, HDL, CHOLHDL, VLDL, LDLCALC, LDLDIRECT  Physical Exam:    VS:  BP (!) 178/112 (BP Location: Right Arm, Patient Position: Sitting, Cuff Size: Normal)   Pulse 99   Temp (!) 96 F (35.6 C)   Ht 5\' 6"  (1.676 m)   Wt 248 lb 12.8 oz (112.9 kg)   LMP 08/18/2014 Comment: menopausal: chemo induced  SpO2 99%   BMI 40.16 kg/m     Wt Readings from Last 3 Encounters:  04/02/19 248 lb 12.8 oz (112.9 kg)  03/31/19 248 lb (112.5 kg)  03/17/19 244 lb (110.7 kg)     GEN:  Well nourished, well developed in no acute distress HEENT: Normal NECK: No JVD; No carotid bruits LYMPHATICS: No lymphadenopathy CARDIAC: frequent PVC'sRRR, no murmurs, rubs, gallops RESPIRATORY:  Clear to auscultation without rales, wheezing or rhonchi  ABDOMEN: Soft, non-tender, non-distended MUSCULOSKELETAL:  2-3+ bilateral to the knee edema; No deformity  SKIN: Warm and dry NEUROLOGIC:  Alert and oriented x 3 PSYCHIATRIC:  Normal affect     Signed, Amber More, MD  04/02/2019 5:16 PM    [place]

## 2019-04-02 ENCOUNTER — Ambulatory Visit (INDEPENDENT_AMBULATORY_CARE_PROVIDER_SITE_OTHER): Payer: Medicare HMO | Admitting: Cardiology

## 2019-04-02 ENCOUNTER — Encounter: Payer: Self-pay | Admitting: Cardiology

## 2019-04-02 ENCOUNTER — Inpatient Hospital Stay: Payer: Medicare HMO

## 2019-04-02 ENCOUNTER — Telehealth: Payer: Self-pay | Admitting: Hematology & Oncology

## 2019-04-02 VITALS — BP 178/112 | HR 99 | Temp 96.0°F | Ht 66.0 in | Wt 248.8 lb

## 2019-04-02 DIAGNOSIS — E876 Hypokalemia: Secondary | ICD-10-CM | POA: Insufficient documentation

## 2019-04-02 DIAGNOSIS — I420 Dilated cardiomyopathy: Secondary | ICD-10-CM | POA: Diagnosis not present

## 2019-04-02 DIAGNOSIS — I110 Hypertensive heart disease with heart failure: Secondary | ICD-10-CM | POA: Diagnosis not present

## 2019-04-02 DIAGNOSIS — C50912 Malignant neoplasm of unspecified site of left female breast: Secondary | ICD-10-CM | POA: Diagnosis not present

## 2019-04-02 DIAGNOSIS — I5022 Chronic systolic (congestive) heart failure: Secondary | ICD-10-CM | POA: Diagnosis not present

## 2019-04-02 DIAGNOSIS — I493 Ventricular premature depolarization: Secondary | ICD-10-CM | POA: Diagnosis not present

## 2019-04-02 LAB — CHROMOGRANIN A: Chromogranin A (ng/mL): 4878 ng/mL — ABNORMAL HIGH (ref 0.0–101.8)

## 2019-04-02 MED ORDER — FUROSEMIDE 20 MG PO TABS: 20.0000 mg | ORAL_TABLET | Freq: Every day | ORAL | 1 refills | Status: DC

## 2019-04-02 MED ORDER — ENTRESTO 49-51 MG PO TABS: 1.0000 | ORAL_TABLET | Freq: Two times a day (BID) | ORAL | 2 refills | Status: DC

## 2019-04-02 MED ORDER — POTASSIUM CHLORIDE CRYS ER 20 MEQ PO TBCR: 20.0000 meq | EXTENDED_RELEASE_TABLET | Freq: Two times a day (BID) | ORAL | 1 refills | Status: DC

## 2019-04-02 MED ORDER — SPIRONOLACTONE 25 MG PO TABS: 25.0000 mg | ORAL_TABLET | Freq: Every day | ORAL | 0 refills | Status: DC

## 2019-04-02 MED ORDER — SPIRONOLACTONE 25 MG PO TABS: 25.0000 mg | ORAL_TABLET | Freq: Every day | ORAL | 1 refills | Status: DC

## 2019-04-02 MED ORDER — FUROSEMIDE 20 MG PO TABS: 20.0000 mg | ORAL_TABLET | Freq: Every day | ORAL | 0 refills | Status: DC

## 2019-04-02 MED ORDER — POTASSIUM CHLORIDE CRYS ER 20 MEQ PO TBCR: 20.0000 meq | EXTENDED_RELEASE_TABLET | Freq: Two times a day (BID) | ORAL | 0 refills | Status: DC

## 2019-04-02 MED FILL — FUROSEMIDE 20 MG TAB: 20 | 30 days supply | Qty: 30 | Fill #0

## 2019-04-02 MED FILL — POTASSIUM CHLORIDE CRYS ER: 20 | 30 days supply | Qty: 60 | Fill #0

## 2019-04-02 MED FILL — ENTRESTO 49 MG-51 MG TABLET: 49-51 | 30 days supply | Qty: 60 | Fill #0

## 2019-04-02 MED FILL — SPIRONOLACTONE 25 MG TABLET: 25 | 30 days supply | Qty: 30 | Fill #0

## 2019-04-02 NOTE — Patient Instructions (Addendum)
Medication Instructions:  Your physician has recommended you make the following change in your medication:   DISCONTINUE: Valsartan  START: Entresto 49/51 mg: Take 1 tab twice daily START:Furosemide '20mg'$ : Take 1 tab daily START: Spironolactone 25 mg: Take 1 tab daily START: Potassium 20 meq : Take 1 tab twice daily  If you need a refill on your cardiac medications before your next appointment, please call your pharmacy.   Lab work: Your physician recommends that you return for lab work in: NEXT Wednesday: BMP,Pro BNP,Magnesium    If you have labs (blood work) drawn today and your tests are completely normal, you will receive your results only by: Marland Kitchen MyChart Message (if you have MyChart) OR . A paper copy in the mail If you have any lab test that is abnormal or we need to change your treatment, we will call you to review the results.  Testing/Procedures: Your physician has recommended that you wear a ZIO monitor. ZIO monitors are medical devices that record the heart's electrical activity. Doctors most often use these monitors to diagnose arrhythmias. Arrhythmias are problems with the speed or rhythm of the heartbeat. The monitor is a small, portable device. You can wear one while you do your normal daily activities. This is usually used to diagnose what is causing palpitations/syncope (passing out).    Follow-Up: At [facility], you and your health needs are our priority.  As part of our continuing mission to provide you with exceptional heart care, we have created designated Provider Care Teams.  These Care Teams include your primary Cardiologist (physician) and Advanced Practice Providers (APPs -  Physician Assistants and Nurse Practitioners) who all work together to provide you with the care you need, when you need it. You will need a follow up appointment in 2 weeks.  Any Other Special Instructions Will Be Listed Below (If Applica   Heart Failure  Weigh yourself every morning  when you first wake up and record on a calender or note pad, bring this to your office visits. Using a pill tender can help with taking your medications consistently.  Limit your fluid intake to 2 liters daily  Limit your sodium intake to less than 2-3 grams daily. Ask if you need dietary teaching.  If you gain more than 3 pounds (from your dry weight ), double your dose of diuretic for the day.  If you gain more than 5 pounds (from your dry weight), double your dose of lasix and call your heart failure doctor.  Please do not smoke tobacco since it is very bad for your heart.  Please do not drink alcohol since it can worsen your heart failure.Also avoid OTC nonsteroidal drugs, such as advil, aleve and motrin.  Try to exercise for at least 30 minutes every day because this will help your heart be more efficient. You may be eligible for supervised cardiac rehab, ask your physician.  Sacubitril; Valsartan oral tablet What is this medicine? SACUBITRIL; VALSARTAN (sak UE bi tril; val SAR tan) is a combination of 2 drugs used to reduce the risk of death and hospitalizations in people with long-lasting heart failure. It is usually used with other medicines to treat heart failure. This medicine may be used for other purposes; ask your health care provider or pharmacist if you have questions. COMMON BRAND NAME(S): Entresto What should I tell my health care provider before I take this medicine? They need to know if you have any of these conditions: -diabetes and take a medicine that  contains aliskiren -kidney disease -liver disease -an unusual or allergic reaction to sacubitril; valsartan, drugs called angiotensin converting enzyme (ACE) inhibitors, angiotensin II receptor blockers (ARBs), other medicines, foods, dyes, or preservatives -pregnant or trying to get pregnant -breast-feeding How should I use this medicine? Take this medicine by mouth with a glass of water. Follow the directions on the  prescription label. You can take it with or without food. If it upsets your stomach, take it with food. Take your medicine at regular intervals. Do not take it more often than directed. Do not stop taking except on your doctor's advice. Do not take this medicine for at least 36 hours before or after you take an ACE inhibitor medicine. Talk to your health care provider if you are not sure if you take an ACE inhibitor. Talk to your pediatrician regarding the use of this medicine in children. Special care may be needed. Overdosage: If you think you have taken too much of this medicine contact a poison control center or emergency room at once. NOTE: This medicine is only for you. Do not share this medicine with others. What if I miss a dose? If you miss a dose, take it as soon as you can. If it is almost time for next dose, take only that dose. Do not take double or extra doses. What may interact with this medicine? Do not take this medicine with any of the following medicines: -aliskiren if you have diabetes -angiotensin-converting enzyme (ACE) inhibitors, like benazepril, captopril, enalapril, fosinopril, lisinopril, or ramipril This medicine may also interact with the following medicines: -angiotensin II receptor blockers (ARBs) like azilsartan, candesartan, eprosartan, irbesartan, losartan, olmesartan, telmisartan, or valsartan -lithium -NSAIDS, medicines for pain and inflammation, like ibuprofen or naproxen -potassium-sparing diuretics like amiloride, spironolactone, and triamterene -potassium supplements This list may not describe all possible interactions. Give your health care provider a list of all the medicines, herbs, non-prescription drugs, or dietary supplements you use. Also tell them if you smoke, drink alcohol, or use illegal drugs. Some items may interact with your medicine. What should I watch for while using this medicine? Tell your doctor or healthcare professional if your symptoms  do not start to get better or if they get worse. Do not become pregnant while taking this medicine. Women should inform their doctor if they wish to become pregnant or think they might be pregnant. There is a potential for serious side effects to an unborn child. Talk to your health care professional or pharmacist for more information. You may get dizzy. Do not drive, use machinery, or do anything that needs mental alertness until you know how this medicine affects you. Do not stand or sit up quickly, especially if you are an older patient. This reduces the risk of dizzy or fainting spells. Avoid alcoholic drinks; they can make you more dizzy. What side effects may I notice from receiving this medicine? Side effects that you should report to your doctor or health care professional as soon as possible: -allergic reactions like skin rash, itching or hives, swelling of the face, lips, or tongue -signs and symptoms of increased potassium like muscle weakness; chest pain; or fast, irregular heartbeat -signs and symptoms of kidney injury like trouble passing urine or change in the amount of urine -signs and symptoms of low blood pressure like feeling dizzy or lightheaded, or if you develop extreme fatigue Side effects that usually do not require medical attention (report to your doctor or health care professional if they continue or  are bothersome): -cough This list may not describe all possible side effects. Call your doctor for medical advice about side effects. You may report side effects to FDA at 1-800-FDA-1088. Where should I keep my medicine? Keep out of the reach of children. Store at room temperature between 15 and 30 degrees C (59 and 86 degrees F). Throw away any unused medicine after the expiration date. NOTE: This sheet is a summary. It may not cover all possible information. If you have questions about this medicine, talk to your doctor, pharmacist, or health care provider.  2019  Elsevier/Gold Standard (2015-11-10 13:54:19)  Furosemide tablets What is this medicine? FUROSEMIDE (fyoor [place] se mide) is a diuretic. It helps you make more urine and to lose salt and excess water from your body. This medicine is used to treat high blood pressure, and edema or swelling from heart, kidney, or liver disease. This medicine may be used for other purposes; ask your health care provider or pharmacist if you have questions. COMMON BRAND NAME(S): Active-Medicated Specimen Kit, Delone, Diuscreen, Lasix, RX Specimen Collection Kit, Specimen Collection Kit, URINX Medicated Specimen Collection What should I tell my health care provider before I take this medicine? They need to know if you have any of these conditions: -abnormal blood electrolytes -diarrhea or vomiting -gout -heart disease -kidney disease, small amounts of urine, or difficulty passing urine -liver disease -thyroid disease -an unusual or allergic reaction to furosemide, sulfa drugs, other medicines, foods, dyes, or preservatives -pregnant or trying to get pregnant -breast-feeding How should I use this medicine? Take this medicine by mouth with a glass of water. Follow the directions on the prescription label. You may take this medicine with or without food. If it upsets your stomach, take it with food or milk. Do not take your medicine more often than directed. Remember that you will need to pass more urine after taking this medicine. Do not take your medicine at a time of day that will cause you problems. Do not take at bedtime. Talk to your pediatrician regarding the use of this medicine in children. While this drug may be prescribed for selected conditions, precautions do apply. Overdosage: If you think you have taken too much of this medicine contact a poison control center or emergency room at once. NOTE: This medicine is only for you. Do not share this medicine with others. What if I miss a dose? If you miss a dose,  take it as soon as you can. If it is almost time for your next dose, take only that dose. Do not take double or extra doses. What may interact with this medicine? -aspirin and aspirin-like medicines -certain antibiotics -chloral hydrate -cisplatin -cyclosporine -digoxin -diuretics -laxatives -lithium -medicines for blood pressure -medicines that relax muscles for surgery -methotrexate -NSAIDs, medicines for pain and inflammation like ibuprofen, naproxen, or indomethacin -phenytoin -steroid medicines like prednisone or cortisone -sucralfate -thyroid hormones This list may not describe all possible interactions. Give your health care provider a list of all the medicines, herbs, non-prescription drugs, or dietary supplements you use. Also tell them if you smoke, drink alcohol, or use illegal drugs. Some items may interact with your medicine. What should I watch for while using this medicine? Visit your doctor or health care professional for regular checks on your progress. Check your blood pressure regularly. Ask your doctor or health care professional what your blood pressure should be, and when you should contact him or her. If you are a diabetic, check your blood sugar  as directed. You may need to be on a special diet while taking this medicine. Check with your doctor. Also, ask how many glasses of fluid you need to drink a day. You must not get dehydrated. You may get drowsy or dizzy. Do not drive, use machinery, or do anything that needs mental alertness until you know how this drug affects you. Do not stand or sit up quickly, especially if you are an older patient. This reduces the risk of dizzy or fainting spells. Alcohol can make you more drowsy and dizzy. Avoid alcoholic drinks. This medicine can make you more sensitive to the sun. Keep out of the sun. If you cannot avoid being in the sun, wear protective clothing and use sunscreen. Do not use sun lamps or tanning beds/booths. What  side effects may I notice from receiving this medicine? Side effects that you should report to your doctor or health care professional as soon as possible: -blood in urine or stools -dry mouth -fever or chills -hearing loss or ringing in the ears -irregular heartbeat -muscle pain or weakness, cramps -skin rash -stomach upset, pain, or nausea -tingling or numbness in the hands or feet -unusually weak or tired -vomiting or diarrhea -yellowing of the eyes or skin Side effects that usually do not require medical attention (report to your doctor or health care professional if they continue or are bothersome): -headache -loss of appetite -unusual bleeding or bruising This list may not describe all possible side effects. Call your doctor for medical advice about side effects. You may report side effects to FDA at 1-800-FDA-1088. Where should I keep my medicine? Keep out of the reach of children. Store at room temperature between 15 and 30 degrees C (59 and 86 degrees F). Protect from light. Throw away any unused medicine after the expiration date. NOTE: This sheet is a summary. It may not cover all possible information. If you have questions about this medicine, talk to your doctor, pharmacist, or health care provider.  2019 Elsevier/Gold Standard (2014-12-16 13:49:50)  Spironolactone tablets What is this medicine? SPIRONOLACTONE (speer on [place] LAK tone) is a diuretic. It helps you make more urine and to lose excess water from your body. This medicine is used to treat high blood pressure, and edema or swelling from heart, kidney, or liver disease. It is also used to treat patients who make too much aldosterone or have low potassium. This medicine may be used for other purposes; ask your health care provider or pharmacist if you have questions. COMMON BRAND NAME(S): Aldactone What should I tell my health care provider before I take this medicine? They need to know if you have any of these  conditions: -high blood level of potassium -kidney disease or trouble making urine -liver disease -an unusual or allergic reaction to spironolactone, other medicines, foods, dyes, or preservatives -pregnant or trying to get pregnant -breast-feeding How should I use this medicine? Take this medicine by mouth with a drink of water. Follow the directions on your prescription label. You can take it with or without food. If it upsets your stomach, take it with food. Do not take your medicine more often than directed. Remember that you will need to pass more urine after taking this medicine. Do not take your doses at a time of day that will cause you problems. Do not take at bedtime. Talk to your pediatrician regarding the use of this medicine in children. While this drug may be prescribed for selected conditions, precautions do apply. Overdosage:  If you think you have taken too much of this medicine contact a poison control center or emergency room at once. NOTE: This medicine is only for you. Do not share this medicine with others. What if I miss a dose? If you miss a dose, take it as soon as you can. If it is almost time for your next dose, take only that dose. Do not take double or extra doses. What may interact with this medicine? Do not take this medicine with any of the following medications: -cidofovir -eplerenone -tranylcypromine This medicine may also interact with the following medications: -aspirin -certain medicines for blood pressure or heart disease like benazepril, lisinopril, losartan, valsartan -certain medicines that treat or prevent blood clots like heparin and enoxaparin -cholestyramine -cyclosporine -digoxin -lithium -medicines that relax muscles for surgery -NSAIDs, medicines for pain and inflammation, like ibuprofen or naproxen -other diuretics -potassium supplements -steroid medicines like prednisone or cortisone -trimethoprim This list may not describe all  possible interactions. Give your health care provider a list of all the medicines, herbs, non-prescription drugs, or dietary supplements you use. Also tell them if you smoke, drink alcohol, or use illegal drugs. Some items may interact with your medicine. What should I watch for while using this medicine? Visit your doctor or health care professional for regular checks on your progress. Check your blood pressure as directed. Ask your doctor what your blood pressure should be, and when you should contact them. You may need to be on a special diet while taking this medicine. Ask your doctor. Also, ask how many glasses of fluid you need to drink a day. You must not get dehydrated. This medicine may make you feel confused, dizzy or lightheaded. Drinking alcohol and taking some medicines can make this worse. Do not drive, use machinery, or do anything that needs mental alertness until you know how this medicine affects you. Do not sit or stand up quickly. What side effects may I notice from receiving this medicine? Side effects that you should report to your doctor or health care professional as soon as possible: -allergic reactions such as skin rash or itching, hives, swelling of the lips, mouth, tongue, or throat -black or tarry stools -fast, irregular heartbeat -fever -muscle pain, cramps -numbness, tingling in hands or feet -trouble breathing -trouble passing urine -unusual bleeding -unusually weak or tired Side effects that usually do not require medical attention (report to your doctor or health care professional if they continue or are bothersome): -change in voice or hair growth -confusion -dizzy, drowsy -dry mouth, increased thirst -enlarged or tender breasts -headache -irregular menstrual periods -sexual difficulty, unable to have an erection -stomach upset This list may not describe all possible side effects. Call your doctor for medical advice about side effects. You may report side  effects to FDA at 1-800-FDA-1088. Where should I keep my medicine? Keep out of the reach of children. Store below 25 degrees C (77 degrees F). Throw away any unused medicine after the expiration date. NOTE: This sheet is a summary. It may not cover all possible information. If you have questions about this medicine, talk to your doctor, pharmacist, or health care provider.  2019 Elsevier/Gold Standard (2017-02-09 09:42:28)  Potassium chloride tablets, extended-release tablets or capsules What is this medicine? POTASSIUM CHLORIDE (poe TASS i um KLOOR ide) is a potassium supplement used to prevent and to treat low potassium. Potassium is important for the heart, muscles, and nerves. Too much or too little potassium in the body can cause  serious problems. This medicine may be used for other purposes; ask your health care provider or pharmacist if you have questions. COMMON BRAND NAME(S): ED-K+10, K-10, K-8, K-Dur, K-Tab, Kaon-CL, Klor-Con, Klor-Con M10, Klor-Con M15, Klor-Con M20, Klotrix, Micro-K, Micro-K Extencaps, Slow-K What should I tell my health care provider before I take this medicine? They need to know if you have any of these conditions: -Addison's disease -dehydration -diabetes -difficulty swallowing -heart disease -high levels of potassium in the blood -irregular heartbeat -kidney disease -recent severe burn -stomach ulcers or other stomach problems -an unusual or allergic reaction to potassium, tartrazine, other medicines, foods, dyes, or preservatives -pregnant or trying to get pregnant -breast-feeding How should I use this medicine? Take this medicine by mouth with a full glass of water. Take with food. Follow the directions on the prescription label. Do not suck on, crush, or chew this medicine. If you have difficulty swallowing, ask the pharmacist how to take. Take your medicine at regular intervals. Do not take it more often than directed. Do not stop taking except on  your doctor's advice. Talk to your pediatrician regarding the use of this medicine in children. Special care may be needed. Overdosage: If you think you have taken too much of this medicine contact a poison control center or emergency room at once. NOTE: This medicine is only for you. Do not share this medicine with others. What if I miss a dose? If you miss a dose, take it as soon as you can. If it is almost time for your next dose, take only that dose. Do not take double or extra doses. What may interact with this medicine? Do not take this medicine with any of the following medications: -certain diuretics such as spironolactone, triamterene -certain medicines for stomach problems like atropine; difenoxin and glycopyrrolate -eplerenone -sodium polystyrene sulfonate This medicine may also interact with the following medications: -certain medicines for blood pressure or heart disease like lisinopril, losartan, quinapril, valsartan -medicines that lower your chance of fighting infection such as cyclosporine, tacrolimus -NSAIDs, medicines for pain and inflammation, like ibuprofen or naproxen -other potassium supplements -salt substitutes This list may not describe all possible interactions. Give your health care provider a list of all the medicines, herbs, non-prescription drugs, or dietary supplements you use. Also tell them if you smoke, drink alcohol, or use illegal drugs. Some items may interact with your medicine. What should I watch for while using this medicine? Visit your doctor or health care professional for regular check ups. You will need lab work done regularly. You may need to be on a special diet while taking this medicine. Ask your doctor. What side effects may I notice from receiving this medicine? Side effects that you should report to your doctor or health care professional as soon as possible: -allergic reactions like skin rash, itching or hives, swelling of the face, lips,  or tongue -black, tarry stools -breathing problems -confusion -heartburn -fast, irregular heartbeat -feeling faint or lightheaded, falls -low blood pressure -numbness or tingling in hands or feet -pain when swallowing -unusually weak or tired -weakness, heaviness of legs Side effects that usually do not require medical attention (report to your doctor or health care professional if they continue or are bothersome): -diarrhea -nausea, vomiting -stomach pain This list may not describe all possible side effects. Call your doctor for medical advice about side effects. You may report side effects to FDA at 1-800-FDA-1088. Where should I keep my medicine? Keep out of the reach of children. Store  at room temperature between 15 and 30 degrees C (59 and 86 degrees F ). Keep bottle closed tightly to protect this medicine from light and moisture. Throw away any unused medicine after the expiration date. NOTE: This sheet is a summary. It may not cover all possible information. If you have questions about this medicine, talk to your doctor, pharmacist, or health care provider.  2019 Elsevier/Gold Standard (2016-06-28 11:43:27)

## 2019-04-02 NOTE — Telephone Encounter (Signed)
Per 6/24 staff message from [facility] due to patient using [employer] for transportation she will schedule 2nd iron infusion when she comes into the office next week for iron.

## 2019-04-08 ENCOUNTER — Other Ambulatory Visit (INDEPENDENT_AMBULATORY_CARE_PROVIDER_SITE_OTHER): Payer: Medicare HMO

## 2019-04-08 DIAGNOSIS — I493 Ventricular premature depolarization: Secondary | ICD-10-CM | POA: Diagnosis not present

## 2019-04-09 ENCOUNTER — Inpatient Hospital Stay: Payer: Medicare HMO | Attending: Hematology & Oncology

## 2019-04-09 ENCOUNTER — Other Ambulatory Visit: Payer: Self-pay

## 2019-04-09 ENCOUNTER — Other Ambulatory Visit: Payer: Self-pay | Admitting: Hematology & Oncology

## 2019-04-09 VITALS — BP 130/84 | HR 92 | Temp 97.0°F

## 2019-04-09 DIAGNOSIS — I509 Heart failure, unspecified: Secondary | ICD-10-CM | POA: Diagnosis not present

## 2019-04-09 DIAGNOSIS — C7B02 Secondary carcinoid tumors of liver: Secondary | ICD-10-CM | POA: Insufficient documentation

## 2019-04-09 DIAGNOSIS — Z9221 Personal history of antineoplastic chemotherapy: Secondary | ICD-10-CM | POA: Insufficient documentation

## 2019-04-09 DIAGNOSIS — R008 Other abnormalities of heart beat: Secondary | ICD-10-CM | POA: Diagnosis not present

## 2019-04-09 DIAGNOSIS — R000 Tachycardia, unspecified: Secondary | ICD-10-CM | POA: Insufficient documentation

## 2019-04-09 DIAGNOSIS — I420 Dilated cardiomyopathy: Secondary | ICD-10-CM | POA: Diagnosis not present

## 2019-04-09 DIAGNOSIS — Z79899 Other long term (current) drug therapy: Secondary | ICD-10-CM | POA: Diagnosis not present

## 2019-04-09 DIAGNOSIS — I493 Ventricular premature depolarization: Secondary | ICD-10-CM | POA: Diagnosis not present

## 2019-04-09 DIAGNOSIS — Z171 Estrogen receptor negative status [ER-]: Secondary | ICD-10-CM | POA: Diagnosis not present

## 2019-04-09 DIAGNOSIS — C7B04 Secondary carcinoid tumors of peritoneum: Secondary | ICD-10-CM | POA: Insufficient documentation

## 2019-04-09 DIAGNOSIS — C50912 Malignant neoplasm of unspecified site of left female breast: Secondary | ICD-10-CM

## 2019-04-09 DIAGNOSIS — C7A00 Malignant carcinoid tumor of unspecified site: Secondary | ICD-10-CM | POA: Insufficient documentation

## 2019-04-09 DIAGNOSIS — I447 Left bundle-branch block, unspecified: Secondary | ICD-10-CM | POA: Diagnosis not present

## 2019-04-09 DIAGNOSIS — D3A098 Benign carcinoid tumors of other sites: Secondary | ICD-10-CM

## 2019-04-09 DIAGNOSIS — I5022 Chronic systolic (congestive) heart failure: Secondary | ICD-10-CM | POA: Diagnosis not present

## 2019-04-09 DIAGNOSIS — R6889 Other general symptoms and signs: Secondary | ICD-10-CM | POA: Diagnosis not present

## 2019-04-09 MED ORDER — SODIUM CHLORIDE 0.9 % IV SOLN
510.0000 mg | Freq: Once | INTRAVENOUS | Status: AC
  Administered 2019-04-09: 510 mg via INTRAVENOUS
  Filled 2019-04-09: qty 17

## 2019-04-09 MED ORDER — SODIUM CHLORIDE 0.9 % IV SOLN
Freq: Once | INTRAVENOUS | Status: AC
  Administered 2019-04-09: 13:00:00 via INTRAVENOUS
  Filled 2019-04-09: qty 250

## 2019-04-09 MED ORDER — LANREOTIDE ACETATE 120 MG/0.5ML ~~LOC~~ SOLN
SUBCUTANEOUS | Status: AC
  Filled 2019-04-09: qty 120

## 2019-04-09 MED ORDER — LANREOTIDE ACETATE 120 MG/0.5ML ~~LOC~~ SOLN
120.0000 mg | Freq: Once | SUBCUTANEOUS | Status: AC
  Administered 2019-04-09: 15:00:00 120 mg via SUBCUTANEOUS

## 2019-04-09 NOTE — Patient Instructions (Signed)
Ferumoxytol injection What is this medicine? FERUMOXYTOL is an iron complex. Iron is used to make healthy red blood cells, which carry oxygen and nutrients throughout the body. This medicine is used to treat iron deficiency anemia. This medicine may be used for other purposes; ask your health care provider or pharmacist if you have questions. COMMON BRAND NAME(S): Feraheme What should I tell my health care provider before I take this medicine? They need to know if you have any of these conditions:  anemia not caused by low iron levels  high levels of iron in the blood  magnetic resonance imaging (MRI) test scheduled  an unusual or allergic reaction to iron, other medicines, foods, dyes, or preservatives  pregnant or trying to get pregnant  breast-feeding How should I use this medicine? This medicine is for injection into a vein. It is given by a health care professional in a hospital or clinic setting. Talk to your pediatrician regarding the use of this medicine in children. Special care may be needed. Overdosage: If you think you have taken too much of this medicine contact a poison control center or emergency room at once. NOTE: This medicine is only for you. Do not share this medicine with others. What if I miss a dose? It is important not to miss your dose. Call your doctor or health care professional if you are unable to keep an appointment. What may interact with this medicine? This medicine may interact with the following medications:  other iron products This list may not describe all possible interactions. Give your health care provider a list of all the medicines, herbs, non-prescription drugs, or dietary supplements you use. Also tell them if you smoke, drink alcohol, or use illegal drugs. Some items may interact with your medicine. What should I watch for while using this medicine? Visit your doctor or healthcare professional regularly. Tell your doctor or healthcare  professional if your symptoms do not start to get better or if they get worse. You may need blood work done while you are taking this medicine. You may need to follow a special diet. Talk to your doctor. Foods that contain iron include: whole grains/cereals, dried fruits, beans, or peas, leafy green vegetables, and organ meats (liver, kidney). What side effects may I notice from receiving this medicine? Side effects that you should report to your doctor or health care professional as soon as possible:  allergic reactions like skin rash, itching or hives, swelling of the face, lips, or tongue  breathing problems  changes in blood pressure  feeling faint or lightheaded, falls  fever or chills  flushing, sweating, or hot feelings  swelling of the ankles or feet Side effects that usually do not require medical attention (report to your doctor or health care professional if they continue or are bothersome):  diarrhea  headache  nausea, vomiting  stomach pain This list may not describe all possible side effects. Call your doctor for medical advice about side effects. You may report side effects to FDA at 1-800-FDA-1088. Where should I keep my medicine? This drug is given in a hospital or clinic and will not be stored at home. NOTE: This sheet is a summary. It may not cover all possible information. If you have questions about this medicine, talk to your doctor, pharmacist, or health care provider.  2020 Elsevier/Gold Standard (2016-11-13 20:21:10)   Lanreotide injection What is this medicine? LANREOTIDE (lan REE oh tide) is used to reduce blood levels of growth hormone in patients  with a condition called acromegaly. It also works to slow or stop tumor growth in patients with neuroendocrine tumors and treat carcinoid syndrome. This medicine may be used for other purposes; ask your health care provider or pharmacist if you have questions. COMMON BRAND NAME(S): Somatuline Depot What  should I tell my health care provider before I take this medicine? They need to know if you have any of these conditions:  diabetes  gallbladder disease  heart disease  kidney disease  liver disease  thyroid disease  an unusual or allergic reaction to lanreotide, other medicines, foods, dyes, or preservatives  pregnant or trying to get pregnant  breast-feeding How should I use this medicine? This medicine is for injection under the skin. It is given by a health care professional in a hospital or clinic setting. Contact your pediatrician or health care professional regarding the use of this medicine in children. Special care may be needed. Overdosage: If you think you have taken too much of this medicine contact a poison control center or emergency room at once. NOTE: This medicine is only for you. Do not share this medicine with others. What if I miss a dose? It is important not to miss your dose. Call your doctor or health care professional if you are unable to keep an appointment. What may interact with this medicine? This medicine may interact with the following medications:  bromocriptine  cyclosporine  certain medicines for blood pressure, heart disease, irregular heart beat  certain medicines for diabetes  quinidine  terfenadine This list may not describe all possible interactions. Give your health care provider a list of all the medicines, herbs, non-prescription drugs, or dietary supplements you use. Also tell them if you smoke, drink alcohol, or use illegal drugs. Some items may interact with your medicine. What should I watch for while using this medicine? Tell your doctor or healthcare professional if your symptoms do not start to get better or if they get worse. Visit your doctor or health care professional for regular checks on your progress. Your condition will be monitored carefully while you are receiving this medicine. This medicine may increase blood  sugar. Ask your healthcare provider if changes in diet or medicines are needed if you have diabetes. You may need blood work done while you are taking this medicine. Women should inform their doctor if they wish to become pregnant or think they might be pregnant. There is a potential for serious side effects to an unborn child. Talk to your health care professional or pharmacist for more information. Do not breast-feed an infant while taking this medicine or for 6 months after stopping it. This medicine has caused ovarian failure in some women. This medicine may interfere with the ability to have a child. Talk with your doctor or health care professional if you are concerned about your fertility. What side effects may I notice from receiving this medicine? Side effects that you should report to your doctor or health care professional as soon as possible:  allergic reactions like skin rash, itching or hives, swelling of the face, lips, or tongue  increased blood pressure  severe stomach pain  signs and symptoms of hgh blood sugar such as being more thirsty or hungry or having to urinate more than normal. You may also feel very tired or have blurry vision.  signs and symptoms of low blood sugar such as feeling anxious; confusion; dizziness; increased hunger; unusually weak or tired; sweating; shakiness; cold; irritable; headache; blurred vision; fast  heartbeat; loss of consciousness  unusually slow heartbeat Side effects that usually do not require medical attention (report to your doctor or health care professional if they continue or are bothersome):  constipation  diarrhea  dizziness  headache  muscle pain  muscle spasms  nausea  pain, redness, or irritation at site where injected This list may not describe all possible side effects. Call your doctor for medical advice about side effects. You may report side effects to FDA at 1-800-FDA-1088. Where should I keep my medicine? This  drug is given in a hospital or clinic and will not be stored at home. NOTE: This sheet is a summary. It may not cover all possible information. If you have questions about this medicine, talk to your doctor, pharmacist, or health care provider.  2020 Elsevier/Gold Standard (2018-07-04 09:13:08)

## 2019-04-09 NOTE — Progress Notes (Signed)
Per [facility] [place] and [place] [address] are both approved

## 2019-04-10 ENCOUNTER — Telehealth: Payer: Self-pay | Admitting: *Deleted

## 2019-04-10 LAB — PRO B NATRIURETIC PEPTIDE: NT-Pro BNP: 469 pg/mL — ABNORMAL HIGH (ref 0–249)

## 2019-04-10 LAB — BASIC METABOLIC PANEL
BUN/Creatinine Ratio: 16 (ref 9–23)
BUN: 18 mg/dL (ref 6–24)
CO2: 16 mmol/L — ABNORMAL LOW (ref 20–29)
Calcium: 9.5 mg/dL (ref 8.7–10.2)
Chloride: 104 mmol/L (ref 96–106)
Creatinine, Ser: 1.13 mg/dL — ABNORMAL HIGH (ref 0.57–1.00)
GFR calc Af Amer: 66 mL/min/{1.73_m2} (ref >59)
GFR calc non Af Amer: 57 mL/min/{1.73_m2} — ABNORMAL LOW (ref >59)
Glucose: 137 mg/dL — ABNORMAL HIGH (ref 65–99)
Potassium: 4.3 mmol/L (ref 3.5–5.2)
Sodium: 135 mmol/L (ref 134–144)

## 2019-04-10 LAB — MAGNESIUM: Magnesium: 1.7 mg/dL (ref 1.6–2.3)

## 2019-04-10 NOTE — Telephone Encounter (Signed)
-----   Message from Richardo Priest, MD sent at 04/10/2019  8:00 AM EDT ----- Normal or stable result

## 2019-04-10 NOTE — Telephone Encounter (Signed)
Telephone call to patient. Left message that labs were normal/stable and to call with any questions.

## 2019-04-14 ENCOUNTER — Other Ambulatory Visit: Payer: Self-pay

## 2019-04-14 ENCOUNTER — Emergency Department (HOSPITAL_COMMUNITY)
Admission: EM | Admit: 2019-04-14 | Discharge: 2019-04-14 | Disposition: A | Discharge status: Home / Self Care | Payer: Medicare HMO | Attending: Emergency Medicine | Admitting: Emergency Medicine

## 2019-04-14 ENCOUNTER — Emergency Department (HOSPITAL_COMMUNITY): Payer: Medicare HMO

## 2019-04-14 DIAGNOSIS — E860 Dehydration: Secondary | ICD-10-CM | POA: Insufficient documentation

## 2019-04-14 DIAGNOSIS — S0990XA Unspecified injury of head, initial encounter: Secondary | ICD-10-CM | POA: Diagnosis not present

## 2019-04-14 DIAGNOSIS — Z79899 Other long term (current) drug therapy: Secondary | ICD-10-CM | POA: Insufficient documentation

## 2019-04-14 DIAGNOSIS — Z853 Personal history of malignant neoplasm of breast: Secondary | ICD-10-CM | POA: Insufficient documentation

## 2019-04-14 DIAGNOSIS — I447 Left bundle-branch block, unspecified: Secondary | ICD-10-CM | POA: Diagnosis not present

## 2019-04-14 DIAGNOSIS — I5022 Chronic systolic (congestive) heart failure: Secondary | ICD-10-CM | POA: Diagnosis not present

## 2019-04-14 DIAGNOSIS — Z9221 Personal history of antineoplastic chemotherapy: Secondary | ICD-10-CM | POA: Diagnosis not present

## 2019-04-14 DIAGNOSIS — Z171 Estrogen receptor negative status [ER-]: Secondary | ICD-10-CM | POA: Diagnosis not present

## 2019-04-14 DIAGNOSIS — R55 Syncope and collapse: Secondary | ICD-10-CM | POA: Diagnosis not present

## 2019-04-14 DIAGNOSIS — R000 Tachycardia, unspecified: Secondary | ICD-10-CM | POA: Diagnosis not present

## 2019-04-14 DIAGNOSIS — I110 Hypertensive heart disease with heart failure: Secondary | ICD-10-CM | POA: Diagnosis not present

## 2019-04-14 DIAGNOSIS — C50912 Malignant neoplasm of unspecified site of left female breast: Secondary | ICD-10-CM | POA: Diagnosis not present

## 2019-04-14 DIAGNOSIS — I491 Atrial premature depolarization: Secondary | ICD-10-CM | POA: Diagnosis not present

## 2019-04-14 DIAGNOSIS — R42 Dizziness and giddiness: Secondary | ICD-10-CM | POA: Diagnosis not present

## 2019-04-14 DIAGNOSIS — I959 Hypotension, unspecified: Secondary | ICD-10-CM | POA: Diagnosis not present

## 2019-04-14 DIAGNOSIS — C7A00 Malignant carcinoid tumor of unspecified site: Secondary | ICD-10-CM | POA: Diagnosis not present

## 2019-04-14 DIAGNOSIS — C7B02 Secondary carcinoid tumors of liver: Secondary | ICD-10-CM | POA: Diagnosis not present

## 2019-04-14 DIAGNOSIS — C7B04 Secondary carcinoid tumors of peritoneum: Secondary | ICD-10-CM | POA: Diagnosis not present

## 2019-04-14 LAB — CBC WITH DIFFERENTIAL/PLATELET
Abs Immature Granulocytes: 0.05 10*3/uL (ref 0.00–0.07)
Basophils Absolute: 0.1 10*3/uL (ref 0.0–0.1)
Basophils Relative: 1 %
Eosinophils Absolute: 0.1 10*3/uL (ref 0.0–0.5)
Eosinophils Relative: 1 %
HCT: 47.3 % — ABNORMAL HIGH (ref 36.0–46.0)
Hemoglobin: 13.9 g/dL (ref 12.0–15.0)
Immature Granulocytes: 1 %
Lymphocytes Relative: 15 %
Lymphs Abs: 1.6 10*3/uL (ref 0.7–4.0)
MCH: 25.3 pg — ABNORMAL LOW (ref 26.0–34.0)
MCHC: 29.4 g/dL — ABNORMAL LOW (ref 30.0–36.0)
MCV: 86.2 fL (ref 80.0–100.0)
Monocytes Absolute: 0.6 10*3/uL (ref 0.1–1.0)
Monocytes Relative: 6 %
Neutro Abs: 8.6 10*3/uL — ABNORMAL HIGH (ref 1.7–7.7)
Neutrophils Relative %: 76 %
Platelets: 457 10*3/uL — ABNORMAL HIGH (ref 150–400)
RBC: 5.49 MIL/uL — ABNORMAL HIGH (ref 3.87–5.11)
RDW: 16.2 % — ABNORMAL HIGH (ref 11.5–15.5)
WBC: 11 10*3/uL — ABNORMAL HIGH (ref 4.0–10.5)
nRBC: 0 % (ref 0.0–0.2)

## 2019-04-14 LAB — COMPREHENSIVE METABOLIC PANEL
ALT: 20 U/L (ref 0–44)
AST: 22 U/L (ref 15–41)
Albumin: 3.2 g/dL — ABNORMAL LOW (ref 3.5–5.0)
Alkaline Phosphatase: 89 U/L (ref 38–126)
Anion gap: 8 (ref 5–15)
BUN: 15 mg/dL (ref 6–20)
CO2: 21 mmol/L — ABNORMAL LOW (ref 22–32)
Calcium: 9.2 mg/dL (ref 8.9–10.3)
Chloride: 108 mmol/L (ref 98–111)
Creatinine, Ser: 1.42 mg/dL — ABNORMAL HIGH (ref 0.44–1.00)
GFR calc Af Amer: 50 mL/min — ABNORMAL LOW (ref >60)
GFR calc non Af Amer: 43 mL/min — ABNORMAL LOW (ref >60)
Glucose, Bld: 121 mg/dL — ABNORMAL HIGH (ref 70–99)
Potassium: 3.7 mmol/L (ref 3.5–5.1)
Sodium: 137 mmol/L (ref 135–145)
Total Bilirubin: 0.8 mg/dL (ref 0.3–1.2)
Total Protein: 7.9 g/dL (ref 6.5–8.1)

## 2019-04-14 LAB — MAGNESIUM: Magnesium: 1.6 mg/dL — ABNORMAL LOW (ref 1.7–2.4)

## 2019-04-14 MED ORDER — SODIUM CHLORIDE 0.9 % IV BOLUS
500.0000 mL | Freq: Once | INTRAVENOUS | Status: AC
  Administered 2019-04-14: 500 mL via INTRAVENOUS

## 2019-04-14 MED ORDER — MAGNESIUM SULFATE 2 GM/50ML IV SOLN
2.0000 g | Freq: Once | INTRAVENOUS | Status: AC
  Administered 2019-04-14: 2 g via INTRAVENOUS
  Filled 2019-04-14: qty 50

## 2019-04-14 MED ORDER — LACTATED RINGERS IV BOLUS
1000.0000 mL | Freq: Once | INTRAVENOUS | Status: AC
  Administered 2019-04-14: 1000 mL via INTRAVENOUS

## 2019-04-14 NOTE — ED Triage Notes (Signed)
States seen in [place] for heart issues/ bradycardia.  D/t Covid has had delayed follow up.   Recently had Fe transfusion at the [facility] and has had a holter monitor through today.  Today Syncopal episode where she slid of the toilet.  States she doesn't remember falling, struck her head with possible LOC.   C/o some pain to frontal head.  No neck tenderness.  No chest pain or SOB.  Alert and oriented in NAD.

## 2019-04-14 NOTE — ED Provider Notes (Signed)
[facility] Provider Note   CSN: [id number] Arrival date & time: 04/14/19  1737    History   Chief Complaint No chief complaint on file.   HPI [facility] Bautista is a 48 y.o. female.     The history is provided by the patient and medical records. No language interpreter was used.   [facility] Bautista is a 47 y.o. female Bautista presents to the Emergency Department complaining of syncope.  She presents to the ED by EMS following a syncopal event.  This occurred about one hour prior to ED arrival.  She was feeling dizzy/lightheaded and went to the bathroom and sat on the commode.  She was trying to urinate/have a bowel movement and experienced progressive weakness.  She passed out and hit her head, landing on the floor.  She has pain to the front of her head, her left knee.  Currently she feels weak and fatigued.  Denies fevers, chest pain, sob, abdominal, N/V, dysuria.  She has some diarrhea (which is her normal).   Past Medical History:  Diagnosis Date  . Abdominal pain 12/17/2014  . Breast cancer ([place]) 07/12/12   Stage IIB Ductal Carcinoma to  Upper Outer Quadrant: Left Breast: Triple Negative  . Cancer ([place]) 03/03/2015   Overview:  in intestine-2007, in liver-2007, and left breast-2013  . Cancer of upper-outer quadrant of female breast ([place]) 08/01/2012   Image guided biopsy Oct., 2013 -    2 cm by MRI, solitary;     IDC; TNBC; positive LVI;      . Carcinoid tumor of intestine 2001   [place] 2007  . Carcinoma of breast treated with adjuvant chemotherapy ([place])    4 cycles of Carboplatin / Taxol  . Chronic congestive heart failure ([place]) 03/17/2019  . Diarrhea    Sandostatin  . Frequent PVCs 04/01/2019  . Goals of care, counseling/discussion 03/31/2019  . Hypertension   . Morbidly obese ([place])   . S/P radiation therapy 02/04/2013-02/21/2013   Left Breast and Axilla / 46.8 Gy in 26 fractions were planned (she only received 18 Gy in 10 fractions)    Patient  Active Problem List   Diagnosis Date Noted  . Dilated cardiomyopathy ([place]) 04/02/2019  . Hypokalemia 04/02/2019  . Hypertensive heart disease with heart failure ([place]) 04/02/2019  . Frequent PVCs 04/01/2019  . Goals of care, counseling/discussion 03/31/2019  . Chronic systolic heart failure ([place]) 03/17/2019  . Cancer ([place]) 03/03/2015  . Abdominal pain 12/17/2014  . Breast cancer ([place])   . Cancer of upper-outer quadrant of female breast ([place]) 08/01/2012  . Carcinoid tumor of intestine 03/18/2012    Past Surgical History:  Procedure Laterality Date  . BREAST SURGERY  08/23/12   Lt br lumpectomy  . [place] 2007  . COLON SURGERY  2001   Cancer/ Intestinal Resection  . OVARIAN CYST REMOVAL  2001  . PARTIAL MASTECTOMY WITH NEEDLE LOCALIZATION AND AXILLARY SENTINEL LYMPH NODE BX  08/23/2012   Procedure: PARTIAL MASTECTOMY WITH NEEDLE LOCALIZATION AND AXILLARY SENTINEL LYMPH NODE BX;  Surgeon: Adin Hector, MD;  Location: [facility];  Service: General;  Laterality: Left;  blue dye injection   . PORTACATH PLACEMENT  08/23/2012   Procedure: INSERTION PORT-A-CATH;  Surgeon: Adin Hector, MD;  Location: [facility];  Service: General;  Laterality: Right;  intraop ultrasound     OB History   No obstetric history on file.      Home Medications  Prior to Admission medications   Medication Sig Start Date End Date Taking? Authorizing Provider  acetaminophen (TYLENOL) 650 MG CR tablet Take 650 mg by mouth every 8 (eight) hours as needed for pain.   Yes [provider]  furosemide (LASIX) 20 MG tablet Take 1 tablet (20 mg total) by mouth daily. 04/02/19 07/01/19 Yes Richardo Priest, MD  nitroGLYCERIN (NITROSTAT) 0.4 MG SL tablet Place 0.4 mg under the tongue every 5 (five) minutes as needed for chest pain.   Yes [provider]  potassium chloride SA (K-DUR) 20 MEQ tablet Take 1 tablet (20 mEq total) by mouth 2 (two) times daily. 04/02/19  Yes Richardo Priest, MD   sacubitril-valsartan (ENTRESTO) 49-51 MG Take 1 tablet by mouth 2 (two) times daily. 04/02/19  Yes Richardo Priest, MD  spironolactone (ALDACTONE) 25 MG tablet Take 1 tablet (25 mg total) by mouth daily. 04/02/19 07/01/19 Yes Richardo Priest, MD    Family History Family History  Problem Relation Age of Onset  . Heart attack Father   . Diabetes Father   . Hypertension Mother   . Diabetes Mother   . Glaucoma Mother   . Hypertension Brother   . Asthma Brother   . Cancer Paternal Grandmother     Social History Social History   Tobacco Use  . Smoking status: Never Smoker  . Smokeless tobacco: Never Used  . Tobacco comment: NEVER SMOKED  Substance Use Topics  . Alcohol use: No    Alcohol/week: 0.0 standard drinks  . Drug use: No     Allergies   Sulfamethoxazole-trimethoprim and Adhesive [tape]   Review of Systems Review of Systems  All other systems reviewed and are negative.    Physical Exam Updated Vital Signs BP (!) 147/83   Pulse (!) 41   Temp 97.6 F (36.4 C) (Oral)   Resp 15   LMP 08/18/2014 Comment: menopausal: chemo induced  SpO2 97%   Physical Exam Vitals signs and nursing note reviewed.  Constitutional:      Appearance: She is well-developed.  HENT:     Head: Normocephalic.     Comments: Small abrasion to left upper forehead Cardiovascular:     Rate and Rhythm: Normal rate and regular rhythm.     Heart sounds: No murmur.  Pulmonary:     Effort: Pulmonary effort is normal. No respiratory distress.     Breath sounds: Normal breath sounds.  Abdominal:     Palpations: Abdomen is soft.     Tenderness: There is no abdominal tenderness. There is no guarding or rebound.  Musculoskeletal:        General: No swelling or tenderness.  Skin:    General: Skin is warm and dry.     Capillary Refill: Capillary refill takes less than 2 seconds.  Neurological:     Mental Status: She is alert and oriented to person, place, and time.  Psychiatric:        Mood  and Affect: Mood normal.        Behavior: Behavior normal.      ED Treatments / Results  Labs (all labs ordered are listed, but only abnormal results are displayed) Labs Reviewed  COMPREHENSIVE METABOLIC PANEL - Abnormal; Notable for the following components:      Result Value   CO2 21 (*)    Glucose, Bld 121 (*)    Creatinine, Ser 1.42 (*)    Albumin 3.2 (*)    GFR calc non Af Amer 43 (*)  GFR calc Af Amer 50 (*)    All other components within normal limits  CBC WITH DIFFERENTIAL/PLATELET - Abnormal; Notable for the following components:   WBC 11.0 (*)    RBC 5.49 (*)    HCT 47.3 (*)    MCH 25.3 (*)    MCHC 29.4 (*)    RDW 16.2 (*)    Platelets 457 (*)    Neutro Abs 8.6 (*)    All other components within normal limits  MAGNESIUM - Abnormal; Notable for the following components:   Magnesium 1.6 (*)    All other components within normal limits    EKG None ED ECG REPORT   Date: 04/14/2019  Rate: 84  Rhythm: normal sinus rhythm  QRS Axis: normal  Intervals: normal  ST/T Wave abnormalities: normal  Conduction Disutrbances:left bundle branch block  Narrative Interpretation:   Old EKG Reviewed: none available  I have personally reviewed the EKG tracing and agree with the computerized printout as noted.  Radiology Ct Head Wo Contrast  Result Date: 04/14/2019 CLINICAL DATA:  Syncope with fall EXAM: CT HEAD WITHOUT CONTRAST TECHNIQUE: Contiguous axial images were obtained from the base of the skull through the vertex without intravenous contrast. COMPARISON:  None. FINDINGS: Brain: The ventricles are normal in size and configuration. There is no evident intracranial mass, hemorrhage, extra-axial fluid collection, or midline shift. There is slight small vessel disease in the centra semiovale bilaterally. Elsewhere brain parenchyma appears unremarkable. No evident acute infarct. Vascular: No hyperdense vessel.  No evident vascular calcification. Skull: The bony calvarium  appears intact. Sinuses/Orbits: There is mucosal thickening in several ethmoid air cells. There is also mucosal thickening in the posterior left sphenoid sinus region. There is a retention cyst in the right frontal sinus. Orbits appear symmetric bilaterally. Other: Mastoid air cells are clear. IMPRESSION: Mild periventricular small vessel disease. No evident acute infarct. No evident mass or hemorrhage. Foci of paranasal sinus disease noted. Electronically Signed   By: Lowella Grip III M.D.   On: 04/14/2019 18:32    Procedures Procedures (including critical care time)  Medications Ordered in ED Medications  sodium chloride 0.9 % bolus 500 mL (0 mLs Intravenous Stopped 04/14/19 2030)  magnesium sulfate IVPB 2 g 50 mL (0 g Intravenous Stopped 04/14/19 2241)  lactated ringers bolus 1,000 mL (0 mLs Intravenous Stopped 04/14/19 2307)     Initial Impression / Assessment and Plan / ED Course  I have reviewed the triage vital signs and the nursing notes.  Pertinent labs & imaging results that were available during my care of the patient were reviewed by me and considered in my medical decision making (see chart for details).       Patient with history of cardiomyopathy, cancer currently undergoing chemotherapy here for evaluation following a syncopal event. This event happened while she was on the commode attempting to urinate. She does have frequent PVCs on the monitor but no runs VTach. She did recently have a home monitor in place but this was returned earlier today prior to her episode. Magnesium was mildly low in this was repeated. She does have mild elevation and creatinine and she was treated with IV fluid administration. On reassessment in the emergency department she is feeling improved. Plan to discharge home with outpatient follow-up and return precautions.  Final Clinical Impressions(s) / ED Diagnoses   Final diagnoses:  Syncope, unspecified syncope type  Dehydration  Hypomagnesemia     ED Discharge Orders    None  Quintella Reichert, MD 04/14/19 2342

## 2019-04-14 NOTE — ED Notes (Signed)
Patient ambulated approximately 15 feet. Patient denied any dizziness, weakness, or shortness of breath. Patient admitted to feeling fine upon ambulation.

## 2019-04-21 ENCOUNTER — Ambulatory Visit (HOSPITAL_BASED_OUTPATIENT_CLINIC_OR_DEPARTMENT_OTHER)
Admission: RE | Admit: 2019-04-21 | Discharge: 2019-04-21 | Disposition: A | Discharge status: Home / Self Care | Payer: Medicare HMO | Source: Ambulatory Visit | Attending: Hematology & Oncology | Admitting: Hematology & Oncology

## 2019-04-21 ENCOUNTER — Other Ambulatory Visit: Payer: Self-pay

## 2019-04-21 ENCOUNTER — Encounter (HOSPITAL_BASED_OUTPATIENT_CLINIC_OR_DEPARTMENT_OTHER): Payer: Self-pay

## 2019-04-21 DIAGNOSIS — D3A098 Benign carcinoid tumors of other sites: Secondary | ICD-10-CM | POA: Diagnosis not present

## 2019-04-21 DIAGNOSIS — C50411 Malignant neoplasm of upper-outer quadrant of right female breast: Secondary | ICD-10-CM | POA: Diagnosis not present

## 2019-04-21 DIAGNOSIS — R6889 Other general symptoms and signs: Secondary | ICD-10-CM | POA: Diagnosis not present

## 2019-04-21 DIAGNOSIS — C7A019 Malignant carcinoid tumor of the small intestine, unspecified portion: Secondary | ICD-10-CM | POA: Diagnosis not present

## 2019-04-21 MED ORDER — IOHEXOL 300 MG/ML  SOLN
100.0000 mL | Freq: Once | INTRAMUSCULAR | Status: AC | PRN
  Administered 2019-04-21: 80 mL via INTRAVENOUS

## 2019-04-24 ENCOUNTER — Telehealth: Payer: Self-pay | Admitting: Cardiology

## 2019-04-24 NOTE — Telephone Encounter (Signed)
[place]

## 2019-04-25 ENCOUNTER — Other Ambulatory Visit: Payer: Self-pay | Admitting: *Deleted

## 2019-04-25 ENCOUNTER — Encounter: Payer: Self-pay | Admitting: Family

## 2019-04-25 DIAGNOSIS — C50912 Malignant neoplasm of unspecified site of left female breast: Secondary | ICD-10-CM

## 2019-04-28 ENCOUNTER — Inpatient Hospital Stay (HOSPITAL_BASED_OUTPATIENT_CLINIC_OR_DEPARTMENT_OTHER): Payer: Medicare HMO | Admitting: Hematology & Oncology

## 2019-04-28 ENCOUNTER — Ambulatory Visit: Payer: Medicare HMO

## 2019-04-28 ENCOUNTER — Inpatient Hospital Stay: Payer: Medicare HMO

## 2019-04-28 ENCOUNTER — Encounter: Payer: Self-pay | Admitting: Hematology & Oncology

## 2019-04-28 ENCOUNTER — Other Ambulatory Visit: Payer: Self-pay

## 2019-04-28 VITALS — BP 105/65 | HR 104 | Temp 97.3°F | Resp 19 | Wt 236.0 lb

## 2019-04-28 DIAGNOSIS — R000 Tachycardia, unspecified: Secondary | ICD-10-CM | POA: Diagnosis not present

## 2019-04-28 DIAGNOSIS — I447 Left bundle-branch block, unspecified: Secondary | ICD-10-CM | POA: Diagnosis not present

## 2019-04-28 DIAGNOSIS — C50912 Malignant neoplasm of unspecified site of left female breast: Secondary | ICD-10-CM

## 2019-04-28 DIAGNOSIS — R002 Palpitations: Secondary | ICD-10-CM | POA: Diagnosis not present

## 2019-04-28 DIAGNOSIS — C7B04 Secondary carcinoid tumors of peritoneum: Secondary | ICD-10-CM

## 2019-04-28 DIAGNOSIS — I509 Heart failure, unspecified: Secondary | ICD-10-CM

## 2019-04-28 DIAGNOSIS — C7A00 Malignant carcinoid tumor of unspecified site: Secondary | ICD-10-CM | POA: Diagnosis not present

## 2019-04-28 DIAGNOSIS — D3A098 Benign carcinoid tumors of other sites: Secondary | ICD-10-CM

## 2019-04-28 DIAGNOSIS — Z9221 Personal history of antineoplastic chemotherapy: Secondary | ICD-10-CM | POA: Diagnosis not present

## 2019-04-28 DIAGNOSIS — Z171 Estrogen receptor negative status [ER-]: Secondary | ICD-10-CM | POA: Diagnosis not present

## 2019-04-28 DIAGNOSIS — C7B02 Secondary carcinoid tumors of liver: Secondary | ICD-10-CM

## 2019-04-28 DIAGNOSIS — Z79899 Other long term (current) drug therapy: Secondary | ICD-10-CM

## 2019-04-28 LAB — CMP (CANCER CENTER ONLY)
ALT: 24 U/L (ref 0–44)
AST: 18 U/L (ref 15–41)
Albumin: 3.9 g/dL (ref 3.5–5.0)
Alkaline Phosphatase: 100 U/L (ref 38–126)
Anion gap: 11 (ref 5–15)
BUN: 24 mg/dL — ABNORMAL HIGH (ref 6–20)
CO2: 20 mmol/L — ABNORMAL LOW (ref 22–32)
Calcium: 9 mg/dL (ref 8.9–10.3)
Chloride: 105 mmol/L (ref 98–111)
Creatinine: 1.29 mg/dL — ABNORMAL HIGH (ref 0.44–1.00)
GFR, Est AFR Am: 56 mL/min — ABNORMAL LOW (ref >60)
GFR, Estimated: 49 mL/min — ABNORMAL LOW (ref >60)
Glucose, Bld: 129 mg/dL — ABNORMAL HIGH (ref 70–99)
Potassium: 4 mmol/L (ref 3.5–5.1)
Sodium: 136 mmol/L (ref 135–145)
Total Bilirubin: 0.6 mg/dL (ref 0.3–1.2)
Total Protein: 7.6 g/dL (ref 6.5–8.1)

## 2019-04-28 LAB — CBC WITH DIFFERENTIAL (CANCER CENTER ONLY)
Abs Immature Granulocytes: 0.03 10*3/uL (ref 0.00–0.07)
Basophils Absolute: 0.1 10*3/uL (ref 0.0–0.1)
Basophils Relative: 1 %
Eosinophils Absolute: 0.2 10*3/uL (ref 0.0–0.5)
Eosinophils Relative: 2 %
HCT: 46.1 % — ABNORMAL HIGH (ref 36.0–46.0)
Hemoglobin: 13.6 g/dL (ref 12.0–15.0)
Immature Granulocytes: 0 %
Lymphocytes Relative: 26 %
Lymphs Abs: 2.4 10*3/uL (ref 0.7–4.0)
MCH: 25.2 pg — ABNORMAL LOW (ref 26.0–34.0)
MCHC: 29.5 g/dL — ABNORMAL LOW (ref 30.0–36.0)
MCV: 85.4 fL (ref 80.0–100.0)
Monocytes Absolute: 0.6 10*3/uL (ref 0.1–1.0)
Monocytes Relative: 6 %
Neutro Abs: 6.2 10*3/uL (ref 1.7–7.7)
Neutrophils Relative %: 65 %
Platelet Count: 406 10*3/uL — ABNORMAL HIGH (ref 150–400)
RBC: 5.4 MIL/uL — ABNORMAL HIGH (ref 3.87–5.11)
RDW: 18.6 % — ABNORMAL HIGH (ref 11.5–15.5)
WBC Count: 9.4 10*3/uL (ref 4.0–10.5)
nRBC: 0 % (ref 0.0–0.2)

## 2019-04-28 NOTE — Progress Notes (Deleted)
Cardiology Office Note:    Date:  04/28/2019   ID:  Amber Bautista, DOB [birth year], MRN [id number]  PCP:  Shelda Pal, DO  Cardiologist:  Shirlee More, MD    Referring MD: Shelda Pal*    ASSESSMENT:    No diagnosis found. PLAN:    In order of problems listed above:  1. ***   Next appointment: ***   Medication Adjustments/Labs and Tests Ordered: Current medicines are reviewed at length with the patient today.  Concerns regarding medicines are outlined above.  No orders of the defined types were placed in this encounter.  No orders of the defined types were placed in this encounter.   No chief complaint on file.   History of Present Illness:    Amber Bautista is a 48 y.o. female with a hx of chronic systolic heart failure frequent PVCs and metastatic carcinoid tumor with liver metastases and stage IIb ductal carcinoma of the left breast.  She wants last seen 04/02/2019.  She was subsequently seen in the emergency room after syncopal episode was noted to have frequent PVCs and was discharged from the hospital after receiving IV magnesium and IV fluids. Compliance with diet, lifestyle and medications: *** Past Medical History:  Diagnosis Date  . Abdominal pain 12/17/2014  . Breast cancer ([place]) 07/12/12   Stage IIB Ductal Carcinoma to  Upper Outer Quadrant: Left Breast: Triple Negative  . Cancer ([place]) 03/03/2015   Overview:  in intestine-2007, in liver-2007, and left breast-2013  . Cancer of upper-outer quadrant of female breast ([place]) 08/01/2012   Image guided biopsy Oct., 2013 -    2 cm by MRI, solitary;     IDC; TNBC; positive LVI;      . Carcinoid tumor of intestine 2001   Mets 2007  . Carcinoma of breast treated with adjuvant chemotherapy ([place])    4 cycles of Carboplatin / Taxol  . Chronic congestive heart failure ([place]) 03/17/2019  . Diarrhea    Sandostatin  . Frequent PVCs 04/01/2019  . Goals of care, counseling/discussion 03/31/2019  .  Hypertension   . Morbidly obese ([place])   . S/P radiation therapy 02/04/2013-02/21/2013   Left Breast and Axilla / 46.8 Gy in 26 fractions were planned (she only received 18 Gy in 10 fractions)    Past Surgical History:  Procedure Laterality Date  . BREAST SURGERY  08/23/12   Lt br lumpectomy  . [place] 2007  . COLON SURGERY  2001   Cancer/ Intestinal Resection  . OVARIAN CYST REMOVAL  2001  . PARTIAL MASTECTOMY WITH NEEDLE LOCALIZATION AND AXILLARY SENTINEL LYMPH NODE BX  08/23/2012   Procedure: PARTIAL MASTECTOMY WITH NEEDLE LOCALIZATION AND AXILLARY SENTINEL LYMPH NODE BX;  Surgeon: Adin Hector, MD;  Location: [facility];  Service: General;  Laterality: Left;  blue dye injection   . PORTACATH PLACEMENT  08/23/2012   Procedure: INSERTION PORT-A-CATH;  Surgeon: Adin Hector, MD;  Location: [facility];  Service: General;  Laterality: Right;  intraop ultrasound    Current Medications: No outpatient medications have been marked as taking for the 04/29/19 encounter (Appointment) with Richardo Priest, MD.     Allergies:   Sulfamethoxazole-trimethoprim and Adhesive [tape]   Social History   Socioeconomic History  . Marital status: Divorced    Spouse name: Not on file  . Number of children: Not on file  . Years of education: Not on file  . Highest education level: Not on file  Occupational History  . Not on file  Social Needs  . Financial resource strain: Not on file  . Food insecurity    Worry: Not on file    Inability: Not on file  . Transportation needs    Medical: Not on file    Non-medical: Not on file  Tobacco Use  . Smoking status: Never Smoker  . Smokeless tobacco: Never Used  . Tobacco comment: NEVER SMOKED  Substance and Sexual Activity  . Alcohol use: No    Alcohol/week: 0.0 standard drinks  . Drug use: No  . Sexual activity: Not Currently    Comment: menarche age 68, P 3, G 1, 1 misscarriage  Lifestyle  . Physical activity    Days per week: Not  on file    Minutes per session: Not on file  . Stress: Not on file  Relationships  . Social Herbalist on phone: Not on file    Gets together: Not on file    Attends religious service: Not on file    Active member of club or organization: Not on file    Attends meetings of clubs or organizations: Not on file    Relationship status: Not on file  Other Topics Concern  . Not on file  Social History Narrative  . Not on file     Family History: The patient's ***family history includes Asthma in her brother; Cancer in her paternal grandmother; Diabetes in her father and mother; Glaucoma in her mother; Heart attack in her father; Hypertension in her brother and mother. ROS:   Please see the history of present illness.    All other systems reviewed and are negative.  EKGs/Labs/Other Studies Reviewed:    The following studies were reviewed today:  EKG:  EKG ordered today and personally reviewed.  The ekg ordered today demonstrates ***  Recent Labs: 04/09/2019: NT-Pro BNP 469 04/14/2019: Magnesium 1.6 04/28/2019: ALT 24; BUN 24; Creatinine 1.29; Hemoglobin 13.6; Platelet Count 406; Potassium 4.0; Sodium 136  Recent Lipid Panel No results found for: CHOL, TRIG, HDL, CHOLHDL, VLDL, LDLCALC, LDLDIRECT  Physical Exam:    VS:  LMP 08/18/2014 (LMP Unknown) Comment: menopausal: chemo induced    Wt Readings from Last 3 Encounters:  04/28/19 236 lb (107 kg)  04/02/19 248 lb 12.8 oz (112.9 kg)  03/31/19 248 lb (112.5 kg)     GEN: *** Well nourished, well developed in no acute distress HEENT: Normal NECK: No JVD; No carotid bruits LYMPHATICS: No lymphadenopathy CARDIAC: ***RRR, no murmurs, rubs, gallops RESPIRATORY:  Clear to auscultation without rales, wheezing or rhonchi  ABDOMEN: Soft, non-tender, non-distended MUSCULOSKELETAL:  No edema; No deformity  SKIN: Warm and dry NEUROLOGIC:  Alert and oriented x 3 PSYCHIATRIC:  Normal affect    Signed, Shirlee More, MD   04/28/2019 3:39 PM    [facility]

## 2019-04-28 NOTE — Progress Notes (Signed)
Hematology and Oncology Follow Up Visit  OLEAN SANGSTER [id number] 1971-06-21 48 y.o. 04/28/2019   Principle Diagnosis:   Metastatic carcinoid --progressive with liver metastasis  Stage IIb (T2N1M0) ductal carcinoma of the left breast  -- Triple negative  Congestive heart failure  Current Therapy:    Somatuline 120 mg IM monthly     Interim History:  Ms. Nachtigal is back for follow-up.  This is her second office visit.  We saw her back in mid June.  At that time, she had a CT scan done.  This was done on 04/21/2019.  This showed a large mass in the left lobe of the liver.  This measures 8.9 x 7.7 x 2.4 cm.  She has other lesions that are noted in her liver.  Also noted is a nodule along the greater curvature of the stomach measuring 2.1 cm.  She has other peritoneal metastasis.  I have to think that this is all from her neuroendocrine tumor.  However, we may have to think about the possibility of metastatic breast cancer.  However, we saw her, her Chromogranin A level was almost 4900.  We started her on Somatuline 3 weeks ago.  I think we clearly need to get her on some of the more aggressive.  I am going to see if she does not qualify for Lutathera therapy.  She feels tired.  She does not have a lot of energy.  She gets fatigued quite easily.  She has had no vomiting.  She has had occasional diarrhea.  She has had no wheezing.  She had a low the hospital couple weeks ago.  She apparently passed out while going to the bathroom.  I will know if she may have had a vasovagal reaction.  She is had no leg swelling.  She does see cardiology.  She goes back to see them again for her heart.  I do not know if she will need a pacemaker.  Overall, her performance status is ECOG 1.   Medications:  Current Outpatient Medications:  .  acetaminophen (TYLENOL) 650 MG CR tablet, Take 650 mg by mouth every 8 (eight) hours as needed for pain., Disp: , Rfl:  .  furosemide (LASIX) 20 MG tablet,  Take 1 tablet (20 mg total) by mouth daily., Disp: 30 tablet, Rfl: 0 .  nitroGLYCERIN (NITROSTAT) 0.4 MG SL tablet, Place 0.4 mg under the tongue every 5 (five) minutes as needed for chest pain., Disp: , Rfl:  .  potassium chloride SA (K-DUR) 20 MEQ tablet, Take 1 tablet (20 mEq total) by mouth 2 (two) times daily., Disp: 60 tablet, Rfl: 0 .  sacubitril-valsartan (ENTRESTO) 49-51 MG, Take 1 tablet by mouth 2 (two) times daily., Disp: 60 tablet, Rfl: 2 .  spironolactone (ALDACTONE) 25 MG tablet, Take 1 tablet (25 mg total) by mouth daily., Disp: 30 tablet, Rfl: 0  Allergies:  Allergies  Allergen Reactions  . Sulfamethoxazole-Trimethoprim Swelling and Hypertension    Swelling and hypertension after taking, was admitted to hospital  . Adhesive [Tape] Rash and Other (See Comments)    Causes SEVERE RASHES AND BLISTERS    Past Medical History, Surgical history, Social history, and Family History were reviewed and updated.  Review of Systems: Review of Systems  Constitutional: Positive for fatigue.  HENT:  Negative.   Eyes: Negative.   Respiratory: Positive for shortness of breath.   Cardiovascular: Positive for palpitations.  Gastrointestinal: Positive for diarrhea and nausea.  Endocrine: Negative.   Genitourinary: Negative.  Musculoskeletal: Positive for back pain and myalgias.  Skin: Negative.   Neurological: Positive for light-headedness. Negative for numbness.  Hematological: Negative.   Psychiatric/Behavioral: Negative.     Physical Exam:  weight is 236 lb (107 kg). Her oral temperature is 97.3 F (36.3 C) (abnormal). Her blood pressure is 105/65 and her pulse is 104 (abnormal). Her respiration is 19 and oxygen saturation is 98%.   Wt Readings from Last 3 Encounters:  04/28/19 236 lb (107 kg)  04/02/19 248 lb 12.8 oz (112.9 kg)  03/31/19 248 lb (112.5 kg)    Physical Exam Vitals signs reviewed.  HENT:     Head: Normocephalic and atraumatic.  Eyes:     Pupils: Pupils  are equal, round, and reactive to light.  Neck:     Musculoskeletal: Normal range of motion.  Cardiovascular:     Rate and Rhythm: Normal rate and regular rhythm.     Heart sounds: Normal heart sounds.  Pulmonary:     Effort: Pulmonary effort is normal.     Breath sounds: Normal breath sounds.  Abdominal:     General: Bowel sounds are normal.     Palpations: Abdomen is soft.  Musculoskeletal: Normal range of motion.        General: No tenderness or deformity.  Lymphadenopathy:     Cervical: No cervical adenopathy.  Skin:    General: Skin is warm and dry.     Findings: No erythema or rash.  Neurological:     Mental Status: She is alert and oriented to person, place, and time.  Psychiatric:        Behavior: Behavior normal.        Thought Content: Thought content normal.        Judgment: Judgment normal.      Lab Results  Component Value Date   WBC 9.4 04/28/2019   HGB 13.6 04/28/2019   HCT 46.1 (H) 04/28/2019   MCV 85.4 04/28/2019   PLT 406 (H) 04/28/2019     Chemistry      Component Value Date/Time   NA 136 04/28/2019 1341   NA 135 04/09/2019 1458   NA 138 11/14/2012 0926   K 4.0 04/28/2019 1341   K 2.7 (LL) 11/14/2012 0926   CL 105 04/28/2019 1341   CL 96 (L) 11/14/2012 0926   CO2 20 (L) 04/28/2019 1341   CO2 29 11/14/2012 0926   BUN 24 (H) 04/28/2019 1341   BUN 18 04/09/2019 1458   BUN 19 11/14/2012 0926   CREATININE 1.29 (H) 04/28/2019 1341   CREATININE 1.3 (H) 11/14/2012 0926      Component Value Date/Time   CALCIUM 9.0 04/28/2019 1341   CALCIUM 9.5 11/14/2012 0926   ALKPHOS 100 04/28/2019 1341   ALKPHOS 61 11/14/2012 0926   AST 18 04/28/2019 1341   ALT 24 04/28/2019 1341   ALT 20 11/14/2012 0926   BILITOT 0.6 04/28/2019 1341       Impression and Plan: Ms. Livingston is a 48 year old Hispanic female with a past history of infiltrating ductal carcinoma of the left breast.  This was about 8 years ago.  I really do not think this is the issue right now.   By the Chromogranin A value of 4900, I have to believe that her carcinoid is the issue.  I know that she has any type of carcinoid syndrome although she certainly is at risk for this given her extensive liver disease.  I think we have to be aggressive with this.  I know she is on Somatuline.  I think we should consider her for Lutathera intervention.  We will have to get a dotatate PET scan on her.  I am sure that her insurance company will not want to do this.  I really would like to avoid chemotherapy for this carcinoid.  Hopefully, since she has not had any real sustained therapy for the carcinoid for 4 years, get her back on the Somatuline will help a little bit.  Otherwise she has cardiac issues.  Congestive heart failure is always a consideration.  I spent about 40 minutes with her today.  This is very complicated given that we have not seen her for several years.  She is up in [place].  She now is back in town.  We will try to get the nuclear medicine scan in the next week or so.  I put in a referral for interventional radiology to see her for the Lutathera evaluation.  I will plan to see her back in another month.  She will back next week for Somatuline.   Volanda Napoleon, MD 7/20/20205:27 PM

## 2019-04-29 ENCOUNTER — Ambulatory Visit: Payer: Medicare HMO | Admitting: Cardiology

## 2019-04-29 LAB — IRON AND TIBC
Iron: 58 ug/dL (ref 41–142)
Saturation Ratios: 14 % — ABNORMAL LOW (ref 21–57)
TIBC: 411 ug/dL (ref 236–444)
UIBC: 353 ug/dL (ref 120–384)

## 2019-04-29 LAB — FERRITIN: Ferritin: 234 ng/mL (ref 11–307)

## 2019-04-29 LAB — LACTATE DEHYDROGENASE: LDH: 204 U/L — ABNORMAL HIGH (ref 98–192)

## 2019-04-30 ENCOUNTER — Telehealth: Payer: Self-pay | Admitting: Hematology & Oncology

## 2019-04-30 NOTE — Telephone Encounter (Signed)
Called and spoke with patient regarding appointment added for 7/29 per 7/21 result note .  She was ok with adding infusion after injection on same day.

## 2019-05-04 ENCOUNTER — Encounter: Payer: Self-pay | Admitting: Family Medicine

## 2019-05-05 ENCOUNTER — Encounter: Payer: Self-pay | Admitting: Hematology & Oncology

## 2019-05-07 ENCOUNTER — Encounter: Payer: Self-pay | Admitting: Family Medicine

## 2019-05-07 ENCOUNTER — Inpatient Hospital Stay: Payer: Medicare HMO

## 2019-05-07 ENCOUNTER — Other Ambulatory Visit: Payer: Self-pay

## 2019-05-07 ENCOUNTER — Inpatient Hospital Stay (HOSPITAL_BASED_OUTPATIENT_CLINIC_OR_DEPARTMENT_OTHER): Payer: Medicare HMO | Admitting: Hematology & Oncology

## 2019-05-07 ENCOUNTER — Ambulatory Visit (INDEPENDENT_AMBULATORY_CARE_PROVIDER_SITE_OTHER): Payer: Medicare HMO | Admitting: Family Medicine

## 2019-05-07 ENCOUNTER — Other Ambulatory Visit (HOSPITAL_BASED_OUTPATIENT_CLINIC_OR_DEPARTMENT_OTHER): Payer: Self-pay

## 2019-05-07 ENCOUNTER — Other Ambulatory Visit: Payer: Self-pay | Admitting: *Deleted

## 2019-05-07 VITALS — BP 108/68 | HR 61 | Temp 97.8°F | Ht 66.0 in | Wt 231.0 lb

## 2019-05-07 VITALS — BP 121/96 | HR 125 | Temp 97.0°F | Resp 20

## 2019-05-07 DIAGNOSIS — R000 Tachycardia, unspecified: Secondary | ICD-10-CM

## 2019-05-07 DIAGNOSIS — R008 Other abnormalities of heart beat: Secondary | ICD-10-CM

## 2019-05-07 DIAGNOSIS — C7A00 Malignant carcinoid tumor of unspecified site: Secondary | ICD-10-CM

## 2019-05-07 DIAGNOSIS — K644 Residual hemorrhoidal skin tags: Secondary | ICD-10-CM

## 2019-05-07 DIAGNOSIS — Z79899 Other long term (current) drug therapy: Secondary | ICD-10-CM | POA: Diagnosis not present

## 2019-05-07 DIAGNOSIS — Z9221 Personal history of antineoplastic chemotherapy: Secondary | ICD-10-CM | POA: Diagnosis not present

## 2019-05-07 DIAGNOSIS — C50912 Malignant neoplasm of unspecified site of left female breast: Secondary | ICD-10-CM

## 2019-05-07 DIAGNOSIS — D3A098 Benign carcinoid tumors of other sites: Secondary | ICD-10-CM

## 2019-05-07 DIAGNOSIS — I509 Heart failure, unspecified: Secondary | ICD-10-CM | POA: Diagnosis not present

## 2019-05-07 DIAGNOSIS — I447 Left bundle-branch block, unspecified: Secondary | ICD-10-CM

## 2019-05-07 DIAGNOSIS — Z171 Estrogen receptor negative status [ER-]: Secondary | ICD-10-CM | POA: Diagnosis not present

## 2019-05-07 DIAGNOSIS — C7B02 Secondary carcinoid tumors of liver: Secondary | ICD-10-CM | POA: Diagnosis not present

## 2019-05-07 DIAGNOSIS — C7B04 Secondary carcinoid tumors of peritoneum: Secondary | ICD-10-CM | POA: Diagnosis not present

## 2019-05-07 DIAGNOSIS — I420 Dilated cardiomyopathy: Secondary | ICD-10-CM

## 2019-05-07 LAB — CBC WITH DIFFERENTIAL (CANCER CENTER ONLY)
Abs Immature Granulocytes: 0.04 10*3/uL (ref 0.00–0.07)
Basophils Absolute: 0.1 10*3/uL (ref 0.0–0.1)
Basophils Relative: 1 %
Eosinophils Absolute: 0.2 10*3/uL (ref 0.0–0.5)
Eosinophils Relative: 2 %
HCT: 47.7 % — ABNORMAL HIGH (ref 36.0–46.0)
Hemoglobin: 14.5 g/dL (ref 12.0–15.0)
Immature Granulocytes: 0 %
Lymphocytes Relative: 25 %
Lymphs Abs: 2.6 10*3/uL (ref 0.7–4.0)
MCH: 25.7 pg — ABNORMAL LOW (ref 26.0–34.0)
MCHC: 30.4 g/dL (ref 30.0–36.0)
MCV: 84.4 fL (ref 80.0–100.0)
Monocytes Absolute: 0.7 10*3/uL (ref 0.1–1.0)
Monocytes Relative: 7 %
Neutro Abs: 6.9 10*3/uL (ref 1.7–7.7)
Neutrophils Relative %: 65 %
Platelet Count: 439 10*3/uL — ABNORMAL HIGH (ref 150–400)
RBC: 5.65 MIL/uL — ABNORMAL HIGH (ref 3.87–5.11)
RDW: 19.7 % — ABNORMAL HIGH (ref 11.5–15.5)
WBC Count: 10.5 10*3/uL (ref 4.0–10.5)
nRBC: 0 % (ref 0.0–0.2)

## 2019-05-07 LAB — COMPREHENSIVE METABOLIC PANEL
ALT: 25 U/L (ref 0–44)
AST: 20 U/L (ref 15–41)
Albumin: 3.7 g/dL (ref 3.5–5.0)
Alkaline Phosphatase: 103 U/L (ref 38–126)
Anion gap: 15 (ref 5–15)
BUN: 21 mg/dL — ABNORMAL HIGH (ref 6–20)
CO2: 18 mmol/L — ABNORMAL LOW (ref 22–32)
Calcium: 9.2 mg/dL (ref 8.9–10.3)
Chloride: 108 mmol/L (ref 98–111)
Creatinine, Ser: 1.47 mg/dL — ABNORMAL HIGH (ref 0.44–1.00)
GFR calc Af Amer: 48 mL/min — ABNORMAL LOW (ref >60)
GFR calc non Af Amer: 41 mL/min — ABNORMAL LOW (ref >60)
Glucose, Bld: 124 mg/dL — ABNORMAL HIGH (ref 70–99)
Potassium: 3.3 mmol/L — ABNORMAL LOW (ref 3.5–5.1)
Sodium: 141 mmol/L (ref 135–145)
Total Bilirubin: 0.6 mg/dL (ref 0.3–1.2)
Total Protein: 7.6 g/dL (ref 6.5–8.1)

## 2019-05-07 MED ORDER — LANREOTIDE ACETATE 120 MG/0.5ML ~~LOC~~ SOLN
120.0000 mg | Freq: Once | SUBCUTANEOUS | Status: AC
  Administered 2019-05-07: 120 mg via SUBCUTANEOUS

## 2019-05-07 MED ORDER — LANREOTIDE ACETATE 120 MG/0.5ML ~~LOC~~ SOLN
SUBCUTANEOUS | Status: AC
  Filled 2019-05-07: qty 120

## 2019-05-07 MED ORDER — HYDROCORT-PRAMOXINE (PERIANAL) 1-1 % EX FOAM: 1.0000 | Freq: Two times a day (BID) | CUTANEOUS | 2 refills | Status: DC

## 2019-05-07 MED ORDER — HYDROCORTISONE (PERIANAL) 2.5 % EX CREA: 1.0000 "application " | TOPICAL_CREAM | Freq: Two times a day (BID) | CUTANEOUS | 0 refills | Status: DC

## 2019-05-07 MED ORDER — SODIUM CHLORIDE 0.9 % IV SOLN
510.0000 mg | Freq: Once | INTRAVENOUS | Status: AC
  Administered 2019-05-07: 510 mg via INTRAVENOUS
  Filled 2019-05-07: qty 510

## 2019-05-07 MED FILL — PROCTOZONE-HC 2.5 % CREA: 2.5 | 15 days supply | Qty: 30 | Fill #0

## 2019-05-07 NOTE — Progress Notes (Signed)
Hematology and Oncology Follow Up Visit  Amber Bautista [id number] [birth year] 48 y.o. 05/07/2019   Principle Diagnosis:   Metastatic carcinoid --progressive with liver metastasis  Stage IIb (T2N1M0) ductal carcinoma of the left breast  -- Triple negative  Congestive heart failure  Current Therapy:    Somatuline 120 mg IM monthly     Interim History:  Amber Bautista is back for an unscheduled visit.  She was in for her Somatuline injection also iron.  She was not feeling well at all.  She really has been having problems with her heart recently.  She actually had to go to the emergency room a week or so ago because she had a syncopal episode in the bathroom..  She says that she can walk only about 10 feet or so before she has to stop because she gets tired and short of breath..  She is quite tachycardic.  We did get an EKG on her.  This shows sinus tachycardia with left bundle branch block.  She has bigeminy.  I got 1 of our cardiologist on her floor to come over and look at the EKG.  He is going to set her up with an echocardiogram.  He or his partner are going to see her soon.  It sounds like she is going to need a pacemaker or possibly even a defibrillator.  We got lab work on her today.  Her lab work looks that bad at all.  She does not have any real electrolyte abnormalities.  She is not anemic.  Maybe, the IV iron will help her a little bit.  She has had no leg swelling.  She has had no rashes.  She has had no fever.  Overall, her performance status is ECOG 1.   Medications:  Current Outpatient Medications:  .  acetaminophen (TYLENOL) 650 MG CR tablet, Take 650 mg by mouth every 8 (eight) hours as needed for pain., Disp: , Rfl:  .  furosemide (LASIX) 20 MG tablet, Take 1 tablet (20 mg total) by mouth daily., Disp: 30 tablet, Rfl: 0 .  nitroGLYCERIN (NITROSTAT) 0.4 MG SL tablet, Place 0.4 mg under the tongue every 5 (five) minutes as needed for chest pain., Disp: , Rfl:  .   potassium chloride SA (K-DUR) 20 MEQ tablet, Take 1 tablet (20 mEq total) by mouth 2 (two) times daily., Disp: 60 tablet, Rfl: 0 .  sacubitril-valsartan (ENTRESTO) 49-51 MG, Take 1 tablet by mouth 2 (two) times daily., Disp: 60 tablet, Rfl: 2 .  spironolactone (ALDACTONE) 25 MG tablet, Take 1 tablet (25 mg total) by mouth daily., Disp: 30 tablet, Rfl: 0  Allergies:  Allergies  Allergen Reactions  . Sulfamethoxazole-Trimethoprim Swelling and Hypertension    Swelling and hypertension after taking, was admitted to hospital  . Adhesive [Tape] Rash and Other (See Comments)    Causes SEVERE RASHES AND BLISTERS    Past Medical History, Surgical history, Social history, and Family History were reviewed and updated.  Review of Systems: Review of Systems  Constitutional: Positive for fatigue.  HENT:  Negative.   Eyes: Negative.   Respiratory: Positive for shortness of breath.   Cardiovascular: Positive for palpitations.  Gastrointestinal: Positive for diarrhea and nausea.  Endocrine: Negative.   Genitourinary: Negative.    Musculoskeletal: Positive for back pain and myalgias.  Skin: Negative.   Neurological: Positive for light-headedness. Negative for numbness.  Hematological: Negative.   Psychiatric/Behavioral: Negative.     Physical Exam:  vitals were not taken for this  visit.   Wt Readings from Last 3 Encounters:  05/07/19 231 lb (104.8 kg)  04/28/19 236 lb (107 kg)  04/02/19 248 lb 12.8 oz (112.9 kg)    Physical Exam Vitals signs reviewed.  HENT:     Head: Normocephalic and atraumatic.  Eyes:     Pupils: Pupils are equal, round, and reactive to light.  Neck:     Musculoskeletal: Normal range of motion.  Cardiovascular:     Rate and Rhythm: Normal rate and regular rhythm.     Heart sounds: Normal heart sounds.  Pulmonary:     Effort: Pulmonary effort is normal.     Breath sounds: Normal breath sounds.  Abdominal:     General: Bowel sounds are normal.     Palpations:  Abdomen is soft.  Musculoskeletal: Normal range of motion.        General: No tenderness or deformity.  Lymphadenopathy:     Cervical: No cervical adenopathy.  Skin:    General: Skin is warm and dry.     Findings: No erythema or rash.  Neurological:     Mental Status: She is alert and oriented to person, place, and time.  Psychiatric:        Behavior: Behavior normal.        Thought Content: Thought content normal.        Judgment: Judgment normal.      Lab Results  Component Value Date   WBC 10.5 05/07/2019   HGB 14.5 05/07/2019   HCT 47.7 (H) 05/07/2019   MCV 84.4 05/07/2019   PLT 439 (H) 05/07/2019     Chemistry      Component Value Date/Time   NA 141 05/07/2019 1202   NA 135 04/09/2019 1458   NA 138 11/14/2012 0926   K 3.3 (L) 05/07/2019 1202   K 2.7 (LL) 11/14/2012 0926   CL 108 05/07/2019 1202   CL 96 (L) 11/14/2012 0926   CO2 18 (L) 05/07/2019 1202   CO2 29 11/14/2012 0926   BUN 21 (H) 05/07/2019 1202   BUN 18 04/09/2019 1458   BUN 19 11/14/2012 0926   CREATININE 1.47 (H) 05/07/2019 1202   CREATININE 1.29 (H) 04/28/2019 1341   CREATININE 1.3 (H) 11/14/2012 0926      Component Value Date/Time   CALCIUM 9.2 05/07/2019 1202   CALCIUM 9.5 11/14/2012 0926   ALKPHOS 103 05/07/2019 1202   ALKPHOS 61 11/14/2012 0926   AST 20 05/07/2019 1202   AST 18 04/28/2019 1341   ALT 25 05/07/2019 1202   ALT 24 04/28/2019 1341   ALT 20 11/14/2012 0926   BILITOT 0.6 05/07/2019 1202   BILITOT 0.6 04/28/2019 1341       Impression and Plan: Amber Bautista is a 48 year old Hispanic female with a past history of infiltrating ductal carcinoma of the left breast.  This was about 8 years ago.  I really do not think this is the issue right now.  By the Chromogranin A value of 4900, I have to believe that her carcinoid is the issue.  I know that she has any type of carcinoid syndrome although she certainly is at risk for this given her extensive liver disease.  I think we have to  be aggressive with this.  I know she is on Somatuline.  I think we should consider her for Lutathera intervention.  We will have to get a dotatate PET scan on her.  I think that she is set up for this next week.  I just worry about her cardiac status right now.  I am sure that cardiology will help get her cardiac status optimized a little bit better.  We spent a good 45 minutes with Amber Bautista.  This is quite complicated.  I suspect that she probably has cardiomyopathy from past chemotherapy which she received for breast cancer about 8 years ago.  We does want her quality of life to be a lot better.  I think if her cardiac status can improve, this will get better.   Volanda Napoleon, MD 7/29/20203:36 PM

## 2019-05-07 NOTE — Progress Notes (Signed)
Chief Complaint  Patient presents with  . Hemorrhoids    Subjective: Patient is a 48 y.o. female here for hemorrhoids.  1 mo ago it started, tried numerous OTC creams w/o relief. Hx of hemorrhoids in past, had to have surgery before. She is not having abd pain or bleeding. Has run cold water over and dabbed while cleaning herself that seems to be less aggravating.    ROS: GI: As noted in HPI  Past Medical History:  Diagnosis Date  . Abdominal pain 12/17/2014  . Breast cancer ([place]) 07/12/12   Stage IIB Ductal Carcinoma to  Upper Outer Quadrant: Left Breast: Triple Negative  . Cancer ([place]) 03/03/2015   Overview:  in intestine-2007, in liver-2007, and left breast-2013  . Cancer of upper-outer quadrant of female breast ([place]) 08/01/2012   Image guided biopsy Oct., 2013 -    2 cm by MRI, solitary;     IDC; TNBC; positive LVI;      . Carcinoid tumor of intestine 2001   Mets 2007  . Carcinoma of breast treated with adjuvant chemotherapy ([place])    4 cycles of Carboplatin / Taxol  . Chronic congestive heart failure ([place]) 03/17/2019  . Diarrhea    Sandostatin  . Frequent PVCs 04/01/2019  . Goals of care, counseling/discussion 03/31/2019  . Hypertension   . Morbidly obese ([place])   . S/P radiation therapy 02/04/2013-02/21/2013   Left Breast and Axilla / 46.8 Gy in 26 fractions were planned (she only received 18 Gy in 10 fractions)    Objective: BP 108/68 (BP Location: Left Arm, Patient Position: Sitting, Cuff Size: Large)   Pulse 61   Temp 97.8 F (36.6 C) (Oral)   Ht 5\' 6"  (1.676 m)   Wt 231 lb (104.8 kg)   LMP 08/18/2014 (LMP Unknown) Comment: menopausal: chemo induced  SpO2 93%   BMI 37.28 kg/m  General: Awake, appears stated age HEENT: MMM, EOMi Rectal: Declined DRE; externally no tears, there is a ext hemorrhoid noted antero-laterally on R Lungs: No accessory muscle use Psych: Age appropriate judgment and insight, normal affect and mood  Assessment and Plan: External hemorrhoids  - Plan: hydrocortisone-pramoxine (PROCTOFOAM-HC) rectal foam, hydrocortisone (ANUSOL-HC) 2.5 % rectal cream, Former is first option, latter if former not covered. Use soft toilet tissue, soaks, let us know if not improving. Doubt nitro topical would be covered if above is not. Would refer to GI.  The patient voiced understanding and agreement to the plan.  [place], DO 05/07/19  3:52 PM

## 2019-05-07 NOTE — Patient Instructions (Addendum)
Stay hydrated.   Use soft toilet tissue.  Make sure you aren't constipated.  Sitz baths/soaks can be helpful.   Let me know if we aren't improving.   Let us know if you need anything.

## 2019-05-11 ENCOUNTER — Encounter: Payer: Self-pay | Admitting: Family Medicine

## 2019-05-12 ENCOUNTER — Other Ambulatory Visit: Payer: Self-pay | Admitting: Family Medicine

## 2019-05-12 DIAGNOSIS — K644 Residual hemorrhoidal skin tags: Secondary | ICD-10-CM

## 2019-05-12 MED ORDER — NITROGLYCERIN 0.4 % RE OINT: TOPICAL_OINTMENT | RECTAL | 0 refills | Status: DC

## 2019-05-12 MED FILL — RECTIV 0.4% OINTMENT: 0.4 | 30 days supply | Qty: 30 | Fill #0

## 2019-05-14 ENCOUNTER — Other Ambulatory Visit (HOSPITAL_COMMUNITY): Payer: Self-pay | Admitting: Hematology & Oncology

## 2019-05-14 ENCOUNTER — Other Ambulatory Visit: Payer: Self-pay

## 2019-05-14 ENCOUNTER — Ambulatory Visit (HOSPITAL_COMMUNITY)
Admission: RE | Admit: 2019-05-14 | Discharge: 2019-05-14 | Disposition: A | Discharge status: Home / Self Care | Payer: Medicare HMO | Source: Ambulatory Visit | Attending: Hematology & Oncology | Admitting: Hematology & Oncology

## 2019-05-14 DIAGNOSIS — C7A8 Other malignant neuroendocrine tumors: Secondary | ICD-10-CM

## 2019-05-14 DIAGNOSIS — R6889 Other general symptoms and signs: Secondary | ICD-10-CM | POA: Diagnosis not present

## 2019-05-14 DIAGNOSIS — C7A092 Malignant carcinoid tumor of the stomach: Secondary | ICD-10-CM | POA: Diagnosis not present

## 2019-05-14 DIAGNOSIS — C772 Secondary and unspecified malignant neoplasm of intra-abdominal lymph nodes: Secondary | ICD-10-CM | POA: Diagnosis not present

## 2019-05-14 DIAGNOSIS — C781 Secondary malignant neoplasm of mediastinum: Secondary | ICD-10-CM | POA: Diagnosis not present

## 2019-05-14 DIAGNOSIS — D3A098 Benign carcinoid tumors of other sites: Secondary | ICD-10-CM

## 2019-05-14 MED ORDER — GALLIUM GA 68 DOTATATE IV KIT: 5.4000 | PACK | Freq: Once | INTRAVENOUS | Status: DC | PRN

## 2019-05-16 ENCOUNTER — Ambulatory Visit (HOSPITAL_BASED_OUTPATIENT_CLINIC_OR_DEPARTMENT_OTHER): Admission: RE | Admit: 2019-05-16 | Payer: Medicare HMO | Source: Ambulatory Visit

## 2019-05-19 NOTE — Progress Notes (Signed)
Cardiology Office Note:    Date:  05/20/2019   ID:  Amber Bautista, DOB 01-29-1971, MRN [id number]  PCP:  Shelda Pal, DO  Cardiologist:  Shirlee More, MD    Referring MD: Shelda Pal*    ASSESSMENT:    1. Chronic systolic heart failure ([place])   2. Dilated cardiomyopathy ([place])   3. Frequent PVCs    PLAN:    In order of problems listed above:  1. At this time she is not fluid overloaded but is severely symptomatic functional class III or IV pressure echocardiogram forward in the next day or 2 continue Entresto resume the beta-blocker and not sure why it was stopped and continue her diuretic and MRA.  Recheck magnesium potassium proBNP 2. Continue guideline directed therapy recheck echo 3. Restart beta-blocker if EF is severely reduced refer to EP with high PVC burden and syncope   Next appointment: 2 to 3 weeks   Medication Adjustments/Labs and Tests Ordered: Current medicines are reviewed at length with the patient today.  Concerns regarding medicines are outlined above.  Orders Placed This Encounter  Procedures  . Basic metabolic panel  . Pro b natriuretic peptide (BNP)  . Magnesium   Meds ordered this encounter  Medications  . metoprolol succinate (TOPROL-XL) 50 MG 24 hr tablet    Sig: Take 1 tablet (50 mg total) by mouth daily. Take with or immediately following a meal.    Dispense:  90 tablet    Refill:  1    Chief Complaint  Patient presents with  . Follow-up    PVC's  . Congestive Heart Failure    History of Present Illness:    Amber Bautista is a 48 y.o. female with a hx of heart failure, nonischemic cardiomyopathy, PVCs, HTN, metastatic carcinoid with liver metastasis, stage IIb ductal carcinoma of L breast (s/p adjuvant hemotherapy with 4 cycles of carboplatinum/Taxotere) last seen 04/02/19. She was [facility] ED 04/14/2019 with syncope.Her EKG showed [place] with LBBB.  Echo 08/2012 LVEF 25-30%, mild LA enlargement, mild MR, severe  LV dilation. Seen by cardiology at [facility] underwent heart cath showing "very weak heart muscle and normal coronary arteries".   When last seen she noted awareness of her heart beating and marked exercise intolerance. 7 day Zio 05/02/19 showed SR with frequent (7.4%) ventricular ectopy.   Compliance with diet, lifestyle and medications: Yes  She is not doing well she has profound exercise intolerance and exertional dyspnea but on the other hand has no edema orthopnea.  Is very emotionally distressed by her recurrent cancer and the need for treatment and she is pending her cardiac echocardiogram.  I am unsure whether the emergency room beta-blocker is stopped will resume it and she was hypokalemic, low magnesium and I will recheck those labs today.  We will see her back in the office in 2 to 3 weeks if her EF is this severely reduced and with her episode of syncope we may need to consider referral to EP for consideration of an ICD for EP testing. Past Medical History:  Diagnosis Date  . Abdominal pain 12/17/2014  . Breast cancer ([place]) 07/12/12   Stage IIB Ductal Carcinoma to  Upper Outer Quadrant: Left Breast: Triple Negative  . Cancer ([place]) 03/03/2015   Overview:  in intestine-2007, in liver-2007, and left breast-2013  . Cancer of upper-outer quadrant of female breast ([place]) 08/01/2012   Image guided biopsy Oct., 2013 -    2 cm by  MRI, solitary;     IDC; TNBC; positive LVI;      . Carcinoid tumor of intestine 2001   Mets 2007  . Carcinoma of breast treated with adjuvant chemotherapy ([place])    4 cycles of Carboplatin / Taxol  . Chronic congestive heart failure ([place]) 03/17/2019  . Diarrhea    Sandostatin  . Frequent PVCs 04/01/2019  . Goals of care, counseling/discussion 03/31/2019  . Hypertension   . Morbidly obese ([place])   . S/P radiation therapy 02/04/2013-02/21/2013   Left Breast and Axilla / 46.8 Gy in 26 fractions were planned (she only received 18 Gy in 10 fractions)    Past  Surgical History:  Procedure Laterality Date  . BREAST SURGERY  08/23/12   Lt br lumpectomy  . [place] 2007  . COLON SURGERY  2001   Cancer/ Intestinal Resection  . OVARIAN CYST REMOVAL  2001  . PARTIAL MASTECTOMY WITH NEEDLE LOCALIZATION AND AXILLARY SENTINEL LYMPH NODE BX  08/23/2012   Procedure: PARTIAL MASTECTOMY WITH NEEDLE LOCALIZATION AND AXILLARY SENTINEL LYMPH NODE BX;  Surgeon: Adin Hector, MD;  Location: [facility];  Service: General;  Laterality: Left;  blue dye injection   . PORTACATH PLACEMENT  08/23/2012   Procedure: INSERTION PORT-A-CATH;  Surgeon: Adin Hector, MD;  Location: [facility];  Service: General;  Laterality: Right;  intraop ultrasound    Current Medications: Current Meds  Medication Sig  . acetaminophen (TYLENOL) 650 MG CR tablet Take 650 mg by mouth every 8 (eight) hours as needed for pain.  . ferrous sulfate 324 MG TBEC Take 324 mg by mouth daily.  . furosemide (LASIX) 20 MG tablet Take 1 tablet (20 mg total) by mouth daily.  . nitroGLYCERIN (NITROSTAT) 0.4 MG SL tablet Place 0.4 mg under the tongue every 5 (five) minutes as needed for chest pain.  . potassium chloride SA (K-DUR) 20 MEQ tablet Take 1 tablet (20 mEq total) by mouth 2 (two) times daily.  . sacubitril-valsartan (ENTRESTO) 49-51 MG Take 1 tablet by mouth 2 (two) times daily.  Marland Kitchen spironolactone (ALDACTONE) 25 MG tablet Take 1 tablet (25 mg total) by mouth daily.     Allergies:   Sulfamethoxazole-trimethoprim and Adhesive [tape]   Social History   Socioeconomic History  . Marital status: Divorced    Spouse name: Not on file  . Number of children: Not on file  . Years of education: Not on file  . Highest education level: Not on file  Occupational History  . Not on file  Social Needs  . Financial resource strain: Not on file  . Food insecurity    Worry: Not on file    Inability: Not on file  . Transportation needs    Medical: Not on file    Non-medical: Not on file   Tobacco Use  . Smoking status: Never Smoker  . Smokeless tobacco: Never Used  . Tobacco comment: NEVER SMOKED  Substance and Sexual Activity  . Alcohol use: No    Alcohol/week: 0.0 standard drinks  . Drug use: No  . Sexual activity: Not Currently    Comment: menarche age 57, P 3, G 1, 1 misscarriage  Lifestyle  . Physical activity    Days per week: Not on file    Minutes per session: Not on file  . Stress: Not on file  Relationships  . Social Herbalist on phone: Not on file    Gets together: Not on file  Attends religious service: Not on file    Active member of club or organization: Not on file    Attends meetings of clubs or organizations: Not on file    Relationship status: Not on file  Other Topics Concern  . Not on file  Social History Narrative  . Not on file     Family History: The patient's family history includes Asthma in her brother; Cancer in her paternal grandmother; Diabetes in her father and mother; Glaucoma in her mother; Heart attack in her father; Hypertension in her brother and mother. ROS:   Please see the history of present illness.    All other systems reviewed and are negative.  EKGs/Labs/Other Studies Reviewed:    The following studies were reviewed today: 05/02/19 Zio Monitor A ZIO monitor was used for 7 days beginning 04/07/2019 to assess ventricular ectopy in the setting of dilated cardiomyopathy. The rhythm was sinus throughout the recording with minimum average and maximum heart rates of 58, 93 and 131 bpm. There were no pauses of 3 seconds or greater.  There were no triggered or diary events. Ventricular ectopy was frequent 7.4% incidence of isolated PVCs less than 1% incidence of couplets and triplets.  The longest episode of bigeminy 23 minutes 28 seconds in the longest episode of trigeminy 1 minute 26 seconds. Conclusion frequent ventricular ectopy   Recent Labs: 04/09/2019: NT-Pro BNP 469 04/14/2019: Magnesium 1.6 05/07/2019:  ALT 25; BUN 21; Creatinine, Ser 1.47; Hemoglobin 14.5; Platelet Count 439; Potassium 3.3; Sodium 141  Recent Lipid Panel No results found for: CHOL, TRIG, HDL, CHOLHDL, VLDL, LDLCALC, LDLDIRECT  Physical Exam:    VS:  BP 140/82 (BP Location: Right Arm, Patient Position: Sitting, Cuff Size: Large)   Pulse 84   Ht 5\' 6"  (1.676 m)   Wt 230 lb (104.3 kg)   LMP 08/18/2014 (LMP Unknown) Comment: menopausal: chemo induced  SpO2 98%   BMI 37.12 kg/m     Wt Readings from Last 3 Encounters:  05/20/19 230 lb (104.3 kg)  05/07/19 231 lb (104.8 kg)  04/28/19 236 lb (107 kg)     GEN:  Well nourished, well developed in no acute distress HEENT: Normal NECK: No JVD; No carotid bruits LYMPHATICS: No lymphadenopathy CARDIAC: RRR, no murmurs, rubs, gallops RESPIRATORY:  Clear to auscultation without rales, wheezing or rhonchi  ABDOMEN: Soft, non-tender, non-distended MUSCULOSKELETAL:  No edema; No deformity  SKIN: Warm and dry NEUROLOGIC:  Alert and oriented x 3 PSYCHIATRIC:  Normal affect    Signed, Shirlee More, MD  05/20/2019 4:08 PM    [facility]

## 2019-05-20 ENCOUNTER — Encounter: Payer: Self-pay | Admitting: Cardiology

## 2019-05-20 ENCOUNTER — Ambulatory Visit (INDEPENDENT_AMBULATORY_CARE_PROVIDER_SITE_OTHER): Payer: Medicare HMO | Admitting: Cardiology

## 2019-05-20 ENCOUNTER — Other Ambulatory Visit: Payer: Self-pay

## 2019-05-20 VITALS — BP 140/82 | HR 84 | Ht 66.0 in | Wt 230.0 lb

## 2019-05-20 DIAGNOSIS — I5022 Chronic systolic (congestive) heart failure: Secondary | ICD-10-CM | POA: Diagnosis not present

## 2019-05-20 DIAGNOSIS — I493 Ventricular premature depolarization: Secondary | ICD-10-CM | POA: Diagnosis not present

## 2019-05-20 DIAGNOSIS — I420 Dilated cardiomyopathy: Secondary | ICD-10-CM

## 2019-05-20 MED ORDER — METOPROLOL SUCCINATE ER 50 MG PO TB24: 50.0000 mg | ORAL_TABLET | Freq: Every day | ORAL | 1 refills | Status: DC

## 2019-05-20 NOTE — Patient Instructions (Signed)
Medication Instructions:  Your physician has recommended you make the following change in your medication:  START: Metoprolol succinate 50 mg daily   If you need a refill on your cardiac medications before your next appointment, please call your pharmacy.   Lab work: Your physician recommends that you return for lab work today: bmp, pro bnp, magnesium   If you have labs (blood work) drawn today and your tests are completely normal, you will receive your results only by: Marland Kitchen MyChart Message (if you have MyChart) OR . A paper copy in the mail If you have any lab test that is abnormal or we need to change your treatment, we will call you to review the results.  Testing/Procedures: None.   Follow-Up: At [facility], you and your health needs are our priority.  As part of our continuing mission to provide you with exceptional heart care, we have created designated Provider Care Teams.  These Care Teams include your primary Cardiologist (physician) and Advanced Practice Providers (APPs -  Physician Assistants and Nurse Practitioners) who all work together to provide you with the care you need, when you need it. You will need a follow up appointment in 2 weeks.  Please call our office 2 months in advance to schedule this appointment.  You may see No primary care provider on file. or another member of our [employer] Provider Team in [place]: Shirlee More, MD . Jyl Heinz, MD  Any Other Special Instructions Will Be Listed Below (If Applicable).  Metoprolol extended-release tablets What is this medicine? METOPROLOL (me TOE proe lole) is a beta-blocker. Beta-blockers reduce the workload on the heart and help it to beat more regularly. This medicine is used to treat high blood pressure and to prevent chest pain. It is also used to after a heart attack and to prevent an additional heart attack from occurring. This medicine may be used for other purposes; ask your health care provider or  pharmacist if you have questions. COMMON BRAND NAME(S): toprol, Toprol XL What should I tell my health care provider before I take this medicine? They need to know if you have any of these conditions:  diabetes  heart or vessel disease like slow heart rate, worsening heart failure, heart block, sick sinus syndrome or Raynaud's disease  kidney disease  liver disease  lung or breathing disease, like asthma or emphysema  pheochromocytoma  thyroid disease  an unusual or allergic reaction to metoprolol, other beta-blockers, medicines, foods, dyes, or preservatives  pregnant or trying to get pregnant  breast-feeding How should I use this medicine? Take this medicine by mouth with a glass of water. Follow the directions on the prescription label. Do not crush or chew. Take this medicine with or immediately after meals. Take your doses at regular intervals. Do not take more medicine than directed. Do not stop taking this medicine suddenly. This could lead to serious heart-related effects. Talk to your pediatrician regarding the use of this medicine in children. While this drug may be prescribed for children as young as 6 years for selected conditions, precautions do apply. Overdosage: If you think you have taken too much of this medicine contact a poison control center or emergency room at once. NOTE: This medicine is only for you. Do not share this medicine with others. What if I miss a dose? If you miss a dose, take it as soon as you can. If it is almost time for your next dose, take only that dose. Do not  take double or extra doses. What may interact with this medicine? This medicine may interact with the following medications:  certain medicines for blood pressure, heart disease, irregular heart beat  certain medicines for depression, like monoamine oxidase (MAO) inhibitors, fluoxetine, or paroxetine  clonidine  dobutamine  epinephrine  isoproterenol  reserpine This list  may not describe all possible interactions. Give your health care provider a list of all the medicines, herbs, non-prescription drugs, or dietary supplements you use. Also tell them if you smoke, drink alcohol, or use illegal drugs. Some items may interact with your medicine. What should I watch for while using this medicine? Visit your doctor or health care professional for regular check ups. Contact your doctor right away if your symptoms worsen. Check your blood pressure and pulse rate regularly. Ask your health care professional what your blood pressure and pulse rate should be, and when you should contact them. You may get drowsy or dizzy. Do not drive, use machinery, or do anything that needs mental alertness until you know how this medicine affects you. Do not sit or stand up quickly, especially if you are an older patient. This reduces the risk of dizzy or fainting spells. Contact your doctor if these symptoms continue. Alcohol may interfere with the effect of this medicine. Avoid alcoholic drinks. This medicine may increase blood sugar. Ask your healthcare provider if changes in diet or medicines are needed if you have diabetes. What side effects may I notice from receiving this medicine? Side effects that you should report to your doctor or health care professional as soon as possible:  allergic reactions like skin rash, itching or hives  cold or numb hands or feet  depression  difficulty breathing  faint  fever with sore throat  irregular heartbeat, chest pain  rapid weight gain   signs and symptoms of high blood sugar such as being more thirsty or hungry or having to urinate more than normal. You may also feel very tired or have blurry vision.  swollen legs or ankles Side effects that usually do not require medical attention (report to your doctor or health care professional if they continue or are bothersome):  anxiety or nervousness  change in sex drive or  performance  dry skin  headache  nightmares or trouble sleeping  short term memory loss  stomach upset or diarrhea This list may not describe all possible side effects. Call your doctor for medical advice about side effects. You may report side effects to FDA at 1-800-FDA-1088. Where should I keep my medicine? Keep out of the reach of children. Store at room temperature between 15 and 30 degrees C (59 and 86 degrees F). Throw away any unused medicine after the expiration date. NOTE: This sheet is a summary. It may not cover all possible information. If you have questions about this medicine, talk to your doctor, pharmacist, or health care provider.  2020 Elsevier/Gold Standard (2018-07-16 11:09:41)

## 2019-05-21 LAB — BASIC METABOLIC PANEL
BUN/Creatinine Ratio: 14 (ref 9–23)
BUN: 20 mg/dL (ref 6–24)
CO2: 17 mmol/L — ABNORMAL LOW (ref 20–29)
Calcium: 9.7 mg/dL (ref 8.7–10.2)
Chloride: 106 mmol/L (ref 96–106)
Creatinine, Ser: 1.43 mg/dL — ABNORMAL HIGH (ref 0.57–1.00)
GFR calc Af Amer: 50 mL/min/{1.73_m2} — ABNORMAL LOW (ref >59)
GFR calc non Af Amer: 43 mL/min/{1.73_m2} — ABNORMAL LOW (ref >59)
Glucose: 123 mg/dL — ABNORMAL HIGH (ref 65–99)
Potassium: 4 mmol/L (ref 3.5–5.2)
Sodium: 140 mmol/L (ref 134–144)

## 2019-05-21 LAB — PRO B NATRIURETIC PEPTIDE: NT-Pro BNP: 1071 pg/mL — ABNORMAL HIGH (ref 0–249)

## 2019-05-21 LAB — MAGNESIUM: Magnesium: 1.5 mg/dL — ABNORMAL LOW (ref 1.6–2.3)

## 2019-05-22 ENCOUNTER — Ambulatory Visit (HOSPITAL_COMMUNITY): Admission: RE | Admit: 2019-05-22 | Payer: Medicare HMO | Source: Ambulatory Visit

## 2019-05-23 ENCOUNTER — Telehealth: Payer: Self-pay

## 2019-05-23 ENCOUNTER — Other Ambulatory Visit: Payer: Self-pay

## 2019-05-23 ENCOUNTER — Ambulatory Visit (HOSPITAL_BASED_OUTPATIENT_CLINIC_OR_DEPARTMENT_OTHER)
Admission: RE | Admit: 2019-05-23 | Discharge: 2019-05-23 | Disposition: A | Discharge status: Home / Self Care | Payer: Medicare HMO | Source: Ambulatory Visit | Attending: Cardiology | Admitting: Cardiology

## 2019-05-23 DIAGNOSIS — I420 Dilated cardiomyopathy: Secondary | ICD-10-CM | POA: Insufficient documentation

## 2019-05-23 MED ORDER — SLOW-MAG 71.5-119 MG PO TBEC: 2.0000 | DELAYED_RELEASE_TABLET | Freq: Two times a day (BID) | ORAL | 0 refills | Status: DC

## 2019-05-23 MED FILL — SLOW-MAG TABLET: 71.5-119 | 30 days supply | Qty: 120 | Fill #0

## 2019-05-23 NOTE — Telephone Encounter (Signed)
Patient notified of lab results with potassium results being good, but magnesium level is low.  Patient advised to start slo mag take 2 tablets twice daily. Rx sent to pharmacy.  Patient agreed to plan and verbalized understanding. No further questions.

## 2019-05-23 NOTE — Progress Notes (Signed)
  Echocardiogram 2D Echocardiogram has been performed.  Amber Bautista 05/23/2019, 1:58 PM

## 2019-05-29 ENCOUNTER — Encounter: Payer: Self-pay | Admitting: Family Medicine

## 2019-05-29 MED ORDER — NITROGLYCERIN 0.4 % RE OINT: TOPICAL_OINTMENT | RECTAL | 0 refills | Status: DC

## 2019-06-04 ENCOUNTER — Inpatient Hospital Stay: Payer: Medicare HMO

## 2019-06-04 ENCOUNTER — Inpatient Hospital Stay: Payer: Medicare HMO | Admitting: Hematology & Oncology

## 2019-06-05 ENCOUNTER — Encounter: Payer: Self-pay | Admitting: *Deleted

## 2019-06-05 ENCOUNTER — Ambulatory Visit: Payer: Medicare HMO | Admitting: Cardiology

## 2019-06-10 ENCOUNTER — Ambulatory Visit: Payer: Medicare HMO | Admitting: Gastroenterology

## 2019-06-10 ENCOUNTER — Encounter: Payer: Self-pay | Admitting: Gastroenterology

## 2019-06-10 VITALS — BP 134/66 | HR 44 | Temp 98.6°F | Ht 66.0 in | Wt 232.8 lb

## 2019-06-10 DIAGNOSIS — R6889 Other general symptoms and signs: Secondary | ICD-10-CM | POA: Diagnosis not present

## 2019-06-10 DIAGNOSIS — K6289 Other specified diseases of anus and rectum: Secondary | ICD-10-CM

## 2019-06-10 DIAGNOSIS — E340 Carcinoid syndrome: Secondary | ICD-10-CM | POA: Diagnosis not present

## 2019-06-10 MED ORDER — AMBULATORY NON FORMULARY MEDICATION: 0 refills | Status: DC

## 2019-06-10 NOTE — Progress Notes (Signed)
HPI :  48 year old female with a history of metastatic carcinoid syndrome, history of breast cancer, history of CHF, referred by Riki Sheer DO for rectal pain.  She reports having ongoing rectal discomfort for the past 2 months.  She feels a stabbing pain in her rectum with each bowel movement, she also has discomfort with walking.  She states her anal area "feels raw".  She thinks the triggers for her pain is having a bowel movement and by standing or being on her feet for long periods of time.  She averages about 3-5 bowel movements per day at baseline.  She thinks this is due to her underlying carcinoid syndrome.  She denies any blood in her stools for the most part, she denies any abdominal pains that are new to her.  She was given a trial of Anusol and Proctofoam which she did not think helped too much.  She was given an empiric trial of topical nitroglycerin ointment using it twice a day for 2 weeks.  She feels like this helped her rectal pain when she took it but did not resolve it.  She ran out of it a few weeks ago and symptoms have worsened.  Her last colonoscopy was in 2008, no report available, we do see pathology reports for biopsies negative for microscopic colitis.  She states she was unfortunately diagnosed with a carcinoid in 2001.  Over time she developed metastatic disease and is followed by oncology.  She has known lesions in her liver and peritoneum.  There is a pancreatic head lesion which is thought to be the perhaps primary carcinoid.  She had been taking Somatuline for this and Dr. Marin Olp had thought about switching her to [facility].  She has a history of cardiomyopathy from chemotherapy in the past.  Her last ejection fraction was 45 to 50% as noted on echocardiogram 8/14.  PET scan 05/14/19 - metastatic carcinoid, pancreatic head lesion, liver involvement, perotoneal nodal mets  Colonoscopy 02/2007 - microscopic colitis biopsies negative  Echo EF 45-50%  Of note  the patient adamantly declined an internal rectal exam today in the office due to pain and personal history of bad experience with this in a physician's office in the past  Past Medical History:  Diagnosis Date  . Abdominal pain 12/17/2014  . Breast cancer ([place]) 07/12/12   Stage IIB Ductal Carcinoma to  Upper Outer Quadrant: Left Breast: Triple Negative  . Cancer ([place]) 03/03/2015   Overview:  in intestine-2007, in liver-2007, and left breast-2013  . Cancer of upper-outer quadrant of female breast ([place]) 08/01/2012   Image guided biopsy Oct., 2013 -    2 cm by MRI, solitary;     IDC; TNBC; positive LVI;      . Carcinoid tumor of intestine 2001   [place] 2007  . Carcinoma of breast treated with adjuvant chemotherapy ([place])    4 cycles of Carboplatin / Taxol  . Chronic congestive heart failure ([place]) 03/17/2019  . Congestive heart failure (CHF) ([place])   . Diarrhea    Sandostatin  . Frequent PVCs 04/01/2019  . Goals of care, counseling/discussion 03/31/2019  . Hypertension   . Metastatic carcinoid tumor ([place])   . Morbidly obese ([place])   . S/P radiation therapy 02/04/2013-02/21/2013   Left Breast and Axilla / 46.8 Gy in 26 fractions were planned (she only received 18 Gy in 10 fractions)     Past Surgical History:  Procedure Laterality Date  . BREAST SURGERY  08/23/12   Lt  br lumpectomy  . [place] 2007  . COLON SURGERY  2001   Cancer/ Intestinal Resection  . OVARIAN CYST REMOVAL  2001  . PARTIAL MASTECTOMY WITH NEEDLE LOCALIZATION AND AXILLARY SENTINEL LYMPH NODE BX  08/23/2012   Procedure: PARTIAL MASTECTOMY WITH NEEDLE LOCALIZATION AND AXILLARY SENTINEL LYMPH NODE BX;  Surgeon: Adin Hector, MD;  Location: [facility];  Service: General;  Laterality: Left;  blue dye injection   . PORTACATH PLACEMENT  08/23/2012   Procedure: INSERTION PORT-A-CATH;  Surgeon: Adin Hector, MD;  Location: [place];  Service: General;  Laterality: Right;  intraop ultrasound   Family History  Problem  Relation Age of Onset  . Heart attack Father   . Diabetes Father   . Hypertension Mother   . Diabetes Mother   . Glaucoma Mother   . Hypertension Brother   . Asthma Brother   . Cancer Paternal Grandmother    Social History   Tobacco Use  . Smoking status: Never Smoker  . Smokeless tobacco: Never Used  . Tobacco comment: NEVER SMOKED  Substance Use Topics  . Alcohol use: No    Alcohol/week: 0.0 standard drinks  . Drug use: No   Current Outpatient Medications  Medication Sig Dispense Refill  . acetaminophen (TYLENOL) 650 MG CR tablet Take 650 mg by mouth every 8 (eight) hours as needed for pain.    . ferrous sulfate 324 MG TBEC Take 324 mg by mouth daily.    . furosemide (LASIX) 20 MG tablet Take 1 tablet (20 mg total) by mouth daily. 30 tablet 0  . magnesium chloride (SLOW-MAG) 64 MG TBEC SR tablet Take 2 tablets (128 mg total) by mouth 2 (two) times daily. 120 tablet 0  . metoprolol succinate (TOPROL-XL) 50 MG 24 hr tablet Take 1 tablet (50 mg total) by mouth daily. Take with or immediately following a meal. 90 tablet 1  . potassium chloride SA (K-DUR) 20 MEQ tablet Take 1 tablet (20 mEq total) by mouth 2 (two) times daily. 60 tablet 0  . sacubitril-valsartan (ENTRESTO) 49-51 MG Take 1 tablet by mouth 2 (two) times daily. 60 tablet 2  . spironolactone (ALDACTONE) 25 MG tablet Take 1 tablet (25 mg total) by mouth daily. 30 tablet 0   No current facility-administered medications for this visit.    Allergies  Allergen Reactions  . Sulfamethoxazole-Trimethoprim Swelling and Hypertension    Swelling and hypertension after taking, was admitted to hospital  . Adhesive [Tape] Rash and Other (See Comments)    Causes SEVERE RASHES AND BLISTERS     Review of Systems: All systems reviewed and negative except where noted in HPI.    Nm Pet (netspot Ga 68 Dotatate) Skull Base To Mid Thigh  Result Date: 05/14/2019 CLINICAL DATA:  Neuroendocrine tumor of the GI tract. Carcinoid  syndrome. EXAM: NUCLEAR MEDICINE PET SKULL BASE TO THIGH TECHNIQUE: 5.4 mCi Ga 76 DOTATATE was injected intravenously. Full-ring PET imaging was performed from the skull base to thigh after the radiotracer. CT data was obtained and used for attenuation correction and anatomic localization. COMPARISON:  CT 04/21/2019 FINDINGS: NECK Intense radiotracer activity associated with small LEFT supraclavicular node with SUV max equal 28. Incidental CT findings: None CHEST Intense radiotracer activity associated with mildly enlarged mediastinal lymph nodes. For example subcarinal node measuring 1.5 cm with SUV max equal 42.7. Small RIGHT upper paratracheal lymph node measuring 8 mm with SUV max equal 31. Incidental CT finding:No suspicious pulmonary nodules. ABDOMEN/PELVIS Within LEFT  lateral hepatic lobe 9 cm lesion with central calcification has intense radiotracer activity with SUV max equal 66.2. There are multiple additional smaller lesions scattered through the RIGHT hepatic lobe. these represented by approximately 1-2 cm hypodense lesions on CT and intense radiotracer activity (SUV max equal equal 40). Example lesion (image 101 fused data set). Intense radiotracer activity associated with a 3 cm lesion in the neck of the pancreas with SUV max equal 50. There several intensely radiotracer avid lymph nodes in the upper abdomen around the celiac trunk and aortocaval nodal stations. Example aortocaval node measuring 1.2 cm with SUV max equal 50. Scattered within the peritoneal space of the abdomen upper pelvis a number of small ventral nodules with intense radiotracer activity. These nodules measure 5 mm or less but have clear radiotracer activity. Example nodule midline beneath the LEFT rectus muscle with SUV max equal 17.3. Additional example lesion anterior to the RIGHT iliac crest on image 158 with SUV max 24. Larger implant in the RIGHT pelvis measuring 2 cm (image 181/4) with SUV max equal 37.6. Physiologic activity  noted in the liver, spleen, adrenal glands and kidneys. Incidental CT findings:None SKELETON No focal activity to suggest skeletal metastasis. Incidental CT findings:None IMPRESSION: 1. Intense radiotracer activity associated with multi-system metastasis consistent with metastatic well-differentiated neuroendocrine tumor. 2. Primary lesion may well be in the head of the pancreas witha 2 cm lesion. 3. Extensive neuroendocrine tumor hepatic metastasis with large lesion in the LEFT hepatic lobe and multiple smaller lesions the RIGHT hepatic lobe. 4. Nodal metastasis within the mediastinum and periaortic upper abdomen. 5. Peritoneal nodal metastasis with small lesions in the ventral peritoneal space and larger peritoneal implants in the deep pelvis. 6. No evidence skeletal metastasis. Electronically Signed   By: Suzy Bouchard M.D.   On: 05/14/2019 11:27    Physical Exam: BP 134/66 (BP Location: Left Arm)   Pulse (!) 44   Temp 98.6 F (37 C) (Temporal)   Ht 5\' 6"  (1.676 m)   Wt 232 lb 12.8 oz (105.6 kg)   LMP 08/18/2014 (LMP Unknown) Comment: menopausal: chemo induced  SpO2 93%   BMI 37.57 kg/m  Constitutional: Pleasant, female in no acute distress. HEENT: Normocephalic and atraumatic. Conjunctivae are normal. No scleral icterus. Neck supple.  Cardiovascular: Normal rate, regular rhythm.  Pulmonary/chest: Effort normal and breath sounds normal. No wheezing, rales or rhonchi. Abdominal: Soft, protuberant, nontender.  There are no masses palpable.  DRE - no internal exam, patient would not allow due to pain, no obvious external hemorrhoids, ? Fissure- CMA Dixon Boos as standby Extremities: no edema Lymphadenopathy: No cervical adenopathy noted. Neurological: Alert and oriented to person place and time. Skin: Skin is warm and dry. No rashes noted. Psychiatric: Normal mood and affect. Behavior is normal.   ASSESSMENT AND PLAN: 48 y/o female here for new patient assessment the following:   Rectal pain - severe rectal pain for the past 2 months, had some benefit with topical nitroglycerin ointment previously.  History suspicious for an anal fissure however rectal exam is very difficult to obtain, I think she may have a small fissure in the posterior midline anal canal but could not examine adequately without her being sedated.  I am recommending a flex sig with sedation in order to appropriately evaluate this issue, ensure no other rectal ulceration, or mass lesion given her underlying malignancy.  This will be done in the next few days.  She was agreeable to this after discussion of risks and benefits.  In the interim recommend she use 0.125 topical nitroglycerin every 8 hours to empirically treat for anal fissure and spasm.  She agreed with the plan, further recommendations pending results  Metastatic carcinoid syndrome / Chronic diarrhea - unfortunately with progressive metastatic carcinoid syndrome.  She has been managed by oncology, Dr. Marin Olp, recent PET with concerning findings.  Winfield Cellar, MD [facility]  CC: Shelda Pal*

## 2019-06-10 NOTE — Patient Instructions (Signed)
You have been scheduled for a flexible sigmoidoscopy. Please follow the written instructions given to you at your visit today. If you use inhalers (even only as needed), please bring them with you on the day of your procedure. ______________________________________________________ We have sent a prescription for nitroglycerin 0.125% gel to [facility]. You should apply a pea size amount to your rectum three times daily x 6-8 weeks.  [facility]'s information is below: Address: [address], [place], [place] [address]  [phone]  *Please DO NOT go directly from our office to pick up this medication! Give the pharmacy 1 day to process the prescription as this is compounded and takes time to make. _______________________________________________________ If you are age 36 or older, your body mass index should be between 23-30. Your Body mass index is 37.57 kg/m. If this is out of the aforementioned range listed, please consider follow up with your Primary Care Provider.  If you are age 47 or younger, your body mass index should be between 19-25. Your Body mass index is 37.57 kg/m. If this is out of the aformentioned range listed, please consider follow up with your Primary Care Provider.

## 2019-06-11 ENCOUNTER — Encounter: Payer: Self-pay | Admitting: Gastroenterology

## 2019-06-12 ENCOUNTER — Telehealth: Payer: Self-pay

## 2019-06-12 ENCOUNTER — Ambulatory Visit: Payer: Medicare HMO | Admitting: Family

## 2019-06-12 NOTE — Telephone Encounter (Signed)
Covid-19 screening questions   Do you now or have you had a fever in the last 14 days? NO   Do you have any respiratory symptoms of shortness of breath or cough now or in the last 14 days? NO  Do you have any family members or close contacts with diagnosed or suspected Covid-19 in the past 14 days? NO  Have you been tested for Covid-19 and found to be positive? NO        

## 2019-06-13 ENCOUNTER — Ambulatory Visit (AMBULATORY_SURGERY_CENTER): Payer: Medicare HMO | Admitting: Gastroenterology

## 2019-06-13 ENCOUNTER — Other Ambulatory Visit: Payer: Self-pay

## 2019-06-13 ENCOUNTER — Encounter: Payer: Self-pay | Admitting: Gastroenterology

## 2019-06-13 VITALS — BP 131/91 | HR 73 | Temp 98.4°F | Resp 18 | Ht 66.0 in | Wt 232.0 lb

## 2019-06-13 DIAGNOSIS — K600 Acute anal fissure: Secondary | ICD-10-CM

## 2019-06-13 DIAGNOSIS — Z86010 Personal history of colonic polyps: Secondary | ICD-10-CM | POA: Diagnosis not present

## 2019-06-13 DIAGNOSIS — K6289 Other specified diseases of anus and rectum: Secondary | ICD-10-CM

## 2019-06-13 MED ORDER — SODIUM CHLORIDE 0.9 % IV SOLN: 500.0000 mL | Freq: Once | INTRAVENOUS | Status: DC

## 2019-06-13 NOTE — Op Note (Signed)
[facility] Patient Name: Amber Bautista Procedure Date: 06/13/2019 1:32 PM MRN: [id number] Endoscopist: Remo Lipps P. Havery Moros , MD Age: 48 Referring MD:  Date of Birth: [birth year] Gender: Female Account #: [id number] Procedure:                Flexible Sigmoidoscopy Indications:              severe rectal pain, unable to examine in the                            office, had recommended empiric topical                            nitroglycerin, she has not yet used it Medicines:                Monitored Anesthesia Care Procedure:                Pre-Anesthesia Assessment:                           - Prior to the procedure, a History and Physical                            was performed, and patient medications and                            allergies were reviewed. The patient's tolerance of                            previous anesthesia was also reviewed. The risks                            and benefits of the procedure and the sedation                            options and risks were discussed with the patient.                            All questions were answered, and informed consent                            was obtained. Prior Anticoagulants: The patient has                            taken no previous anticoagulant or antiplatelet                            agents. ASA Grade Assessment: III - A patient with                            severe systemic disease. After reviewing the risks                            and benefits, the patient was deemed in  satisfactory condition to undergo the procedure.                           After obtaining informed consent, the scope was                            passed under direct vision. The Colonoscope was                            introduced through the anus and advanced to the the                            sigmoid colon. The flexible sigmoidoscopy was                            accomplished without  difficulty. The patient                            tolerated the procedure well. The quality of the                            bowel preparation was fair. Scope In: 1:36:01 PM Scope Out: 1:39:02 PM Total Procedure Duration: 0 hours 3 minutes 1 second  Findings:                 A deep posterior midline anal fissure was found on                            perianal exam.                           Internal hemorrhoids were found during retroflexion.                           The examined rectum and distal sigmoid colon was                            otherwise without abnormality. Prep was only fair                            but no other pathology noted to cause symptoms. Complications:            No immediate complications. Estimated blood loss:                            None. Estimated Blood Loss:     Estimated blood loss: none. Impression:               -                           - Deep posterior midline anal fissure found on                            perianal exam and is the cause of the patient's  symptoms                           - The rectum and sigmoid colon are normal.                           - Internal hemorrhoids. Recommendation:           - Discharge patient to home.                           - Resume previous diet.                           - Continue present medications                           - Use topical nitroglycerin as previously                            recommended, three times daily, to treat fissure,                            can take up to 8 weeks                           - Daily fiber supplement                           - Topical lidocaine / Recticare PRN Amber Bautista. Glory Graefe, MD 06/13/2019 1:46:36 PM This report has been signed electronically.

## 2019-06-13 NOTE — Patient Instructions (Signed)
Pick up prescription for NTG at [facility]  May use topical Lidocaine or Recticare as needed (over the counter medication)   YOU HAD AN ENDOSCOPIC PROCEDURE TODAY AT [facility]:   Refer to the procedure report that was given to you for any specific questions about what was found during the examination.  If the procedure report does not answer your questions, please call your gastroenterologist to clarify.  If you requested that your care partner not be given the details of your procedure findings, then the procedure report has been included in a sealed envelope for you to review at your convenience later.  YOU SHOULD EXPECT: Some feelings of bloating in the abdomen. Passage of more gas than usual.  Walking can help get rid of the air that was put into your GI tract during the procedure and reduce the bloating. If you had a lower endoscopy (such as a colonoscopy or flexible sigmoidoscopy) you may notice spotting of blood in your stool or on the toilet paper. If you underwent a bowel prep for your procedure, you may not have a normal bowel movement for a few days.  Please Note:  You might notice some irritation and congestion in your nose or some drainage.  This is from the oxygen used during your procedure.  There is no need for concern and it should clear up in a day or so.  SYMPTOMS TO REPORT IMMEDIATELY:   Following lower endoscopy (colonoscopy or flexible sigmoidoscopy):  Excessive amounts of blood in the stool  Significant tenderness or worsening of abdominal pains  Swelling of the abdomen that is new, acute  Fever of 100F or higher   For urgent or emergent issues, a gastroenterologist can be reached at any hour by calling [phone].   DIET:  We do recommend a small meal at first, but then you may proceed to your regular diet.  Drink plenty of fluids but you should avoid alcoholic beverages for 24 hours.  ACTIVITY:  You should plan to take it easy for  the rest of today and you should NOT DRIVE or use heavy machinery until tomorrow (because of the sedation medicines used during the test).    FOLLOW UP: Our staff will call the number listed on your records 48-72 hours following your procedure to check on you and address any questions or concerns that you may have regarding the information given to you following your procedure. If we do not reach you, we will leave a message.  We will attempt to reach you two times.  During this call, we will ask if you have developed any symptoms of COVID 19. If you develop any symptoms (ie: fever, flu-like symptoms, shortness of breath, cough etc.) before then, please call [phone].  If you test positive for Covid 19 in the 2 weeks post procedure, please call and report this information to [place].    If any biopsies were taken you will be contacted by phone or by letter within the next 1-3 weeks.  Please call us at [phone] if you have not heard about the biopsies in 3 weeks.    SIGNATURES/CONFIDENTIALITY: You and/or your care partner have signed paperwork which will be entered into your electronic medical record.  These signatures attest to the fact that that the information above on your After Visit Summary has been reviewed and is understood.  Full responsibility of the confidentiality of this discharge information lies with you and/or your care-partner.

## 2019-06-13 NOTE — Progress Notes (Signed)
A/ox3, pleased with MAC, report to RN 

## 2019-06-13 NOTE — Progress Notes (Signed)
Patient alternates between bigeminal pvc to nsr. Osvaldo Angst CRNA in to see the patient.Valsalver maneuver, cleared to nsr then back to frequent pvc.  bp stable, denies sob or chest pain  Called [facility] to check if ntg rx was ready. Pharmacist stating patient had said she did not want it. Price 39.99 Asked the patient, patient stating she wants to pick up. Pharmacy will have rx ready in 30 mins

## 2019-06-13 NOTE — Progress Notes (Signed)
June temp Courtney vitals

## 2019-06-15 NOTE — Progress Notes (Signed)
Office Visit    Patient Name: Amber Bautista Date of Encounter: 06/19/2019  Primary Care Provider:  Shelda Pal, DO Primary Cardiologist:  Amber More, MD Electrophysiologist:  None   Chief Complaint    Amber Bautista is a 48 y.o. female with a hx of systolic heart failure, nonischemic cardiomyopathy, obesity, HTN, LBBB, metastatic carcinoid with liver metastases, stage IIb ductal carcinoma of left breast (s/p adjuvant chemotherapy with 4 cycles of carboplatinum/Taxotere) presents today for follow-up after recent echocardiogram.  Past Medical History    Past Medical History:  Diagnosis Date  . Abdominal pain 12/17/2014  . Breast cancer ([place]) 07/12/12   Stage IIB Ductal Carcinoma to  Upper Outer Quadrant: Left Breast: Triple Negative  . Cancer ([place]) 03/03/2015   Overview:  in intestine-2007, in liver-2007, and left breast-2013  . Cancer of upper-outer quadrant of female breast ([place]) 08/01/2012   Image guided biopsy Oct., 2013 -    2 cm by MRI, solitary;     IDC; TNBC; positive LVI;      . Carcinoid tumor of intestine 2001   [place] 2007  . Carcinoma of breast treated with adjuvant chemotherapy ([place])    4 cycles of Carboplatin / Taxol  . Chronic congestive heart failure ([place]) 03/17/2019  . Congestive heart failure (CHF) ([place])   . Diarrhea    Sandostatin  . Frequent PVCs 04/01/2019  . Goals of care, counseling/discussion 03/31/2019  . Hypertension   . Metastatic carcinoid tumor ([place])   . Morbidly obese ([place])   . S/P radiation therapy 02/04/2013-02/21/2013   Left Breast and Axilla / 46.8 Gy in 26 fractions were planned (she only received 18 Gy in 10 fractions)   Past Surgical History:  Procedure Laterality Date  . BREAST SURGERY  08/23/12   Lt br lumpectomy  . [place] 2007  . COLON SURGERY  2001   Cancer/ Intestinal Resection  . OVARIAN CYST REMOVAL  2001  . PARTIAL MASTECTOMY WITH NEEDLE LOCALIZATION AND AXILLARY SENTINEL LYMPH NODE BX  08/23/2012   Procedure: PARTIAL MASTECTOMY WITH NEEDLE LOCALIZATION AND AXILLARY SENTINEL LYMPH NODE BX;  Surgeon: Adin Hector, MD;  Location: [facility];  Service: General;  Laterality: Left;  blue dye injection   . PORTACATH PLACEMENT  08/23/2012   Procedure: INSERTION PORT-A-CATH;  Surgeon: Adin Hector, MD;  Location: [facility];  Service: General;  Laterality: Right;  intraop ultrasound    Allergies  Allergies  Allergen Reactions  . Sulfamethoxazole-Trimethoprim Swelling and Hypertension    Swelling and hypertension after taking, was admitted to hospital  . Adhesive [Tape] Rash and Other (See Comments)    Causes SEVERE RASHES AND BLISTERS    History of Present Illness    Amber Bautista is a 48 y.o. female with a hx of hx of systolic heart failure, nonischemic cardiomyopathy, obesity, HTN, LBBB, metastatic carcinoid with liver metastases, stage IIb ductal carcinoma of left breast (s/p adjuvant chemotherapy with 4 cycles of carboplatinum/Taxotere) last seen by Amber Bautista 05/20/2019.  Echo 08/2012 LVEF 25 to 30%, mild LA enlargement, mild MR, severe LV dilation.  Seen by cardiology at [facility] underwent heart cath showing "very weak heart muscle and normal coronary arteries ".  7-day ZIO 05/02/2019 showed sinus rhythm with frequent (7.4%) ventricular ectopy.  Echo 05/23/2019 with EF 45 to 50%, moderate LV thickness, diastolic parameters consistent with impaired relaxation, mild hypokinesis of the LV entire septal wall, RV normal size and function, moderate to severe  TR. Suggested consider MRI for TDS and eval of RV/TR.   Reviewed echocardiogram images with Amber Bautista. EF by visual estimate 40-45% with normal RV. She has a wall motion pattern consistent with her known LBBB. Consistent findings with NICM and no need for repeat echo nor MRI at this time.   Tells me her DOE has improved significantly. She has been increasing her activity level. Her primary complaint is that she has been  diagnosed with anal fissure and prescribed an ointment by her GI - tells me it is improving some.   She does not check her blood pressure at home. Denies dizziness, lightheadedness, pre-syncope. Her palpitations have improved and she tells me she "hardly notices them". She denies SOB, DOE, LE edema.   We discussed her echocardiogram results in depth. She is relieved her pumping function has improved and to know that her symptoms have a cause (moderate to severe TR).   She tells me she has not been taking SlowMag.   EKGs/Labs/Other Studies Reviewed:   The following studies were reviewed today: Echo 05/23/19 1. Mild hypokinesis of the left ventricular, entire septal wall.  2. The left ventricle has mildly reduced systolic function, with an ejection fraction of 45-50%. The cavity size was normal. There is moderately increased left ventricular wall thickness. Left ventricular diastolic Doppler parameters are consistent with  impaired relaxation.  3. The right ventricle has normal systolic function. The cavity was normal. There is no increase in right ventricular wall thickness.  4. Right atrial size was mildly dilated.  5. Tricuspid valve regurgitation is moderate-severe.  6. Mild pulmonic stenosis.  7. The aorta is normal unless otherwise noted.  8. The inferior vena cava was normal in size with <50% respiratory variability.  9. TDS, cinsider MRI for better assesment of RV and TR  EKG:  EKG is ordered today.  The ekg ordered today demonstrates SR rate 83 bpm with frequent PVC and bigeminy. LVH with QRS widening; poor R wave progression.   Recent Labs: 05/07/2019: ALT 25; Hemoglobin 14.5; Platelet Count 439 05/20/2019: BUN 20; Creatinine, Ser 1.43; Magnesium 1.5; NT-Pro BNP 1,071; Potassium 4.0; Sodium 140  Recent Lipid Panel No results found for: CHOL, TRIG, HDL, CHOLHDL, VLDL, LDLCALC, LDLDIRECT  Home Medications   Current Meds  Medication Sig  . AMBULATORY NON FORMULARY MEDICATION  Nitroglycerin 0.125% gel. Apply a pea size amount to the rectum every 8 hours  . furosemide (LASIX) 20 MG tablet Take 1 tablet (20 mg total) by mouth daily.  . magnesium chloride (SLOW-MAG) 64 MG TBEC SR tablet Take 2 tablets (128 mg total) by mouth 2 (two) times daily.  . metoprolol succinate (TOPROL-XL) 50 MG 24 hr tablet Take 1 tablet (50 mg total) by mouth daily. Take with or immediately following a meal.  . potassium chloride SA (K-DUR) 20 MEQ tablet Take 1 tablet (20 mEq total) by mouth 2 (two) times daily.  . sacubitril-valsartan (ENTRESTO) 49-51 MG Take 1 tablet by mouth 2 (two) times daily.  Marland Kitchen spironolactone (ALDACTONE) 25 MG tablet Take 1 tablet (25 mg total) by mouth daily.      Review of Systems      Review of Systems  Constitution: Negative for chills, fever and malaise/fatigue.  Cardiovascular: Positive for palpitations. Negative for chest pain, dyspnea on exertion, irregular heartbeat, leg swelling and near-syncope.  Respiratory: Negative for cough, shortness of breath and wheezing.   Gastrointestinal: Negative for nausea and vomiting.  Neurological: Negative for dizziness, light-headedness and weakness.  All other systems reviewed and are otherwise negative except as noted above.  Physical Exam    VS:  Pulse (!) 44   Ht 5\' 6"  (1.676 m)   Wt 231 lb 1.9 oz (104.8 kg)   LMP 08/18/2014 (LMP Unknown) Comment: menopausal: chemo induced  SpO2 98%   BMI 37.30 kg/m  , BMI Body mass index is 37.3 kg/m. GEN: Well nourished, overweigh, well developed, in no acute distress. HEENT: normal. Neck: Supple, no JVD, carotid bruits, or masses. Cardiac: irregular, no murmurs, rubs, or gallops. No clubbing, cyanosis, edema.  Radials/DP/PT 2+ and equal bilaterally.  Respiratory:  Respirations regular and unlabored, clear to auscultation bilaterally. GI: Soft, nontender, nondistended, BS + x 4. MS: No deformity or atrophy. Skin: Warm and dry, no rash. Neuro:  Strength and sensation  are intact. Psych: Normal affect.  Assessment & Plan    1. Dilated non-ischemic cardiomyopathy - Echo 05/23/19 with EF 45-50% (improved from 25-30% in 2013), impaired LV relaxation, RV normal function/size, mod-severe TR. Euvolemic on exam today. NYHA I with no DOE, SOB, edema. GDMT loop diuretic, MRA, entresto, beta blocker.   BP is low normal today with SBP 116 and she does not check at home hesitant to increase Entresto dose. She agrees to purchase arm cuff and we will consider up titration at her next visit.   2. Hypertensive heart disease with heart failure - BP well controlled today. Does not check at home - agrees to purchase arm cuff. No dizziness, lightheadedness. Continue present GDMT, as above.   3. Moderate-severe tricuspid regurgitation - Suspect this was the cause of her severe DOE which has now resolved.  Continue loop diuretic and MRA.   4. PVCs/Bigeminy - Zio monitor 05/02/19 with 7.4% incidence of isolated PVCs, longest episode bigeminy 23 minutes 28 seconds, trigeminy 1 minute 26 seconds. EKG today with SR frequent PVC and bigeminy rate of 83 bpm. She reports only intermittent palpitations. We dicussed that these PVCs will likely cause errors in automatic readings of pulse on pulse oximetry as it did today. Presently managed on beta blocker.   Referral to EP for further evaluation of frequent ventricular ectopy.   5. Hypomagnesia - Asked to initiate SlowMag after previous office visit with magnesium 1.5. She has not been taking - will recheck today.   Disposition: Follow up in 3 month(s) with Amber Bautista.    Loel Dubonnet, NP 06/19/2019, 4:54 PM

## 2019-06-18 ENCOUNTER — Other Ambulatory Visit: Payer: Medicare HMO

## 2019-06-18 ENCOUNTER — Telehealth: Payer: Self-pay

## 2019-06-18 ENCOUNTER — Ambulatory Visit: Payer: Medicare HMO

## 2019-06-18 ENCOUNTER — Ambulatory Visit: Payer: Medicare HMO | Admitting: Hematology & Oncology

## 2019-06-18 ENCOUNTER — Telehealth: Payer: Self-pay | Admitting: *Deleted

## 2019-06-18 NOTE — Telephone Encounter (Signed)
No answer, left message to call back later today, B.Danee Soller RN. 

## 2019-06-18 NOTE — Telephone Encounter (Signed)
  Follow up Call-  Call back number 06/13/2019  Post procedure Call Back phone  # [phone]  Permission to leave phone message Yes  Some recent data might be hidden     Patient questions:  Do you have a fever, pain , or abdominal swelling? No. Pain Score  0 *  Have you tolerated food without any problems? Yes.    Have you been able to return to your normal activities? Yes.    Do you have any questions about your discharge instructions: Diet   No. Medications  No. Follow up visit  No.  Do you have questions or concerns about your Care? No.  Actions: * If pain score is 4 or above:   1. Have you developed a fever since your procedure? No  2.   Have you had an respiratory symptoms (SOB or cough) since your procedure? no  3.   Have you tested positive for COVID 19 since your procedure no  4.   Have you had any family members/close contacts diagnosed with the COVID 19 since your procedure?  no   If yes to any of these questions please route to Joylene John, RN and Alphonsa Gin, Therapist, sports.

## 2019-06-19 ENCOUNTER — Encounter: Payer: Self-pay | Admitting: Family

## 2019-06-19 ENCOUNTER — Other Ambulatory Visit: Payer: Self-pay

## 2019-06-19 ENCOUNTER — Ambulatory Visit (INDEPENDENT_AMBULATORY_CARE_PROVIDER_SITE_OTHER): Payer: Medicare HMO | Admitting: Family

## 2019-06-19 VITALS — BP 116/80 | HR 83 | Temp 98.0°F | Ht 66.0 in | Wt 231.1 lb

## 2019-06-19 DIAGNOSIS — I110 Hypertensive heart disease with heart failure: Secondary | ICD-10-CM

## 2019-06-19 DIAGNOSIS — I420 Dilated cardiomyopathy: Secondary | ICD-10-CM

## 2019-06-19 DIAGNOSIS — I498 Other specified cardiac arrhythmias: Secondary | ICD-10-CM

## 2019-06-19 DIAGNOSIS — I493 Ventricular premature depolarization: Secondary | ICD-10-CM

## 2019-06-19 DIAGNOSIS — I361 Nonrheumatic tricuspid (valve) insufficiency: Secondary | ICD-10-CM

## 2019-06-19 DIAGNOSIS — I499 Cardiac arrhythmia, unspecified: Secondary | ICD-10-CM | POA: Diagnosis not present

## 2019-06-19 MED FILL — RECTIV 0.4% OINTMENT: 0.4 | 30 days supply | Qty: 30 | Fill #0

## 2019-06-19 NOTE — Patient Instructions (Signed)
Medication Instructions:  No medication changes today.   If you need a refill on your cardiac medications before your next appointment, please call your pharmacy.   Lab work: Your physician recommends that you return for lab work today: BMP, Magnesium  If you have labs (blood work) drawn today and your tests are completely normal, you will receive your results only by: Marland Kitchen MyChart Message (if you have MyChart) OR . A paper copy in the mail If you have any lab test that is abnormal or we need to change your treatment, we will call you to review the results.  Testing/Procedures: You had an EKG today.   Follow-Up: At [facility], you and your health needs are our priority.  As part of our continuing mission to provide you with exceptional heart care, we have created designated Provider Care Teams.  These Care Teams include your primary Cardiologist (physician) and Advanced Practice Providers (APPs -  Physician Assistants and Nurse Practitioners) who all work together to provide you with the care you need, when you need it. You will need a follow up appointment in 3 months. You may see Shirlee More, MD or another member of our [facility] Provider Team in [place]: Jenne Campus, MD . Jyl Heinz, MD . Berniece Salines, MD . Laurann Montana, NP  Any Other Special Instructions Will Be Listed Below (If Applicable).  Medications and Palpitations 1. Avoid all over-the-counter antihistamines except Claritin/Loratadine and Zyrtec/Cetrizine. 2. Avoid all combination including cold sinus allergies flu decongestant and sleep medications 3. You can use Robitussin DM Mucinex and Mucinex DM for cough. 4. can use Tylenol aspirin ibuprofen and naproxen but no combinations such as sleep or sinus.

## 2019-06-20 ENCOUNTER — Ambulatory Visit: Payer: Medicare HMO

## 2019-06-20 ENCOUNTER — Ambulatory Visit: Payer: Medicare HMO | Admitting: Hematology & Oncology

## 2019-06-20 ENCOUNTER — Other Ambulatory Visit: Payer: Medicare HMO

## 2019-06-20 ENCOUNTER — Encounter: Payer: Self-pay | Admitting: Family

## 2019-06-20 ENCOUNTER — Other Ambulatory Visit: Payer: Self-pay | Admitting: Family

## 2019-06-20 DIAGNOSIS — I498 Other specified cardiac arrhythmias: Secondary | ICD-10-CM | POA: Insufficient documentation

## 2019-06-20 LAB — BASIC METABOLIC PANEL
BUN/Creatinine Ratio: 14 (ref 9–23)
BUN: 19 mg/dL (ref 6–24)
CO2: 21 mmol/L (ref 20–29)
Calcium: 9.7 mg/dL (ref 8.7–10.2)
Chloride: 105 mmol/L (ref 96–106)
Creatinine, Ser: 1.37 mg/dL — ABNORMAL HIGH (ref 0.57–1.00)
GFR calc Af Amer: 52 mL/min/{1.73_m2} — ABNORMAL LOW (ref >59)
GFR calc non Af Amer: 45 mL/min/{1.73_m2} — ABNORMAL LOW (ref >59)
Glucose: 111 mg/dL — ABNORMAL HIGH (ref 65–99)
Potassium: 4.2 mmol/L (ref 3.5–5.2)
Sodium: 140 mmol/L (ref 134–144)

## 2019-06-20 LAB — MAGNESIUM: Magnesium: 1.6 mg/dL (ref 1.6–2.3)

## 2019-06-20 MED ORDER — SLOW-MAG 71.5-119 MG PO TBEC: 1.0000 | DELAYED_RELEASE_TABLET | Freq: Two times a day (BID) | ORAL | 0 refills | Status: AC

## 2019-06-24 ENCOUNTER — Encounter: Payer: Self-pay | Admitting: Hematology & Oncology

## 2019-06-25 ENCOUNTER — Encounter: Payer: Self-pay | Admitting: Family Medicine

## 2019-06-25 MED ORDER — METOPROLOL SUCCINATE ER 50 MG PO TB24: 50.0000 mg | ORAL_TABLET | Freq: Every day | ORAL | 1 refills | Status: DC

## 2019-06-25 MED FILL — METOPROLOL SUCCINATE ER 50: 50 | 90 days supply | Qty: 90 | Fill #0

## 2019-06-27 DIAGNOSIS — R6889 Other general symptoms and signs: Secondary | ICD-10-CM | POA: Diagnosis not present

## 2019-07-10 DIAGNOSIS — R55 Syncope and collapse: Secondary | ICD-10-CM

## 2019-07-10 HISTORY — DX: Syncope and collapse: R55

## 2019-07-16 ENCOUNTER — Encounter: Payer: Self-pay | Admitting: *Deleted

## 2019-07-16 ENCOUNTER — Other Ambulatory Visit: Payer: Self-pay

## 2019-07-16 ENCOUNTER — Inpatient Hospital Stay: Payer: Medicare HMO | Admitting: Hematology & Oncology

## 2019-07-16 ENCOUNTER — Inpatient Hospital Stay: Payer: Medicare HMO | Attending: Hematology & Oncology

## 2019-07-16 MED ORDER — AMBULATORY NON FORMULARY MEDICATION: 0 refills | Status: DC

## 2019-07-16 NOTE — Patient Instructions (Signed)
Patient called because she missed her appointment today for lab and Dr Marin Olp.  Patient stated she thought she had cancelled it.  She has been having a lot of problems with anal fissures and hemorrhoids and needs to get these fixed before she can come in.  She is having a hard time even sitting down.  Patient states she will call us when she is feeling better.

## 2019-07-17 ENCOUNTER — Telehealth: Payer: Self-pay | Admitting: Gastroenterology

## 2019-07-17 NOTE — Telephone Encounter (Signed)
Attempted calling patient back and got voice mail. Left message to please call back

## 2019-07-17 NOTE — Telephone Encounter (Signed)
Called the patient - she has pain and had some puss / blood discharge from it and feels better but not resolved. Concerned she could have had an abscess that drained vs. Thrombosed hemorrhoid and needs an office exam to clarify what's going on. Can you please help book with one of the APPs or myself tomorrow where open? Thanks. If any fevers overnight she can contact us or go to the ED. Overall feels better today

## 2019-07-17 NOTE — Telephone Encounter (Signed)
Patient called back and I got more information. Says the hemorrhoid burst while she was taking a warm shower and so the pain isn't as horrible but it still stings and is purple. Please advise

## 2019-07-17 NOTE — Telephone Encounter (Signed)
Below is the message the patient sent: "I need an emergency visit. The anal fissure was healing fine but I got an external hemorrhoid thats not healing with otc ointments. It got very big and looks purplish, thrombosed? Maybe? But i am in Excruciating pain. If u can see you ir an associate. Please call me. [phone]."

## 2019-07-18 ENCOUNTER — Emergency Department (HOSPITAL_BASED_OUTPATIENT_CLINIC_OR_DEPARTMENT_OTHER): Payer: Medicare HMO

## 2019-07-18 ENCOUNTER — Encounter (HOSPITAL_BASED_OUTPATIENT_CLINIC_OR_DEPARTMENT_OTHER): Payer: Self-pay | Admitting: *Deleted

## 2019-07-18 ENCOUNTER — Ambulatory Visit: Payer: Medicare HMO | Admitting: Nurse Practitioner

## 2019-07-18 ENCOUNTER — Other Ambulatory Visit: Payer: Self-pay

## 2019-07-18 ENCOUNTER — Emergency Department (HOSPITAL_BASED_OUTPATIENT_CLINIC_OR_DEPARTMENT_OTHER)
Admission: EM | Admit: 2019-07-18 | Discharge: 2019-07-18 | Disposition: A | Discharge status: Home / Self Care | Payer: Medicare HMO | Attending: Emergency Medicine | Admitting: Emergency Medicine

## 2019-07-18 DIAGNOSIS — Z853 Personal history of malignant neoplasm of breast: Secondary | ICD-10-CM | POA: Diagnosis not present

## 2019-07-18 DIAGNOSIS — Z79899 Other long term (current) drug therapy: Secondary | ICD-10-CM | POA: Insufficient documentation

## 2019-07-18 DIAGNOSIS — K649 Unspecified hemorrhoids: Secondary | ICD-10-CM | POA: Diagnosis not present

## 2019-07-18 DIAGNOSIS — I110 Hypertensive heart disease with heart failure: Secondary | ICD-10-CM | POA: Insufficient documentation

## 2019-07-18 DIAGNOSIS — K6289 Other specified diseases of anus and rectum: Secondary | ICD-10-CM | POA: Diagnosis not present

## 2019-07-18 DIAGNOSIS — I5022 Chronic systolic (congestive) heart failure: Secondary | ICD-10-CM | POA: Insufficient documentation

## 2019-07-18 DIAGNOSIS — E876 Hypokalemia: Secondary | ICD-10-CM | POA: Insufficient documentation

## 2019-07-18 LAB — BASIC METABOLIC PANEL
Anion gap: 12 (ref 5–15)
BUN: 16 mg/dL (ref 6–20)
CO2: 21 mmol/L — ABNORMAL LOW (ref 22–32)
Calcium: 9 mg/dL (ref 8.9–10.3)
Chloride: 105 mmol/L (ref 98–111)
Creatinine, Ser: 1.5 mg/dL — ABNORMAL HIGH (ref 0.44–1.00)
GFR calc Af Amer: 47 mL/min — ABNORMAL LOW (ref >60)
GFR calc non Af Amer: 40 mL/min — ABNORMAL LOW (ref >60)
Glucose, Bld: 128 mg/dL — ABNORMAL HIGH (ref 70–99)
Potassium: 2.9 mmol/L — ABNORMAL LOW (ref 3.5–5.1)
Sodium: 138 mmol/L (ref 135–145)

## 2019-07-18 LAB — CBC WITH DIFFERENTIAL/PLATELET
Abs Immature Granulocytes: 0.03 10*3/uL (ref 0.00–0.07)
Basophils Absolute: 0.1 10*3/uL (ref 0.0–0.1)
Basophils Relative: 1 %
Eosinophils Absolute: 0 10*3/uL (ref 0.0–0.5)
Eosinophils Relative: 0 %
HCT: 47.9 % — ABNORMAL HIGH (ref 36.0–46.0)
Hemoglobin: 14.6 g/dL (ref 12.0–15.0)
Immature Granulocytes: 0 %
Lymphocytes Relative: 15 %
Lymphs Abs: 1.5 10*3/uL (ref 0.7–4.0)
MCH: 28.2 pg (ref 26.0–34.0)
MCHC: 30.5 g/dL (ref 30.0–36.0)
MCV: 92.6 fL (ref 80.0–100.0)
Monocytes Absolute: 0.5 10*3/uL (ref 0.1–1.0)
Monocytes Relative: 5 %
Neutro Abs: 8.3 10*3/uL — ABNORMAL HIGH (ref 1.7–7.7)
Neutrophils Relative %: 79 %
Platelets: 311 10*3/uL (ref 150–400)
RBC: 5.17 MIL/uL — ABNORMAL HIGH (ref 3.87–5.11)
RDW: 17.6 % — ABNORMAL HIGH (ref 11.5–15.5)
WBC: 10.4 10*3/uL (ref 4.0–10.5)
nRBC: 0 % (ref 0.0–0.2)

## 2019-07-18 MED ORDER — AMOXICILLIN-POT CLAVULANATE 875-125 MG PO TABS: 1.0000 | ORAL_TABLET | Freq: Two times a day (BID) | ORAL | 0 refills | Status: DC

## 2019-07-18 MED ORDER — POTASSIUM CHLORIDE CRYS ER 20 MEQ PO TBCR
40.0000 meq | EXTENDED_RELEASE_TABLET | Freq: Once | ORAL | Status: AC
  Administered 2019-07-18: 22:00:00 40 meq via ORAL
  Filled 2019-07-18: qty 2

## 2019-07-18 MED ORDER — LIDOCAINE HCL URETHRAL/MUCOSAL 2 % EX GEL: CUTANEOUS | 0 refills | Status: DC

## 2019-07-18 MED ORDER — FENTANYL CITRATE (PF) 100 MCG/2ML IJ SOLN
50.0000 ug | Freq: Once | INTRAMUSCULAR | Status: AC
  Administered 2019-07-18: 20:00:00 50 ug via INTRAVENOUS
  Filled 2019-07-18: qty 2

## 2019-07-18 MED ORDER — POTASSIUM CHLORIDE CRYS ER 20 MEQ PO TBCR: 20.0000 meq | EXTENDED_RELEASE_TABLET | Freq: Every day | ORAL | 0 refills | Status: DC

## 2019-07-18 MED ORDER — IOHEXOL 300 MG/ML  SOLN
75.0000 mL | Freq: Once | INTRAMUSCULAR | Status: AC | PRN
  Administered 2019-07-18: 21:00:00 75 mL via INTRAVENOUS

## 2019-07-18 NOTE — ED Provider Notes (Signed)
[facility] EMERGENCY DEPARTMENT Provider Note   CSN: [id number] Arrival date & time: 07/18/19  1752     History   Chief Complaint Chief Complaint  Patient presents with  . Rectal Pain    HPI Amber Bautista is a 48 y.o. female with a history of hypertension, morbid obesity, breast/ intestinal carcinoid tumor, & dilated cardiomyopathy who presents to the ED with complaints of rectal pain over the past 2-3 days. Patient states she has had issues w/ hemorrhoids over the past couple of months, also had an anal fissure found on a flexible sigmoidoscopy, has been receiving treatment for these conditions with improvement.  She states a few days ago she started to have increased rectal pain and feeling of swelling, she suspected this was a hemorrhoid, she started using Preparation H without much relief.  Symptoms worsen.  She states the area became fairly swollen and firm and then ruptured leaking some blood and yellow pus.  She feels the area is becoming swollen again after leakage.  She states the area is very painful currently severe, worse with bowel movement or with position changes, no alleviating factors.  Denies fever, chills, nausea, vomiting, or abdominal pain.    HPI  Past Medical History:  Diagnosis Date  . Abdominal pain 12/17/2014  . Breast cancer ([place]) 07/12/12   Stage IIB Ductal Carcinoma to  Upper Outer Quadrant: Left Breast: Triple Negative  . Cancer ([place]) 03/03/2015   Overview:  in intestine-2007, in liver-2007, and left breast-2013  . Cancer of upper-outer quadrant of female breast ([place]) 08/01/2012   Image guided biopsy Oct., 2013 -    2 cm by MRI, solitary;     IDC; TNBC; positive LVI;      . Carcinoid tumor of intestine 2001   [place] 2007  . Carcinoma of breast treated with adjuvant chemotherapy ([place])    4 cycles of Carboplatin / Taxol  . Chronic congestive heart failure ([place]) 03/17/2019  . Congestive heart failure (CHF) ([place])   . Diarrhea    Sandostatin  .  Frequent PVCs 04/01/2019  . Goals of care, counseling/discussion 03/31/2019  . Hypertension   . Metastatic carcinoid tumor ([place])   . Morbidly obese ([place])   . S/P radiation therapy 02/04/2013-02/21/2013   Left Breast and Axilla / 46.8 Gy in 26 fractions were planned (she only received 18 Gy in 10 fractions)    Patient Active Problem List   Diagnosis Date Noted  . Bigeminy 06/20/2019  . Dilated cardiomyopathy ([place]) 04/02/2019  . Hypokalemia 04/02/2019  . Hypertensive heart disease with heart failure ([place]) 04/02/2019  . Frequent PVCs 04/01/2019  . Goals of care, counseling/discussion 03/31/2019  . Chronic systolic heart failure ([place]) 03/17/2019  . Cancer ([place]) 03/03/2015  . Abdominal pain 12/17/2014  . Breast cancer ([place])   . Cancer of upper-outer quadrant of female breast ([place]) 08/01/2012  . Carcinoid tumor of intestine 03/18/2012    Past Surgical History:  Procedure Laterality Date  . BREAST SURGERY  08/23/12   Lt br lumpectomy  . [place] 2007  . COLON SURGERY  2001   Cancer/ Intestinal Resection  . OVARIAN CYST REMOVAL  2001  . PARTIAL MASTECTOMY WITH NEEDLE LOCALIZATION AND AXILLARY SENTINEL LYMPH NODE BX  08/23/2012   Procedure: PARTIAL MASTECTOMY WITH NEEDLE LOCALIZATION AND AXILLARY SENTINEL LYMPH NODE BX;  Surgeon: Adin Hector, MD;  Location: [facility];  Service: General;  Laterality: Left;  blue dye injection   . PORTACATH PLACEMENT  08/23/2012   Procedure: INSERTION PORT-A-CATH;  Surgeon: Adin Hector, MD;  Location: [facility];  Service: General;  Laterality: Right;  intraop ultrasound     OB History   No obstetric history on file.      Home Medications    Prior to Admission medications   Medication Sig Start Date End Date Taking? Authorizing Provider  AMBULATORY NON FORMULARY MEDICATION Nitroglycerin 0.125% gel. Apply a pea size amount to the rectum every 8 hours 07/16/19   Armbruster, Carlota Raspberry, MD  furosemide (LASIX) 20 MG tablet Take 1 tablet (20  mg total) by mouth daily. 04/02/19 07/01/19  Richardo Priest, MD  magnesium chloride (SLOW-MAG) 64 MG TBEC SR tablet Take 1 tablet (64 mg total) by mouth 2 (two) times daily. 06/20/19   Loel Dubonnet, NP  metoprolol succinate (TOPROL-XL) 50 MG 24 hr tablet Take 1 tablet (50 mg total) by mouth daily. Take with or immediately following a meal. 06/25/19 09/23/19  Wendling, Crosby Oyster, DO  potassium chloride SA (K-DUR) 20 MEQ tablet Take 1 tablet (20 mEq total) by mouth 2 (two) times daily. 04/02/19   Richardo Priest, MD  sacubitril-valsartan (ENTRESTO) 49-51 MG Take 1 tablet by mouth 2 (two) times daily. 04/02/19   Richardo Priest, MD  spironolactone (ALDACTONE) 25 MG tablet Take 1 tablet (25 mg total) by mouth daily. 04/02/19 07/01/19  Richardo Priest, MD    Family History Family History  Problem Relation Age of Onset  . Heart attack Father   . Diabetes Father   . Hypertension Mother   . Diabetes Mother   . Glaucoma Mother   . Hypertension Brother   . Asthma Brother   . Cancer Paternal Grandmother   . Colon cancer Neg Hx   . Esophageal cancer Neg Hx   . Rectal cancer Neg Hx   . Stomach cancer Neg Hx     Social History Social History   Tobacco Use  . Smoking status: Never Smoker  . Smokeless tobacco: Never Used  . Tobacco comment: NEVER SMOKED  Substance Use Topics  . Alcohol use: No    Alcohol/week: 0.0 standard drinks  . Drug use: No     Allergies   Sulfamethoxazole-trimethoprim and Adhesive [tape]   Review of Systems Review of Systems  Constitutional: Negative for chills and fever.  Respiratory: Negative for shortness of breath.   Cardiovascular: Negative for chest pain.  Gastrointestinal: Positive for rectal pain. Negative for abdominal pain, constipation, nausea and vomiting.  Genitourinary: Negative for dysuria, vaginal bleeding and vaginal discharge.  All other systems reviewed and are negative.    Physical Exam Updated Vital Signs BP 138/90 (BP Location:  Right Arm)   Pulse 88   Temp 98 F (36.7 C) (Oral)   Resp 20   Ht 5\' 6"  (1.676 m)   Wt 102.5 kg   LMP 08/18/2014 (LMP Unknown) Comment: menopausal: chemo induced  SpO2 99%   BMI 36.48 kg/m   Physical Exam Vitals signs and nursing note reviewed. Exam conducted with a chaperone present.  Constitutional:      General: She is not in acute distress.    Appearance: She is well-developed. She is not toxic-appearing.  HENT:     Head: Normocephalic and atraumatic.  Eyes:     General:        Right eye: No discharge.        Left eye: No discharge.     Conjunctiva/sclera: Conjunctivae normal.  Neck:  Musculoskeletal: Neck supple.  Cardiovascular:     Rate and Rhythm: Normal rate and regular rhythm.  Pulmonary:     Effort: Pulmonary effort is normal. No respiratory distress.     Breath sounds: Normal breath sounds. No wheezing, rhonchi or rales.  Abdominal:     General: There is no distension.     Palpations: Abdomen is soft.     Tenderness: There is no abdominal tenderness.  Genitourinary:      Comments: Exam somewhat limited secondary to patient pain.  Discussed DRE patient would very much like to avoid this if at all possible due to trauma as a child. Therefore this was deferred.  Skin:    General: Skin is warm and dry.     Findings: No rash.  Neurological:     Mental Status: She is alert.     Comments: Clear speech.   Psychiatric:        Behavior: Behavior normal.      ED Treatments / Results  Labs (all labs ordered are listed, but only abnormal results are displayed) Labs Reviewed  BASIC METABOLIC PANEL - Abnormal; Notable for the following components:      Result Value   Potassium 2.9 (*)    CO2 21 (*)    Glucose, Bld 128 (*)    Creatinine, Ser 1.50 (*)    GFR calc non Af Amer 40 (*)    GFR calc Af Amer 47 (*)    All other components within normal limits  CBC WITH DIFFERENTIAL/PLATELET - Abnormal; Notable for the following components:   RBC 5.17 (*)     HCT 47.9 (*)    RDW 17.6 (*)    Neutro Abs 8.3 (*)    All other components within normal limits    EKG None  Radiology Ct Pelvis W Contrast  Result Date: 07/18/2019 CLINICAL DATA:  History of anal fissure and hemorrhoids. Rectal pain. EXAM: CT PELVIS WITH CONTRAST TECHNIQUE: Multidetector CT imaging of the pelvis was performed using the standard protocol following the bolus administration of intravenous contrast. CONTRAST:  75 mL OMNIPAQUE IOHEXOL 300 MG/ML  SOLN COMPARISON:  CT abdomen and pelvis 04/21/2019. FINDINGS: Urinary Tract:  No abnormality visualized. Bowel: Unremarkable visualized pelvic bowel loops. Nodule along the surface of the distal sigmoid colon seen on the prior CT scan is difficult to visualize on this examination. Vascular/Lymphatic: No pathologically enlarged lymph nodes. No significant vascular abnormality seen. Reproductive:  No mass or other significant abnormality Other: Perianal and perirectal soft tissues appear somewhat thickened but unchanged. No rim enhancing fluid collection is identified. There is no notable stranding in subcutaneous fat. Musculoskeletal: No acute or focal abnormality. IMPRESSION: Perianal and perirectal soft tissues appear mildly thickened but not grossly different than on the prior CT. No abscess is identified. Electronically Signed   By: Inge Rise M.D.   On: 07/18/2019 21:24    Procedures Procedures (including critical care time)  Medications Ordered in ED Medications - No data to display   Initial Impression / Assessment and Plan / ED Course  I have reviewed the triage vital signs and the nursing notes.  Pertinent labs & imaging results that were available during my care of the patient were reviewed by me and considered in my medical decision making (see chart for details).   Patient presents to the ED w/ complaints of rectal pain for the past few days.  Hx of hemorrhoids & anal fissure. On exam does have two external  hemorrhoids, 12:00 position  is very tender to palpation with some almost purulent but more thin appearing yellow drainage. DRE deferred as patient is very apprehensive about this and would like to avoid it at all possible. No bleeding. Will obtain basic labs & further assess w/ CT pelvis to r/o peri-rectal abscess.   CBC: No leukocytosis. No anemia.  BMP: hypokalemia- will orally replace. Elevated creatinine fairly similar to prior ranges.  CT pelvis: Perianal and perirectal soft tissues appear mildly thickened but not grossly different than on the prior CT. No abscess is identified.  Suspect hemorrhoid is primary cause of discomfort.  With yellow drainage that is almost purulent question infection of hemorrhoid? Will tx w/ topical lidocaine jelly & cover for infection w/ augmentin.  GI & general surgery follow up.  I discussed results, treatment plan, need for follow-up, and return precautions with the patient. Provided opportunity for questions, patient confirmed understanding and is in agreement with plan.     Findings and plan of care discussed with supervising physician Dr. Laverta Baltimore who is in agreement.   Final Clinical Impressions(s) / ED Diagnoses   Final diagnoses:  Hemorrhoids, unspecified hemorrhoid type  Hypokalemia    ED Discharge Orders         Ordered    amoxicillin-clavulanate (AUGMENTIN) 875-125 MG tablet  Every 12 hours     07/18/19 2144    lidocaine (XYLOCAINE) 2 % jelly  Status:  Discontinued     07/18/19 2144    potassium chloride SA (KLOR-CON) 20 MEQ tablet  Daily     07/18/19 2144    lidocaine (XYLOCAINE) 2 % jelly     07/18/19 2147           Camey Edell, [place] R, PA-C 07/18/19 2148    Margette Fast, MD 07/20/19 1143

## 2019-07-18 NOTE — Telephone Encounter (Signed)
Patient called in this am and when I called her back, she states she is hurting too bad to come in this morning. Wanted to know if we had an afternoon appt. I checked and told her we didn't. She asked about Mon. And I was able to reschedule her to [facility]. 07/21/19 at 10:30am with Ellouise Newer PA. She said she would go to the ED if she got worse over the weekend

## 2019-07-18 NOTE — Telephone Encounter (Signed)
Pt called stating that she is in a lot of pain and will not be able to make it to her appt with Nevin Bloodgood at 10:00am this morning, she is requesting an appt in the afternoon so the pain can subside a bit. Pls call her.

## 2019-07-18 NOTE — Telephone Encounter (Signed)
Called patient back and she is going to go to the ED when her brother gets off work and can take her

## 2019-07-18 NOTE — Telephone Encounter (Signed)
That is really unfortunate. If she is hurting too much to come to the office to have this evaluated she should go to the ED. She needs to have an exam to ensure she does not have an abscess, which will only get worse if she has this but does not get evaluated and get appropriate therapy. Can you call her and let her know? Thanks

## 2019-07-18 NOTE — ED Triage Notes (Signed)
States was treated for annal fissure  Then possible hemorrhoids  States is bubbling and purple

## 2019-07-18 NOTE — ED Notes (Signed)
Assisted PA with rectal exam

## 2019-07-18 NOTE — Discharge Instructions (Addendum)
You were seen in the emergency department today for rectal pain.  Your CT scan did not show any new abnormalities.  Your labs show that your potassium was low please be sure to include potassium rich food in your diet, we also send you home with a potassium supplement for a few days.  We are covering you for possible infection with Augmentin, an antibiotic, please take this as prescribed.  We are also sending home with topical lidocaine to put directly over the painful hemorrhoid area every 4 hours as needed.  We have prescribed you new medication(s) today. Discuss the medications prescribed today with your pharmacist as they can have adverse effects and interactions with your other medicines including over the counter and prescribed medications. Seek medical evaluation if you start to experience new or abnormal symptoms after taking one of these medicines, seek care immediately if you start to experience difficulty breathing, feeling of your throat closing, facial swelling, or rash as these could be indications of a more serious allergic reaction  Please follow-up with general surgery as well as GI within 3 days.  Return to the ER for new or worsening symptoms or any other concerns.

## 2019-07-19 ENCOUNTER — Telehealth (HOSPITAL_BASED_OUTPATIENT_CLINIC_OR_DEPARTMENT_OTHER): Payer: Self-pay | Admitting: Emergency Medicine

## 2019-07-21 ENCOUNTER — Inpatient Hospital Stay (HOSPITAL_COMMUNITY)
Admission: EM | Admit: 2019-07-21 | Discharge: 2019-07-23 | DRG: 312 | Disposition: A | Discharge status: Home / Self Care | Payer: Medicare HMO | Attending: Internal Medicine | Admitting: Internal Medicine

## 2019-07-21 ENCOUNTER — Emergency Department (HOSPITAL_COMMUNITY): Payer: Medicare HMO

## 2019-07-21 ENCOUNTER — Ambulatory Visit: Payer: Medicare HMO | Admitting: Physician Assistant

## 2019-07-21 ENCOUNTER — Other Ambulatory Visit: Payer: Self-pay

## 2019-07-21 ENCOUNTER — Encounter (HOSPITAL_COMMUNITY): Payer: Self-pay | Admitting: Family Medicine

## 2019-07-21 DIAGNOSIS — E340 Carcinoid syndrome: Secondary | ICD-10-CM | POA: Diagnosis not present

## 2019-07-21 DIAGNOSIS — Z923 Personal history of irradiation: Secondary | ICD-10-CM | POA: Diagnosis not present

## 2019-07-21 DIAGNOSIS — I454 Nonspecific intraventricular block: Secondary | ICD-10-CM | POA: Diagnosis not present

## 2019-07-21 DIAGNOSIS — I5189 Other ill-defined heart diseases: Secondary | ICD-10-CM

## 2019-07-21 DIAGNOSIS — I361 Nonrheumatic tricuspid (valve) insufficiency: Secondary | ICD-10-CM | POA: Diagnosis present

## 2019-07-21 DIAGNOSIS — R001 Bradycardia, unspecified: Secondary | ICD-10-CM | POA: Diagnosis not present

## 2019-07-21 DIAGNOSIS — D3A098 Benign carcinoid tumors of other sites: Secondary | ICD-10-CM | POA: Diagnosis not present

## 2019-07-21 DIAGNOSIS — L740 Miliaria rubra: Secondary | ICD-10-CM | POA: Diagnosis not present

## 2019-07-21 DIAGNOSIS — E876 Hypokalemia: Secondary | ICD-10-CM | POA: Diagnosis not present

## 2019-07-21 DIAGNOSIS — I447 Left bundle-branch block, unspecified: Secondary | ICD-10-CM | POA: Diagnosis present

## 2019-07-21 DIAGNOSIS — I420 Dilated cardiomyopathy: Secondary | ICD-10-CM | POA: Diagnosis not present

## 2019-07-21 DIAGNOSIS — Z8249 Family history of ischemic heart disease and other diseases of the circulatory system: Secondary | ICD-10-CM | POA: Diagnosis not present

## 2019-07-21 DIAGNOSIS — Z91048 Other nonmedicinal substance allergy status: Secondary | ICD-10-CM

## 2019-07-21 DIAGNOSIS — N183 Chronic kidney disease, stage 3 unspecified: Secondary | ICD-10-CM | POA: Diagnosis not present

## 2019-07-21 DIAGNOSIS — R69 Illness, unspecified: Secondary | ICD-10-CM

## 2019-07-21 DIAGNOSIS — I428 Other cardiomyopathies: Secondary | ICD-10-CM

## 2019-07-21 DIAGNOSIS — Z20828 Contact with and (suspected) exposure to other viral communicable diseases: Secondary | ICD-10-CM | POA: Diagnosis not present

## 2019-07-21 DIAGNOSIS — I371 Nonrheumatic pulmonary valve insufficiency: Secondary | ICD-10-CM | POA: Diagnosis not present

## 2019-07-21 DIAGNOSIS — C787 Secondary malignant neoplasm of liver and intrahepatic bile duct: Secondary | ICD-10-CM | POA: Diagnosis present

## 2019-07-21 DIAGNOSIS — R531 Weakness: Secondary | ICD-10-CM | POA: Diagnosis not present

## 2019-07-21 DIAGNOSIS — I5022 Chronic systolic (congestive) heart failure: Secondary | ICD-10-CM | POA: Diagnosis not present

## 2019-07-21 DIAGNOSIS — C50919 Malignant neoplasm of unspecified site of unspecified female breast: Secondary | ICD-10-CM | POA: Diagnosis not present

## 2019-07-21 DIAGNOSIS — Z888 Allergy status to other drugs, medicaments and biological substances status: Secondary | ICD-10-CM

## 2019-07-21 DIAGNOSIS — K649 Unspecified hemorrhoids: Secondary | ICD-10-CM | POA: Diagnosis present

## 2019-07-21 DIAGNOSIS — Z79899 Other long term (current) drug therapy: Secondary | ICD-10-CM

## 2019-07-21 DIAGNOSIS — K529 Noninfective gastroenteritis and colitis, unspecified: Secondary | ICD-10-CM | POA: Diagnosis present

## 2019-07-21 DIAGNOSIS — I130 Hypertensive heart and chronic kidney disease with heart failure and stage 1 through stage 4 chronic kidney disease, or unspecified chronic kidney disease: Secondary | ICD-10-CM | POA: Diagnosis present

## 2019-07-21 DIAGNOSIS — Z853 Personal history of malignant neoplasm of breast: Secondary | ICD-10-CM | POA: Diagnosis not present

## 2019-07-21 DIAGNOSIS — R55 Syncope and collapse: Principal | ICD-10-CM | POA: Diagnosis present

## 2019-07-21 DIAGNOSIS — Z03818 Encounter for observation for suspected exposure to other biological agents ruled out: Secondary | ICD-10-CM | POA: Diagnosis not present

## 2019-07-21 DIAGNOSIS — E669 Obesity, unspecified: Secondary | ICD-10-CM | POA: Diagnosis present

## 2019-07-21 DIAGNOSIS — I493 Ventricular premature depolarization: Secondary | ICD-10-CM

## 2019-07-21 DIAGNOSIS — Z9221 Personal history of antineoplastic chemotherapy: Secondary | ICD-10-CM | POA: Diagnosis not present

## 2019-07-21 DIAGNOSIS — Z6837 Body mass index (BMI) 37.0-37.9, adult: Secondary | ICD-10-CM

## 2019-07-21 DIAGNOSIS — Z9049 Acquired absence of other specified parts of digestive tract: Secondary | ICD-10-CM

## 2019-07-21 DIAGNOSIS — R000 Tachycardia, unspecified: Secondary | ICD-10-CM | POA: Diagnosis not present

## 2019-07-21 DIAGNOSIS — Z9012 Acquired absence of left breast and nipple: Secondary | ICD-10-CM | POA: Diagnosis not present

## 2019-07-21 DIAGNOSIS — I110 Hypertensive heart disease with heart failure: Secondary | ICD-10-CM | POA: Diagnosis present

## 2019-07-21 DIAGNOSIS — I362 Nonrheumatic tricuspid (valve) stenosis with insufficiency: Secondary | ICD-10-CM | POA: Diagnosis not present

## 2019-07-21 DIAGNOSIS — I491 Atrial premature depolarization: Secondary | ICD-10-CM | POA: Diagnosis not present

## 2019-07-21 LAB — CBC
HCT: 49.6 % — ABNORMAL HIGH (ref 36.0–46.0)
Hemoglobin: 15.3 g/dL — ABNORMAL HIGH (ref 12.0–15.0)
MCH: 29 pg (ref 26.0–34.0)
MCHC: 30.8 g/dL (ref 30.0–36.0)
MCV: 94.1 fL (ref 80.0–100.0)
Platelets: 325 10*3/uL (ref 150–400)
RBC: 5.27 MIL/uL — ABNORMAL HIGH (ref 3.87–5.11)
RDW: 17.2 % — ABNORMAL HIGH (ref 11.5–15.5)
WBC: 8.4 10*3/uL (ref 4.0–10.5)
nRBC: 0 % (ref 0.0–0.2)

## 2019-07-21 LAB — URINALYSIS, ROUTINE W REFLEX MICROSCOPIC
Glucose, UA: NEGATIVE mg/dL
Hgb urine dipstick: NEGATIVE
Ketones, ur: 20 mg/dL — AB
Nitrite: NEGATIVE
Protein, ur: 100 mg/dL — AB
Specific Gravity, Urine: 1.041 — ABNORMAL HIGH (ref 1.005–1.030)
pH: 5 (ref 5.0–8.0)

## 2019-07-21 LAB — BASIC METABOLIC PANEL
Anion gap: 11 (ref 5–15)
Anion gap: 11 (ref 5–15)
BUN: 12 mg/dL (ref 6–20)
BUN: 13 mg/dL (ref 6–20)
CO2: 24 mmol/L (ref 22–32)
CO2: 20 mmol/L — ABNORMAL LOW (ref 22–32)
Calcium: 9 mg/dL (ref 8.9–10.3)
Calcium: 8.8 mg/dL — ABNORMAL LOW (ref 8.9–10.3)
Chloride: 104 mmol/L (ref 98–111)
Chloride: 108 mmol/L (ref 98–111)
Creatinine, Ser: 1.42 mg/dL — ABNORMAL HIGH (ref 0.44–1.00)
Creatinine, Ser: 1.28 mg/dL — ABNORMAL HIGH (ref 0.44–1.00)
GFR calc Af Amer: 50 mL/min — ABNORMAL LOW (ref >60)
GFR calc Af Amer: 57 mL/min — ABNORMAL LOW (ref >60)
GFR calc non Af Amer: 43 mL/min — ABNORMAL LOW (ref >60)
GFR calc non Af Amer: 49 mL/min — ABNORMAL LOW (ref >60)
Glucose, Bld: 113 mg/dL — ABNORMAL HIGH (ref 70–99)
Glucose, Bld: 121 mg/dL — ABNORMAL HIGH (ref 70–99)
Potassium: 2.8 mmol/L — ABNORMAL LOW (ref 3.5–5.1)
Potassium: 3.5 mmol/L (ref 3.5–5.1)
Sodium: 139 mmol/L (ref 135–145)
Sodium: 139 mmol/L (ref 135–145)

## 2019-07-21 LAB — PROTIME-INR
INR: 1 (ref 0.8–1.2)
Prothrombin Time: 13.4 seconds (ref 11.4–15.2)

## 2019-07-21 LAB — MAGNESIUM
Magnesium: 1.5 mg/dL — ABNORMAL LOW (ref 1.7–2.4)
Magnesium: 2 mg/dL (ref 1.7–2.4)

## 2019-07-21 LAB — LACTIC ACID, PLASMA
Lactic Acid, Venous: 1.7 mmol/L (ref 0.5–1.9)
Lactic Acid, Venous: 0.9 mmol/L (ref 0.5–1.9)

## 2019-07-21 LAB — RAPID URINE DRUG SCREEN, HOSP PERFORMED
Amphetamines: NOT DETECTED
Barbiturates: NOT DETECTED
Benzodiazepines: NOT DETECTED
Cocaine: NOT DETECTED
Opiates: NOT DETECTED
Tetrahydrocannabinol: NOT DETECTED

## 2019-07-21 LAB — HEPATIC FUNCTION PANEL
ALT: 17 U/L (ref 0–44)
AST: 21 U/L (ref 15–41)
Albumin: 3.3 g/dL — ABNORMAL LOW (ref 3.5–5.0)
Alkaline Phosphatase: 77 U/L (ref 38–126)
Bilirubin, Direct: 0.2 mg/dL (ref 0.0–0.2)
Indirect Bilirubin: 1 mg/dL — ABNORMAL HIGH (ref 0.3–0.9)
Total Bilirubin: 1.2 mg/dL (ref 0.3–1.2)
Total Protein: 7.5 g/dL (ref 6.5–8.1)

## 2019-07-21 LAB — BRAIN NATRIURETIC PEPTIDE: B Natriuretic Peptide: 69.8 pg/mL (ref 0.0–100.0)

## 2019-07-21 LAB — TROPONIN I (HIGH SENSITIVITY)
Troponin I (High Sensitivity): 24 ng/L — ABNORMAL HIGH (ref <18)
Troponin I (High Sensitivity): 25 ng/L — ABNORMAL HIGH (ref <18)

## 2019-07-21 LAB — ETHANOL: Alcohol, Ethyl (B): <10 mg/dL (ref <10)

## 2019-07-21 LAB — I-STAT BETA HCG BLOOD, ED (MC, WL, AP ONLY): I-stat hCG, quantitative: <5 m[IU]/mL (ref <5)

## 2019-07-21 LAB — SARS CORONAVIRUS 2 (TAT 6-24 HRS): SARS Coronavirus 2: NEGATIVE

## 2019-07-21 MED ORDER — LIDOCAINE HCL URETHRAL/MUCOSAL 2 % EX GEL
Freq: Two times a day (BID) | CUTANEOUS | Status: DC | PRN
  Filled 2019-07-21: qty 30

## 2019-07-21 MED ORDER — MAGNESIUM CHLORIDE 64 MG PO TBEC
1.0000 | DELAYED_RELEASE_TABLET | Freq: Two times a day (BID) | ORAL | Status: DC
  Administered 2019-07-21 – 2019-07-23 (×3): 64 mg via ORAL
  Filled 2019-07-21 (×5): qty 1

## 2019-07-21 MED ORDER — POTASSIUM CHLORIDE CRYS ER 20 MEQ PO TBCR
40.0000 meq | EXTENDED_RELEASE_TABLET | Freq: Once | ORAL | Status: AC
  Administered 2019-07-21: 40 meq via ORAL
  Filled 2019-07-21: qty 2

## 2019-07-21 MED ORDER — SODIUM CHLORIDE 0.9% FLUSH
3.0000 mL | Freq: Once | INTRAVENOUS | Status: AC
  Administered 2019-07-21: 3 mL via INTRAVENOUS

## 2019-07-21 MED ORDER — ACETAMINOPHEN 325 MG PO TABS: 650.0000 mg | ORAL_TABLET | Freq: Four times a day (QID) | ORAL | Status: DC | PRN

## 2019-07-21 MED ORDER — AMOXICILLIN-POT CLAVULANATE 875-125 MG PO TABS
1.0000 | ORAL_TABLET | Freq: Two times a day (BID) | ORAL | Status: DC
  Administered 2019-07-21 – 2019-07-23 (×4): 1 via ORAL
  Filled 2019-07-21 (×4): qty 1

## 2019-07-21 MED ORDER — SODIUM CHLORIDE 0.9 % IV SOLN
INTRAVENOUS | Status: DC
  Administered 2019-07-21 – 2019-07-22 (×2): via INTRAVENOUS

## 2019-07-21 MED ORDER — MORPHINE SULFATE (PF) 2 MG/ML IV SOLN
2.0000 mg | INTRAVENOUS | Status: DC | PRN
  Administered 2019-07-22 (×2): 2 mg via INTRAVENOUS
  Filled 2019-07-21 (×3): qty 1

## 2019-07-21 MED ORDER — HYDROMORPHONE HCL 1 MG/ML IJ SOLN
1.0000 mg | Freq: Once | INTRAMUSCULAR | Status: AC
  Administered 2019-07-21: 1 mg via INTRAVENOUS
  Filled 2019-07-21: qty 1

## 2019-07-21 MED ORDER — SODIUM CHLORIDE 0.9 % IV SOLN: Freq: Once | INTRAVENOUS | Status: DC

## 2019-07-21 MED ORDER — POTASSIUM CHLORIDE 10 MEQ/100ML IV SOLN
10.0000 meq | Freq: Once | INTRAVENOUS | Status: AC
  Administered 2019-07-21: 10 meq via INTRAVENOUS
  Filled 2019-07-21: qty 100

## 2019-07-21 MED ORDER — HYDROCODONE-ACETAMINOPHEN 5-325 MG PO TABS
1.0000 | ORAL_TABLET | ORAL | Status: DC | PRN
  Administered 2019-07-21: 2 via ORAL
  Administered 2019-07-22: 1 via ORAL
  Administered 2019-07-22: 2 via ORAL
  Filled 2019-07-21 (×2): qty 2
  Filled 2019-07-21: qty 1

## 2019-07-21 MED ORDER — MAGNESIUM SULFATE 2 GM/50ML IV SOLN: 2.0000 g | INTRAVENOUS | Status: DC | PRN

## 2019-07-21 MED ORDER — METOPROLOL SUCCINATE ER 50 MG PO TB24
50.0000 mg | ORAL_TABLET | Freq: Every day | ORAL | Status: DC
  Administered 2019-07-22 – 2019-07-23 (×2): 50 mg via ORAL
  Filled 2019-07-21 (×2): qty 1

## 2019-07-21 MED ORDER — POTASSIUM CHLORIDE IN NACL 40-0.9 MEQ/L-% IV SOLN
INTRAVENOUS | Status: DC
  Administered 2019-07-21: 100 mL/h via INTRAVENOUS
  Filled 2019-07-21: qty 1000

## 2019-07-21 MED ORDER — POTASSIUM CHLORIDE IN NACL 20-0.9 MEQ/L-% IV SOLN
Freq: Once | INTRAVENOUS | Status: DC
  Filled 2019-07-21: qty 1000

## 2019-07-21 MED ORDER — ENOXAPARIN SODIUM 40 MG/0.4ML ~~LOC~~ SOLN
40.0000 mg | SUBCUTANEOUS | Status: DC
  Administered 2019-07-21 – 2019-07-22 (×2): 40 mg via SUBCUTANEOUS
  Filled 2019-07-21 (×3): qty 0.4

## 2019-07-21 MED ORDER — MORPHINE SULFATE (PF) 4 MG/ML IV SOLN
4.0000 mg | Freq: Once | INTRAVENOUS | Status: AC
  Administered 2019-07-21: 4 mg via INTRAVENOUS
  Filled 2019-07-21: qty 1

## 2019-07-21 MED ORDER — ACETAMINOPHEN 650 MG RE SUPP: 650.0000 mg | Freq: Four times a day (QID) | RECTAL | Status: DC | PRN

## 2019-07-21 MED ORDER — SODIUM CHLORIDE 0.9% FLUSH
3.0000 mL | Freq: Two times a day (BID) | INTRAVENOUS | Status: DC
  Administered 2019-07-22 – 2019-07-23 (×2): 3 mL via INTRAVENOUS

## 2019-07-21 MED ORDER — MAGNESIUM SULFATE 2 GM/50ML IV SOLN
2.0000 g | Freq: Once | INTRAVENOUS | Status: AC
  Administered 2019-07-21: 2 g via INTRAVENOUS
  Filled 2019-07-21: qty 50

## 2019-07-21 MED ORDER — POTASSIUM CHLORIDE CRYS ER 20 MEQ PO TBCR
40.0000 meq | EXTENDED_RELEASE_TABLET | Freq: Once | ORAL | Status: AC
  Administered 2019-07-22: 40 meq via ORAL
  Filled 2019-07-21: qty 2

## 2019-07-21 MED ORDER — SACUBITRIL-VALSARTAN 49-51 MG PO TABS
1.0000 | ORAL_TABLET | Freq: Two times a day (BID) | ORAL | Status: DC
  Administered 2019-07-21 – 2019-07-23 (×4): 1 via ORAL
  Filled 2019-07-21 (×6): qty 1

## 2019-07-21 MED ORDER — SPIRONOLACTONE 25 MG PO TABS
25.0000 mg | ORAL_TABLET | Freq: Every day | ORAL | Status: DC
  Administered 2019-07-21 – 2019-07-23 (×3): 25 mg via ORAL
  Filled 2019-07-21 (×3): qty 1

## 2019-07-21 NOTE — ED Provider Notes (Addendum)
[facility] EMERGENCY DEPARTMENT Provider Note   CSN: [id number] Arrival date & time: 07/21/19  1149     History   Chief Complaint Chief Complaint  Patient presents with  . Loss of Consciousness    HPI Amber Bautista is a 48 y.o. female.     HPI Amber Bautista is a 48 y.o. female with a hx of systolic heart failure, nonischemic cardiomyopathy, obesity, HTN, LBBB, metastatic carcinoid with liver metastases, stage IIb ductal carcinoma of left breast (s/p adjuvant chemotherapy with 4 cycles of carboplatinum/Taxotere).  Patient reports that she had been in the bathroom and had a bowel movement.  She reports she had finished going to the bathroom and had wiped.  She did not feel unwell.  She reports she got up and then started to feel lightheaded as though she might pass out.  She sat back down but then did go on to passed out and fell onto the bathroom floor.  She started to wake back up on the floor and called family members for help.  Her daughter mother came in and were assisting her and then when she started to sit up to get up again she again passed out and lost consciousness.  At that point they did call EMS and patient remained supine.  She reports she did not pass out again but continued to feel extremely weak and washed out.  Now by the time of reassessment, she is starting to feel back to normal.  She is however very worried about passing out 2 episodes in a row.  She does have significant cardiac history.  She reports that she has been doing much better since starting [facility] and working with cardiology.  She has not recently been having problems with chest pain, fever, cough.  She does unfortunately have metastatic hepatic carcinoid and will be undergoing treatment apparently next week. Past Medical History:  Diagnosis Date  . Abdominal pain 12/17/2014  . Breast cancer ([place]) 07/12/12   Stage IIB Ductal Carcinoma to  Upper Outer Quadrant: Left Breast: Triple  Negative  . Cancer ([place]) 03/03/2015   Overview:  in intestine-2007, in liver-2007, and left breast-2013  . Cancer of upper-outer quadrant of female breast ([place]) 08/01/2012   Image guided biopsy Oct., 2013 -    2 cm by MRI, solitary;     IDC; TNBC; positive LVI;      . Carcinoid tumor of intestine 2001   [place] 2007  . Carcinoma of breast treated with adjuvant chemotherapy ([place])    4 cycles of Carboplatin / Taxol  . Chronic congestive heart failure ([place]) 03/17/2019  . Congestive heart failure (CHF) ([place])   . Diarrhea    Sandostatin  . Frequent PVCs 04/01/2019  . Goals of care, counseling/discussion 03/31/2019  . Hypertension   . Metastatic carcinoid tumor ([place])   . Morbidly obese ([place])   . S/P radiation therapy 02/04/2013-02/21/2013   Left Breast and Axilla / 46.8 Gy in 26 fractions were planned (she only received 18 Gy in 10 fractions)    Patient Active Problem List   Diagnosis Date Noted  . [place] 06/20/2019  . Dilated cardiomyopathy ([place]) 04/02/2019  . Hypokalemia 04/02/2019  . Hypertensive heart disease with heart failure ([place]) 04/02/2019  . Frequent PVCs 04/01/2019  . Goals of care, counseling/discussion 03/31/2019  . Chronic systolic heart failure ([place]) 03/17/2019  . Cancer ([place]) 03/03/2015  . Abdominal pain 12/17/2014  . Breast cancer ([place])   . Cancer of upper-outer quadrant  of female breast ([place]) 08/01/2012  . Carcinoid tumor of intestine 03/18/2012    Past Surgical History:  Procedure Laterality Date  . BREAST SURGERY  08/23/12   Lt br lumpectomy  . [place] 2007  . COLON SURGERY  2001   Cancer/ Intestinal Resection  . OVARIAN CYST REMOVAL  2001  . PARTIAL MASTECTOMY WITH NEEDLE LOCALIZATION AND AXILLARY SENTINEL LYMPH NODE BX  08/23/2012   Procedure: PARTIAL MASTECTOMY WITH NEEDLE LOCALIZATION AND AXILLARY SENTINEL LYMPH NODE BX;  Surgeon: Adin Hector, MD;  Location: [facility];  Service: General;  Laterality: Left;  blue dye injection   . PORTACATH  PLACEMENT  08/23/2012   Procedure: INSERTION PORT-A-CATH;  Surgeon: Adin Hector, MD;  Location: [facility];  Service: General;  Laterality: Right;  intraop ultrasound     OB History   No obstetric history on file.      Home Medications    Prior to Admission medications   Medication Sig Start Date End Date Taking? Authorizing Provider  AMBULATORY NON FORMULARY MEDICATION Nitroglycerin 0.125% gel. Apply a pea size amount to the rectum every 8 hours 07/16/19   Armbruster, Carlota Raspberry, MD  amoxicillin-clavulanate (AUGMENTIN) 875-125 MG tablet Take 1 tablet by mouth every 12 (twelve) hours. 07/18/19   Petrucelli, Samantha R, PA-C  furosemide (LASIX) 20 MG tablet Take 1 tablet (20 mg total) by mouth daily. 04/02/19 07/01/19  Richardo Priest, MD  lidocaine (XYLOCAINE) 2 % jelly Apply to rectal area every 4 hours as needed for pain 07/18/19   Petrucelli, Samantha R, PA-C  magnesium chloride (SLOW-MAG) 64 MG TBEC SR tablet Take 1 tablet (64 mg total) by mouth 2 (two) times daily. 06/20/19   Loel Dubonnet, NP  metoprolol succinate (TOPROL-XL) 50 MG 24 hr tablet Take 1 tablet (50 mg total) by mouth daily. Take with or immediately following a meal. 06/25/19 09/23/19  Wendling, Crosby Oyster, DO  potassium chloride SA (K-DUR) 20 MEQ tablet Take 1 tablet (20 mEq total) by mouth 2 (two) times daily. 04/02/19   Richardo Priest, MD  potassium chloride SA (KLOR-CON) 20 MEQ tablet Take 1 tablet (20 mEq total) by mouth daily. 07/18/19   Petrucelli, Samantha R, PA-C  sacubitril-valsartan (ENTRESTO) 49-51 MG Take 1 tablet by mouth 2 (two) times daily. 04/02/19   Richardo Priest, MD  spironolactone (ALDACTONE) 25 MG tablet Take 1 tablet (25 mg total) by mouth daily. 04/02/19 07/01/19  Richardo Priest, MD    Family History Family History  Problem Relation Age of Onset  . Heart attack Father   . Diabetes Father   . Hypertension Mother   . Diabetes Mother   . Glaucoma Mother   . Hypertension Brother   . Asthma Brother    . Cancer Paternal Grandmother   . Colon cancer Neg Hx   . Esophageal cancer Neg Hx   . Rectal cancer Neg Hx   . Stomach cancer Neg Hx     Social History Social History   Tobacco Use  . Smoking status: Never Smoker  . Smokeless tobacco: Never Used  . Tobacco comment: NEVER SMOKED  Substance Use Topics  . Alcohol use: No    Alcohol/week: 0.0 standard drinks  . Drug use: No     Allergies   Sulfamethoxazole-trimethoprim and Adhesive [tape]   Review of Systems Review of Systems 10 Systems reviewed and are negative for acute change except as noted in the HPI.   Physical Exam Updated Vital Signs  BP (!) 154/95   Pulse 80   Temp 97.6 F (36.4 C)   Resp 17   Ht 5\' 6"  (1.676 m)   Wt 102.1 kg   LMP 08/18/2014 (LMP Unknown) Comment: menopausal: chemo induced  SpO2 98%   BMI 36.32 kg/m   Physical Exam Constitutional:      Appearance: She is well-developed.  HENT:     Head: Normocephalic and atraumatic.  Eyes:     Pupils: Pupils are equal, round, and reactive to light.  Neck:     Musculoskeletal: Neck supple.  Cardiovascular:     Rate and Rhythm: Normal rate and regular rhythm.     Heart sounds: Murmur present.  Pulmonary:     Effort: Pulmonary effort is normal.     Breath sounds: Normal breath sounds.  Abdominal:     General: Bowel sounds are normal. There is no distension.     Palpations: Abdomen is soft.     Tenderness: There is no abdominal tenderness.  Musculoskeletal: Normal range of motion.  Skin:    General: Skin is warm and dry.  Neurological:     Mental Status: She is alert and oriented to person, place, and time.     GCS: GCS eye subscore is 4. GCS verbal subscore is 5. GCS motor subscore is 6.     Coordination: Coordination normal.      ED Treatments / Results  Labs (all labs ordered are listed, but only abnormal results are displayed) Labs Reviewed  BASIC METABOLIC PANEL - Abnormal; Notable for the following components:      Result Value    Potassium 2.8 (*)    Glucose, Bld 113 (*)    Creatinine, Ser 1.42 (*)    GFR calc non Af Amer 43 (*)    GFR calc Af Amer 50 (*)    All other components within normal limits  CBC - Abnormal; Notable for the following components:   RBC 5.27 (*)    Hemoglobin 15.3 (*)    HCT 49.6 (*)    RDW 17.2 (*)    All other components within normal limits  URINALYSIS, ROUTINE W REFLEX MICROSCOPIC - Abnormal; Notable for the following components:   Color, Urine AMBER (*)    APPearance CLOUDY (*)    Specific Gravity, Urine 1.041 (*)    Bilirubin Urine MODERATE (*)    Ketones, ur 20 (*)    Protein, ur 100 (*)    Leukocytes,Ua SMALL (*)    Bacteria, UA RARE (*)    All other components within normal limits  HEPATIC FUNCTION PANEL - Abnormal; Notable for the following components:   Albumin 3.3 (*)    Indirect Bilirubin 1.0 (*)    All other components within normal limits  TROPONIN I (HIGH SENSITIVITY) - Abnormal; Notable for the following components:   Troponin I (High Sensitivity) 24 (*)    All other components within normal limits  SARS CORONAVIRUS 2 (TAT 6-24 HRS)  URINE CULTURE  LACTIC ACID, PLASMA  PROTIME-INR  ETHANOL  RAPID URINE DRUG SCREEN, HOSP PERFORMED  BRAIN NATRIURETIC PEPTIDE  LACTIC ACID, PLASMA  MAGNESIUM  I-STAT BETA HCG BLOOD, ED ([facility], [facility], [facility] ONLY)  CBG MONITORING, ED  TROPONIN I (HIGH SENSITIVITY)    EKG EKG Interpretation  Date/Time:  Monday July 21 2019 12:24:56 EDT Ventricular Rate:  85 PR Interval:  154 QRS Duration: 116 QT Interval:  402 QTC Calculation: 478 R Axis:   -23 Text Interpretation:  Normal sinus rhythm Right  atrial enlargement Left ventricular hypertrophy with QRS widening and repolarization abnormality ( R in aVL , Cornell product ) Abnormal ECG Confirmed by Pattricia Boss [phone]) on 07/21/2019 12:34:58 PM     Radiology Dg Chest Port 1 View  Result Date: 07/21/2019 CLINICAL DATA:  Syncope.  Breast cancer. EXAM: PORTABLE CHEST 1 VIEW  COMPARISON:  August 23, 2012 FINDINGS: Stable cardiomegaly. The hila, mediastinum, lungs, and pleura are unremarkable. IMPRESSION: No active disease. Electronically Signed   By: Dorise Bullion III M.D   On: 07/21/2019 13:27    Procedures Procedures (including critical care time)  Medications Ordered in ED Medications  potassium chloride 10 mEq in 100 mL IVPB (has no administration in time range)  potassium chloride SA (KLOR-CON) CR tablet 40 mEq (has no administration in time range)  0.9 % NaCl with KCl 20 mEq/ L  infusion (has no administration in time range)  sodium chloride flush (NS) 0.9 % injection 3 mL (3 mLs Intravenous Given 07/21/19 1320)  potassium chloride SA (KLOR-CON) CR tablet 40 mEq (40 mEq Oral Given 07/21/19 1445)  HYDROmorphone (DILAUDID) injection 1 mg (1 mg Intravenous Given 07/21/19 1445)     Initial Impression / Assessment and Plan / ED Course  I have reviewed the triage vital signs and the nursing notes.  Pertinent labs & imaging results that were available during my care of the patient were reviewed by me and considered in my medical decision making (see chart for details).  Clinical Course as of Jul 20 1621  Mon Jul 21, 2019  1509 Consult: Reviewed with Dr. Sallyanne Kuster of cardiology.  He will evaluate patient in the emergency department.   [MP]    Clinical Course User Index [MP] Charlesetta Shanks, MD      Patient has complex medical history with prior history of cardiomyopathy and reduced ejection fraction.  Last cardiology notes indicate that her EF had improved up to 40s to 50s.  Patient reports she has been doing actually quite well lately on her Entresto.  She has not been suffering from significant shortness of breath or chest pain.  She had syncopal episode as outlined today.  She also has significant comorbid illness of metastatic carcinoid syndrome.  EKG shows a left bundle branch block pattern.  She did have a brief episode of increased rate identified  by telemetry.  This is somewhat concerning for VT but also may be representative of the patient's underlying left bundle branch block.  Cardiology has been consulted and will evaluate the patient's rhythm strips and risk for cardiac etiology for syncope.  Final Clinical Impressions(s) / ED Diagnoses   Final diagnoses:  Syncope and collapse  Severe comorbid illness  Hypokalemia    ED Discharge Orders    None       Charlesetta Shanks, MD 07/21/19 1549    Charlesetta Shanks, MD 07/21/19 1622

## 2019-07-21 NOTE — ED Notes (Signed)
Unable to obtain orthostatic vital signs at this time.  Pt goes into V-tach when sitting up on side of bed.  Dr. Johnney Killian is aware.  Will continue to monitor

## 2019-07-21 NOTE — ED Notes (Signed)
Pt unable to tolerate NS with 40 mEQ of K.  Pt c/o IV burning really bad.  Pt has 2 IV sites.  Will let provider aware

## 2019-07-21 NOTE — H&P (Signed)
History and Physical  Patient Name: Amber Bautista     TWS:[id number]    DOB: September 05, 1971    DOA: 07/21/2019 PCP: Shelda Pal, DO  Patient coming from: Home  Chief Complaint: Syncope       HPI: Amber Bautista is a 48 y.o. 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 Marin Olp) who presented to the [facility] ER on 10/12 with reports of passing out x2.    The patient states she was in her usual state of health 10/12 until she went to the restroom and had a bowel movement.  After she cleaned up she went to stand and felt dizzy, as if she was going to "black out" and sat back down on the toilet to steady herself, then lost consciousness.  Woke up and her daughter/mother helped her sit up, then passed out a second time with some brief convulsions, no urinary incontinence or tongue biting.     ED course: In the ER, afebrile, & normotensive, mentating normally.  K 2.8, Mg 1.5, Sr Cr 1.42 (baseline 1.30-1.5), BNP 69, troponin 24, lactic acid 1.7 which cleared to 0.9, WBC 8.4, hgb 15.3 and platelets 325. CXR clear.  EKG with frequent PVCs. Cardiology were consulted, felt she had vagal episode.  COVID testing pending at time of admit.    ROS: Review of Systems  Gastrointestinal: Positive for diarrhea.  Neurological: Positive for loss of consciousness.  She denies fevers, chills, N/V.  Reports diarrhea is chronic.  Notes recent anal fissure which has healed > she believes is related to her Somatuline therapy which causes hard stools and she feels led to fissure.  She has been seen by Dr. Havery Moros recently for suspected (unable to examine) hemorrhoids and is on lidocaine topical + nitroglycerin.     Past Medical History:  Diagnosis Date  . Abdominal pain 12/17/2014  . Breast cancer  ([place]) 07/12/12   Stage IIB Ductal Carcinoma to  Upper Outer Quadrant: Left Breast: Triple Negative  . Cancer ([place]) 03/03/2015   Overview:  in intestine-2007, in liver-2007, and left breast-2013  . Cancer of upper-outer quadrant of female breast ([place]) 08/01/2012   Image guided biopsy Oct., 2013 -    2 cm by MRI, solitary;     IDC; TNBC; positive LVI;      . Carcinoid tumor of intestine 2001   Mets 2007  . Carcinoma of breast treated with adjuvant chemotherapy ([place])    4 cycles of Carboplatin / Taxol  . Chronic congestive heart failure ([place]) 03/17/2019  . Congestive heart failure (CHF) ([place])   . Diarrhea    Sandostatin  . Frequent PVCs 04/01/2019  . Goals of care, counseling/discussion 03/31/2019  . Hypertension   . Metastatic carcinoid tumor ([place])   . Morbidly obese ([place])   . S/P radiation therapy 02/04/2013-02/21/2013   Left Breast and Axilla / 46.8 Gy in 26 fractions were planned (she only received 18 Gy in 10 fractions)    Past Surgical History:  Procedure Laterality Date  . BREAST SURGERY  08/23/12   Lt br lumpectomy  . [place] 2007  . COLON SURGERY  2001   Cancer/ Intestinal Resection  . OVARIAN CYST REMOVAL  2001  . PARTIAL MASTECTOMY WITH NEEDLE LOCALIZATION AND AXILLARY SENTINEL LYMPH NODE BX  08/23/2012  Procedure: PARTIAL MASTECTOMY WITH NEEDLE LOCALIZATION AND AXILLARY SENTINEL LYMPH NODE BX;  Surgeon: Adin Hector, MD;  Location: [place];  Service: General;  Laterality: Left;  blue dye injection   . PORTACATH PLACEMENT  08/23/2012   Procedure: INSERTION PORT-A-CATH;  Surgeon: Adin Hector, MD;  Location: [place];  Service: General;  Laterality: Right;  intraop ultrasound    Social History: Patient lives at home with her 54 year old daughter.  The patient walks independently. Non-smoker.  Allergies  Allergen Reactions  . Sulfamethoxazole-Trimethoprim Swelling and Hypertension    Swelling and hypertension after taking, was admitted to hospital  .  Adhesive [Tape] Rash and Other (See Comments)    Causes SEVERE RASHES AND BLISTERS    Family history: Father heart disease, mother diabetes family history includes Asthma in her brother; Cancer in her paternal grandmother; Diabetes in her father and mother; Glaucoma in her mother; Heart attack in her father; Hypertension in her brother and mother.  Prior to Admission medications   Medication Sig Start Date End Date Taking? Authorizing Provider  AMBULATORY NON FORMULARY MEDICATION Nitroglycerin 0.125% gel. Apply a pea size amount to the rectum every 8 hours 07/16/19   Armbruster, Carlota Raspberry, MD  amoxicillin-clavulanate (AUGMENTIN) 875-125 MG tablet Take 1 tablet by mouth every 12 (twelve) hours. 07/18/19   Petrucelli, Samantha R, PA-C  furosemide (LASIX) 20 MG tablet Take 1 tablet (20 mg total) by mouth daily. 04/02/19 07/01/19  Richardo Priest, MD  lidocaine (XYLOCAINE) 2 % jelly Apply to rectal area every 4 hours as needed for pain 07/18/19   Petrucelli, Samantha R, PA-C  magnesium chloride (SLOW-MAG) 64 MG TBEC SR tablet Take 1 tablet (64 mg total) by mouth 2 (two) times daily. 06/20/19   Loel Dubonnet, NP  metoprolol succinate (TOPROL-XL) 50 MG 24 hr tablet Take 1 tablet (50 mg total) by mouth daily. Take with or immediately following a meal. 06/25/19 09/23/19  Wendling, Crosby Oyster, DO  potassium chloride SA (K-DUR) 20 MEQ tablet Take 1 tablet (20 mEq total) by mouth 2 (two) times daily. 04/02/19   Richardo Priest, MD  potassium chloride SA (KLOR-CON) 20 MEQ tablet Take 1 tablet (20 mEq total) by mouth daily. 07/18/19   Petrucelli, Samantha R, PA-C  sacubitril-valsartan (ENTRESTO) 49-51 MG Take 1 tablet by mouth 2 (two) times daily. 04/02/19   Richardo Priest, MD  spironolactone (ALDACTONE) 25 MG tablet Take 1 tablet (25 mg total) by mouth daily. 04/02/19 07/01/19  Richardo Priest, MD       Physical Exam: BP (!) 161/104   Pulse 92   Temp 97.6 F (36.4 C)   Resp 11   Ht 5\' 6"  (1.676 m)   Wt  102.1 kg   LMP 08/18/2014 (LMP Unknown) Comment: menopausal: chemo induced  SpO2 99%   BMI 36.32 kg/m  General appearance: Well-developed, adult female, alert and in no acute distress lying in bed, interactive.   Eyes: Anicteric, conjunctiva pink, lids and lashes normal. PERRL.    ENT: No nasal deformity, discharge, epistaxis.  Hearing intact. OP moist without lesions.   Neck: No neck masses.  Trachea midline.  No thyromegaly/tenderness. Lymph: No cervical or supraclavicular lymphadenopathy. Skin: Warm and dry.  No jaundice.  No suspicious rashes or lesions. Cardiac: RRR, distant S1-S2, no murmurs appreciated.  Capillary refill is brisk, <3sec.  JVP normal.  No LE edema.  Radial and pulses 2+ and symmetric. Respiratory: Normal respiratory rate and rhythm.  CTAB without rales  or wheezes. Abdomen: Abdomen soft.  No TTP or guarding. No ascites, distension, hepatosplenomegaly.   MSK: No deformities or effusions of the large joints of the upper or lower extremities bilaterally.  No cyanosis or clubbing. Neuro: Cranial nerves intact. Speech is fluent.  Muscle strength normal and symetric in upper and lower extremities. Psych: Sensorium intact and responding to questions, attention normal.  Behavior appropriate.  Affect normal.  Judgment and insight appear normal.     Labs on Admission:  I have personally reviewed following labs and imaging studies: CBC: Recent Labs  Lab 07/18/19 2006 07/21/19 1213  WBC 10.4 8.4  NEUTROABS 8.3*  --   HGB 14.6 15.3*  HCT 47.9* 49.6*  MCV 92.6 94.1  PLT 311 606   Basic Metabolic Panel: Recent Labs  Lab 07/18/19 2006 07/21/19 1213 07/21/19 1618  NA 138 139  --   K 2.9* 2.8*  --   CL 105 104  --   CO2 21* 24  --   GLUCOSE 128* 113*  --   BUN 16 12  --   CREATININE 1.50* 1.42*  --   CALCIUM 9.0 9.0  --   MG  --   --  1.5*   GFR: Estimated Creatinine Clearance: 57.8 mL/min (A) (by C-G formula based on SCr of 1.42 mg/dL (H)).  Liver Function  Tests: Recent Labs  Lab 07/21/19 1334  AST 21  ALT 17  ALKPHOS 77  BILITOT 1.2  PROT 7.5  ALBUMIN 3.3*   No results for input(s): LIPASE, AMYLASE in the last 168 hours. No results for input(s): AMMONIA in the last 168 hours. Coagulation Profile: Recent Labs  Lab 07/21/19 1334  INR 1.0    BNP (last 3 results) Recent Labs    04/09/19 1458 05/20/19 1611  PROBNP 469* 1,071*   Sepsis Labs: Lactic acid 1.7 cleared to 0.9      Radiological Exams on Admission: Images personally reviewed - CXR clear, cardiomegaly, stable. Dg Chest Port 1 View  Result Date: 07/21/2019 CLINICAL DATA:  Syncope.  Breast cancer. EXAM: PORTABLE CHEST 1 VIEW COMPARISON:  August 23, 2012 FINDINGS: Stable cardiomegaly. The hila, mediastinum, lungs, and pleura are unremarkable. IMPRESSION: No active disease. Electronically Signed   By: Dorise Bullion III M.D   On: 07/21/2019 13:27    EKG: Independently reviewed.  Frequent monophasic PVCs, chronic wide QRS      Assessment/Plan   Syncope Post voiding LOC associated with prodrome sounds vasovagal.  Cardiology have reviewed her telemetry carefully, and feel that she is not having ventricular tachycardia, no other arrhythmia provoked or loss of consciousness. -Telemetry overnight -Replete K/mag -Follow-up electrolytes in a.m. -Hydrate overnight -Check orthostatics -Cardiology consultation, appreciate recommendations     Chronic systolic CHF Nonischemic cardiomyopathy Carcinoid Heart Disease - PV, TV with dominant lesion Chest x-ray clear, appears euvolemic on exam dehydrated, BNP 69, negative troponin.  LVEF 45-50% in 05/2019 but may be overestimated given frequency of non-perfusing PVC's. ? If prior hx of chemotherapy is related to NICM.  Reportedly had a LHC 12/2018 in [place] with negative findings.  -Consult cardiology, appreciate recommendations -Hold Lasix tonight -Continue home metoprolol Entresto, spironolactone -Judicious fluids  overnight  Hypokalemia  Hypomagnesemia Reports being adherent to her home potassium and magnesium supplements -Has received 90 mEq of potassium already -Continue IV fluids with 40 of K overnight for total of 130 mEq  -Repeat BMP in the morning -Restart home K and Slow-Mag tomorrow    Hemorrhoids  Recently seen in the  ER with purulent drainage from a hemorrhoid, thought to be infected  -Continue home lidocaine -Continue sitz bath. -Continue Augmentin -Follow-up with GI as an outpatient  CKD III  Baseline ~1.3-1.5 -Daily BMP  Metastatic carcinoid Followed by Dr. Marin Olp, on Somatuline. Primary lesion thought to be pancreas.  Evidently this has been metastatic for some time, but she has been stable on somatuline.  Recent PET CT 05/2019 however showed mets to the liver, lymph nodes in upper abdomen, cardiac involvement  Hx Stage IIb Ductal Carcinoma  S/p carboplatin / Taxotere back in 2013.      DVT prophylaxis: Lovenox Code Status: Full Code   Family Communication: None present.   Disposition Plan: Plan for repletion of potassium and magnesium overnight, monitoring on telemetry.  Cardiology will ask electrophysiology to see tomorrow, if electrolytes replete, and no further recommendations from electrophysiology, likely home with oncology follow-up by tomorrow afternoon.  Consults called: Cardiology  Admission status: Observation    At the point of initial evaluation, it is my clinical opinion that admission for OBSERVATION is reasonable and necessary because the patient's presenting complaints in the context of their chronic conditions represent sufficient risk of deterioration or significant morbidity to constitute reasonable grounds for close observation in the hospital setting, but that the patient may be medically stable for discharge from the hospital within 24 to 48 hours.    Medical decision making: Patient seen at 6:11 PM on 07/21/2019.  The patient was discussed with  Dr. Wilson Singer.  What exists of the patient's chart was reviewed in depth and summarized above.  Clinical condition: stable.      Myrene Buddy, MD  [facility] Pager: please page via [email or web address]  Contact charge nurse if [employer] needed

## 2019-07-21 NOTE — ED Triage Notes (Signed)
Pt arrives via EMS from home with multiple syncope episodes this morning. Today had syncopal event while using the restroom. Pt fell on to tile floor. +LOC. No blood thinnners. Hx of active liver cancer. Blood noted in stools. EKG showed bigeminy with pvcs.  cbg 150.

## 2019-07-21 NOTE — Consult Note (Addendum)
Cardiology Consultation:   Patient ID: Amber Bautista; [id number]; April 12, 1971   Admit date: 07/21/2019 Date of Consult: 07/21/2019  Primary Care Provider: Shelda Pal, DO Primary Cardiologist: Dr. Bettina Gavia Primary Electrophysiologist:  Dr. Curt Bears   Patient Profile:   Amber Bautista Amber Bautista is a 48 y.o. female with a hx of HFrEF,non-ischemic cardiomyopathy, obesity, HTN, LBBB, metastatic carcinoid w/ liver mets, Stage 2b ductal carcinoma s/p chemo who is being seen today for the evaluation of syncope at the request of Dr.Pfeiffer.  History of Present Illness:   Ms. Amber Bautista was examined and evaluated at bedside in ED this pm. She states she was in her usual state of health until earlier today when she was getting up after a bowel movement in the bathroom and noted sudden light-headedness and 'feeling of blacking out' and sat back down on the toilet. Shortly after she found herself awake on the floor. Her family was called and while in front of her family, she passed out again and EMS was called. Her 58 year old daughter mentions seeing possible seizure activity. She denies any chest pain, palpitations, dyspnea. She denies any tongue-biting, urinary or bowel incontinence. She mentions she had similar episode in 04/2019 seen at [facility] found to have hypomagnesemia and suspected vasovagal syncope.  On chart review, she is followed by Dr.Munely for systolic heart failure. She has EF of 45-50 on Echo in 2020. She also is found to have septal hypokinesis consistent w/ her known LBBB. She also had a 7 day monitor for frequent PVCs and was found to have 7.4% PVC incidence. She mentions she had a left heart cath done in [facility] in 12/2018 and was told that she had no obstructive CAD.   In the ED, she was found to be hypokalemic with mildly elevated hsTrops. Cardiology was consulted for syncope.  Past Medical History:  Diagnosis Date  . Abdominal pain 12/17/2014  . Breast cancer ([place]) 07/12/12   Stage IIB Ductal Carcinoma to  Upper Outer Quadrant: Left Breast: Triple Negative  . Cancer (Amber Bautista) 03/03/2015   Overview:  in intestine-2007, in liver-2007, and left breast-2013  . Cancer of upper-outer quadrant of female breast ([place]) 08/01/2012   Image guided biopsy Oct., 2013 -    2 cm by MRI, solitary;     IDC; TNBC; positive LVI;      . Carcinoid tumor of intestine 2001   [place] 2007  . Carcinoma of breast treated with adjuvant chemotherapy ([place])    4 cycles of Carboplatin / Taxol  . Chronic congestive heart failure ([place]) 03/17/2019  . Congestive heart failure (CHF) ([place])   . Diarrhea    Sandostatin  . Frequent PVCs 04/01/2019  . Goals of care, counseling/discussion 03/31/2019  . Hypertension   . Metastatic carcinoid tumor ([place])   . Morbidly obese ([place])   . S/P radiation therapy 02/04/2013-02/21/2013   Left Breast and Axilla / 46.8 Gy in 26 fractions were planned (she only received 18 Gy in 10 fractions)    Past Surgical History:  Procedure Laterality Date  . BREAST SURGERY  08/23/12   Lt br lumpectomy  . [place] 2007  . COLON SURGERY  2001   Cancer/ Intestinal Resection  . OVARIAN CYST REMOVAL  2001  . PARTIAL MASTECTOMY WITH NEEDLE LOCALIZATION AND AXILLARY SENTINEL LYMPH NODE BX  08/23/2012   Procedure: PARTIAL MASTECTOMY WITH NEEDLE LOCALIZATION AND AXILLARY SENTINEL LYMPH NODE BX;  Surgeon: Adin Hector, MD;  Location: [place];  Service: General;  Laterality: Left;  blue dye injection   . PORTACATH PLACEMENT  08/23/2012   Procedure: INSERTION PORT-A-CATH;  Surgeon: Adin Hector, MD;  Location: [place];  Service: General;  Laterality: Right;  intraop ultrasound     Home Medications:  Prior to Admission medications   Medication Sig Start Date End Date Taking? Authorizing Provider  AMBULATORY NON FORMULARY MEDICATION Nitroglycerin 0.125% gel. Apply a pea size amount to the rectum every 8 hours 07/16/19   Armbruster, Carlota Raspberry, MD  amoxicillin-clavulanate  (AUGMENTIN) 875-125 MG tablet Take 1 tablet by mouth every 12 (twelve) hours. 07/18/19   Petrucelli, Samantha R, PA-C  furosemide (LASIX) 20 MG tablet Take 1 tablet (20 mg total) by mouth daily. 04/02/19 07/01/19  Richardo Priest, MD  lidocaine (XYLOCAINE) 2 % jelly Apply to rectal area every 4 hours as needed for pain 07/18/19   Petrucelli, Samantha R, PA-C  magnesium chloride (SLOW-MAG) 64 MG TBEC SR tablet Take 1 tablet (64 mg total) by mouth 2 (two) times daily. 06/20/19   Loel Dubonnet, NP  metoprolol succinate (TOPROL-XL) 50 MG 24 hr tablet Take 1 tablet (50 mg total) by mouth daily. Take with or immediately following a meal. 06/25/19 09/23/19  Wendling, Crosby Oyster, DO  potassium chloride SA (K-DUR) 20 MEQ tablet Take 1 tablet (20 mEq total) by mouth 2 (two) times daily. 04/02/19   Richardo Priest, MD  potassium chloride SA (KLOR-CON) 20 MEQ tablet Take 1 tablet (20 mEq total) by mouth daily. 07/18/19   Petrucelli, Samantha R, PA-C  sacubitril-valsartan (ENTRESTO) 49-51 MG Take 1 tablet by mouth 2 (two) times daily. 04/02/19   Richardo Priest, MD  spironolactone (ALDACTONE) 25 MG tablet Take 1 tablet (25 mg total) by mouth daily. 04/02/19 07/01/19  Richardo Priest, MD    Inpatient Medications: Scheduled Meds:  Continuous Infusions:  PRN Meds:   Allergies:    Allergies  Allergen Reactions  . Sulfamethoxazole-Trimethoprim Swelling and Hypertension    Swelling and hypertension after taking, was admitted to hospital  . Adhesive [Tape] Rash and Other (See Comments)    Causes SEVERE RASHES AND BLISTERS    Social History:   Social History   Socioeconomic History  . Marital status: Divorced    Spouse name: Not on file  . Number of children: Not on file  . Years of education: Not on file  . Highest education level: Not on file  Occupational History  . Not on file  Social Needs  . Financial resource strain: Not on file  . Food insecurity    Worry: Not on file    Inability: Not on  file  . Transportation needs    Medical: Not on file    Non-medical: Not on file  Tobacco Use  . Smoking status: Never Smoker  . Smokeless tobacco: Never Used  . Tobacco comment: NEVER SMOKED  Substance and Sexual Activity  . Alcohol use: No    Alcohol/week: 0.0 standard drinks  . Drug use: No  . Sexual activity: Not Currently    Comment: menarche age 40, P 3, G 1, 1 misscarriage  Lifestyle  . Physical activity    Days per week: Not on file    Minutes per session: Not on file  . Stress: Not on file  Relationships  . Social Herbalist on phone: Not on file    Gets together: Not on file    Attends religious service: Not on file    Active  member of club or organization: Not on file    Attends meetings of clubs or organizations: Not on file    Relationship status: Not on file  . Intimate partner violence    Fear of current or ex partner: Not on file    Emotionally abused: Not on file    Physically abused: Not on file    Forced sexual activity: Not on file  Other Topics Concern  . Not on file  Social History Narrative  . Not on file    Family History:   Significant cardiac hx of first degrees relatives Family History  Problem Relation Age of Onset  . Heart attack Father   . Diabetes Father   . Hypertension Mother   . Diabetes Mother   . Glaucoma Mother   . Hypertension Brother   . Asthma Brother   . Cancer Paternal Grandmother   . Colon cancer Neg Hx   . Esophageal cancer Neg Hx   . Rectal cancer Neg Hx   . Stomach cancer Neg Hx      ROS:  Please see the history of present illness.  Review of Systems  Constitution: Positive for malaise/fatigue. Negative for chills and fever.  Cardiovascular: Negative for chest pain and palpitations.  Respiratory: Negative for cough, shortness of breath and wheezing.   Gastrointestinal: Positive for constipation and diarrhea.     Physical Exam/Data:   Vitals:   07/21/19 1228 07/21/19 1318  BP: 122/78   Pulse:  94   Resp: (!) 22   Temp: 97.6 F (36.4 C)   SpO2: 99%   Weight:  102.1 kg  Height:  5\' 6"  (1.676 m)   No intake or output data in the 24 hours ending 07/21/19 1527 Filed Weights   07/21/19 1318  Weight: 102.1 kg   Body mass index is 36.32 kg/m.    Gen: Well-developed, obese, tearful HEENT: NCAT head, hearing intact, EOM Neck: supple, ROM intact, no JVD CV: RRR, S1, S2 normal, No rubs, no murmurs Pulm: CTAB, No rales, no wheezes Abd: Soft, BS+, NTND, No rebound, no guarding Extm: ROM intact, Peripheral pulses intact, No peripheral edema Skin: Dry, Warm, normal turgor  EKG:  The EKG was personally reviewed and demonstrates:  Normal sinus, ST changes esp w/ septal leads consistent w/ prior EKGs, LBBB.   Telemetry:  Telemetry was personally reviewed and demonstrates:  Normal sinus w/ multiple PVCs  Relevant CV Studies: 05/23/19 TTE IMPRESSIONS  1. Mild hypokinesis of the left ventricular, entire septal wall.  2. The left ventricle has mildly reduced systolic function, with an ejection fraction of 45-50%. The cavity size was normal. There is moderately increased left ventricular wall thickness. Left ventricular diastolic Doppler parameters are consistent with  impaired relaxation.  3. The right ventricle has normal systolic function. The cavity was normal. There is no increase in right ventricular wall thickness.  4. Right atrial size was mildly dilated.  5. Tricuspid valve regurgitation is moderate-severe.  6. Mild pulmonic stenosis.  7. The aorta is normal unless otherwise noted.  8. The inferior vena cava was normal in size with <50% respiratory variability.  9. TDS, cinsider MRI for better assesment of RV and TR  Laboratory Data:  Chemistry Recent Labs  Lab 07/18/19 2006 07/21/19 1213  NA 138 139  K 2.9* 2.8*  CL 105 104  CO2 21* 24  GLUCOSE 128* 113*  BUN 16 12  CREATININE 1.50* 1.42*  CALCIUM 9.0 9.0  GFRNONAA 40* 43*  GFRAA 47*  50*  ANIONGAP 12 11     Recent Labs  Lab 07/21/19 1334  PROT 7.5  ALBUMIN 3.3*  AST 21  ALT 17  ALKPHOS 77  BILITOT 1.2   Hematology Recent Labs  Lab 07/18/19 2006 07/21/19 1213  WBC 10.4 8.4  RBC 5.17* 5.27*  HGB 14.6 15.3*  HCT 47.9* 49.6*  MCV 92.6 94.1  MCH 28.2 29.0  MCHC 30.5 30.8  RDW 17.6* 17.2*  PLT 311 325   Cardiac EnzymesNo results for input(s): TROPONINI in the last 168 hours. No results for input(s): TROPIPOC in the last 168 hours.  BNP Recent Labs  Lab 07/21/19 1334  BNP 69.8    DDimer No results for input(s): DDIMER in the last 168 hours.  Radiology/Studies:  Ct Pelvis W Contrast  Result Date: 07/18/2019 CLINICAL DATA:  History of anal fissure and hemorrhoids. Rectal pain. EXAM: CT PELVIS WITH CONTRAST TECHNIQUE: Multidetector CT imaging of the pelvis was performed using the standard protocol following the bolus administration of intravenous contrast. CONTRAST:  75 mL OMNIPAQUE IOHEXOL 300 MG/ML  SOLN COMPARISON:  CT abdomen and pelvis 04/21/2019. FINDINGS: Urinary Tract:  No abnormality visualized. Bowel: Unremarkable visualized pelvic bowel loops. Nodule along the surface of the distal sigmoid colon seen on the prior CT scan is difficult to visualize on this examination. Vascular/Lymphatic: No pathologically enlarged lymph nodes. No significant vascular abnormality seen. Reproductive:  No mass or other significant abnormality Other: Perianal and perirectal soft tissues appear somewhat thickened but unchanged. No rim enhancing fluid collection is identified. There is no notable stranding in subcutaneous fat. Musculoskeletal: No acute or focal abnormality. IMPRESSION: Perianal and perirectal soft tissues appear mildly thickened but not grossly different than on the prior CT. No abscess is identified. Electronically Signed   By: Inge Rise M.D.   On: 07/18/2019 21:24   Dg Chest Port 1 View  Result Date: 07/21/2019 CLINICAL DATA:  Syncope.  Breast cancer. EXAM: PORTABLE CHEST 1  VIEW COMPARISON:  August 23, 2012 FINDINGS: Stable cardiomegaly. The hila, mediastinum, lungs, and pleura are unremarkable. IMPRESSION: No active disease. Electronically Signed   By: Dorise Bullion III M.D   On: 07/21/2019 13:27    Assessment and Plan:   Syncope Frequent PVCs LBBB Hypokalemia Presenting w/ recurrent syncope. Per chart review, known high PVC burden with outpatient plan to f/u with EP. Likely exacerbated by hypokalemia. Would benefit long term from ICD but given hx of metastatic ca, unclear long term prognosis. HsTrop 24. No ST changes on EKG. Description sounds vasovagal syncope. TTE in 05/2019 showing no valvular dysfunction. Low suspicion for ischemic event. Would not recommend aggressive cardiac work-up. - Replete K, Check Mag and replete if appropriate - C/w telemetry - Check orthostatic vitals - F/u UDS - If admitted, can be seen by EP  Chronic systolic heart failure Non-ischemic cardiomyopathy Hx of HFrEF with EF of 30% in 2013. Improved to 45-50% with medical management. BNP 70. Appear euvolemic this admission. Per patient, had cath done in 12/2018 with negative findings. - C/w telemetry - I&Os, Daily weights - C/w home meds: metoprolol 50mg  daily, Entresto 49-51 BID, spironolactone 25mg  daily  Carcinoid Tumor w/ Syndrome Follows w/ Dr.Ennever with Oncology. Complicated by chronic diarrhea and resulting hemorrhoids which is infected. Possible pancreas primary per recent PET scan. - Not on any cardiotoxic meds currently - receiving lanreotide (Somatuline) 120mg  IM monthly - F/u with oncology outpatient  Hx of Stage 2b Breast Ca Diagnosed ?8 years prior. Unclear chemo regimen at the  time. Currently in remission. Possible etiology of cardiomyopathy. - F/u with oncology  For questions or updates, please contact [place] Please consult [email or web address] for contact info under Cardiology/STEMI.   Signed, Gilberto Better, MD PGY-2, [place] [facility] Pager:  [phone] 07/21/2019 3:27 PM  Attending attestation to follow  I have seen and examined the patient along with Gilberto Better, MD.  I have reviewed the chart, notes and new data.  I agree with his note.  Key new complaints: Her syncopal events have multiple features suggesting vasovagal syncope.  She always has prodromal complaints and has noticed that sitting down or lying down helps the presyncopal symptoms better.  She has been having severe watery diarrhea for weeks related to her carcinoid syndrome.  She has 5-8 watery stools every day.  The most recent syncopal event happened early in the morning before she had a chance to eat or drink anything. Key examination changes: Morbidly obese.  Regular rate and rhythm with very frequent ectopic beats.  Early peaking systolic ejection murmur at the left upper sternal border radiating along the left clavicle all the way to her shoulder, barely audible decrescendo diastolic murmur of pulmonic insufficiency heard also at the left upper sternal border; 2/6 holosystolic murmur of tricuspid insufficiency at the left lower sternal border.  She does not have evidence of large V waves or hepatic pulsation. Key new findings / data: Reviewed her most recent echocardiogram.  Reviewed multiple electrocardiograms going back to 2013 and tracings from today, reviewed the outpatient arrhythmia monitor.  At baseline she has a broad QRS that does not quite meet criteria for left bundle branch block (160 ms).  With heart rates over roughly 90 bpm she tends to broaden the QRS further to 150 bpm, consistent with a complete left bundle branch block.  On top of this she has very frequent monomorphic monophasic premature ventricular contractions with an LBBB pattern and inferior axis, precordial transition V1-V2, consistent with RV outflow PVCs.   Reviewed the telemetry in the hospital.  She has not had ventricular tachycardia.  Rather, she has rate related left bundle branch block  that kicks in when her heart rate gets a little faster, for example when she sits up (I suspect she is still hypovolemic).  During the episodes of "wider" QRS complex tachycardia she continues to have the same RV outflow tract PVCs.  It is notable that on her echocardiogram the PVCs do not generate any output from the left or right ventricle.  Therefore, as many as 25% of all her heartbeats are ineffective.  She has very typical carcinoid changes of the tricuspid valve with thickened leaflets that are stuck in a half open position with evidence of mild tricuspid stenosis and severe tricuspid regurgitation, mild pulmonic stenosis and moderate pulmonic regurgitation.  In my opinion, the left ventricular ejection fraction is only roughly 35-40%.  There is some degree of dyssynchrony related to the intraventricular conduction delay and there is also paradoxical septal motion related to severe tricuspid insufficiency.  Right ventricular systolic function is hyperdynamic, appropriate for the degree of tricuspid regurgitation.  There is no echo evidence that would suggest she has arrhythmogenic right ventricular cardiomyopathy.  PLAN:  1.  Syncope, most likely vasovagal 2.  Frequent right ventricular outflow tract PVCs 3.  Rate related left bundle branch block 4.  Chronic left ventricular systolic dysfunction, without acute heart failure.  Etiology uncertain, consider PVC cardiomyopathy. 5.  Carcinoid heart disease with significant involvement of both the pulmonic and  tricuspid valves, dominant lesion is tricuspid insufficiency.  I do not think she had ventricular tachycardia as a cause of her syncope.  I do think she would benefit from an electrophysiology consultation to see if treatment of the PVCs could lead to improvement of left ventricular systolic function.  Recommend aggressive replacement of potassium.  Resume heart failure medications including Entresto, spironolactone and beta-blocker.    Sanda Klein, MD, [place] [phone] 07/21/2019, 5:20 PM

## 2019-07-22 ENCOUNTER — Telehealth: Payer: Self-pay

## 2019-07-22 ENCOUNTER — Encounter (HOSPITAL_COMMUNITY): Payer: Self-pay | Admitting: General Practice

## 2019-07-22 DIAGNOSIS — Z888 Allergy status to other drugs, medicaments and biological substances status: Secondary | ICD-10-CM | POA: Diagnosis not present

## 2019-07-22 DIAGNOSIS — Z9012 Acquired absence of left breast and nipple: Secondary | ICD-10-CM | POA: Diagnosis not present

## 2019-07-22 DIAGNOSIS — I362 Nonrheumatic tricuspid (valve) stenosis with insufficiency: Secondary | ICD-10-CM | POA: Diagnosis not present

## 2019-07-22 DIAGNOSIS — K529 Noninfective gastroenteritis and colitis, unspecified: Secondary | ICD-10-CM | POA: Diagnosis present

## 2019-07-22 DIAGNOSIS — L740 Miliaria rubra: Secondary | ICD-10-CM | POA: Diagnosis not present

## 2019-07-22 DIAGNOSIS — Z79899 Other long term (current) drug therapy: Secondary | ICD-10-CM | POA: Diagnosis not present

## 2019-07-22 DIAGNOSIS — I428 Other cardiomyopathies: Secondary | ICD-10-CM | POA: Diagnosis not present

## 2019-07-22 DIAGNOSIS — N183 Chronic kidney disease, stage 3 unspecified: Secondary | ICD-10-CM | POA: Diagnosis present

## 2019-07-22 DIAGNOSIS — I454 Nonspecific intraventricular block: Secondary | ICD-10-CM | POA: Diagnosis not present

## 2019-07-22 DIAGNOSIS — Z9221 Personal history of antineoplastic chemotherapy: Secondary | ICD-10-CM | POA: Diagnosis not present

## 2019-07-22 DIAGNOSIS — I447 Left bundle-branch block, unspecified: Secondary | ICD-10-CM | POA: Diagnosis present

## 2019-07-22 DIAGNOSIS — E669 Obesity, unspecified: Secondary | ICD-10-CM | POA: Diagnosis present

## 2019-07-22 DIAGNOSIS — Z20828 Contact with and (suspected) exposure to other viral communicable diseases: Secondary | ICD-10-CM | POA: Diagnosis present

## 2019-07-22 DIAGNOSIS — Z853 Personal history of malignant neoplasm of breast: Secondary | ICD-10-CM | POA: Diagnosis not present

## 2019-07-22 DIAGNOSIS — Z923 Personal history of irradiation: Secondary | ICD-10-CM | POA: Diagnosis not present

## 2019-07-22 DIAGNOSIS — E876 Hypokalemia: Secondary | ICD-10-CM

## 2019-07-22 DIAGNOSIS — I5189 Other ill-defined heart diseases: Secondary | ICD-10-CM | POA: Diagnosis not present

## 2019-07-22 DIAGNOSIS — Z91048 Other nonmedicinal substance allergy status: Secondary | ICD-10-CM | POA: Diagnosis not present

## 2019-07-22 DIAGNOSIS — I420 Dilated cardiomyopathy: Secondary | ICD-10-CM

## 2019-07-22 DIAGNOSIS — I5022 Chronic systolic (congestive) heart failure: Secondary | ICD-10-CM | POA: Diagnosis present

## 2019-07-22 DIAGNOSIS — E340 Carcinoid syndrome: Secondary | ICD-10-CM | POA: Diagnosis present

## 2019-07-22 DIAGNOSIS — I361 Nonrheumatic tricuspid (valve) insufficiency: Secondary | ICD-10-CM | POA: Diagnosis present

## 2019-07-22 DIAGNOSIS — Z8249 Family history of ischemic heart disease and other diseases of the circulatory system: Secondary | ICD-10-CM | POA: Diagnosis not present

## 2019-07-22 DIAGNOSIS — C787 Secondary malignant neoplasm of liver and intrahepatic bile duct: Secondary | ICD-10-CM | POA: Diagnosis present

## 2019-07-22 DIAGNOSIS — R55 Syncope and collapse: Secondary | ICD-10-CM | POA: Diagnosis present

## 2019-07-22 DIAGNOSIS — K649 Unspecified hemorrhoids: Secondary | ICD-10-CM | POA: Diagnosis present

## 2019-07-22 DIAGNOSIS — D3A098 Benign carcinoid tumors of other sites: Secondary | ICD-10-CM | POA: Diagnosis not present

## 2019-07-22 DIAGNOSIS — I130 Hypertensive heart and chronic kidney disease with heart failure and stage 1 through stage 4 chronic kidney disease, or unspecified chronic kidney disease: Secondary | ICD-10-CM | POA: Diagnosis present

## 2019-07-22 LAB — COMPREHENSIVE METABOLIC PANEL
ALT: 17 U/L (ref 0–44)
AST: 24 U/L (ref 15–41)
Albumin: 3 g/dL — ABNORMAL LOW (ref 3.5–5.0)
Alkaline Phosphatase: 78 U/L (ref 38–126)
Anion gap: 12 (ref 5–15)
BUN: 13 mg/dL (ref 6–20)
CO2: 20 mmol/L — ABNORMAL LOW (ref 22–32)
Calcium: 8.7 mg/dL — ABNORMAL LOW (ref 8.9–10.3)
Chloride: 106 mmol/L (ref 98–111)
Creatinine, Ser: 1.32 mg/dL — ABNORMAL HIGH (ref 0.44–1.00)
GFR calc Af Amer: 55 mL/min — ABNORMAL LOW (ref >60)
GFR calc non Af Amer: 47 mL/min — ABNORMAL LOW (ref >60)
Glucose, Bld: 114 mg/dL — ABNORMAL HIGH (ref 70–99)
Potassium: 3.2 mmol/L — ABNORMAL LOW (ref 3.5–5.1)
Sodium: 138 mmol/L (ref 135–145)
Total Bilirubin: 1.1 mg/dL (ref 0.3–1.2)
Total Protein: 6.4 g/dL — ABNORMAL LOW (ref 6.5–8.1)

## 2019-07-22 LAB — CBG MONITORING, ED: Glucose-Capillary: 96 mg/dL (ref 70–99)

## 2019-07-22 LAB — CBC
HCT: 42.8 % (ref 36.0–46.0)
Hemoglobin: 13.7 g/dL (ref 12.0–15.0)
MCH: 29.7 pg (ref 26.0–34.0)
MCHC: 32 g/dL (ref 30.0–36.0)
MCV: 92.8 fL (ref 80.0–100.0)
Platelets: 317 10*3/uL (ref 150–400)
RBC: 4.61 MIL/uL (ref 3.87–5.11)
RDW: 17.2 % — ABNORMAL HIGH (ref 11.5–15.5)
WBC: 8.4 10*3/uL (ref 4.0–10.5)
nRBC: 0 % (ref 0.0–0.2)

## 2019-07-22 LAB — HIV ANTIBODY (ROUTINE TESTING W REFLEX): HIV Screen 4th Generation wRfx: NONREACTIVE

## 2019-07-22 MED ORDER — POTASSIUM CHLORIDE CRYS ER 20 MEQ PO TBCR
40.0000 meq | EXTENDED_RELEASE_TABLET | Freq: Once | ORAL | Status: AC
  Administered 2019-07-22: 40 meq via ORAL
  Filled 2019-07-22: qty 2

## 2019-07-22 MED ORDER — LIDOCAINE 5 % EX OINT
TOPICAL_OINTMENT | Freq: Three times a day (TID) | CUTANEOUS | Status: DC | PRN
  Administered 2019-07-22: 17:00:00 via TOPICAL
  Filled 2019-07-22: qty 35.44

## 2019-07-22 MED ORDER — CHLORHEXIDINE GLUCONATE CLOTH 2 % EX PADS
6.0000 | MEDICATED_PAD | Freq: Every day | CUTANEOUS | Status: DC
  Administered 2019-07-22: 6 via TOPICAL

## 2019-07-22 NOTE — ED Notes (Signed)
Hospital bed ordered at this time for pt. Comfort.

## 2019-07-22 NOTE — ED Notes (Signed)
ED TO INPATIENT HANDOFF REPORT  ED Nurse Name and Phone #:   Amber Bautista 48 y.o. female Room/Bed: 012C/012C  Code Status   Code Status: Full Code  Home/SNF/Other Home Patient oriented to: self, place, time and situation Is this baseline? Yes   Triage Complete: Triage complete  Chief Complaint syncope  Triage Note Pt arrives via EMS from home with multiple syncope episodes this morning. Today had syncopal event while using the restroom. Pt fell on to tile floor. +LOC. No blood thinnners. Hx of active liver cancer. Blood noted in stools. EKG showed bigeminy with pvcs.  cbg 150.    Allergies Allergies  Allergen Reactions  . Sulfamethoxazole-Trimethoprim Swelling and Hypertension    Swelling and hypertension after taking, was admitted to hospital  . Adhesive [Tape] Rash and Other (See Comments)    Causes SEVERE RASHES AND BLISTERS    Level of Care/Admitting Diagnosis ED Disposition    ED Disposition Condition [place]: [place] [[id number]]  Level of Care: Telemetry Medical [104]  I expect the patient will be discharged within 24 hours: Yes  LOW acuity---Tx typically complete <24 hrs---ACUTE conditions typically can be evaluated <24 hours---LABS likely to return to acceptable levels <24 hours---IS near functional baseline---EXPECTED to return to current living arrangement---NOT newly hypoxic: Meets criteria for 5C-Observation unit  Covid Evaluation: Asymptomatic Screening Protocol (No Symptoms)  Diagnosis: Syncope [206001]  Admitting Physician: Edwin Dada [[id number]]  Attending Physician: Edwin Dada [[id number]]  PT Class (Do Not Modify): Observation [104]  PT Acc Code (Do Not Modify): Observation [10022]       B Medical/Surgery History Past Medical History:  Diagnosis Date  . Abdominal pain 12/17/2014  . Breast cancer ([place]) 07/12/12   Stage IIB Ductal Carcinoma to  Upper Outer Quadrant:  Left Breast: Triple Negative  . Cancer ([place]) 03/03/2015   Overview:  in intestine-2007, in liver-2007, and left breast-2013  . Cancer of upper-outer quadrant of female breast ([place]) 08/01/2012   Image guided biopsy Oct., 2013 -    2 cm by MRI, solitary;     IDC; TNBC; positive LVI;      . Carcinoid tumor of intestine 2001   [place] 2007  . Carcinoma of breast treated with adjuvant chemotherapy ([place])    4 cycles of Carboplatin / Taxol  . Chronic congestive heart failure ([place]) 03/17/2019  . Congestive heart failure (CHF) ([place])   . Diarrhea    Sandostatin  . Frequent PVCs 04/01/2019  . Goals of care, counseling/discussion 03/31/2019  . Hypertension   . Metastatic carcinoid tumor ([place])   . Morbidly obese ([place])   . Amber/P radiation therapy 02/04/2013-02/21/2013   Left Breast and Axilla / 46.8 Gy in 26 fractions were planned (she only received 18 Gy in 10 fractions)   Past Surgical History:  Procedure Laterality Date  . BREAST SURGERY  08/23/12   Lt br lumpectomy  . [place] 2007  . COLON SURGERY  2001   Cancer/ Intestinal Resection  . OVARIAN CYST REMOVAL  2001  . PARTIAL MASTECTOMY WITH NEEDLE LOCALIZATION AND AXILLARY SENTINEL LYMPH NODE BX  08/23/2012   Procedure: PARTIAL MASTECTOMY WITH NEEDLE LOCALIZATION AND AXILLARY SENTINEL LYMPH NODE BX;  Surgeon: Adin Hector, MD;  Location: [facility];  Service: General;  Laterality: Left;  blue dye injection   . PORTACATH PLACEMENT  08/23/2012   Procedure: INSERTION PORT-A-CATH;  Surgeon: Adin Hector, MD;  Location: [facility]  OR;  Service: General;  Laterality: Right;  intraop ultrasound     A IV Location/Drains/Wounds Patient Lines/Drains/Airways Status   Active Line/Drains/Airways    Name:   Placement date:   Placement time:   Site:   Days:   Implanted Port 08/23/12 Right    08/23/12    1310    -   2524   Peripheral IV 07/21/19 Left;Upper Arm   07/21/19    1320    Arm   1   Peripheral IV 07/21/19 Right;Anterior Forearm   07/21/19     1650    Forearm   1   Incision 08/23/12 Chest Right   08/23/12    1433     2524   Incision 08/23/12 Breast Left   08/23/12    1433     2524   Incision 08/23/12 Axilla Left   08/23/12    1433     2524          Intake/Output Last 24 hours  Intake/Output Summary (Last 24 hours) at 07/22/2019 1230 Last data filed at 07/21/2019 1920 Gross per 24 hour  Intake 102.51 ml  Output -  Net 102.51 ml    Labs/Imaging Results for orders placed or performed during the hospital encounter of 07/21/19 (from the past 48 hour(Amber))  Basic metabolic panel     Status: Abnormal   Collection Time: 07/21/19 12:13 PM  Result Value Ref Range   Sodium 139 135 - 145 mmol/L   Potassium 2.8 (L) 3.5 - 5.1 mmol/L   Chloride 104 98 - 111 mmol/L   CO2 24 22 - 32 mmol/L   Glucose, Bld 113 (H) 70 - 99 mg/dL   BUN 12 6 - 20 mg/dL   Creatinine, Ser 1.42 (H) 0.44 - 1.00 mg/dL   Calcium 9.0 8.9 - 10.3 mg/dL   GFR calc non Af Amer 43 (L) >60 mL/min   GFR calc Af Amer 50 (L) >60 mL/min   Anion gap 11 5 - 15    Comment: Performed at [facility], [address], [place], [place] [address]  CBC     Status: Abnormal   Collection Time: 07/21/19 12:13 PM  Result Value Ref Range   WBC 8.4 4.0 - 10.5 K/uL   RBC 5.27 (H) 3.87 - 5.11 MIL/uL   Hemoglobin 15.3 (H) 12.0 - 15.0 g/dL   HCT 49.6 (H) 36.0 - 46.0 %   MCV 94.1 80.0 - 100.0 fL   MCH 29.0 26.0 - 34.0 pg   MCHC 30.8 30.0 - 36.0 g/dL   RDW 17.2 (H) 11.5 - 15.5 %   Platelets 325 150 - 400 K/uL   nRBC 0.0 0.0 - 0.2 %    Comment: Performed at [facility] [address], [place], [place] [address]  I-Stat beta hCG blood, ED     Status: None   Collection Time: 07/21/19  1:14 PM  Result Value Ref Range   I-stat hCG, quantitative <5.0 <5 mIU/mL   Comment 3            Comment:   GEST. AGE      CONC.  (mIU/mL)   <=1 WEEK        5 - 50     2 WEEKS       50 - 500     3 WEEKS       100 - 10,000     4 WEEKS     1,000 - 30,000  FEMALE AND NON-PREGNANT  FEMALE:     LESS THAN 5 mIU/mL   Troponin I (High Sensitivity)     Status: Abnormal   Collection Time: 07/21/19  1:34 PM  Result Value Ref Range   Troponin I (High Sensitivity) 24 (H) <18 ng/L    Comment: (NOTE) Elevated high sensitivity troponin I (hsTnI) values and significant  changes across serial measurements may suggest ACS but many other  chronic and acute conditions are known to elevate hsTnI results.  Refer to the "Links" section for chest pain algorithms and additional  guidance. Performed at [facility], [place] [address], [place], [place] [address]   Lactic acid, plasma     Status: None   Collection Time: 07/21/19  1:34 PM  Result Value Ref Range   Lactic Acid, Venous 1.7 0.5 - 1.9 mmol/L    Comment: Performed at [facility] [address] [place], [place] [address]  Protime-INR     Status: None   Collection Time: 07/21/19  1:34 PM  Result Value Ref Range   Prothrombin Time 13.4 11.4 - 15.2 seconds   INR 1.0 0.8 - 1.2    Comment: (NOTE) INR goal varies based on device and disease states. Performed at [facility], [place] [address], [place], [place] [address]   Hepatic function panel     Status: Abnormal   Collection Time: 07/21/19  1:34 PM  Result Value Ref Range   Total Protein 7.5 6.5 - 8.1 g/dL   Albumin 3.3 (L) 3.5 - 5.0 g/dL   AST 21 15 - 41 U/L   ALT 17 0 - 44 U/L   Alkaline Phosphatase 77 38 - 126 U/L   Total Bilirubin 1.2 0.3 - 1.2 mg/dL   Bilirubin, Direct 0.2 0.0 - 0.2 mg/dL   Indirect Bilirubin 1.0 (H) 0.3 - 0.9 mg/dL    Comment: Performed at [facility] [address] [place], [place] [address]  Ethanol     Status: None   Collection Time: 07/21/19  1:34 PM  Result Value Ref Range   Alcohol, Ethyl (B) <10 <10 mg/dL    Comment: (NOTE) Lowest detectable limit for serum alcohol is 10 mg/dL. For medical purposes only. Performed at [facility], [place] [address], [place], [place] [address]   Brain natriuretic peptide      Status: None   Collection Time: 07/21/19  1:34 PM  Result Value Ref Range   B Natriuretic Peptide 69.8 0.0 - 100.0 pg/mL    Comment: Performed at [facility] [address] [place], [place] [address]  SARS CORONAVIRUS 2 (TAT 6-24 HRS) Nasopharyngeal Nasopharyngeal Swab     Status: None   Collection Time: 07/21/19  1:40 PM   Specimen: Nasopharyngeal Swab  Result Value Ref Range   SARS Coronavirus 2 NEGATIVE NEGATIVE    Comment: (NOTE) SARS-CoV-2 target nucleic acids are NOT DETECTED. The SARS-CoV-2 RNA is generally detectable in upper and lower respiratory specimens during the acute phase of infection. Negative results do not preclude SARS-CoV-2 infection, do not rule out co-infections with other pathogens, and should not be used as the sole basis for treatment or other patient management decisions. Negative results must be combined with clinical observations, patient history, and epidemiological information. The expected result is Negative. Fact Sheet for Patients: SugarRoll.be Fact Sheet for Healthcare Providers: https://www.woods-mathews.com/ This test is not yet approved or cleared by the [place] FDA and  has been authorized for  detection and/or diagnosis of SARS-CoV-2 by FDA under an Emergency Use Authorization (EUA). This EUA will remain  in effect (meaning this test can be used) for the duration of the COVID-19 declaration under Section 56 4(b)(1) of the Act, 21 U.[place].C. section 360bbb-3(b)(1), unless the authorization is terminated or revoked sooner. Performed at [facility], [place] [address], [place], [place] [address]   Urinalysis, Routine w reflex microscopic     Status: Abnormal   Collection Time: 07/21/19  2:51 PM  Result Value Ref Range   Color, Urine AMBER (A) YELLOW    Comment: BIOCHEMICALS MAY BE AFFECTED BY COLOR   APPearance CLOUDY (A) CLEAR   Specific Gravity, Urine 1.041 (H) 1.005 - 1.030   pH  5.0 5.0 - 8.0   Glucose, UA NEGATIVE NEGATIVE mg/dL   Hgb urine dipstick NEGATIVE NEGATIVE   Bilirubin Urine MODERATE (A) NEGATIVE   Ketones, ur 20 (A) NEGATIVE mg/dL   Protein, ur 100 (A) NEGATIVE mg/dL   Nitrite NEGATIVE NEGATIVE   Leukocytes,Ua SMALL (A) NEGATIVE   RBC / HPF 0-5 0 - 5 RBC/hpf   WBC, UA 6-10 0 - 5 WBC/hpf   Bacteria, UA RARE (A) NONE SEEN   Squamous Epithelial / LPF 6-10 0 - 5   Mucus PRESENT    Hyaline Casts, UA PRESENT    Granular Casts, UA PRESENT    Ca Oxalate Crys, UA PRESENT     Comment: Performed at [facility], [place] [address], [place], [place] [address]  Urine rapid drug screen (hosp performed)     Status: None   Collection Time: 07/21/19  2:51 PM  Result Value Ref Range   Opiates NONE DETECTED NONE DETECTED   Cocaine NONE DETECTED NONE DETECTED   Benzodiazepines NONE DETECTED NONE DETECTED   Amphetamines NONE DETECTED NONE DETECTED   Tetrahydrocannabinol NONE DETECTED NONE DETECTED   Barbiturates NONE DETECTED NONE DETECTED    Comment: (NOTE) DRUG SCREEN FOR MEDICAL PURPOSES ONLY.  IF CONFIRMATION IS NEEDED FOR ANY PURPOSE, NOTIFY LAB WITHIN 5 DAYS. LOWEST DETECTABLE LIMITS FOR URINE DRUG SCREEN Drug Class                     Cutoff (ng/mL) Amphetamine and metabolites    1000 Barbiturate and metabolites    200 Benzodiazepine                 098 Tricyclics and metabolites     300 Opiates and metabolites        300 Cocaine and metabolites        300 THC                            50 Performed at [facility], [place] [address] [place], [place] [address]   Lactic acid, plasma     Status: None   Collection Time: 07/21/19  4:18 PM  Result Value Ref Range   Lactic Acid, Venous 0.9 0.5 - 1.9 mmol/L    Comment: Performed at [facility] [address] [place], [place] [address]  Troponin I (High Sensitivity)     Status: Abnormal   Collection Time: 07/21/19  4:18 PM  Result Value Ref Range   Troponin I (High Sensitivity) 25 (H)  <18 ng/L    Comment: (NOTE) Elevated high sensitivity troponin I (hsTnI) values and significant  changes across serial measurements may suggest ACS but many other  chronic and acute conditions are known to elevate hsTnI results.  Refer to the "Links" section for chest pain algorithms and additional  guidance. Performed at [facility], [place] [address] [place], [place] [address]   Magnesium     Status: Abnormal   Collection Time: 07/21/19  4:18 PM  Result Value Ref Range   Magnesium 1.5 (L) 1.7 - 2.4 mg/dL    Comment: Performed at [facility] [address], [place], [place] [address]  Basic metabolic panel     Status: Abnormal   Collection Time: 07/21/19  9:03 PM  Result Value Ref Range   Sodium 139 135 - 145 mmol/L   Potassium 3.5 3.5 - 5.1 mmol/L   Chloride 108 98 - 111 mmol/L   CO2 20 (L) 22 - 32 mmol/L   Glucose, Bld 121 (H) 70 - 99 mg/dL   BUN 13 6 - 20 mg/dL   Creatinine, Ser 1.28 (H) 0.44 - 1.00 mg/dL   Calcium 8.8 (L) 8.9 - 10.3 mg/dL   GFR calc non Af Amer 49 (L) >60 mL/min   GFR calc Af Amer 57 (L) >60 mL/min   Anion gap 11 5 - 15    Comment: Performed at [facility] [address], [place], [place] [address]  Magnesium     Status: None   Collection Time: 07/21/19  9:03 PM  Result Value Ref Range   Magnesium 2.0 1.7 - 2.4 mg/dL    Comment: Performed at [facility] [address] [place], [place] [address]  HIV Antibody (routine testing w rflx)     Status: None   Collection Time: 07/22/19  2:50 AM  Result Value Ref Range   HIV Screen 4th Generation wRfx NON REACTIVE NON REACTIVE    Comment: Performed at [facility] [address] [place], [place] [address]  CBC     Status: Abnormal   Collection Time: 07/22/19  2:50 AM  Result Value Ref Range   WBC 8.4 4.0 - 10.5 K/uL   RBC 4.61 3.87 - 5.11 MIL/uL   Hemoglobin 13.7 12.0 - 15.0 g/dL   HCT 42.8 36.0 - 46.0 %   MCV 92.8 80.0 - 100.0 fL   MCH 29.7 26.0 - 34.0 pg   MCHC 32.0  30.0 - 36.0 g/dL   RDW 17.2 (H) 11.5 - 15.5 %   Platelets 317 150 - 400 K/uL   nRBC 0.0 0.0 - 0.2 %    Comment: Performed at [facility], [place] [address] [place], [place] [address]  Comprehensive metabolic panel     Status: Abnormal   Collection Time: 07/22/19  2:50 AM  Result Value Ref Range   Sodium 138 135 - 145 mmol/L   Potassium 3.2 (L) 3.5 - 5.1 mmol/L   Chloride 106 98 - 111 mmol/L   CO2 20 (L) 22 - 32 mmol/L   Glucose, Bld 114 (H) 70 - 99 mg/dL   BUN 13 6 - 20 mg/dL   Creatinine, Ser 1.32 (H) 0.44 - 1.00 mg/dL   Calcium 8.7 (L) 8.9 - 10.3 mg/dL   Total Protein 6.4 (L) 6.5 - 8.1 g/dL   Albumin 3.0 (L) 3.5 - 5.0 g/dL   AST 24 15 - 41 U/L   ALT 17 0 - 44 U/L   Alkaline Phosphatase 78 38 - 126 U/L   Total Bilirubin 1.1 0.3 - 1.2 mg/dL   GFR calc non Af Amer 47 (L) >  60 mL/min   GFR calc Af Amer 55 (L) >60 mL/min   Anion gap 12 5 - 15    Comment: Performed at [facility] [address] [place], [place] [address]  CBG monitoring, ED     Status: None   Collection Time: 07/22/19  7:46 AM  Result Value Ref Range   Glucose-Capillary 96 70 - 99 mg/dL   Comment 1 Notify RN    Comment 2 Document in Chart    Dg Chest Port 1 View  Result Date: 07/21/2019 CLINICAL DATA:  Syncope.  Breast cancer. EXAM: PORTABLE CHEST 1 VIEW COMPARISON:  August 23, 2012 FINDINGS: Stable cardiomegaly. The hila, mediastinum, lungs, and pleura are unremarkable. IMPRESSION: No active disease. Electronically Signed   By: Dorise Bullion III M.D   On: 07/21/2019 13:27    Pending Labs Unresulted Labs (From admission, onward)    Start     Ordered   07/28/19 0500  Creatinine, serum  (enoxaparin (LOVENOX)    CrCl >/= 30 ml/min)  Weekly,   R    Comments: while on enoxaparin therapy    07/21/19 1821   07/22/19 0500  HIV4GL Save Tube  (HIV Antibody (Routine testing w reflex) panel)  Tomorrow morning,   R     07/21/19 1821   07/21/19 1617  Urine culture  ONCE - STAT,   STAT     07/21/19 1616           Vitals/Pain Today'Amber Vitals   07/22/19 0900 07/22/19 0930 07/22/19 1015 07/22/19 1100  BP: 109/66 (!) 159/101 (!) 146/89 (!) 143/93  Pulse: 88 93 87 80  Resp: (!) 23 17 17 17   Temp:      SpO2: 99% 96% 99% 96%  Weight:      Height:      PainSc:        Isolation Precautions No active isolations  Medications Medications  amoxicillin-clavulanate (AUGMENTIN) 875-125 MG per tablet 1 tablet (1 tablet Oral Given 07/22/19 0643)  metoprolol succinate (TOPROL-XL) 24 hr tablet 50 mg (50 mg Oral Not Given 07/22/19 0031)  sacubitril-valsartan (ENTRESTO) 49-51 mg per tablet (1 tablet Oral Given 07/21/19 2137)  spironolactone (ALDACTONE) tablet 25 mg (25 mg Oral Given 07/21/19 2137)  magnesium chloride (SLOW-MAG) 64 MG SR tablet 64 mg (64 mg Oral Given 07/21/19 2137)  lidocaine (XYLOCAINE) 2 % jelly (has no administration in time range)  sodium chloride flush (NS) 0.9 % injection 3 mL (3 mLs Intravenous Not Given 07/21/19 2200)  enoxaparin (LOVENOX) injection 40 mg (40 mg Subcutaneous Given 07/21/19 2136)  acetaminophen (TYLENOL) tablet 650 mg (has no administration in time range)    Or  acetaminophen (TYLENOL) suppository 650 mg (has no administration in time range)  HYDROcodone-acetaminophen (NORCO/VICODIN) 5-325 MG per tablet 1-2 tablet (2 tablets Oral Given 07/22/19 0643)  morphine 2 MG/ML injection 2 mg (has no administration in time range)  potassium chloride SA (KLOR-CON) CR tablet 40 mEq (has no administration in time range)  sodium chloride flush (NS) 0.9 % injection 3 mL (3 mLs Intravenous Given 07/21/19 1320)  potassium chloride SA (KLOR-CON) CR tablet 40 mEq (40 mEq Oral Given 07/21/19 1445)  HYDROmorphone (DILAUDID) injection 1 mg (1 mg Intravenous Given 07/21/19 1445)  potassium chloride 10 mEq in 100 mL IVPB (0 mEq Intravenous Stopped 07/21/19 1806)  potassium chloride SA (KLOR-CON) CR tablet 40 mEq (40 mEq Oral Given 07/21/19 1629)  magnesium sulfate IVPB 2 g 50 mL  (0 g Intravenous Stopped 07/21/19  1915)  morphine 4 MG/ML injection 4 mg (4 mg Intravenous Given 07/21/19 1838)  potassium chloride SA (KLOR-CON) CR tablet 40 mEq (40 mEq Oral Given 07/22/19 0029)  potassium chloride SA (KLOR-CON) CR tablet 40 mEq (40 mEq Oral Given 07/22/19 0814)    Mobility walks Low fall risk   Focused Assessments Cardiac Assessment Handoff:  Cardiac Rhythm: Sinus tachycardia No results found for: CKTOTAL, CKMB, CKMBINDEX, TROPONINI No results found for: DDIMER Does the Patient currently have chest pain? NO     R Recommendations: See Admitting Provider Note  Report given to:   Additional Notes:

## 2019-07-22 NOTE — Progress Notes (Addendum)
Progress Note    Amber Bautista  FXT:[id number] DOB: 07/01/1971  DOA: 07/21/2019 PCP: Shelda Pal, DO    Brief Narrative:     Medical records reviewed and are as summarized below:  Amber Bautista is an 48 y.o. 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 Marin Olp) who presented to [facility] ER on 10/12 with reports of passing out x2.    Assessment/Plan:   Principal Problem:   Syncope Active Problems:   Carcinoid tumor of intestine   Chronic systolic heart failure (HCC)   Dilated cardiomyopathy (HCC)   Hypokalemia   Hypertensive heart disease with heart failure (HCC)   Morbidly obese (HCC)   Syncope Post voiding LOC associated with prodrome sounds vasovagal.   -Replete K/mag -Cardiology consultation appreciated: EP to see  Chronic systolic CHF Nonischemic cardiomyopathy Carcinoid Heart Disease - PV, TV with dominant lesion Chest x-ray clear, appears euvolemic on exam dehydrated, BNP 69, negative troponin.  LVEF 45-50% in 05/2019 but may be overestimated given frequency of non-perfusing PVC's. ? If prior hx of chemotherapy is related to NICM.  Reportedly had a LHC 12/2018 in [place] with negative findings.  -cardiology consult appreciated  -Continue home metoprolol Entresto, spironolactone -? Reduction vs continuation of IVF  Hypokalemia/Hypomagnesemia Reports being adherent to her home potassium and magnesium supplements -aggressively replace K -Mg has been replaced    Diarrhea -have asked the nursing staff to clearly document frequency of stools -may need to speak with either Dr. Havery Moros or Dr. Elnoria Howard regarding treatments (apparanly a long-standing issue)  Hemorrhoids  Recently seen in the ER with purulent drainage from a hemorrhoid, thought  to be infected  -Continue home lidocaine -Continue sitz bath. -Continue Augmentin -Follow-up with GI as an outpatient  CKD III  Baseline ~1.3-1.5  Metastatic carcinoid Followed by Dr. Marin Olp, on Somatuline. Primary lesion thought to be pancreas. Evidently this has been metastatic for some time, but she has been stable on somatuline.  Recent PET CT 05/2019 however showed mets to the liver, lymph nodes in upper abdomen, cardiac involvement  Hx Stage IIb Ductal Carcinoma  S/p carboplatin / Taxotere back in 2013.  Morbid obesity Body mass index is 36.32 kg/m.  High risk for cardiac complications, needs aggressive replacement of her electrolytes.  Will be here > 2 midnight so will change to inpt.   Family Communication/Anticipated D/C date and plan/Code Status   DVT prophylaxis: Lovenox ordered. Code Status: Full Code.  Family Communication:  Disposition Plan: pending cards/EP consult   Medical Consultants:    Cards/EP    Subjective:   Did not sleep much last PM Diarrhea is normal for her  Objective:    Vitals:   07/22/19 0900 07/22/19 0930 07/22/19 1015 07/22/19 1100  BP: 109/66 (!) 159/101 (!) 146/89 (!) 143/93  Pulse: 88 93 87 80  Resp: (!) 23 17 17 17   Temp:      SpO2: 99% 96% 99% 96%  Weight:      Height:        Intake/Output Summary (Last 24 hours) at 07/22/2019 1112 Last data filed at 07/21/2019 1920 Gross per 24 hour  Intake 102.51 ml  Output -  Net 102.51 ml   Filed Weights   07/21/19 1318  Weight: 102.1 kg    Exam: In bed,  resting rrr No increased work of breathing No LE edema A+Ox3  Data Reviewed:   I have personally reviewed following labs and imaging studies:  Labs: Labs show the following:   Basic Metabolic Panel: Recent Labs  Lab 07/18/19 2006 07/21/19 1213 07/21/19 1618 07/21/19 2103 07/22/19 0250  NA 138 139  --  139 138  K 2.9* 2.8*  --  3.5 3.2*  CL 105 104  --  108 106  CO2 21* 24  --  20* 20*  GLUCOSE  128* 113*  --  121* 114*  BUN 16 12  --  13 13  CREATININE 1.50* 1.42*  --  1.28* 1.32*  CALCIUM 9.0 9.0  --  8.8* 8.7*  MG  --   --  1.5* 2.0  --    GFR Estimated Creatinine Clearance: 62.2 mL/min (A) (by C-G formula based on SCr of 1.32 mg/dL (H)). Liver Function Tests: Recent Labs  Lab 07/21/19 1334 07/22/19 0250  AST 21 24  ALT 17 17  ALKPHOS 77 78  BILITOT 1.2 1.1  PROT 7.5 6.4*  ALBUMIN 3.3* 3.0*   No results for input(s): LIPASE, AMYLASE in the last 168 hours. No results for input(s): AMMONIA in the last 168 hours. Coagulation profile Recent Labs  Lab 07/21/19 1334  INR 1.0    CBC: Recent Labs  Lab 07/18/19 2006 07/21/19 1213 07/22/19 0250  WBC 10.4 8.4 8.4  NEUTROABS 8.3*  --   --   HGB 14.6 15.3* 13.7  HCT 47.9* 49.6* 42.8  MCV 92.6 94.1 92.8  PLT 311 325 317   Cardiac Enzymes: No results for input(s): CKTOTAL, CKMB, CKMBINDEX, TROPONINI in the last 168 hours. BNP (last 3 results) Recent Labs    04/09/19 1458 05/20/19 1611  PROBNP 469* 1,071*   CBG: Recent Labs  Lab 07/22/19 0746  GLUCAP 96   D-Dimer: No results for input(s): DDIMER in the last 72 hours. Hgb A1c: No results for input(s): HGBA1C in the last 72 hours. Lipid Profile: No results for input(s): CHOL, HDL, LDLCALC, TRIG, CHOLHDL, LDLDIRECT in the last 72 hours. Thyroid function studies: No results for input(s): TSH, T4TOTAL, T3FREE, THYROIDAB in the last 72 hours.  Invalid input(s): FREET3 Anemia work up: No results for input(s): VITAMINB12, FOLATE, FERRITIN, TIBC, IRON, RETICCTPCT in the last 72 hours. Sepsis Labs: Recent Labs  Lab 07/18/19 2006 07/21/19 1213 07/21/19 1334 07/21/19 1618 07/22/19 0250  WBC 10.4 8.4  --   --  8.4  LATICACIDVEN  --   --  1.7 0.9  --     Microbiology Recent Results (from the past 240 hour(s))  SARS CORONAVIRUS 2 (TAT 6-24 HRS) Nasopharyngeal Nasopharyngeal Swab     Status: None   Collection Time: 07/21/19  1:40 PM   Specimen:  Nasopharyngeal Swab  Result Value Ref Range Status   SARS Coronavirus 2 NEGATIVE NEGATIVE Final    Comment: (NOTE) SARS-CoV-2 target nucleic acids are NOT DETECTED. The SARS-CoV-2 RNA is generally detectable in upper and lower respiratory specimens during the acute phase of infection. Negative results do not preclude SARS-CoV-2 infection, do not rule out co-infections with other pathogens, and should not be used as the sole basis for treatment or other patient management decisions. Negative results must be combined with clinical observations, patient history, and epidemiological information. The expected result is Negative. Fact Sheet for Patients: SugarRoll.be Fact Sheet for Healthcare Providers: https://www.woods-mathews.com/ This test is not yet approved or cleared by the [place] FDA and  has been  authorized for detection and/or diagnosis of SARS-CoV-2 by FDA under an Emergency Use Authorization (EUA). This EUA will remain  in effect (meaning this test can be used) for the duration of the COVID-19 declaration under Section 56 4(b)(1) of the Act, 21 U.S.C. section 360bbb-3(b)(1), unless the authorization is terminated or revoked sooner. Performed at [facility], [place] [address], [place], [place] [address]     Procedures and diagnostic studies:  Dg Chest Port 1 View  Result Date: 07/21/2019 CLINICAL DATA:  Syncope.  Breast cancer. EXAM: PORTABLE CHEST 1 VIEW COMPARISON:  August 23, 2012 FINDINGS: Stable cardiomegaly. The hila, mediastinum, lungs, and pleura are unremarkable. IMPRESSION: No active disease. Electronically Signed   By: Dorise Bullion III M.D   On: 07/21/2019 13:27    Medications:   . amoxicillin-clavulanate  1 tablet Oral Q12H  . enoxaparin (LOVENOX) injection  40 mg Subcutaneous Q24H  . magnesium chloride  1 tablet Oral BID  . metoprolol succinate  50 mg Oral Daily  . potassium chloride  40 mEq Oral  Once  . sacubitril-valsartan  1 tablet Oral BID  . sodium chloride flush  3 mL Intravenous Q12H  . spironolactone  25 mg Oral Daily   Continuous Infusions:   LOS: 0 days   Geradine Girt  [facility]   How to contact the [facility] Attending or Consulting provider [place] or covering provider during after hours [place], for this patient?  1. Check the care team in [facility] and look for a) attending/consulting [facility] provider listed and b) the [facility] team listed 2. Log into [email or web address] and use [place] universal password to access. If you do not have the password, please contact the hospital operator. 3. Locate the [facility] provider you are looking for under [facility] and page to a number that you can be directly reached. 4. If you still have difficulty reaching the provider, please page the [facility] (Director on Call) for the Hospitalists listed on amion for assistance.  07/22/2019, 11:12 AM

## 2019-07-22 NOTE — ED Notes (Signed)
Tele ordered bfast 

## 2019-07-22 NOTE — Consult Note (Addendum)
Cardiology Consultation:   Patient ID: Amber Bautista MRN: [id number]; DOB: 1971/08/12  Admit date: 07/21/2019 Date of Consult: 07/22/2019  Primary Care Provider: Shelda Pal, DO Primary Cardiologist: Shirlee More, MD  Primary Electrophysiologist:  None    Patient Profile:   Amber Bautista is a 48 y.o. female with a hx of NICM, chronic CHF (systolic), HTN, LBBB, metastatic carcinoid with liver involvement,  h/ostage IIb ductal carcinoma of left breast (s/p adjuvant chemotherapy with 4 cycles of carboplatinum/Taxotere co mpleted 8 years ago) who is being seen today for the evaluation of PVCs, CM at the request of Dr.Croitoru.  History of Present Illness:   Amber Bautista with above PMHx sees Dr. Bettina Gavia outpatient, he last saw her in Aug with worsening exertional capacity, and SOB, noted PVCs on monitor and planned for f/u echo with EP referral for her PVCs. He notes historically her EF was 25-30% mild LA enlargement, mild MR, severe LV dilation, TTE done Aug 2020 noted now with EF 45-50%, also is found to have septal hypokinesis consistent w/ her known LBBB, mod-severe TR  She follows with Dr.Ennever with Oncology. Complicated by chronic diarrhea and resulting hemorrhoids which is infected.  - receiving lanreotide (Somatuline) 120mg  IM monthly 05/14/2019: PET scan IMPRESSION: 1. Intense radiotracer activity associated with multi-system metastasis consistent with metastatic well-differentiated neuroendocrine tumor. 2. Primary lesion may well be in the head of the pancreas witha 2 cm lesion. 3. Extensive neuroendocrine tumor hepatic metastasis with large lesion in the LEFT hepatic lobe and multiple smaller lesions the RIGHT hepatic lobe. 4. Nodal metastasis within the mediastinum and periaortic upper abdomen. 5. Peritoneal nodal metastasis with small lesions in the ventral peritoneal space and larger peritoneal implants in the deep pelvis. 6. No evidence skeletal metastasis.    She was admitted to [facility] 2/2 syncope.  In d/w Dr. Loletha Grayer, this sounded very much of vagal episode occurring with lightheadedness after having a BM followed by syncope finding herself on the floor.  She called her family for help and when they were with her she apparently had another fainting spell.  Cardiology was called given mildly abn HS Trop, and observed frequent PVCs on monitor.   She was noted mildly hypokalemic and replacedEP is asked to weigh in on PVCs, CM, and any recommendations.  LABS K+ 2.8 >> 3.5 >> 3.2 Mag 1.5 >> 2.0 BUN/Creat 12/1.42  > 13/1.32 WBC 8.4 H/H  15/49 Plts 325 HS trop 24, 25,  BNP 69.8 Preg negative  Heart Pathway Score:     Past Medical History:  Diagnosis Date  . Abdominal pain 12/17/2014  . Breast cancer ([place]) 07/12/12   Stage IIB Ductal Carcinoma to  Upper Outer Quadrant: Left Breast: Triple Negative  . Cancer ([place]) 03/03/2015   Overview:  in intestine-2007, in liver-2007, and left breast-2013  . Cancer of upper-outer quadrant of female breast ([place]) 08/01/2012   Image guided biopsy Oct., 2013 -    2 cm by MRI, solitary;     IDC; TNBC; positive LVI;      . Carcinoid tumor of intestine 2001   Mets 2007  . Carcinoma of breast treated with adjuvant chemotherapy ([place])    4 cycles of Carboplatin / Taxol  . Chronic congestive heart failure ([place]) 03/17/2019  . Congestive heart failure (CHF) ([place])   . Diarrhea    Sandostatin  . Frequent PVCs 04/01/2019  . Goals of care, counseling/discussion 03/31/2019  . Hypertension   . Metastatic carcinoid tumor ([place])   .  Morbidly obese ([place])   . S/P radiation therapy 02/04/2013-02/21/2013   Left Breast and Axilla / 46.8 Gy in 26 fractions were planned (she only received 18 Gy in 10 fractions)    Past Surgical History:  Procedure Laterality Date  . BREAST SURGERY  08/23/12   Lt br lumpectomy  . [place] 2007  . COLON SURGERY  2001   Cancer/ Intestinal Resection  . OVARIAN CYST REMOVAL  2001  . PARTIAL  MASTECTOMY WITH NEEDLE LOCALIZATION AND AXILLARY SENTINEL LYMPH NODE BX  08/23/2012   Procedure: PARTIAL MASTECTOMY WITH NEEDLE LOCALIZATION AND AXILLARY SENTINEL LYMPH NODE BX;  Surgeon: Adin Hector, MD;  Location: [place];  Service: General;  Laterality: Left;  blue dye injection   . PORTACATH PLACEMENT  08/23/2012   Procedure: INSERTION PORT-A-CATH;  Surgeon: Adin Hector, MD;  Location: [place];  Service: General;  Laterality: Right;  intraop ultrasound     Home Medications:  Prior to Admission medications   Medication Sig Start Date End Date Taking? Authorizing Provider  acetaminophen (TYLENOL) 500 MG tablet Take 500 mg by mouth every 6 (six) hours as needed for moderate pain.   Yes [provider]  amoxicillin-clavulanate (AUGMENTIN) 875-125 MG tablet Take 1 tablet by mouth every 12 (twelve) hours. 07/18/19  Yes Petrucelli, Samantha R, PA-C  furosemide (LASIX) 20 MG tablet Take 1 tablet (20 mg total) by mouth daily. 04/02/19 07/21/19 Yes Richardo Priest, MD  lidocaine (XYLOCAINE) 2 % jelly Apply to rectal area every 4 hours as needed for pain 07/18/19  Yes Petrucelli, Samantha R, PA-C  magnesium chloride (SLOW-MAG) 64 MG TBEC SR tablet Take 1 tablet (64 mg total) by mouth 2 (two) times daily. 06/20/19  Yes Loel Dubonnet, NP  metoprolol succinate (TOPROL-XL) 50 MG 24 hr tablet Take 1 tablet (50 mg total) by mouth daily. Take with or immediately following a meal. 06/25/19 09/23/19 Yes Wendling, Crosby Oyster, DO  potassium chloride SA (K-DUR) 20 MEQ tablet Take 1 tablet (20 mEq total) by mouth 2 (two) times daily. 04/02/19  Yes Richardo Priest, MD  sacubitril-valsartan (ENTRESTO) 49-51 MG Take 1 tablet by mouth 2 (two) times daily. 04/02/19  Yes Richardo Priest, MD  spironolactone (ALDACTONE) 25 MG tablet Take 1 tablet (25 mg total) by mouth daily. 04/02/19 07/21/19 Yes Richardo Priest, MD  AMBULATORY NON FORMULARY MEDICATION Nitroglycerin 0.125% gel. Apply a pea size amount to the  rectum every 8 hours Patient not taking: Reported on 07/21/2019 07/16/19   Yetta Flock, MD  potassium chloride SA (KLOR-CON) 20 MEQ tablet Take 1 tablet (20 mEq total) by mouth daily. Patient not taking: Reported on 07/21/2019 07/18/19   Petrucelli, Glynda Jaeger, PA-C    Inpatient Medications: Scheduled Meds: . amoxicillin-clavulanate  1 tablet Oral Q12H  . enoxaparin (LOVENOX) injection  40 mg Subcutaneous Q24H  . magnesium chloride  1 tablet Oral BID  . metoprolol succinate  50 mg Oral Daily  . potassium chloride  40 mEq Oral Once  . sacubitril-valsartan  1 tablet Oral BID  . sodium chloride flush  3 mL Intravenous Q12H  . spironolactone  25 mg Oral Daily   Continuous Infusions:  PRN Meds: acetaminophen **OR** acetaminophen, HYDROcodone-acetaminophen, lidocaine, morphine injection  Allergies:    Allergies  Allergen Reactions  . Sulfamethoxazole-Trimethoprim Swelling and Hypertension    Swelling and hypertension after taking, was admitted to hospital  . Adhesive [Tape] Rash and Other (See Comments)    Causes SEVERE  RASHES AND BLISTERS    Social History:   Social History   Socioeconomic History  . Marital status: Divorced    Spouse name: Not on file  . Number of children: Not on file  . Years of education: Not on file  . Highest education level: Not on file  Occupational History  . Not on file  Social Needs  . Financial resource strain: Not on file  . Food insecurity    Worry: Not on file    Inability: Not on file  . Transportation needs    Medical: Not on file    Non-medical: Not on file  Tobacco Use  . Smoking status: Never Smoker  . Smokeless tobacco: Never Used  . Tobacco comment: NEVER SMOKED  Substance and Sexual Activity  . Alcohol use: No    Alcohol/week: 0.0 standard drinks  . Drug use: No  . Sexual activity: Not Currently    Comment: menarche age 62, P 3, G 1, 1 misscarriage  Lifestyle  . Physical activity    Days per week: Not on file     Minutes per session: Not on file  . Stress: Not on file  Relationships  . Social Herbalist on phone: Not on file    Gets together: Not on file    Attends religious service: Not on file    Active member of club or organization: Not on file    Attends meetings of clubs or organizations: Not on file    Relationship status: Not on file  . Intimate partner violence    Fear of current or ex partner: Not on file    Emotionally abused: Not on file    Physically abused: Not on file    Forced sexual activity: Not on file  Other Topics Concern  . Not on file  Social History Narrative  . Not on file    Family History:   Family History  Problem Relation Age of Onset  . Heart attack Father   . Diabetes Father   . Hypertension Mother   . Diabetes Mother   . Glaucoma Mother   . Hypertension Brother   . Asthma Brother   . Cancer Paternal Grandmother   . Colon cancer Neg Hx   . Esophageal cancer Neg Hx   . Rectal cancer Neg Hx   . Stomach cancer Neg Hx      ROS:  Please see the history of present illness.  All other ROS reviewed and negative.     Physical Exam/Data:   Vitals:   07/22/19 1100 07/22/19 1145 07/22/19 1230 07/22/19 1322  BP: (!) 143/93 (!) 155/98 (!) 142/104 127/84  Pulse: 80 82 88 (!) 109  Resp: 17 17 14 20   Temp:    97.7 F (36.5 C)  TempSrc:    Oral  SpO2: 96% 97% 98% 98%  Weight:      Height:        Intake/Output Summary (Last 24 hours) at 07/22/2019 1335 Last data filed at 07/22/2019 1248 Gross per 24 hour  Intake 1592.51 ml  Output -  Net 1592.51 ml   Last 3 Weights 07/21/2019 07/18/2019 06/19/2019  Weight (lbs) 225 lb 226 lb 231 lb 1.9 oz  Weight (kg) 102.059 kg 102.513 kg 104.835 kg     Body mass index is 36.32 kg/m.  General:  Well nourished, well developed, in no acute distress HEENT: normal Lymph: no adenopathy Neck: no JVD Endocrine:  No thryomegaly Vascular: No carotid  bruits Cardiac:  RRR; 1/6 SM, no gallops or rubs Lungs:   CTA b/l, no wheezing, rhonchi or rales  Abd: soft, nontender, obese Ext: no edema Musculoskeletal:  No deformities Skin: warm and dry  Neuro:   No gross focal abnormalities noted Psych:  Normal affect   EKG:  The EKG was personally reviewed and demonstrates:    SR 85bpm, QRS 128ms ST 100bpm, LBBB,  Wider QRS beats in a bigeminal/alternating pattern, though in sinus  06/19/19 SR V bigeminy, narrow sinus QRS 05/07/2019 ST 110, LBBB, again in an alternating pattern  Telemetry:  Telemetry was personally reviewed and demonstrates:   SR, ST 80's-110's, faster rates associated with BBB, PVCs  Relevant CV Studies:  Echo 05/23/19 1. Mild hypokinesis of the left ventricular, entire septal wall. 2. The left ventricle has mildly reduced systolic function, with an ejection fraction of 45-50%. The cavity size was normal. There is moderately increased left ventricular wall thickness. Left ventricular diastolic Doppler parameters are consistent with impaired relaxation. 3. The right ventricle has normal systolic function. The cavity was normal. There is no increase in right ventricular wall thickness. 4. Right atrial size was mildly dilated. 5. Tricuspid valve regurgitation is moderate-severe. 6. Mild pulmonic stenosis. 7. The aorta is normal unless otherwise noted. 8. The inferior vena cava was normal in size with <50% respiratory variability. 9. TDS, cinsider MRI for better assesment of RV and TR   05/02/19 Zio monitor A ZIO monitor was used for 7 days beginning 04/07/2019 to assess ventricular ectopy in the setting of dilated cardiomyopathy.  The rhythm was sinus throughout the recording with minimum average and maximum heart rates of 58, 93 and 131 bpm.  There were no pauses of 3 seconds or greater.  There were no triggered or diary events.  Ventricular ectopy was frequent 7.4% incidence of isolated PVCs less than 1% incidence of couplets and triplets.  The longest episode of  bigeminy 23 minutes 28 seconds in the longest episode of trigeminy 1 minute 26 seconds.  Conclusion frequent ventricular ectopy   Dr. Bettina Gavia noted report of prior w/u Echo 08/2012 LVEF 25-30%, mild LA enlargement, mild MR, severe LV dilation. Seen by cardiology at [facility] underwent heart cath showing "very weak heart muscle and normal coronary arteries".      Laboratory Data:  High Sensitivity Troponin:   Recent Labs  Lab 07/21/19 1334 07/21/19 1618  TROPONINIHS 24* 25*     Chemistry Recent Labs  Lab 07/21/19 1213 07/21/19 2103 07/22/19 0250  NA 139 139 138  K 2.8* 3.5 3.2*  CL 104 108 106  CO2 24 20* 20*  GLUCOSE 113* 121* 114*  BUN 12 13 13   CREATININE 1.42* 1.28* 1.32*  CALCIUM 9.0 8.8* 8.7*  GFRNONAA 43* 49* 47*  GFRAA 50* 57* 55*  ANIONGAP 11 11 12     Recent Labs  Lab 07/21/19 1334 07/22/19 0250  PROT 7.5 6.4*  ALBUMIN 3.3* 3.0*  AST 21 24  ALT 17 17  ALKPHOS 77 78  BILITOT 1.2 1.1   Hematology Recent Labs  Lab 07/18/19 2006 07/21/19 1213 07/22/19 0250  WBC 10.4 8.4 8.4  RBC 5.17* 5.27* 4.61  HGB 14.6 15.3* 13.7  HCT 47.9* 49.6* 42.8  MCV 92.6 94.1 92.8  MCH 28.2 29.0 29.7  MCHC 30.5 30.8 32.0  RDW 17.6* 17.2* 17.2*  PLT 311 325 317   BNP Recent Labs  Lab 07/21/19 1334  BNP 69.8    DDimer No results for input(s): DDIMER  in the last 168 hours.   Radiology/Studies:   Ct Pelvis W Contrast Result Date: 07/18/2019 CLINICAL DATA:  History of anal fissure and hemorrhoids. Rectal pain. EXAM: CT PELVIS WITH CONTRAST TECHNIQUE: Multidetector CT imaging of the pelvis was performed using the standard protocol following the bolus administration of intravenous contrast. CONTRAST:  75 mL OMNIPAQUE IOHEXOL 300 MG/ML  SOLN COMPARISON:  CT abdomen and pelvis 04/21/2019. FINDINGS: Urinary Tract:  No abnormality visualized. Bowel: Unremarkable visualized pelvic bowel loops. Nodule along the surface of the distal sigmoid colon seen on  the prior CT scan is difficult to visualize on this examination. Vascular/Lymphatic: No pathologically enlarged lymph nodes. No significant vascular abnormality seen. Reproductive:  No mass or other significant abnormality Other: Perianal and perirectal soft tissues appear somewhat thickened but unchanged. No rim enhancing fluid collection is identified. There is no notable stranding in subcutaneous fat. Musculoskeletal: No acute or focal abnormality. IMPRESSION: Perianal and perirectal soft tissues appear mildly thickened but not grossly different than on the prior CT. No abscess is identified. Electronically Signed   By: Inge Rise M.D.   On: 07/18/2019 21:24     Dg Chest Port 1 View Result Date: 07/21/2019 CLINICAL DATA:  Syncope.  Breast cancer. EXAM: PORTABLE CHEST 1 VIEW COMPARISON:  August 23, 2012 FINDINGS: Stable cardiomegaly. The hila, mediastinum, lungs, and pleura are unremarkable. IMPRESSION: No active disease. Electronically Signed   By: Dorise Bullion III M.D   On: 07/21/2019 13:27    Assessment and Plan:   1. Syncope     Sound of vagal etiology      H/o prior syncope under same situation Both  S/p BM (diarrhea, is her baseline), had warning with change in vision, beacme lightheaded and then faints. This event she recognized the warning and sat back down on the toilet though fainted anyway Both time she woke feeling very weak, perhaps with an awareness of her heart beating, scared.  This event when she sat up after the initial event she again started with dimmed vision and fainted again, remained on the floor and quite weak until EMS arrived She does not recall EMS vitals (they are not in Epic)  She is on a good HF regime including lasix She was found hypokalemic and with low mag as well  She has chronic diarrhea with her cancer treatment Currently being treated for infected hemorrhoids an in much pain may have hadded to vagal event this time around.  I will defer  her meds to primary cardiology team    2. NICM 3. PVCs     She does not appear volume OL currently  LVEF is improved from 2013 by Dr. Joya Gaskins note in [place] was 25-30% 7.4% PVC burden on her 14 day monitor LBBB appears rate related Continue BB to BP Tolerance (I do not see h/o bradycardia) Given LVEF is better, not sure we need to visit PVC ablation at this juncture.  I will review her record, tele and EKGs with Dr. Rayann Heman          For questions or updates, please contact [place] Please consult [email or web address] for contact info under     Signed, Baldwin Jamaica, PA-C  07/22/2019 1:35 PM   I have seen, examined the patient, and reviewed the above assessment and plan.  Changes to above are made where necessary.  On exam, RRR.  She has longstanding PVC's, EF 45%, asymptomatic. Now admitted with syncope in the setting of diarrhea and vagal event. I do  not feel this is a cardiac etiology. Would defer further management of PVC's to outpatient setting. Followup with Dr Curt Bears has been arranged.  Electrophysiology team to see as needed while here. Please call with questions.    Co Sign: Thompson Grayer, MD 07/22/2019 5:25 PM

## 2019-07-22 NOTE — ED Notes (Signed)
Pt c/o itching intermittently at back \, linear redness noted secondary to scratching.

## 2019-07-22 NOTE — Telephone Encounter (Signed)
Sorry to hear that. If she needs any inpatient help from our service during her stay we are available. She can otherwise follow up with [place] after her discharge. thanks

## 2019-07-22 NOTE — Telephone Encounter (Signed)
Called patient to give her Dr. Abigail Butts recommendations and she is in the ED waiting to be admitted. Went in to ED because she had 2 syncope episodes yesterday

## 2019-07-22 NOTE — Progress Notes (Addendum)
Progress Note  Patient Name: Amber Bautista Date of Encounter: 07/22/2019  Primary Cardiologist: Shirlee More, MD   Subjective   O/N events: N/A. Developed heat rash.  Amber Bautista was examined and evaluated at bedside this am. She was observed walking back from the bathroom w/o difficulty. She mentions that she has had no further near-syncope, light-headedness or palpitations since admission. She does mentions some discomfort with rashes on her arms and back.   Inpatient Medications    Scheduled Meds: . amoxicillin-clavulanate  1 tablet Oral Q12H  . enoxaparin (LOVENOX) injection  40 mg Subcutaneous Q24H  . magnesium chloride  1 tablet Oral BID  . metoprolol succinate  50 mg Oral Daily  . potassium chloride  40 mEq Oral Once  . sacubitril-valsartan  1 tablet Oral BID  . sodium chloride flush  3 mL Intravenous Q12H  . spironolactone  25 mg Oral Daily   Continuous Infusions:  PRN Meds: acetaminophen **OR** acetaminophen, HYDROcodone-acetaminophen, lidocaine, morphine injection   Vital Signs    Vitals:   07/22/19 0715 07/22/19 0730 07/22/19 0745 07/22/19 0800  BP: 117/78 113/66 121/83 (!) 135/110  Pulse: 90 85 81 98  Resp: 20 17 15 16   Temp:      SpO2: 92% 96% 97% 98%  Weight:      Height:        Intake/Output Summary (Last 24 hours) at 07/22/2019 0841 Last data filed at 07/21/2019 1920 Gross per 24 hour  Intake 102.51 ml  Output -  Net 102.51 ml   Filed Weights   07/21/19 1318  Weight: 102.1 kg    Telemetry    Continued frequent RV outflow PVCs  - Personally Reviewed  ECG    No new EKG this am. Prior EKG 07/21/19 with LBBB - Personally Reviewed  Physical Exam   Gen: Well-developed, obese, NAD HEENT: NCAT head, hearing intact, EOMI, PERRL Neck: supple, ROM intact, no JVD CV: RRR, S1, S2 normal Pulm: CTAB, No rales, no wheezes Abd: Soft, BS+, NTND, No rebound, no guarding Extm: ROM intact, Peripheral pulses intact, No peripheral edema Skin: Dry,  Warm, normal turgor  Labs    Chemistry Recent Labs  Lab 07/21/19 1213 07/21/19 1334 07/21/19 2103 07/22/19 0250  NA 139  --  139 138  K 2.8*  --  3.5 3.2*  CL 104  --  108 106  CO2 24  --  20* 20*  GLUCOSE 113*  --  121* 114*  BUN 12  --  13 13  CREATININE 1.42*  --  1.28* 1.32*  CALCIUM 9.0  --  8.8* 8.7*  PROT  --  7.5  --  6.4*  ALBUMIN  --  3.3*  --  3.0*  AST  --  21  --  24  ALT  --  17  --  17  ALKPHOS  --  77  --  78  BILITOT  --  1.2  --  1.1  GFRNONAA 43*  --  49* 47*  GFRAA 50*  --  57* 55*  ANIONGAP 11  --  11 12     Hematology Recent Labs  Lab 07/18/19 2006 07/21/19 1213 07/22/19 0250  WBC 10.4 8.4 8.4  RBC 5.17* 5.27* 4.61  HGB 14.6 15.3* 13.7  HCT 47.9* 49.6* 42.8  MCV 92.6 94.1 92.8  MCH 28.2 29.0 29.7  MCHC 30.5 30.8 32.0  RDW 17.6* 17.2* 17.2*  PLT 311 325 317    Cardiac EnzymesNo results for input(s): TROPONINI in  the last 168 hours. No results for input(s): TROPIPOC in the last 168 hours.   BNP Recent Labs  Lab 07/21/19 1334  BNP 69.8     DDimer No results for input(s): DDIMER in the last 168 hours.   Radiology    Dg Chest Port 1 View  Result Date: 07/21/2019 CLINICAL DATA:  Syncope.  Breast cancer. EXAM: PORTABLE CHEST 1 VIEW COMPARISON:  August 23, 2012 FINDINGS: Stable cardiomegaly. The hila, mediastinum, lungs, and pleura are unremarkable. IMPRESSION: No active disease. Electronically Signed   By: Dorise Bullion III M.D   On: 07/21/2019 13:27    Cardiac Studies   N/A  Patient Profile     48 y.o. female  w/ PMH of HFrEF, non-ischemic cardiomyopathy, obesity, HTN, LBBB, metastatic carcinoid w/ liver mets, Stage 2b ductal carcinoma s/p chemo & rad admit for syncope.  Assessment & Plan    Syncope Frequent PVCs LBBB Hypokalemia Presenting w/ recurrent syncope. Per chart review, known high PVC burden with outpatient plan to f/u with EP. Telemetry overnight continues to show high PVC burden. Mag 2.0 K 3.2 - Replete K  & Mag - C/w telemetry - EP to see today - Consider gentle fluid resuscitation to keep up with fluid loss from diarrhea - C/w metoprolol  Chronic systolic heart failure Non-ischemic cardiomyopathy Hx of HFrEF with EF of 30% in 2013. Improved to 45-50% with medical management. BNP 70. Appear euvolemic this admission. Per patient, had cath done in 12/2018 with negative findings. - C/w telemetry - I&Os, Daily weights - C/w home meds: metoprolol 50mg  daily, Entresto 49-51 BID, spironolactone 25mg  daily  Carcinoid Tumor w/ Syndrome Follows w/ Dr.Ennever with Oncology. Complicated by chronic diarrhea and resulting hemorrhoids which is infected. Possible pancreas primary per recent PET scan. - Not on any cardiotoxic meds currently - receiving lanreotide (Somatuline) 120mg  IM monthly - F/u with oncology outpatient  Hx of Stage 2b Breast Ca Diagnosed ?8 years prior. Unclear chemo regimen at the time. Currently in remission. Possible etiology of cardiomyopathy. - F/u with oncology    For questions or updates, please contact [place] Please consult [email or web address] for contact info under Cardiology/STEMI.     Signed, Gilberto Better, MD PGY-2, [place] [facility] Pager: [phone] 07/22/2019, 8:41 AM    Attending attestation to follow  I have seen and examined the patient along with Gilberto Better, MD.  I have reviewed the chart, notes and new data.  I agree with his note.  Key new complaints: no CV complaints, has lots of hemorrhoidal pain. Denies dyspnea. Key examination changes: frequent ectopy. No overt hypervolemia (exam limited by obesity) Key new findings / data: currently off telemetry (transferred from ED bed). Ecg shows NSR with narrow (er) QRS complex (116 ms) at slower heart rates (80s) and full LBBB (150 ms) at rates in the 90-100s, very frequent RVOT morphology PVCs  PLAN: KCl supplementation and aggressive diarrhea treatment. Telemetry. EP consultation to follow.  Sanda Klein, MD, [place] [phone] 07/22/2019, 1:18 PM

## 2019-07-23 LAB — BASIC METABOLIC PANEL
Anion gap: 9 (ref 5–15)
BUN: 15 mg/dL (ref 6–20)
CO2: 21 mmol/L — ABNORMAL LOW (ref 22–32)
Calcium: 8.7 mg/dL — ABNORMAL LOW (ref 8.9–10.3)
Chloride: 107 mmol/L (ref 98–111)
Creatinine, Ser: 1.27 mg/dL — ABNORMAL HIGH (ref 0.44–1.00)
GFR calc Af Amer: 57 mL/min — ABNORMAL LOW (ref >60)
GFR calc non Af Amer: 50 mL/min — ABNORMAL LOW (ref >60)
Glucose, Bld: 103 mg/dL — ABNORMAL HIGH (ref 70–99)
Potassium: 5 mmol/L (ref 3.5–5.1)
Sodium: 137 mmol/L (ref 135–145)

## 2019-07-23 LAB — CBC
HCT: 45.7 % (ref 36.0–46.0)
Hemoglobin: 14.1 g/dL (ref 12.0–15.0)
MCH: 29.3 pg (ref 26.0–34.0)
MCHC: 30.9 g/dL (ref 30.0–36.0)
MCV: 94.8 fL (ref 80.0–100.0)
Platelets: 322 10*3/uL (ref 150–400)
RBC: 4.82 MIL/uL (ref 3.87–5.11)
RDW: 17.4 % — ABNORMAL HIGH (ref 11.5–15.5)
WBC: 8.1 10*3/uL (ref 4.0–10.5)
nRBC: 0 % (ref 0.0–0.2)

## 2019-07-23 LAB — MAGNESIUM: Magnesium: 1.9 mg/dL (ref 1.7–2.4)

## 2019-07-23 LAB — GLUCOSE, CAPILLARY: Glucose-Capillary: 100 mg/dL — ABNORMAL HIGH (ref 70–99)

## 2019-07-23 NOTE — Consult Note (Signed)
   [facility] CM Inpatient Consult   07/23/2019  Lynn 04-29-1971 [id number]  Patient screened for [facility] Medicare risk score for  [place] Management services are needed.  Review of patient's medical record reveals patient is for sycope and collapse  Primary Care Provider is Nani Ravens, Crosby Oyster, DO, [place], [facility], this office provides the Transition of Care follow up.   Plan: Post General EMMI calls for follow up.   Please place a [facility] Care Management consult as appropriate and for questions contact:   Natividad Brood, RN BSN [facility] Liaison  [phone] business mobile phone Toll free office [phone]  Fax number: [phone] [email or web address]      patient is a 48 year old female with history of chronic systolic heart failure, LBBB, CKD stage III, stage IIb ductal carcinoma s/p adjuvant chemotherapy, metastatic carcinoid s/p bowel resection with mets to the liver, chronic diarrhea who presented to the hospital for evaluation of syncope.

## 2019-07-23 NOTE — Progress Notes (Signed)
Patient had another episode of lightheadedness and fast HR.   Per patient - she was coming from the restroom when it happened.     Paged MD-  Will continue to monitor outpatient.    Provided education- ask patient to monitor her BP once daily and PRN,  Transition from sitting to standing slowly (monitor for dizziness) recommend checking HR post episode.  Recommend avoiding straining

## 2019-07-23 NOTE — Discharge Summary (Addendum)
PATIENT DETAILS Name: Amber Bautista Age: 48 y.o. Sex: female Date of Birth: 1971/06/03 MRN: [id number]. Admitting Physician: Edwin Dada, MD NOT:[email or web address], Crosby Oyster, DO  Admit Date: 07/21/2019 Discharge date: 07/23/2019  Recommendations for Outpatient Follow-up:  1. Follow up with PCP in 1-2 weeks 2. Please obtain BMP/CBC in one week 3. Please ensure follow-up with oncology  Admitted From:  Home  Disposition: [place]: No  Equipment/Devices: None  Discharge Condition: StablE  CODE STATUS: FULL CODE  Diet recommendation:  Diet Order            Diet - low sodium heart healthy        Diet 2 gram sodium Room service appropriate? Yes; Fluid consistency: Thin  Diet effective now               Brief Summary: See H&P, Labs, Consult and Test reports for all details in brief, patient is a 48 year old female with history of chronic systolic heart failure, LBBB, CKD stage III, stage IIb ductal carcinoma s/p adjuvant chemotherapy, metastatic carcinoid s/p bowel resection with mets to the liver, chronic diarrhea who presented to the hospital for evaluation of syncope.  Patient was found to have a high burden of PVCs during this hospital stay-was evaluated by general cardiology and electrophysiology-currently remained stable-Per cardiology no further work-up planned-okay to discharge.  Brief Hospital Course: Syncope: Thought to be vasovagal in nature-in the setting of chronic diarrhea.  Extensive work-up already completed in the outpatient setting-seen by both electrophysiology and general cardiology-okay to discharge. Patient aware of driving restrictions (see below)-she apparently does not drive anymore.  Hypokalemia/hypomagnesemia: Secondary to diarrhea that is chronic-this is been repleted-continue to monitor closely in the outpatient setting  Diarrhea: This is chronic-likely secondary to carcinoid-continue outpatient management.  Chronic  systolic heart failure: Remains euvolemic-resume usual regimen on discharge.  Metastatic carcinoid: Resume outpatient follow-up with Dr. Marin Olp  History of stage IIb ductal carcinoma: Resume outpatient follow-up/monitoring with Dr. Marin Olp.  History of infected hemorrhoid: Per patient-she was started on Augmentin this past Saturday-she is to finish 2-week course.  Will defer further to PCP-continue Augmentin on discharge.  Obesity: Estimated body mass index is 37.24 kg/m as calculated from the following:   Height as of this encounter: 5\' 6"  (1.676 m).   Weight as of this encounter: 104.6 kg.    Procedures/Studies: None  Discharge Diagnoses:  Principal Problem:   Syncope Active Problems:   Carcinoid tumor of intestine   Chronic systolic heart failure (HCC)   Dilated cardiomyopathy (HCC)   Hypokalemia   Hypertensive heart disease with heart failure (HCC)   Morbidly obese (HCC)   Discharge Instructions:  Activity:  As tolerated   Discharge Instructions    (HEART FAILURE PATIENTS) Call MD:  Anytime you have any of the following symptoms: 1) 3 pound weight gain in 24 hours or 5 pounds in 1 week 2) shortness of breath, with or without a dry hacking cough 3) swelling in the hands, feet or stomach 4) if you have to sleep on extra pillows at night in order to breathe.   Complete by: As directed    Call MD for:  difficulty breathing, headache or visual disturbances   Complete by: As directed    Call MD for:  persistant nausea and vomiting   Complete by: As directed    Diet - low sodium heart healthy   Complete by: As directed    Discharge instructions   Complete by: As  directed    Follow with Primary MD  Shelda Pal, DO in 1-2 weeks  Please get a complete blood count and chemistry panel checked by your Primary MD at your next visit, and again as instructed by your Primary MD.  Get Medicines reviewed and adjusted: Please take all your medications with you for your  next visit with your Primary MD  Laboratory/radiological data: Please request your Primary MD to go over all hospital tests and procedure/radiological results at the follow up, please ask your Primary MD to get all Hospital records sent to his/her office.  In some cases, they will be blood work, cultures and biopsy results pending at the time of your discharge. Please request that your primary care M.D. follows up on these results.  Also Note the following: If you experience worsening of your admission symptoms, develop shortness of breath, life threatening emergency, suicidal or homicidal thoughts you must seek medical attention immediately by calling 911 or calling your MD immediately  if symptoms less severe.  You must read complete instructions/literature along with all the possible adverse reactions/side effects for all the Medicines you take and that have been prescribed to you. Take any new Medicines after you have completely understood and accpet all the possible adverse reactions/side effects.   Do not drive when taking Pain medications or sleeping medications (Benzodaizepines)  Do not take more than prescribed Pain, Sleep and Anxiety Medications. It is not advisable to combine anxiety,sleep and pain medications without talking with your primary care practitioner  Special Instructions: If you have smoked or chewed Tobacco  in the last 2 yrs please stop smoking, stop any regular Alcohol  and or any Recreational drug use.  Wear Seat belts while driving.  Please note: You were cared for by a hospitalist during your hospital stay. Once you are discharged, your primary care physician will handle any further medical issues. Please note that NO REFILLS for any discharge medications will be authorized once you are discharged, as it is imperative that you return to your primary care physician (or establish a relationship with a primary care physician if you do not have one) for your post  hospital discharge needs so that they can reassess your need for medications and monitor your lab values.   Discharge instructions   Complete by: As directed     Do not drive until cleared by your outpatient MD's. Use caution when using heavy equipment or power tools. Avoid working on ladders or at heights. Take showers instead of baths. Ensure the water temperature is not too high on the home water heater. Do not go swimming alone. When caring for infants or small children, sit down when holding, feeding, or changing them to minimize risk of injury to the child in the event you have a syncope   Increase activity slowly   Complete by: As directed      Allergies as of 07/23/2019      Reactions   Sulfamethoxazole-trimethoprim Swelling, Hypertension   Swelling and hypertension after taking, was admitted to hospital   Adhesive [tape] Rash, Other (See Comments)   Causes SEVERE RASHES AND BLISTERS      Medication List    STOP taking these medications   AMBULATORY NON FORMULARY MEDICATION     TAKE these medications   acetaminophen 500 MG tablet Commonly known as: TYLENOL Take 500 mg by mouth every 6 (six) hours as needed for moderate pain.   amoxicillin-clavulanate 875-125 MG tablet Commonly known as: AUGMENTIN Take  1 tablet by mouth every 12 (twelve) hours.   Entresto 49-51 MG Generic drug: sacubitril-valsartan Take 1 tablet by mouth 2 (two) times daily.   furosemide 20 MG tablet Commonly known as: LASIX Take 1 tablet (20 mg total) by mouth daily.   lidocaine 2 % jelly Commonly known as: XYLOCAINE Apply to rectal area every 4 hours as needed for pain   metoprolol succinate 50 MG 24 hr tablet Commonly known as: TOPROL-XL Take 1 tablet (50 mg total) by mouth daily. Take with or immediately following a meal.   potassium chloride SA 20 MEQ tablet Commonly known as: KLOR-CON Take 1 tablet (20 mEq total) by mouth 2 (two) times daily. What changed: Another medication with the  same name was removed. Continue taking this medication, and follow the directions you see here.   Slow-Mag 64 MG Tbec SR tablet Generic drug: magnesium chloride Take 1 tablet (64 mg total) by mouth 2 (two) times daily.   spironolactone 25 MG tablet Commonly known as: ALDACTONE Take 1 tablet (25 mg total) by mouth daily.      Follow-up Information    Constance Haw, MD Follow up.   Specialty: Cardiology Why: 08/04/2019 @ 3:30PM Contact information: [place] [address] [place], [place], DO. Schedule an appointment as soon as possible for a visit in 1 week(s).   Specialty: Family Medicine Contact information: [place] [address] [phone]        Richardo Priest, MD .   Specialty: Cardiology Contact information: [place] [address] [place] [address] [phone]          Allergies  Allergen Reactions   Sulfamethoxazole-Trimethoprim Swelling and Hypertension    Swelling and hypertension after taking, was admitted to hospital   Adhesive [Tape] Rash and Other (See Comments)    Causes SEVERE RASHES AND BLISTERS    Consultations:   cardiology  EP   Other Procedures/Studies: Ct Pelvis W Contrast  Result Date: 07/18/2019 CLINICAL DATA:  History of anal fissure and hemorrhoids. Rectal pain. EXAM: CT PELVIS WITH CONTRAST TECHNIQUE: Multidetector CT imaging of the pelvis was performed using the standard protocol following the bolus administration of intravenous contrast. CONTRAST:  75 mL OMNIPAQUE IOHEXOL 300 MG/ML  SOLN COMPARISON:  CT abdomen and pelvis 04/21/2019. FINDINGS: Urinary Tract:  No abnormality visualized. Bowel: Unremarkable visualized pelvic bowel loops. Nodule along the surface of the distal sigmoid colon seen on the prior CT scan is difficult to visualize on this examination. Vascular/Lymphatic: No pathologically enlarged lymph nodes.  No significant vascular abnormality seen. Reproductive:  No mass or other significant abnormality Other: Perianal and perirectal soft tissues appear somewhat thickened but unchanged. No rim enhancing fluid collection is identified. There is no notable stranding in subcutaneous fat. Musculoskeletal: No acute or focal abnormality. IMPRESSION: Perianal and perirectal soft tissues appear mildly thickened but not grossly different than on the prior CT. No abscess is identified. Electronically Signed   By: Inge Rise M.D.   On: 07/18/2019 21:24   Dg Chest Port 1 View  Result Date: 07/21/2019 CLINICAL DATA:  Syncope.  Breast cancer. EXAM: PORTABLE CHEST 1 VIEW COMPARISON:  August 23, 2012 FINDINGS: Stable cardiomegaly. The hila, mediastinum, lungs, and pleura are unremarkable. IMPRESSION: No active disease. Electronically Signed   By: Dorise Bullion III M.D   On: 07/21/2019 13:27     TODAY-DAY OF  DISCHARGE:  Subjective:   Reni Hausner today has no headache,no chest abdominal pain,no new weakness tingling or numbness, feels much better wants to go home today.   Objective:   Blood pressure (!) 137/93, pulse 67, temperature (!) 97.5 F (36.4 C), temperature source Oral, resp. rate 20, height 5\' 6"  (1.676 m), weight 104.6 kg, last menstrual period 08/18/2014, SpO2 99 %.  Intake/Output Summary (Last 24 hours) at 07/23/2019 0939 Last data filed at 07/23/2019 0559 Gross per 24 hour  Intake 2202 ml  Output 200 ml  Net 2002 ml   Filed Weights   07/21/19 1318 07/23/19 0500  Weight: 102.1 kg 104.6 kg    Exam: Awake Alert, Oriented *3, No new F.N deficits, Normal affect [place].AT,PERRAL Supple Neck,No JVD, No cervical lymphadenopathy appriciated.  Symmetrical Chest wall movement, Good air movement bilaterally, CTAB RRR,No Gallops,Rubs or new Murmurs, No Parasternal Heave +ve B.Sounds, Abd Soft, Non tender, No organomegaly appriciated, No rebound -guarding or rigidity. No Cyanosis, Clubbing  or edema, No new Rash or bruise   PERTINENT RADIOLOGIC STUDIES: Ct Pelvis W Contrast  Result Date: 07/18/2019 CLINICAL DATA:  History of anal fissure and hemorrhoids. Rectal pain. EXAM: CT PELVIS WITH CONTRAST TECHNIQUE: Multidetector CT imaging of the pelvis was performed using the standard protocol following the bolus administration of intravenous contrast. CONTRAST:  75 mL OMNIPAQUE IOHEXOL 300 MG/ML  SOLN COMPARISON:  CT abdomen and pelvis 04/21/2019. FINDINGS: Urinary Tract:  No abnormality visualized. Bowel: Unremarkable visualized pelvic bowel loops. Nodule along the surface of the distal sigmoid colon seen on the prior CT scan is difficult to visualize on this examination. Vascular/Lymphatic: No pathologically enlarged lymph nodes. No significant vascular abnormality seen. Reproductive:  No mass or other significant abnormality Other: Perianal and perirectal soft tissues appear somewhat thickened but unchanged. No rim enhancing fluid collection is identified. There is no notable stranding in subcutaneous fat. Musculoskeletal: No acute or focal abnormality. IMPRESSION: Perianal and perirectal soft tissues appear mildly thickened but not grossly different than on the prior CT. No abscess is identified. Electronically Signed   By: Inge Rise M.D.   On: 07/18/2019 21:24   Dg Chest Port 1 View  Result Date: 07/21/2019 CLINICAL DATA:  Syncope.  Breast cancer. EXAM: PORTABLE CHEST 1 VIEW COMPARISON:  August 23, 2012 FINDINGS: Stable cardiomegaly. The hila, mediastinum, lungs, and pleura are unremarkable. IMPRESSION: No active disease. Electronically Signed   By: Dorise Bullion III M.D   On: 07/21/2019 13:27     PERTINENT LAB RESULTS: CBC: Recent Labs    07/22/19 0250 07/23/19 0558  WBC 8.4 8.1  HGB 13.7 14.1  HCT 42.8 45.7  PLT 317 322   CMET CMP     Component Value Date/Time   NA 137 07/23/2019 0558   NA 140 06/19/2019 1540   NA 138 11/14/2012 0926   K 5.0 07/23/2019 0558     K 2.7 (LL) 11/14/2012 0926   CL 107 07/23/2019 0558   CL 96 (L) 11/14/2012 0926   CO2 21 (L) 07/23/2019 0558   CO2 29 11/14/2012 0926   GLUCOSE 103 (H) 07/23/2019 0558   GLUCOSE 168 (H) 11/14/2012 0926   BUN 15 07/23/2019 0558   BUN 19 06/19/2019 1540   BUN 19 11/14/2012 0926   CREATININE 1.27 (H) 07/23/2019 0558   CREATININE 1.29 (H) 04/28/2019 1341   CREATININE 1.3 (H) 11/14/2012 0926   CALCIUM 8.7 (L) 07/23/2019 0558   CALCIUM 9.5 11/14/2012 0926   PROT 6.4 (L) 07/22/2019 0250  PROT 8.1 11/14/2012 0926   ALBUMIN 3.0 (L) 07/22/2019 0250   ALBUMIN 3.1 (L) 11/14/2012 0926   AST 24 07/22/2019 0250   AST 18 04/28/2019 1341   ALT 17 07/22/2019 0250   ALT 24 04/28/2019 1341   ALT 20 11/14/2012 0926   ALKPHOS 78 07/22/2019 0250   ALKPHOS 61 11/14/2012 0926   BILITOT 1.1 07/22/2019 0250   BILITOT 0.6 04/28/2019 1341   GFRNONAA 50 (L) 07/23/2019 0558   GFRNONAA 49 (L) 04/28/2019 1341   GFRAA 57 (L) 07/23/2019 0558   GFRAA 56 (L) 04/28/2019 1341    GFR Estimated Creatinine Clearance: 65.5 mL/min (A) (by C-G formula based on SCr of 1.27 mg/dL (H)). No results for input(s): LIPASE, AMYLASE in the last 72 hours. No results for input(s): CKTOTAL, CKMB, CKMBINDEX, TROPONINI in the last 72 hours. Invalid input(s): POCBNP No results for input(s): DDIMER in the last 72 hours. No results for input(s): HGBA1C in the last 72 hours. No results for input(s): CHOL, HDL, LDLCALC, TRIG, CHOLHDL, LDLDIRECT in the last 72 hours. No results for input(s): TSH, T4TOTAL, T3FREE, THYROIDAB in the last 72 hours.  Invalid input(s): FREET3 No results for input(s): VITAMINB12, FOLATE, FERRITIN, TIBC, IRON, RETICCTPCT in the last 72 hours. Coags: Recent Labs    07/21/19 1334  INR 1.0   Microbiology: Recent Results (from the past 240 hour(s))  SARS CORONAVIRUS 2 (TAT 6-24 HRS) Nasopharyngeal Nasopharyngeal Swab     Status: None   Collection Time: 07/21/19  1:40 PM   Specimen: Nasopharyngeal  Swab  Result Value Ref Range Status   SARS Coronavirus 2 NEGATIVE NEGATIVE Final    Comment: (NOTE) SARS-CoV-2 target nucleic acids are NOT DETECTED. The SARS-CoV-2 RNA is generally detectable in upper and lower respiratory specimens during the acute phase of infection. Negative results do not preclude SARS-CoV-2 infection, do not rule out co-infections with other pathogens, and should not be used as the sole basis for treatment or other patient management decisions. Negative results must be combined with clinical observations, patient history, and epidemiological information. The expected result is Negative. Fact Sheet for Patients: SugarRoll.be Fact Sheet for Healthcare Providers: https://www.woods-mathews.com/ This test is not yet approved or cleared by the [place] FDA and  has been authorized for detection and/or diagnosis of SARS-CoV-2 by FDA under an Emergency Use Authorization (EUA). This EUA will remain  in effect (meaning this test can be used) for the duration of the COVID-19 declaration under Section 56 4(b)(1) of the Act, 21 U.S.C. section 360bbb-3(b)(1), unless the authorization is terminated or revoked sooner. Performed at [facility], [place] [address], [place], [place] [address]     FURTHER DISCHARGE INSTRUCTIONS:  Get Medicines reviewed and adjusted: Please take all your medications with you for your next visit with your Primary MD  Laboratory/radiological data: Please request your Primary MD to go over all hospital tests and procedure/radiological results at the follow up, please ask your Primary MD to get all Hospital records sent to his/her office.  In some cases, they will be blood work, cultures and biopsy results pending at the time of your discharge. Please request that your primary care M.D. goes through all the records of your hospital data and follows up on these results.  Also Note the following: If  you experience worsening of your admission symptoms, develop shortness of breath, life threatening emergency, suicidal or homicidal thoughts you must seek medical attention immediately by calling 911 or calling your MD immediately  if symptoms  less severe.  You must read complete instructions/literature along with all the possible adverse reactions/side effects for all the Medicines you take and that have been prescribed to you. Take any new Medicines after you have completely understood and accpet all the possible adverse reactions/side effects.   Do not drive when taking Pain medications or sleeping medications (Benzodaizepines)  Do not take more than prescribed Pain, Sleep and Anxiety Medications. It is not advisable to combine anxiety,sleep and pain medications without talking with your primary care practitioner  Special Instructions: If you have smoked or chewed Tobacco  in the last 2 yrs please stop smoking, stop any regular Alcohol  and or any Recreational drug use.  Wear Seat belts while driving.  Please note: You were cared for by a hospitalist during your hospital stay. Once you are discharged, your primary care physician will handle any further medical issues. Please note that NO REFILLS for any discharge medications will be authorized once you are discharged, as it is imperative that you return to your primary care physician (or establish a relationship with a primary care physician if you do not have one) for your post hospital discharge needs so that they can reassess your need for medications and monitor your lab values.  Total Time spent coordinating discharge including counseling, education and face to face time equals 35 minutes.  SignedOren Binet 07/23/2019 9:39 AM

## 2019-07-23 NOTE — Progress Notes (Signed)
Patient had what looked like Wide QRS and HR was tachy, EKG done, now NSR. Provider notified.

## 2019-07-23 NOTE — Plan of Care (Signed)
  Problem: Education: Goal: Knowledge of General Education information will improve Description Including pain rating scale, medication(s)/side effects and non-pharmacologic comfort measures Outcome: Progressing   Problem: Education: Goal: Knowledge of General Education information will improve Description Including pain rating scale, medication(s)/side effects and non-pharmacologic comfort measures Outcome: Progressing   

## 2019-07-24 LAB — URINE CULTURE: Culture: NO GROWTH

## 2019-07-29 ENCOUNTER — Inpatient Hospital Stay: Payer: Medicare HMO | Admitting: Family Medicine

## 2019-07-30 ENCOUNTER — Ambulatory Visit: Payer: Medicare HMO | Admitting: Family

## 2019-08-04 ENCOUNTER — Encounter: Payer: Self-pay | Admitting: Cardiology

## 2019-08-04 ENCOUNTER — Other Ambulatory Visit: Payer: Self-pay

## 2019-08-04 ENCOUNTER — Ambulatory Visit (INDEPENDENT_AMBULATORY_CARE_PROVIDER_SITE_OTHER): Payer: Medicare HMO | Admitting: Cardiology

## 2019-08-04 VITALS — BP 142/82 | HR 89 | Ht 66.0 in | Wt 226.1 lb

## 2019-08-04 DIAGNOSIS — I493 Ventricular premature depolarization: Secondary | ICD-10-CM

## 2019-08-04 DIAGNOSIS — R6889 Other general symptoms and signs: Secondary | ICD-10-CM | POA: Diagnosis not present

## 2019-08-04 NOTE — Progress Notes (Signed)
Electrophysiology Office Note   Date:  08/04/2019   ID:  Amber Bautista, DOB [birth year], MRN [id number]  PCP:  Shelda Pal, DO  Cardiologist:  Bettina Gavia Primary Electrophysiologist:  Paulo Keimig Meredith Leeds, MD    Chief Complaint: PVC   History of Present Illness: Amber Bautista is a 48 y.o. female who is being seen today for the evaluation of PVC at the request of Laurann Montana. Presenting today for electrophysiology evaluation.  She has a history of nonischemic cardiomyopathy, chronic systolic heart failure, hypertension, left bundle branch block, metastatic carcinoid with liver involvement, breast cancer status post chemo, and PVCs.  She wore a cardiac monitor that showed 7% PVCs.  She was admitted to the hospital October 2020.  She was admitted for multiple reasons, but had had an episode of syncope.  It was thought that the syncope was vagal in nature.  She was noted to have high burden of PVCs while in the hospital.  It was thought that her PVCs were not contributing to her heart failure as her ejection fraction had actually improved to 45% from 25 to 30% previously.  Today, she denies symptoms of palpitations, chest pain, shortness of breath, orthopnea, PND, lower extremity edema, claudication, dizziness, presyncope, syncope, bleeding, or neurologic sequela. The patient is tolerating medications without difficulties.  She did feel palpitations until she was put on Entresto.  Since being put on Entresto, her ejection fraction has improved and she is felt much better with quite a bit less palpitations.   Past Medical History:  Diagnosis Date  . Abdominal pain 12/17/2014  . Breast cancer ([facility]) 07/12/12   Stage IIB Ductal Carcinoma to  Upper Outer Quadrant: Left Breast: Triple Negative  . Cancer ([facility]) 03/03/2015   Overview:  in intestine-2007, in liver-2007, and left breast-2013  . Cancer of upper-outer quadrant of female breast ([facility]) 08/01/2012   Image guided biopsy Oct.,  2013 -    2 cm by MRI, solitary;     IDC; TNBC; positive LVI;      . Carcinoid tumor of intestine 2001   [place] 2007  . Carcinoma of breast treated with adjuvant chemotherapy ([facility])    4 cycles of Carboplatin / Taxol  . Chronic congestive heart failure ([facility]) 03/17/2019  . Congestive heart failure (CHF) ([facility])   . Diarrhea    Sandostatin  . Frequent PVCs 04/01/2019  . Goals of care, counseling/discussion 03/31/2019  . Hypertension   . Metastatic carcinoid tumor ([facility])   . Morbidly obese ([facility])   . S/P radiation therapy 02/04/2013-02/21/2013   Left Breast and Axilla / 46.8 Gy in 26 fractions were planned (she only received 18 Gy in 10 fractions)  . Syncope 07/2019   Past Surgical History:  Procedure Laterality Date  . BREAST SURGERY  08/23/12   Lt br lumpectomy  . [place] 2007  . COLON SURGERY  2001   Cancer/ Intestinal Resection  . OVARIAN CYST REMOVAL  2001  . PARTIAL MASTECTOMY WITH NEEDLE LOCALIZATION AND AXILLARY SENTINEL LYMPH NODE BX  08/23/2012   Procedure: PARTIAL MASTECTOMY WITH NEEDLE LOCALIZATION AND AXILLARY SENTINEL LYMPH NODE BX;  Surgeon: Adin Hector, MD;  Location: [facility];  Service: General;  Laterality: Left;  blue dye injection   . PORTACATH PLACEMENT  08/23/2012   Procedure: INSERTION PORT-A-CATH;  Surgeon: Adin Hector, MD;  Location: [facility];  Service: General;  Laterality: Right;  intraop ultrasound     Current Outpatient Medications  Medication Sig Dispense  Refill  . acetaminophen (TYLENOL) 500 MG tablet Take 500 mg by mouth every 6 (six) hours as needed for moderate pain.    . furosemide (LASIX) 20 MG tablet Take 1 tablet (20 mg total) by mouth daily. 30 tablet 0  . magnesium chloride (SLOW-MAG) 64 MG TBEC SR tablet Take 1 tablet (64 mg total) by mouth 2 (two) times daily. 120 tablet 0  . metoprolol succinate (TOPROL-XL) 50 MG 24 hr tablet Take 1 tablet (50 mg total) by mouth daily. Take with or immediately following a meal. 90 tablet 1  .  potassium chloride SA (K-DUR) 20 MEQ tablet Take 1 tablet (20 mEq total) by mouth 2 (two) times daily. 60 tablet 0  . sacubitril-valsartan (ENTRESTO) 49-51 MG Take 1 tablet by mouth 2 (two) times daily. 60 tablet 2  . spironolactone (ALDACTONE) 25 MG tablet Take 1 tablet (25 mg total) by mouth daily. 30 tablet 0   No current facility-administered medications for this visit.     Allergies:   Sulfamethoxazole-trimethoprim and Adhesive [tape]   Social History:  The patient  reports that she has never smoked. She has never used smokeless tobacco. She reports that she does not drink alcohol or use drugs.   Family History:  The patient's family history includes Asthma in her brother; Cancer in her paternal grandmother; Diabetes in her father and mother; Glaucoma in her mother; Heart attack in her father; Hypertension in her brother and mother.    ROS:  Please see the history of present illness.   Otherwise, review of systems is positive for none.   All other systems are reviewed and negative.    PHYSICAL EXAM: VS:  BP (!) 142/82   Pulse 89   Ht 5\' 6"  (1.676 m)   Wt 226 lb 1.9 oz (102.6 kg)   LMP 08/18/2014 (LMP Unknown) Comment: menopausal: chemo induced  BMI 36.50 kg/m  , BMI Body mass index is 36.5 kg/m. GEN: Well nourished, well developed, in no acute distress  HEENT: normal  Neck: no JVD, carotid bruits, or masses Cardiac: RRR; no murmurs, rubs, or gallops,no edema  Respiratory:  clear to auscultation bilaterally, normal work of breathing GI: soft, nontender, nondistended, + BS MS: no deformity or atrophy  Skin: warm and dry Neuro:  Strength and sensation are intact Psych: euthymic mood, full affect  EKG:  EKG is ordered today. Personal review of the ekg ordered shows sinus rhythm, rate 89  Recent Labs: 05/20/2019: NT-Pro BNP 1,071 07/21/2019: B Natriuretic Peptide 69.8 07/22/2019: ALT 17 07/23/2019: BUN 15; Creatinine, Ser 1.27; Hemoglobin 14.1; Magnesium 1.9; Platelets 322;  Potassium 5.0; Sodium 137    Lipid Panel  No results found for: CHOL, TRIG, HDL, CHOLHDL, VLDL, LDLCALC, LDLDIRECT   Wt Readings from Last 3 Encounters:  08/04/19 226 lb 1.9 oz (102.6 kg)  07/23/19 230 lb 11.2 oz (104.6 kg)  07/18/19 226 lb (102.5 kg)      Other studies Reviewed: Additional studies/ records that were reviewed today include: TTE 05/28/19  Review of the above records today demonstrates:   1. Mild hypokinesis of the left ventricular, entire septal wall.  2. The left ventricle has mildly reduced systolic function, with an ejection fraction of 45-50%. The cavity size was normal. There is moderately increased left ventricular wall thickness. Left ventricular diastolic Doppler parameters are consistent with  impaired relaxation.  3. The right ventricle has normal systolic function. The cavity was normal. There is no increase in right ventricular wall thickness.  4.  Right atrial size was mildly dilated.  5. Tricuspid valve regurgitation is moderate-severe.  6. Mild pulmonic stenosis.  7. The aorta is normal unless otherwise noted.  8. The inferior vena cava was normal in size with <50% respiratory variability.  9. TDS, cinsider MRI for better assesment of RV and TR  Zio 05/02/19 personally reviewed The rhythm was sinus throughout the recording with minimum average and maximum heart rates of 58, 93 and 131 bpm. Ventricular ectopy was frequent 7.4% incidence of isolated PVCs less than 1% incidence of couplets and triplets.  The longest episode of bigeminy 23 minutes 28 seconds in the longest episode of trigeminy 1 minute 26 seconds.  ASSESSMENT AND PLAN:  1.  PVC: Have a left bundle branch block morphology, and could potentially be outflow tract related, though they are not positive in lead III.  Fortunately she is having a low burden at 7%.  Due to that, I do not feel that she needs any acute therapy for her PVCs.  She does have a cardiomyopathy, but her low burden likely is  not contributing.  She is currently working to potentially get her carcinoid treated and her hemorrhoids treated.  We Jet Armbrust see her back in 6 months for further discussions.    Current medicines are reviewed at length with the patient today.   The patient does not have concerns regarding her medicines.  The following changes were made today:  none  Labs/ tests ordered today include:  Orders Placed This Encounter  Procedures  . EKG 12-Lead     Disposition:   FU with Kaleeyah Cuffie 6 months  Signed, Zuzanna Maroney Meredith Leeds, MD  08/04/2019 4:05 PM     [place] [address] [place] [address] [phone] (office) [phone] (fax)

## 2019-08-13 DIAGNOSIS — K625 Hemorrhage of anus and rectum: Secondary | ICD-10-CM | POA: Insufficient documentation

## 2019-08-13 DIAGNOSIS — D3A098 Benign carcinoid tumors of other sites: Secondary | ICD-10-CM | POA: Insufficient documentation

## 2019-08-13 DIAGNOSIS — K602 Anal fissure, unspecified: Secondary | ICD-10-CM | POA: Insufficient documentation

## 2019-08-13 DIAGNOSIS — C7B09 Secondary carcinoid tumors of other sites: Secondary | ICD-10-CM | POA: Insufficient documentation

## 2019-08-14 ENCOUNTER — Telehealth: Payer: Self-pay | Admitting: Internal Medicine

## 2019-08-14 MED ORDER — ENTRESTO 49-51 MG PO TABS: 1.0000 | ORAL_TABLET | Freq: Two times a day (BID) | ORAL | 1 refills | Status: DC

## 2019-08-14 NOTE — Telephone Encounter (Signed)
Okay thanks for letting me know

## 2019-08-14 NOTE — Telephone Encounter (Signed)
Spoke to Dr. Hilliard Clark, i.e. [facility] colorectal surgeon that opened in office in [place] about this patient.  She has an anal fissure and he thinks may be a fistula and is going to do an exam under anesthesia and take care of that and possibly put Botox.  I told him I would let Dr. Havery Moros, her primary GI doctor know.

## 2019-08-15 ENCOUNTER — Telehealth: Payer: Self-pay | Admitting: *Deleted

## 2019-08-15 NOTE — Telephone Encounter (Signed)
Call placed to patient to check on her status per order of Dr. Marin Olp.  Patient states that she is having surgery next week for an anal fistula and will contact this office to make an appt to see Dr. Marin Olp as soon as she is healed from surgery.  Pt appreciative of call and Dr. Marin Olp notified.

## 2019-08-28 ENCOUNTER — Encounter: Payer: Self-pay | Admitting: Hematology & Oncology

## 2019-08-28 ENCOUNTER — Telehealth: Payer: Self-pay | Admitting: Hematology & Oncology

## 2019-08-28 NOTE — Telephone Encounter (Signed)
Called and spoke with patient regarding appointments being moved up per 11/19 patient advice request

## 2019-08-29 ENCOUNTER — Other Ambulatory Visit: Payer: Self-pay

## 2019-08-29 ENCOUNTER — Encounter: Payer: Self-pay | Admitting: Hematology & Oncology

## 2019-08-29 ENCOUNTER — Inpatient Hospital Stay: Payer: Medicare HMO | Attending: Hematology & Oncology

## 2019-08-29 ENCOUNTER — Inpatient Hospital Stay: Payer: Medicare HMO

## 2019-08-29 ENCOUNTER — Inpatient Hospital Stay (HOSPITAL_BASED_OUTPATIENT_CLINIC_OR_DEPARTMENT_OTHER): Payer: Medicare HMO | Admitting: Hematology & Oncology

## 2019-08-29 ENCOUNTER — Telehealth: Payer: Self-pay | Admitting: *Deleted

## 2019-08-29 VITALS — BP 137/93 | HR 93 | Temp 97.3°F | Resp 18 | Wt 237.0 lb

## 2019-08-29 DIAGNOSIS — C7B02 Secondary carcinoid tumors of liver: Secondary | ICD-10-CM | POA: Insufficient documentation

## 2019-08-29 DIAGNOSIS — Z853 Personal history of malignant neoplasm of breast: Secondary | ICD-10-CM | POA: Diagnosis not present

## 2019-08-29 DIAGNOSIS — C7A019 Malignant carcinoid tumor of the small intestine, unspecified portion: Secondary | ICD-10-CM | POA: Diagnosis present

## 2019-08-29 DIAGNOSIS — E876 Hypokalemia: Secondary | ICD-10-CM

## 2019-08-29 DIAGNOSIS — D3A098 Benign carcinoid tumors of other sites: Secondary | ICD-10-CM | POA: Diagnosis not present

## 2019-08-29 DIAGNOSIS — C50411 Malignant neoplasm of upper-outer quadrant of right female breast: Secondary | ICD-10-CM | POA: Diagnosis not present

## 2019-08-29 DIAGNOSIS — Z171 Estrogen receptor negative status [ER-]: Secondary | ICD-10-CM | POA: Insufficient documentation

## 2019-08-29 DIAGNOSIS — Z79899 Other long term (current) drug therapy: Secondary | ICD-10-CM | POA: Insufficient documentation

## 2019-08-29 DIAGNOSIS — I509 Heart failure, unspecified: Secondary | ICD-10-CM | POA: Insufficient documentation

## 2019-08-29 LAB — CBC WITH DIFFERENTIAL (CANCER CENTER ONLY)
Abs Immature Granulocytes: 0.02 10*3/uL (ref 0.00–0.07)
Basophils Absolute: 0 10*3/uL (ref 0.0–0.1)
Basophils Relative: 0 %
Eosinophils Absolute: 0.1 10*3/uL (ref 0.0–0.5)
Eosinophils Relative: 2 %
HCT: 44 % (ref 36.0–46.0)
Hemoglobin: 13.8 g/dL (ref 12.0–15.0)
Immature Granulocytes: 0 %
Lymphocytes Relative: 29 %
Lymphs Abs: 2.1 10*3/uL (ref 0.7–4.0)
MCH: 29.5 pg (ref 26.0–34.0)
MCHC: 31.4 g/dL (ref 30.0–36.0)
MCV: 94 fL (ref 80.0–100.0)
Monocytes Absolute: 0.3 10*3/uL (ref 0.1–1.0)
Monocytes Relative: 4 %
Neutro Abs: 4.6 10*3/uL (ref 1.7–7.7)
Neutrophils Relative %: 65 %
Platelet Count: 331 10*3/uL (ref 150–400)
RBC: 4.68 MIL/uL (ref 3.87–5.11)
RDW: 13.8 % (ref 11.5–15.5)
WBC Count: 7.2 10*3/uL (ref 4.0–10.5)
nRBC: 0 % (ref 0.0–0.2)

## 2019-08-29 LAB — CMP (CANCER CENTER ONLY)
ALT: 17 U/L (ref 0–44)
AST: 15 U/L (ref 15–41)
Albumin: 3.7 g/dL (ref 3.5–5.0)
Alkaline Phosphatase: 75 U/L (ref 38–126)
Anion gap: 11 (ref 5–15)
BUN: 22 mg/dL — ABNORMAL HIGH (ref 6–20)
CO2: 22 mmol/L (ref 22–32)
Calcium: 9.1 mg/dL (ref 8.9–10.3)
Chloride: 106 mmol/L (ref 98–111)
Creatinine: 1.27 mg/dL — ABNORMAL HIGH (ref 0.44–1.00)
GFR, Est AFR Am: 57 mL/min — ABNORMAL LOW (ref >60)
GFR, Estimated: 50 mL/min — ABNORMAL LOW (ref >60)
Glucose, Bld: 115 mg/dL — ABNORMAL HIGH (ref 70–99)
Potassium: 2.9 mmol/L — CL (ref 3.5–5.1)
Sodium: 139 mmol/L (ref 135–145)
Total Bilirubin: 0.8 mg/dL (ref 0.3–1.2)
Total Protein: 7.1 g/dL (ref 6.5–8.1)

## 2019-08-29 MED ORDER — LANREOTIDE ACETATE 120 MG/0.5ML ~~LOC~~ SOLN
SUBCUTANEOUS | Status: AC
  Filled 2019-08-29: qty 120

## 2019-08-29 MED ORDER — LANREOTIDE ACETATE 120 MG/0.5ML ~~LOC~~ SOLN
120.0000 mg | Freq: Once | SUBCUTANEOUS | Status: AC
  Administered 2019-08-29: 120 mg via SUBCUTANEOUS

## 2019-08-29 MED ORDER — POTASSIUM CHLORIDE CRYS ER 20 MEQ PO TBCR
40.0000 meq | EXTENDED_RELEASE_TABLET | Freq: Once | ORAL | Status: AC
  Administered 2019-08-29: 40 meq via ORAL
  Filled 2019-08-29: qty 2

## 2019-08-29 NOTE — Progress Notes (Signed)
Hematology and Oncology Follow Up Visit  Amber Bautista [id number] [birth year] 48 y.o. 08/29/2019   Principle Diagnosis:   Metastatic carcinoid --progressive with liver metastasis  Stage IIb (T2N1M0) ductal carcinoma of the left breast  -- Triple negative  Congestive heart failure  Current Therapy:    Somatuline 120 mg IM monthly -- re-started on 08/29/2019     Interim History:  Ms. Amber Bautista is back for follow-up.  Is been a while since we last saw her.  At the last time we saw her was back in July.  Since then, she has had a lot of problems with her lower gastrointestinal system.  She apparently developed an anal fissure and then had some kind of fistula.  She is being seen by gastroenterology to deal with this.  Because of all this, she has not been able to come back to deal with the carcinoid.  Her carcinoid I would have to say is fairly aggressive.  We did do a dotatate PET scan on her.  This was done on 05/14/2019.  Clearly, she has quite active disease.  She has active disease in the mediastinum.  She has mediastinal or hilar lymph nodes.  She has extensive liver metastasis.  She has abdominal and pelvic adenopathy.  She has peritoneal disease.  Her last chromogranin A was over 4800.  She cannot afford the Lutathera protocol.  I am not sure if she will be able to afford the oral treatments.  I would seriously consider her with therapy using capecitabine and Temodar.  However, I think this would be quite expensive for her.  She is not having diarrhea.  She does have marked hypokalemia.  She is on Lasix.  She also is on spironolactone.  We gave her some extra potassium in the office today.  I really do not think her breast cancer really is an issue.  There has been no cough.  Her cardiomyopathy seems to be doing better.  She does see cardiology for this.  Her last echocardiogram was done on 05/23/2019.  This showed an ejection fraction of 45-50%.  Her appetite is down.  Her weight  is not down any.  Overall, her performance status is ECOG 1.   Medications:  Current Outpatient Medications:  .  magnesium chloride (SLOW-MAG) 64 MG TBEC SR tablet, Take 1 tablet (64 mg total) by mouth 2 (two) times daily., Disp: 120 tablet, Rfl: 0 .  metoprolol succinate (TOPROL-XL) 50 MG 24 hr tablet, Take 1 tablet (50 mg total) by mouth daily. Take with or immediately following a meal., Disp: 90 tablet, Rfl: 1 .  potassium chloride SA (K-DUR) 20 MEQ tablet, Take 1 tablet (20 mEq total) by mouth 2 (two) times daily., Disp: 60 tablet, Rfl: 0 .  sacubitril-valsartan (ENTRESTO) 49-51 MG, Take 1 tablet by mouth 2 (two) times daily., Disp: 180 tablet, Rfl: 1 .  spironolactone (ALDACTONE) 25 MG tablet, Take 25 mg by mouth daily., Disp: , Rfl:  .  acetaminophen (TYLENOL) 500 MG tablet, Take 500 mg by mouth every 6 (six) hours as needed for moderate pain., Disp: , Rfl:  .  furosemide (LASIX) 20 MG tablet, Take 1 tablet (20 mg total) by mouth daily., Disp: 30 tablet, Rfl: 0 .  spironolactone (ALDACTONE) 25 MG tablet, Take 1 tablet (25 mg total) by mouth daily., Disp: 30 tablet, Rfl: 0  Allergies:  Allergies  Allergen Reactions  . Sulfamethoxazole-Trimethoprim Swelling and Hypertension    Swelling and hypertension after taking, was admitted to hospital  .  Adhesive [Tape] Rash and Other (See Comments)    Causes SEVERE RASHES AND BLISTERS    Past Medical History, Surgical history, Social history, and Family History were reviewed and updated.  Review of Systems: Review of Systems  Constitutional: Positive for fatigue.  HENT:  Negative.   Eyes: Negative.   Respiratory: Positive for shortness of breath.   Cardiovascular: Positive for palpitations.  Gastrointestinal: Positive for diarrhea and nausea.  Endocrine: Negative.   Genitourinary: Negative.    Musculoskeletal: Positive for back pain and myalgias.  Skin: Negative.   Neurological: Positive for light-headedness. Negative for numbness.   Hematological: Negative.   Psychiatric/Behavioral: Negative.     Physical Exam:  weight is 237 lb (107.5 kg). Her temporal temperature is 97.3 F (36.3 C) (abnormal). Her blood pressure is 137/93 (abnormal) and her pulse is 93. Her respiration is 18 and oxygen saturation is 98%.   Wt Readings from Last 3 Encounters:  08/29/19 237 lb (107.5 kg)  08/04/19 226 lb 1.9 oz (102.6 kg)  07/23/19 230 lb 11.2 oz (104.6 kg)    Physical Exam Vitals signs reviewed.  HENT:     Head: Normocephalic and atraumatic.  Eyes:     Pupils: Pupils are equal, round, and reactive to light.  Neck:     Musculoskeletal: Normal range of motion.  Cardiovascular:     Rate and Rhythm: Normal rate and regular rhythm.     Heart sounds: Normal heart sounds.  Pulmonary:     Effort: Pulmonary effort is normal.     Breath sounds: Normal breath sounds.  Abdominal:     General: Bowel sounds are normal.     Palpations: Abdomen is soft.  Musculoskeletal: Normal range of motion.        General: No tenderness or deformity.  Lymphadenopathy:     Cervical: No cervical adenopathy.  Skin:    General: Skin is warm and dry.     Findings: No erythema or rash.  Neurological:     Mental Status: She is alert and oriented to person, place, and time.  Psychiatric:        Behavior: Behavior normal.        Thought Content: Thought content normal.        Judgment: Judgment normal.      Lab Results  Component Value Date   WBC 7.2 08/29/2019   HGB 13.8 08/29/2019   HCT 44.0 08/29/2019   MCV 94.0 08/29/2019   PLT 331 08/29/2019     Chemistry      Component Value Date/Time   NA 139 08/29/2019 1048   NA 140 06/19/2019 1540   NA 138 11/14/2012 0926   K 2.9 (LL) 08/29/2019 1048   K 2.7 (LL) 11/14/2012 0926   CL 106 08/29/2019 1048   CL 96 (L) 11/14/2012 0926   CO2 22 08/29/2019 1048   CO2 29 11/14/2012 0926   BUN 22 (H) 08/29/2019 1048   BUN 19 06/19/2019 1540   BUN 19 11/14/2012 0926   CREATININE 1.27 (H)  08/29/2019 1048   CREATININE 1.3 (H) 11/14/2012 0926      Component Value Date/Time   CALCIUM 9.1 08/29/2019 1048   CALCIUM 9.5 11/14/2012 0926   ALKPHOS 75 08/29/2019 1048   ALKPHOS 61 11/14/2012 0926   AST 15 08/29/2019 1048   ALT 17 08/29/2019 1048   ALT 20 11/14/2012 0926   BILITOT 0.8 08/29/2019 1048       Impression and Plan: Ms. Vidrine is a 48 year old Hispanic female  with a past history of infiltrating ductal carcinoma of the left breast.  This was about 8 years ago.  I really do not think this is the issue right now.  Hopefully, we will be able to keep her on the Somatuline.  She really needs to have his monthly given the extent of her disease.  I would hate to have to go with chemotherapy for this.  However, I do have a very low threshold for switching to something more aggressive.  I know that the capecitabine/Temodar combination would probably lead to a more quick response.  I am just glad that we are able to get her back to the office.  I hope that the gastroenterologist will be able to help the fistula issue.  I want to get her back in 1 month.  I spent about 40 minutes with her today.  We had not seen her for a while.  This was somewhat complicated given the extent of her disease in the liver and elsewhere according to the dotatate PET scan.    Volanda Napoleon, MD 11/20/202011:29 AM

## 2019-08-29 NOTE — Patient Instructions (Signed)
Lanreotide injection (Somatuline) What is this medicine? LANREOTIDE (lan REE oh tide) is used to reduce blood levels of growth hormone in patients with a condition called acromegaly. It also works to slow or stop tumor growth in patients with neuroendocrine tumors and treat carcinoid syndrome. This medicine may be used for other purposes; ask your health care provider or pharmacist if you have questions. COMMON BRAND NAME(S): Somatuline [place] What should I tell my health care provider before I take this medicine? They need to know if you have any of these conditions:  diabetes  gallbladder disease  heart disease  kidney disease  liver disease  thyroid disease  an unusual or allergic reaction to lanreotide, other medicines, foods, dyes, or preservatives  pregnant or trying to get pregnant  breast-feeding How should I use this medicine? This medicine is for injection under the skin. It is given by a health care professional in a hospital or clinic setting. Contact your pediatrician or health care professional regarding the use of this medicine in children. Special care may be needed. Overdosage: If you think you have taken too much of this medicine contact a poison control center or emergency room at once. NOTE: This medicine is only for you. Do not share this medicine with others. What if I miss a dose? It is important not to miss your dose. Call your doctor or health care professional if you are unable to keep an appointment. What may interact with this medicine? This medicine may interact with the following medications:  bromocriptine  cyclosporine  certain medicines for blood pressure, heart disease, irregular heart beat  certain medicines for diabetes  quinidine  terfenadine This list may not describe all possible interactions. Give your health care provider a list of all the medicines, herbs, non-prescription drugs, or dietary supplements you use. Also tell them if  you smoke, drink alcohol, or use illegal drugs. Some items may interact with your medicine. What should I watch for while using this medicine? Tell your doctor or healthcare professional if your symptoms do not start to get better or if they get worse. Visit your doctor or health care professional for regular checks on your progress. Your condition will be monitored carefully while you are receiving this medicine. This medicine may increase blood sugar. Ask your healthcare provider if changes in diet or medicines are needed if you have diabetes. You may need blood work done while you are taking this medicine. Women should inform their doctor if they wish to become pregnant or think they might be pregnant. There is a potential for serious side effects to an unborn child. Talk to your health care professional or pharmacist for more information. Do not breast-feed an infant while taking this medicine or for 6 months after stopping it. This medicine has caused ovarian failure in some women. This medicine may interfere with the ability to have a child. Talk with your doctor or health care professional if you are concerned about your fertility. What side effects may I notice from receiving this medicine? Side effects that you should report to your doctor or health care professional as soon as possible:  allergic reactions like skin rash, itching or hives, swelling of the face, lips, or tongue  increased blood pressure  severe stomach pain  signs and symptoms of hgh blood sugar such as being more thirsty or hungry or having to urinate more than normal. You may also feel very tired or have blurry vision.  signs and symptoms of low  blood sugar such as feeling anxious; confusion; dizziness; increased hunger; unusually weak or tired; sweating; shakiness; cold; irritable; headache; blurred vision; fast heartbeat; loss of consciousness  unusually slow heartbeat Side effects that usually do not require  medical attention (report to your doctor or health care professional if they continue or are bothersome):  constipation  diarrhea  dizziness  headache  muscle pain  muscle spasms  nausea  pain, redness, or irritation at site where injected This list may not describe all possible side effects. Call your doctor for medical advice about side effects. You may report side effects to FDA at 1-800-FDA-1088. Where should I keep my medicine? This drug is given in a hospital or clinic and will not be stored at home. NOTE: This sheet is a summary. It may not cover all possible information. If you have questions about this medicine, talk to your doctor, pharmacist, or health care provider.  2020 Elsevier/Gold Standard (2018-07-04 09:13:08)

## 2019-08-29 NOTE — Telephone Encounter (Signed)
Dr. Marin Olp notified of K-2.9.  No new orders received at this time.

## 2019-09-01 LAB — LACTATE DEHYDROGENASE: LDH: 173 U/L (ref 98–192)

## 2019-09-02 LAB — CHROMOGRANIN A: Chromogranin A (ng/mL): 3121 ng/mL — ABNORMAL HIGH (ref 0.0–101.8)

## 2019-09-22 ENCOUNTER — Ambulatory Visit: Payer: Medicare HMO | Admitting: Cardiology

## 2019-09-29 ENCOUNTER — Ambulatory Visit: Payer: Medicare HMO | Admitting: Hematology & Oncology

## 2019-09-29 ENCOUNTER — Other Ambulatory Visit: Payer: Medicare HMO

## 2019-10-01 ENCOUNTER — Ambulatory Visit: Payer: Medicare HMO | Admitting: Hematology & Oncology

## 2019-10-01 ENCOUNTER — Other Ambulatory Visit: Payer: Medicare HMO

## 2019-10-01 ENCOUNTER — Ambulatory Visit: Payer: Medicare HMO

## 2019-10-07 ENCOUNTER — Other Ambulatory Visit: Payer: Self-pay | Admitting: Family

## 2019-10-07 ENCOUNTER — Encounter: Payer: Self-pay | Admitting: Hematology & Oncology

## 2019-10-07 DIAGNOSIS — C50411 Malignant neoplasm of upper-outer quadrant of right female breast: Secondary | ICD-10-CM

## 2019-10-09 ENCOUNTER — Other Ambulatory Visit: Payer: Self-pay | Admitting: Family

## 2019-10-09 DIAGNOSIS — C50411 Malignant neoplasm of upper-outer quadrant of right female breast: Secondary | ICD-10-CM

## 2019-10-13 ENCOUNTER — Other Ambulatory Visit: Payer: Self-pay

## 2019-10-13 ENCOUNTER — Emergency Department (HOSPITAL_COMMUNITY)
Admission: EM | Admit: 2019-10-13 | Discharge: 2019-10-14 | Disposition: A | Discharge status: Home / Self Care | Payer: Medicare HMO | Attending: Emergency Medicine | Admitting: Emergency Medicine

## 2019-10-13 ENCOUNTER — Emergency Department (HOSPITAL_COMMUNITY): Payer: Medicare HMO

## 2019-10-13 ENCOUNTER — Encounter (HOSPITAL_COMMUNITY): Payer: Self-pay | Admitting: Emergency Medicine

## 2019-10-13 DIAGNOSIS — R0789 Other chest pain: Secondary | ICD-10-CM | POA: Diagnosis not present

## 2019-10-13 DIAGNOSIS — I509 Heart failure, unspecified: Secondary | ICD-10-CM

## 2019-10-13 DIAGNOSIS — Z79899 Other long term (current) drug therapy: Secondary | ICD-10-CM | POA: Diagnosis not present

## 2019-10-13 DIAGNOSIS — R0602 Shortness of breath: Secondary | ICD-10-CM | POA: Diagnosis present

## 2019-10-13 DIAGNOSIS — I110 Hypertensive heart disease with heart failure: Secondary | ICD-10-CM | POA: Diagnosis not present

## 2019-10-13 DIAGNOSIS — I5022 Chronic systolic (congestive) heart failure: Secondary | ICD-10-CM | POA: Diagnosis not present

## 2019-10-13 DIAGNOSIS — R21 Rash and other nonspecific skin eruption: Secondary | ICD-10-CM | POA: Diagnosis not present

## 2019-10-13 LAB — CBC WITH DIFFERENTIAL/PLATELET
Abs Immature Granulocytes: 0.02 10*3/uL (ref 0.00–0.07)
Basophils Absolute: 0 10*3/uL (ref 0.0–0.1)
Basophils Relative: 1 %
Eosinophils Absolute: 0.4 10*3/uL (ref 0.0–0.5)
Eosinophils Relative: 6 %
HCT: 47.4 % — ABNORMAL HIGH (ref 36.0–46.0)
Hemoglobin: 14.3 g/dL (ref 12.0–15.0)
Immature Granulocytes: 0 %
Lymphocytes Relative: 34 %
Lymphs Abs: 2.2 10*3/uL (ref 0.7–4.0)
MCH: 28.8 pg (ref 26.0–34.0)
MCHC: 30.2 g/dL (ref 30.0–36.0)
MCV: 95.6 fL (ref 80.0–100.0)
Monocytes Absolute: 0.2 10*3/uL (ref 0.1–1.0)
Monocytes Relative: 3 %
Neutro Abs: 3.7 10*3/uL (ref 1.7–7.7)
Neutrophils Relative %: 56 %
Platelets: 312 10*3/uL (ref 150–400)
RBC: 4.96 MIL/uL (ref 3.87–5.11)
RDW: 15.1 % (ref 11.5–15.5)
WBC: 6.5 10*3/uL (ref 4.0–10.5)
nRBC: 0 % (ref 0.0–0.2)

## 2019-10-13 LAB — BASIC METABOLIC PANEL
Anion gap: 11 (ref 5–15)
BUN: 27 mg/dL — ABNORMAL HIGH (ref 6–20)
CO2: 19 mmol/L — ABNORMAL LOW (ref 22–32)
Calcium: 9 mg/dL (ref 8.9–10.3)
Chloride: 108 mmol/L (ref 98–111)
Creatinine, Ser: 1.66 mg/dL — ABNORMAL HIGH (ref 0.44–1.00)
GFR calc Af Amer: 42 mL/min — ABNORMAL LOW (ref >60)
GFR calc non Af Amer: 36 mL/min — ABNORMAL LOW (ref >60)
Glucose, Bld: 111 mg/dL — ABNORMAL HIGH (ref 70–99)
Potassium: 3.4 mmol/L — ABNORMAL LOW (ref 3.5–5.1)
Sodium: 138 mmol/L (ref 135–145)

## 2019-10-13 LAB — BRAIN NATRIURETIC PEPTIDE: B Natriuretic Peptide: 301.6 pg/mL — ABNORMAL HIGH (ref 0.0–100.0)

## 2019-10-13 NOTE — ED Triage Notes (Addendum)
Patient presents to the ED by EMS with c/o SOB and a rash x3 days. No known allergies. Hx CHF and a.FIB. Bilateral pitting edema. EKG multifocal pvc's and new LBBB.  4 mg IM Zofran  50 MG Benadryl PO   VSS 68 p 24 Resp 150/90 bp

## 2019-10-13 NOTE — ED Notes (Signed)
Pt states that she feels very weak and like she is about to pass out, states she passes out frequently.

## 2019-10-14 MED ORDER — FUROSEMIDE 10 MG/ML IJ SOLN
40.0000 mg | INTRAMUSCULAR | Status: AC
  Administered 2019-10-14: 40 mg via INTRAVENOUS
  Filled 2019-10-14: qty 4

## 2019-10-14 MED ORDER — CETIRIZINE HCL 10 MG PO TABS: 10.0000 mg | ORAL_TABLET | Freq: Every day | ORAL | 0 refills | Status: DC

## 2019-10-14 NOTE — ED Provider Notes (Signed)
[facility] EMERGENCY DEPARTMENT Provider Note   CSN: [id number] Arrival date & time: 10/13/19  1217     History Chief Complaint  Patient presents with  . Shortness of Breath  . Rash    Amber Bautista is a 49 y.o. female.  Patient presents to the emergency department with a chief complaint of chest pain or shortness of breath.  She states that the shortness of breath has been gradually worsening over the past day.  She does have a history of congestive heart failure.  She states that she has been taking her fluid pills.  She states that the symptoms have been worsening.  They are particularly worse when she walks, or when she lies down.  She denies any fever, chills, or cough.  Additionally, she states that she has a new rash that is developed on her extremities.  She states that it is very itchy.  It has developed in the past day.  She denies any medication changes.  Denies any new chemical irritants, or staying anywhere new.  She had some relief with Benadryl.  The history is provided by the patient. No language interpreter was used.       Past Medical History:  Diagnosis Date  . Abdominal pain 12/17/2014  . Breast cancer ([place]) 07/12/12   Stage IIB Ductal Carcinoma to  Upper Outer Quadrant: Left Breast: Triple Negative  . Cancer ([place]) 03/03/2015   Overview:  in intestine-2007, in liver-2007, and left breast-2013  . Cancer of upper-outer quadrant of female breast ([place]) 08/01/2012   Image guided biopsy Oct., 2013 -    2 cm by MRI, solitary;     IDC; TNBC; positive LVI;      . Carcinoid tumor of intestine 2001   Mets 2007  . Carcinoma of breast treated with adjuvant chemotherapy ([place])    4 cycles of Carboplatin / Taxol  . Chronic congestive heart failure ([place]) 03/17/2019  . Congestive heart failure (CHF) ([place])   . Diarrhea    Sandostatin  . Frequent PVCs 04/01/2019  . Goals of care, counseling/discussion 03/31/2019  . Hypertension   . Metastatic carcinoid tumor  ([place])   . Morbidly obese ([place])   . S/P radiation therapy 02/04/2013-02/21/2013   Left Breast and Axilla / 46.8 Gy in 26 fractions were planned (she only received 18 Gy in 10 fractions)  . Syncope 07/2019    Patient Active Problem List   Diagnosis Date Noted  . Syncope 07/21/2019  . Morbidly obese ([place]) 07/21/2019  . Bigeminy 06/20/2019  . Dilated cardiomyopathy ([place]) 04/02/2019  . Hypokalemia 04/02/2019  . Hypertensive heart disease with heart failure ([place]) 04/02/2019  . Frequent PVCs 04/01/2019  . Goals of care, counseling/discussion 03/31/2019  . Chronic systolic heart failure ([place]) 03/17/2019  . Cancer ([place]) 03/03/2015  . Abdominal pain 12/17/2014  . Breast cancer ([place])   . Cancer of upper-outer quadrant of female breast ([place]) 08/01/2012  . Carcinoid tumor of intestine 03/18/2012    Past Surgical History:  Procedure Laterality Date  . BREAST SURGERY  08/23/12   Lt br lumpectomy  . [place] 2007  . COLON SURGERY  2001   Cancer/ Intestinal Resection  . OVARIAN CYST REMOVAL  2001  . PARTIAL MASTECTOMY WITH NEEDLE LOCALIZATION AND AXILLARY SENTINEL LYMPH NODE BX  08/23/2012   Procedure: PARTIAL MASTECTOMY WITH NEEDLE LOCALIZATION AND AXILLARY SENTINEL LYMPH NODE BX;  Surgeon: Adin Hector, MD;  Location: [facility];  Service: General;  Laterality:  Left;  blue dye injection   . PORTACATH PLACEMENT  08/23/2012   Procedure: INSERTION PORT-A-CATH;  Surgeon: Adin Hector, MD;  Location: [facility];  Service: General;  Laterality: Right;  intraop ultrasound     OB History   No obstetric history on file.     Family History  Problem Relation Age of Onset  . Heart attack Father   . Diabetes Father   . Hypertension Mother   . Diabetes Mother   . Glaucoma Mother   . Hypertension Brother   . Asthma Brother   . Cancer Paternal Grandmother   . Colon cancer Neg Hx   . Esophageal cancer Neg Hx   . Rectal cancer Neg Hx   . Stomach cancer Neg Hx     Social History     Tobacco Use  . Smoking status: Never Smoker  . Smokeless tobacco: Never Used  . Tobacco comment: NEVER SMOKED  Substance Use Topics  . Alcohol use: No    Alcohol/week: 0.0 standard drinks  . Drug use: No    Home Medications Prior to Admission medications   Medication Sig Start Date End Date Taking? Authorizing Provider  acetaminophen (TYLENOL) 500 MG tablet Take 500 mg by mouth every 6 (six) hours as needed for moderate pain.    [provider]  furosemide (LASIX) 20 MG tablet Take 1 tablet (20 mg total) by mouth daily. 04/02/19 08/04/19  Richardo Priest, MD  magnesium chloride (SLOW-MAG) 64 MG TBEC SR tablet Take 1 tablet (64 mg total) by mouth 2 (two) times daily. 06/20/19   Loel Dubonnet, NP  metoprolol succinate (TOPROL-XL) 50 MG 24 hr tablet Take 1 tablet (50 mg total) by mouth daily. Take with or immediately following a meal. 06/25/19 09/23/19  Wendling, Crosby Oyster, DO  potassium chloride SA (K-DUR) 20 MEQ tablet Take 1 tablet (20 mEq total) by mouth 2 (two) times daily. 04/02/19   Richardo Priest, MD  sacubitril-valsartan (ENTRESTO) 49-51 MG Take 1 tablet by mouth 2 (two) times daily. 08/14/19   Loel Dubonnet, NP  spironolactone (ALDACTONE) 25 MG tablet Take 1 tablet (25 mg total) by mouth daily. 04/02/19 08/04/19  Richardo Priest, MD  spironolactone (ALDACTONE) 25 MG tablet Take 25 mg by mouth daily.    [provider]    Allergies    Sulfamethoxazole-trimethoprim and Adhesive [tape]  Review of Systems   Review of Systems  All other systems reviewed and are negative.   Physical Exam Updated Vital Signs BP 119/70 (BP Location: Right Arm)   Pulse 95   Temp 98 F (36.7 C) (Oral)   Resp 20   Ht 5\' 6"  (1.676 m)   Wt 106.6 kg   LMP 08/18/2014 (LMP Unknown) Comment: menopausal: chemo induced  SpO2 98%   BMI 37.93 kg/m   Physical Exam Vitals and nursing note reviewed.  Constitutional:      General: She is not in acute distress.     Appearance: She is well-developed.  HENT:     Head: Normocephalic and atraumatic.  Eyes:     Conjunctiva/sclera: Conjunctivae normal.  Cardiovascular:     Rate and Rhythm: Normal rate and regular rhythm.     Heart sounds: No murmur.     Comments: Intact distal pulses Pulmonary:     Effort: Pulmonary effort is normal. No respiratory distress.     Breath sounds: Normal breath sounds.     Comments: Lungs are clear bilaterally Abdominal:  Palpations: Abdomen is soft.     Tenderness: There is no abdominal tenderness.  Musculoskeletal:     Cervical back: Neck supple.     Right lower leg: Edema present.     Left lower leg: Edema present.  Skin:    General: Skin is warm and dry.     Comments: Mild maculopapular rash on extremities, chest, and back  Neurological:     Mental Status: She is alert.  Psychiatric:        Mood and Affect: Mood normal.        Behavior: Behavior normal.     ED Results / Procedures / Treatments   Labs (all labs ordered are listed, but only abnormal results are displayed) Labs Reviewed  CBC WITH DIFFERENTIAL/PLATELET - Abnormal; Notable for the following components:      Result Value   HCT 47.4 (*)    All other components within normal limits  BASIC METABOLIC PANEL - Abnormal; Notable for the following components:   Potassium 3.4 (*)    CO2 19 (*)    Glucose, Bld 111 (*)    BUN 27 (*)    Creatinine, Ser 1.66 (*)    GFR calc non Af Amer 36 (*)    GFR calc Af Amer 42 (*)    All other components within normal limits  BRAIN NATRIURETIC PEPTIDE - Abnormal; Notable for the following components:   B Natriuretic Peptide 301.6 (*)    All other components within normal limits  I-STAT BETA HCG BLOOD, ED ([facility], [facility], AP ONLY)    EKG EKG Interpretation  Date/Time:  Tuesday October 14 2019 01:09:12 EST Ventricular Rate:  95 PR Interval:  180 QRS Duration: 168 QT Interval:  418 QTC Calculation: 553 R Axis:   52 Text Interpretation: Sinus rhythm  Ventricular bigeminy LAE, consider biatrial enlargement Nonspecific intraventricular conduction delay Repol abnrm, severe global ischemia (LM/MVD) Confirmed by Veryl Speak [phone]) on 10/14/2019 1:26:31 AM   Radiology DG Chest 2 View  Result Date: 10/13/2019 CLINICAL DATA:  Shortness of breath. EXAM: CHEST - 2 VIEW COMPARISON:  Chest x-rays dated 07/21/2019 and 08/15/2012 FINDINGS: There is cardiomegaly. Pulmonary vascularity is normal. Lungs are clear. No effusions. No bone abnormality. IMPRESSION: No acute abnormalities. Cardiomegaly appears slightly more prominent than on the prior exam. Electronically Signed   By: Lorriane Shire M.D.   On: 10/13/2019 15:34    Procedures Ultrasound ED Peripheral IV (Provider)  Date/Time: 10/14/2019 4:16 AM Performed by: Montine Circle, PA-C Authorized by: Montine Circle, PA-C   Procedure details:    Indications: multiple failed IV attempts     Skin Prep: chlorhexidine gluconate     Location:  Right AC   Angiocath:  18 G   Bedside Ultrasound Guided: Yes     Images: not archived     Patient tolerated procedure without complications: Yes     Dressing applied: Yes     (including critical care time)  Medications Ordered in ED Medications - No data to display  ED Course  I have reviewed the triage vital signs and the nursing notes.  Pertinent labs & imaging results that were available during my care of the patient were reviewed by me and considered in my medical decision making (see chart for details).    MDM Rules/Calculators/A&P                      Patient with history of CHF and liver cancer.  Here with worsening shortness of  breath.  She also reports worsening swelling on bilateral lower extremities.  She also reports having a new rash that is developed, but without any medication changes, or exposures to chemical irritants.  She is noted to be afebrile.  Her O2 saturation is 98% on room air.  She is not tachycardic.  She does have 1-2+  pitting edema on bilateral lower extremities.  Creatinine is 1.66, will give small dose of Lasix in an attempt to diurese in the emergency department and prevent admission.  X-ray shows cardiomegaly, but no significant vascular congestion or effusion.  BNP is noted 301.6.  Potassium is 3.4.  Creatinine is 1.66.  5:49 AM Patient reports feeling significantly improved after diuresis in the ED.  She ambulates maintaining 100% O2 saturation.  She no longer feels short of breath.  Will discharge home with instructions to continue her regular medications.  Will try Zyrtec for rash.  Unclear etiology, but afebrile and nontoxic appearing.  Can follow-up with PCP.   Final Clinical Impression(s) / ED Diagnoses Final diagnoses:  Acute on chronic congestive heart failure, unspecified heart failure type ([place])  Rash    Rx / DC Orders ED Discharge Orders         Ordered    cetirizine (ZYRTEC ALLERGY) 10 MG tablet  Daily     10/14/19 0618           Montine Circle, PA-C 10/14/19 2919    Veryl Speak, MD 10/14/19 [phone]

## 2019-10-14 NOTE — Discharge Instructions (Addendum)
Continue your regular medications.  You can take Zyrtec for rash.  No clear explanation for your rash was found.    Please return to the ER if your symptoms worsen.

## 2019-10-14 NOTE — ED Notes (Signed)
Pt. Was up for discharge. Took final vitals and took IV out of Pt. While taking the IV out the Pt. Stated that she was really ready to go home. I asked the tech to take another set of vital on Pt.and that I would discharge when I got the chance.   I then went to another room to hang blood. After hanging blood the Pt. Was gone and no one else gave her discharge instructions and pt. Left on her own.

## 2019-10-14 NOTE — ED Notes (Signed)
Pt. was ambulated and stayed at 100 the entire time. Pt. States no SOB.

## 2019-10-16 ENCOUNTER — Ambulatory Visit (INDEPENDENT_AMBULATORY_CARE_PROVIDER_SITE_OTHER): Payer: Medicare HMO | Admitting: Cardiology

## 2019-10-16 ENCOUNTER — Other Ambulatory Visit: Payer: Self-pay

## 2019-10-16 ENCOUNTER — Encounter: Payer: Self-pay | Admitting: Cardiology

## 2019-10-16 VITALS — BP 102/84 | HR 84 | Ht 66.0 in | Wt 238.0 lb

## 2019-10-16 DIAGNOSIS — I110 Hypertensive heart disease with heart failure: Secondary | ICD-10-CM | POA: Diagnosis not present

## 2019-10-16 DIAGNOSIS — I5042 Chronic combined systolic (congestive) and diastolic (congestive) heart failure: Secondary | ICD-10-CM | POA: Diagnosis not present

## 2019-10-16 DIAGNOSIS — I493 Ventricular premature depolarization: Secondary | ICD-10-CM | POA: Diagnosis not present

## 2019-10-16 DIAGNOSIS — I420 Dilated cardiomyopathy: Secondary | ICD-10-CM

## 2019-10-16 DIAGNOSIS — E876 Hypokalemia: Secondary | ICD-10-CM

## 2019-10-16 MED ORDER — FUROSEMIDE 40 MG PO TABS: 40.0000 mg | ORAL_TABLET | Freq: Every day | ORAL | 3 refills | Status: DC

## 2019-10-16 MED ORDER — POTASSIUM CHLORIDE CRYS ER 20 MEQ PO TBCR: 40.0000 meq | EXTENDED_RELEASE_TABLET | Freq: Two times a day (BID) | ORAL | 3 refills | Status: DC

## 2019-10-16 MED FILL — FUROSEMIDE 40 MG TAB: 40 | 90 days supply | Qty: 90 | Fill #0

## 2019-10-16 MED FILL — POTASSIUM CHLORIDE CRYS ER: 20 | 23 days supply | Qty: 90 | Fill #0

## 2019-10-16 NOTE — Patient Instructions (Addendum)
Medication Instructions:  Your physician has recommended you make the following change in your medication:   INCREASE: Furosemide to 40 mg Take 1 tab twice daily INCREASE: Potassium to 20 meq 2 tabs twice daily  *If you need a refill on your cardiac medications before your next appointment, please call your pharmacy*  Lab Work: None If you have labs (blood work) drawn today and your tests are completely normal, you will receive your results only by: Marland Kitchen MyChart Message (if you have MyChart) OR . A paper copy in the mail If you have any lab test that is abnormal or we need to change your treatment, we will call you to review the results.  Testing/Procedures: None  Follow-Up: At [facility], you and your health needs are our priority.  As part of our continuing mission to provide you with exceptional heart care, we have created designated Provider Care Teams.  These Care Teams include your primary Cardiologist (physician) and Advanced Practice Providers (APPs -  Physician Assistants and Nurse Practitioners) who all work together to provide you with the care you need, when you need it.  Your next appointment:   1 week(s)  The format for your next appointment:   In Person  Provider:   Shirlee More, MD  Other Instructions WEIGH DAILY

## 2019-10-16 NOTE — Progress Notes (Signed)
Cardiology Office Note:    Date:  10/16/2019   ID:  Amber Bautista, DOB January 24, 1971, MRN [id number]  PCP:  Shelda Pal, DO  Cardiologist:  Shirlee More, MD    Referring MD: Shelda Pal*    ASSESSMENT:    1. Dilated cardiomyopathy ([place])   2. Chronic combined systolic and diastolic heart failure ([place])   3. Hypertensive heart disease with heart failure ([place])   4. PVC's (premature ventricular contractions)   5. Hypokalemia   6. Hypomagnesemia    PLAN:    In order of problems listed above:  1. Her current medical regimen consist of small dose of loop diuretic MRA beta-blocker in mid range Entresto.  Has been seen by electrophysiology at this time does not require ICD or antiarrhythmic drug therapy.  She presents today with decompensated heart failure despite the ED visit we will put her on an adequate diuretic furosemide 40 twice daily see back in the office 1 week check renal function and proBNP level consider up titration of Entresto and SGLT2 inhibitor 2. Stable blood pressure continue guideline directed treatment MRA beta-blocker Entresto 3. To new PVCs at this time does not need an ICD and has had EP evaluation 4. Increase supplement potassium recheck BMP magnesium next week   Next appointment: 1 week   Medication Adjustments/Labs and Tests Ordered: Current medicines are reviewed at length with the patient today.  Concerns regarding medicines are outlined above.  No orders of the defined types were placed in this encounter.  No orders of the defined types were placed in this encounter.   No chief complaint on file.   History of Present Illness:    Amber Bautista is a 49 y.o. female with a hx of heart failure, nonischemic cardiomyopathy, PVCs, HTN, metastatic carcinoid with liver metastasis, stage IIb ductal carcinoma of L breast  last seen 05/20/2019. She was seen at [facility] ED 04/14/2019 with syncope.Her EKG showed [place] with LBBB. Echo 08/2012 LVEF  25-30%, mild LA enlargement, mild MR, severe LV dilation. Seen by cardiology at [facility] underwent heart cath showing "very weak heart muscle and normal coronary arteries".  7 day Zio 05/02/19 showed SR with frequent (7.4%) ventricular ectopy.  She was admitted to the hospital October 2020.  She was admitted for multiple reasons, but had had an episode of syncope.  It was thought that the syncope was vagal in nature.  She was noted to have high burden of PVCs while in the hospital.  It was thought that her PVCs were not contributing to her heart failure as her ejection fraction had actually improved to 45% from 25 to 30% previously.   Compliance with diet, lifestyle and medications: Yes but she does not weigh in over the holidays she was eating outside of her home  Echo 05/28/2019:  1. Mild hypokinesis of the left ventricular, entire septal wall.  2. The left ventricle has mildly reduced systolic function, with an ejection fraction of 45-50%. The cavity size was normal. There is moderately increased left ventricular wall thickness. Left ventricular diastolic Doppler parameters are consistent with  impaired relaxation.  3. The right ventricle has normal systolic function. The cavity was normal. There is no increase in right ventricular wall thickness.  4. Right atrial size was mildly dilated.  5. Tricuspid valve regurgitation is moderate-severe.  6. Mild pulmonic stenosis.  7. The aorta is normal unless otherwise noted.  8. The inferior vena cava was normal in size with <50% respiratory  variability.  9. TDS, cinsider MRI for better assesment of RV and TR  Study Highlights  A ZIO monitor was used for 7 days beginning 04/07/2019 to assess ventricular ectopy in the setting of dilated cardiomyopathy.  The rhythm was sinus throughout the recording with minimum average and maximum heart rates of 58, 93 and 131 bpm.  There were no pauses of 3 seconds or greater.  There were no  triggered or diary events.  Ventricular ectopy was frequent 7.4% incidence of isolated PVCs less than 1% incidence of couplets and triplets.  The longest episode of bigeminy 23 minutes 28 seconds in the longest episode of trigeminy 1 minute 26 seconds.  Conclusion frequent ventricular ectopy   Ref Range & Units 3 d ago 2 mo ago   B Natriuretic Peptide 0.0 - 100.0 pg/mL 301.6High   69.8 CM     2 mo ago  (07/23/19) 2 mo ago  (07/21/19) 2 mo ago  (07/21/19) 3 mo ago  (06/19/19)   Magnesium 1.7 - 2.4 mg/dL 1.9  2.0 CM  1.5Low  CM  1.6    She is seen in the emergency room [facility] 10/14/2019 with decompensated heart failure given IV Lasix and discharged.  At that time her BNP level is 301 potassium 3.4 creatinine 1.66.  Chest x-ray showed cardiomegaly but no overt heart failure KG showed left bundle branch block sinus rhythm ventricular bigeminy.  He is improved but still has edema and shortness of breath with activity and in retrospect her weight is up 8 to 10 pounds with some dietary indiscretion over the holidays.  She does not weigh daily.  She is seen today and is edematous we will increase her diuretic and see her back in the office in 1 week and consider up titration of Entresto and addition of SGLT2 inhibitor at that time.  No chest pain or syncope. Past Medical History:  Diagnosis Date  . Abdominal pain 12/17/2014  . Breast cancer ([place]) 07/12/12   Stage IIB Ductal Carcinoma to  Upper Outer Quadrant: Left Breast: Triple Negative  . Cancer ([place]) 03/03/2015   Overview:  in intestine-2007, in liver-2007, and left breast-2013  . Cancer of upper-outer quadrant of female breast ([place]) 08/01/2012   Image guided biopsy Oct., 2013 -    2 cm by MRI, solitary;     IDC; TNBC; positive LVI;      . Carcinoid tumor of intestine 2001   Mets 2007  . Carcinoma of breast treated with adjuvant chemotherapy ([place])    4 cycles of Carboplatin / Taxol  . Chronic congestive heart failure ([place])  03/17/2019  . Congestive heart failure (CHF) ([place])   . Diarrhea    Sandostatin  . Frequent PVCs 04/01/2019  . Goals of care, counseling/discussion 03/31/2019  . Hypertension   . Metastatic carcinoid tumor ([place])   . Morbidly obese ([place])   . S/P radiation therapy 02/04/2013-02/21/2013   Left Breast and Axilla / 46.8 Gy in 26 fractions were planned (she only received 18 Gy in 10 fractions)  . Syncope 07/2019    Past Surgical History:  Procedure Laterality Date  . BREAST SURGERY  08/23/12   Lt br lumpectomy  . [place] 2007  . COLON SURGERY  2001   Cancer/ Intestinal Resection  . OVARIAN CYST REMOVAL  2001  . PARTIAL MASTECTOMY WITH NEEDLE LOCALIZATION AND AXILLARY SENTINEL LYMPH NODE BX  08/23/2012   Procedure: PARTIAL MASTECTOMY WITH NEEDLE LOCALIZATION AND AXILLARY SENTINEL LYMPH NODE  BX;  Surgeon: Adin Hector, MD;  Location: [facility];  Service: General;  Laterality: Left;  blue dye injection   . PORTACATH PLACEMENT  08/23/2012   Procedure: INSERTION PORT-A-CATH;  Surgeon: Adin Hector, MD;  Location: [facility];  Service: General;  Laterality: Right;  intraop ultrasound    Current Medications: No outpatient medications have been marked as taking for the 10/16/19 encounter (Appointment) with Richardo Priest, MD.     Allergies:   Sulfamethoxazole-trimethoprim and Adhesive [tape]   Social History   Socioeconomic History  . Marital status: Divorced    Spouse name: Not on file  . Number of children: Not on file  . Years of education: Not on file  . Highest education level: Not on file  Occupational History  . Not on file  Tobacco Use  . Smoking status: Never Smoker  . Smokeless tobacco: Never Used  . Tobacco comment: NEVER SMOKED  Substance and Sexual Activity  . Alcohol use: No    Alcohol/week: 0.0 standard drinks  . Drug use: No  . Sexual activity: Not Currently    Comment: menarche age 28, P 3, G 1, 1 misscarriage  Other Topics Concern  . Not on file    Social History Narrative  . Not on file   Social Determinants of Health   Financial Resource Strain:   . Difficulty of Paying Living Expenses: Not on file  Food Insecurity:   . Worried About Charity fundraiser in the Last Year: Not on file  . Ran Out of Food in the Last Year: Not on file  Transportation Needs:   . Lack of Transportation (Medical): Not on file  . Lack of Transportation (Non-Medical): Not on file  Physical Activity:   . Days of Exercise per Week: Not on file  . Minutes of Exercise per Session: Not on file  Stress:   . Feeling of Stress : Not on file  Social Connections:   . Frequency of Communication with Friends and Family: Not on file  . Frequency of Social Gatherings with Friends and Family: Not on file  . Attends Religious Services: Not on file  . Active Member of Clubs or Organizations: Not on file  . Attends Archivist Meetings: Not on file  . Marital Status: Not on file     Family History: The patient's family history includes Asthma in her brother; Cancer in her paternal grandmother; Diabetes in her father and mother; Glaucoma in her mother; Heart attack in her father; Hypertension in her brother and mother. There is no history of Colon cancer, Esophageal cancer, Rectal cancer, or Stomach cancer. ROS:   Please see the history of present illness.    All other systems reviewed and are negative.  EKGs/Labs/Other Studies Reviewed:    The following studies were reviewed today:  Recent Labs: 05/20/2019: NT-Pro BNP 1,071 07/23/2019: Magnesium 1.9 08/29/2019: ALT 17 10/13/2019: B Natriuretic Peptide 301.6; BUN 27; Creatinine, Ser 1.66; Hemoglobin 14.3; Platelets 312; Potassium 3.4; Sodium 138  Recent Lipid Panel No results found for: CHOL, TRIG, HDL, CHOLHDL, VLDL, LDLCALC, LDLDIRECT  Physical Exam:    VS:  LMP 08/18/2014 (LMP Unknown) Comment: menopausal: chemo induced    Wt Readings from Last 3 Encounters:  10/13/19 235 lb (106.6 kg)   08/29/19 237 lb (107.5 kg)  08/04/19 226 lb 1.9 oz (102.6 kg)     GEN:  Well nourished, well developed in no acute distress HEENT: Normal NECK:  No carotid bruits LYMPHATICS:  No lymphadenopathy CARDIAC: S3 present mild neck vein distention 2-3+ edema to the knee bilateral RRR, no murmurs,, gallops RESPIRATORY:  Clear to auscultation without rales, wheezing or rhonchi  ABDOMEN: Soft, non-tender, non-distended MUSCULOSKELETAL:  ; No deformity  SKIN: Warm and dry NEUROLOGIC:  Alert and oriented x 3 PSYCHIATRIC:  Normal affect    Signed, Shirlee More, MD  10/16/2019 1:14 PM    [place]

## 2019-10-17 ENCOUNTER — Encounter: Payer: Self-pay | Admitting: Hematology & Oncology

## 2019-10-21 NOTE — Progress Notes (Signed)
Cardiology Office Note:    Date:  10/22/2019   ID:  Amber Bautista, DOB 08-15-1971, MRN [id number]  PCP:  Jolinda Croak, MD  Cardiologist:  Shirlee More, MD    Referring MD: Jolinda Croak, MD    ASSESSMENT:    1. Chronic combined systolic and diastolic heart failure ([place])   2. PVC's (premature ventricular contractions)   3. Hypertensive heart disease with heart failure ([place])   4. Hypokalemia    PLAN:    In order of problems listed above:  1. Improved she is no longer fluid overload continue her current loop diuretic she monitors her weight at home controlled lab work done this afternoon at oncology including a renal profile to be sent to me.  Her blood pressure is borderline low I am not can uptitrate Entresto or add SGLT2 inhibitor at this time. 2. Stable continue her beta-blocker has been evaluated by electrophysiology not advised an ICD at this time 3. Continue guideline directed therapy with loop diuretic MRA beta-blocker and Entresto mid range set her up titration at next visit 4. Continue potassium she will have a repeat renal profile today with oncology   Next appointment: 1 month   Medication Adjustments/Labs and Tests Ordered: Current medicines are reviewed at length with the patient today.  Concerns regarding medicines are outlined above.  No orders of the defined types were placed in this encounter.  No orders of the defined types were placed in this encounter.   No chief complaint on file.   History of Present Illness:    Amber Bautista is a 49 y.o. 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  Her last echocardiogram 19 2020 showed improvement EF of 45 to 50%.  ZIO monitor was performed 04/07/2019 which showed frequent PVCs 7.4% less than 1% incidence of couplets and triplets.  She has been seen in consultation by electrophysiology.  She was last seen 10/16/2019 with  decompensated heart failure and fluid overload with intensification of her diuretic therapy. Compliance with diet, lifestyle and medications: Yes Seen 1 week ago decompensated heart failure fluid overloaded weight is down 5 time pounds edema cleared less short of breath and orthopnea PND. Past Medical History:  Diagnosis Date  . Abdominal pain 12/17/2014  . Breast cancer ([place]) 07/12/12   Stage IIB Ductal Carcinoma to  Upper Outer Quadrant: Left Breast: Triple Negative  . Cancer ([place]) 03/03/2015   Overview:  in intestine-2007, in liver-2007, and left breast-2013  . Cancer of upper-outer quadrant of female breast ([place]) 08/01/2012   Image guided biopsy Oct., 2013 -    2 cm by MRI, solitary;     IDC; TNBC; positive LVI;      . Carcinoid tumor of intestine 2001   [place] 2007  . Carcinoma of breast treated with adjuvant chemotherapy ([place])    4 cycles of Carboplatin / Taxol  . Chronic congestive heart failure ([place]) 03/17/2019  . Congestive heart failure (CHF) ([place])   . Diarrhea    Sandostatin  . Frequent PVCs 04/01/2019  . Goals of care, counseling/discussion 03/31/2019  . Hypertension   . Metastatic carcinoid tumor ([place])   . Morbidly obese ([place])   . S/P radiation therapy 02/04/2013-02/21/2013   Left Breast and Axilla / 46.8 Gy in 26 fractions were planned (she only received 18 Gy in 10 fractions)  . Syncope 07/2019    Past Surgical History:  Procedure Laterality  Date  . BREAST SURGERY  08/23/12   Lt br lumpectomy  . [place] 2007  . COLON SURGERY  2001   Cancer/ Intestinal Resection  . OVARIAN CYST REMOVAL  2001  . PARTIAL MASTECTOMY WITH NEEDLE LOCALIZATION AND AXILLARY SENTINEL LYMPH NODE BX  08/23/2012   Procedure: PARTIAL MASTECTOMY WITH NEEDLE LOCALIZATION AND AXILLARY SENTINEL LYMPH NODE BX;  Surgeon: Adin Hector, MD;  Location: [facility];  Service: General;  Laterality: Left;  blue dye injection   . PORTACATH PLACEMENT  08/23/2012   Procedure: INSERTION PORT-A-CATH;   Surgeon: Adin Hector, MD;  Location: [facility];  Service: General;  Laterality: Right;  intraop ultrasound    Current Medications: Current Meds  Medication Sig  . cetirizine (ZYRTEC ALLERGY) 10 MG tablet Take 1 tablet (10 mg total) by mouth daily.  . furosemide (LASIX) 40 MG tablet Take 1 tablet (40 mg total) by mouth daily.  . magnesium chloride (SLOW-MAG) 64 MG TBEC SR tablet Take 1 tablet (64 mg total) by mouth 2 (two) times daily. (Patient taking differently: Take 1 tablet by mouth daily. )  . metoprolol succinate (TOPROL-XL) 50 MG 24 hr tablet Take 50 mg by mouth daily. Take with or immediately following a meal.  . potassium chloride SA (KLOR-CON) 20 MEQ tablet Take 2 tablets (40 mEq total) by mouth 2 (two) times daily.  . sacubitril-valsartan (ENTRESTO) 49-51 MG Take 1 tablet by mouth 2 (two) times daily.  Marland Kitchen spironolactone (ALDACTONE) 25 MG tablet Take 25 mg by mouth daily.     Allergies:   Sulfamethoxazole-trimethoprim and Adhesive [tape]   Social History   Socioeconomic History  . Marital status: Divorced    Spouse name: Not on file  . Number of children: Not on file  . Years of education: Not on file  . Highest education level: Not on file  Occupational History  . Not on file  Tobacco Use  . Smoking status: Never Smoker  . Smokeless tobacco: Never Used  . Tobacco comment: NEVER SMOKED  Substance and Sexual Activity  . Alcohol use: No    Alcohol/week: 0.0 standard drinks  . Drug use: No  . Sexual activity: Not Currently    Comment: menarche age 88, P 3, G 1, 1 misscarriage  Other Topics Concern  . Not on file  Social History Narrative  . Not on file   Social Determinants of Health   Financial Resource Strain:   . Difficulty of Paying Living Expenses: Not on file  Food Insecurity:   . Worried About Charity fundraiser in the Last Year: Not on file  . Ran Out of Food in the Last Year: Not on file  Transportation Needs:   . Lack of Transportation (Medical): Not  on file  . Lack of Transportation (Non-Medical): Not on file  Physical Activity:   . Days of Exercise per Week: Not on file  . Minutes of Exercise per Session: Not on file  Stress:   . Feeling of Stress : Not on file  Social Connections:   . Frequency of Communication with Friends and Family: Not on file  . Frequency of Social Gatherings with Friends and Family: Not on file  . Attends Religious Services: Not on file  . Active Member of Clubs or Organizations: Not on file  . Attends Archivist Meetings: Not on file  . Marital Status: Not on file     Family History: The patient's family history includes Asthma in  her brother; Cancer in her paternal grandmother; Diabetes in her father and mother; Glaucoma in her mother; Heart attack in her father; Hypertension in her brother and mother. There is no history of Colon cancer, Esophageal cancer, Rectal cancer, or Stomach cancer. ROS:   Please see the history of present illness.    All other systems reviewed and are negative.  EKGs/Labs/Other Studies Reviewed:    The following studies were reviewed today:  EKG:  EKG ordered today and personally reviewed.  The ekg ordered today demonstrates sinus rhythm left bundle branch block ventricular bigeminy  Recent Labs: 05/20/2019: NT-Pro BNP 1,071 07/23/2019: Magnesium 1.9 08/29/2019: ALT 17 10/13/2019: B Natriuretic Peptide 301.6; BUN 27; Creatinine, Ser 1.66; Hemoglobin 14.3; Platelets 312; Potassium 3.4; Sodium 138  Recent Lipid Panel No results found for: CHOL, TRIG, HDL, CHOLHDL, VLDL, LDLCALC, LDLDIRECT  Physical Exam:    VS:  BP 102/80   Pulse 95   Ht 5\' 6"  (1.676 m)   Wt 233 lb (105.7 kg)   LMP 08/18/2014 (LMP Unknown) Comment: menopausal: chemo induced  SpO2 98%   BMI 37.61 kg/m     Wt Readings from Last 3 Encounters:  10/22/19 233 lb (105.7 kg)  10/16/19 238 lb (108 kg)  10/13/19 235 lb (106.6 kg)     GEN:  Well nourished, well developed in no acute  distress HEENT: Normal NECK: No JVD; No carotid bruits LYMPHATICS: No lymphadenopathy CARDIAC: 2 paradoxical RRR, no murmurs, rubs, gallops RESPIRATORY:  Clear to auscultation without rales, wheezing or rhonchi  ABDOMEN: Soft, non-tender, non-distended MUSCULOSKELETAL:  No edema; No deformity  SKIN: Warm and dry NEUROLOGIC:  Alert and oriented x 3 PSYCHIATRIC:  Normal affect    Signed, Shirlee More, MD  10/22/2019 1:40 PM    [facility]

## 2019-10-22 ENCOUNTER — Encounter: Payer: Self-pay | Admitting: Cardiology

## 2019-10-22 ENCOUNTER — Inpatient Hospital Stay: Payer: Medicare HMO | Attending: Hematology & Oncology

## 2019-10-22 ENCOUNTER — Encounter: Payer: Self-pay | Admitting: Hematology & Oncology

## 2019-10-22 ENCOUNTER — Inpatient Hospital Stay (HOSPITAL_BASED_OUTPATIENT_CLINIC_OR_DEPARTMENT_OTHER): Payer: Medicare HMO | Admitting: Hematology & Oncology

## 2019-10-22 ENCOUNTER — Other Ambulatory Visit: Payer: Self-pay

## 2019-10-22 ENCOUNTER — Inpatient Hospital Stay: Payer: Medicare HMO

## 2019-10-22 ENCOUNTER — Ambulatory Visit (INDEPENDENT_AMBULATORY_CARE_PROVIDER_SITE_OTHER): Payer: Medicare HMO | Admitting: Cardiology

## 2019-10-22 VITALS — BP 102/80 | HR 95 | Ht 66.0 in | Wt 233.0 lb

## 2019-10-22 VITALS — BP 110/65 | HR 94 | Temp 96.8°F | Resp 20 | Wt 231.8 lb

## 2019-10-22 DIAGNOSIS — Z79899 Other long term (current) drug therapy: Secondary | ICD-10-CM | POA: Diagnosis not present

## 2019-10-22 DIAGNOSIS — N6320 Unspecified lump in the left breast, unspecified quadrant: Secondary | ICD-10-CM

## 2019-10-22 DIAGNOSIS — Z9221 Personal history of antineoplastic chemotherapy: Secondary | ICD-10-CM | POA: Insufficient documentation

## 2019-10-22 DIAGNOSIS — I5042 Chronic combined systolic (congestive) and diastolic (congestive) heart failure: Secondary | ICD-10-CM | POA: Diagnosis not present

## 2019-10-22 DIAGNOSIS — Z171 Estrogen receptor negative status [ER-]: Secondary | ICD-10-CM | POA: Insufficient documentation

## 2019-10-22 DIAGNOSIS — I509 Heart failure, unspecified: Secondary | ICD-10-CM | POA: Diagnosis not present

## 2019-10-22 DIAGNOSIS — C50411 Malignant neoplasm of upper-outer quadrant of right female breast: Secondary | ICD-10-CM

## 2019-10-22 DIAGNOSIS — E876 Hypokalemia: Secondary | ICD-10-CM | POA: Diagnosis not present

## 2019-10-22 DIAGNOSIS — C7B02 Secondary carcinoid tumors of liver: Secondary | ICD-10-CM | POA: Diagnosis not present

## 2019-10-22 DIAGNOSIS — C50012 Malignant neoplasm of nipple and areola, left female breast: Secondary | ICD-10-CM

## 2019-10-22 DIAGNOSIS — C7A019 Malignant carcinoid tumor of the small intestine, unspecified portion: Secondary | ICD-10-CM | POA: Diagnosis present

## 2019-10-22 DIAGNOSIS — I110 Hypertensive heart disease with heart failure: Secondary | ICD-10-CM

## 2019-10-22 DIAGNOSIS — Z853 Personal history of malignant neoplasm of breast: Secondary | ICD-10-CM | POA: Insufficient documentation

## 2019-10-22 DIAGNOSIS — D3A098 Benign carcinoid tumors of other sites: Secondary | ICD-10-CM | POA: Diagnosis not present

## 2019-10-22 DIAGNOSIS — I493 Ventricular premature depolarization: Secondary | ICD-10-CM | POA: Diagnosis not present

## 2019-10-22 LAB — CMP (CANCER CENTER ONLY)
ALT: 9 U/L (ref 0–44)
AST: 10 U/L — ABNORMAL LOW (ref 15–41)
Albumin: 3.7 g/dL (ref 3.5–5.0)
Alkaline Phosphatase: 60 U/L (ref 38–126)
Anion gap: 14 (ref 5–15)
BUN: 30 mg/dL — ABNORMAL HIGH (ref 6–20)
CO2: 18 mmol/L — ABNORMAL LOW (ref 22–32)
Calcium: 9.4 mg/dL (ref 8.9–10.3)
Chloride: 106 mmol/L (ref 98–111)
Creatinine: 2.1 mg/dL — ABNORMAL HIGH (ref 0.44–1.00)
GFR, Est AFR Am: 31 mL/min — ABNORMAL LOW (ref >60)
GFR, Estimated: 27 mL/min — ABNORMAL LOW (ref >60)
Glucose, Bld: 122 mg/dL — ABNORMAL HIGH (ref 70–99)
Potassium: 3.1 mmol/L — ABNORMAL LOW (ref 3.5–5.1)
Sodium: 138 mmol/L (ref 135–145)
Total Bilirubin: 0.9 mg/dL (ref 0.3–1.2)
Total Protein: 6.6 g/dL (ref 6.5–8.1)

## 2019-10-22 LAB — CBC WITH DIFFERENTIAL (CANCER CENTER ONLY)
Abs Immature Granulocytes: 0.01 10*3/uL (ref 0.00–0.07)
Basophils Absolute: 0.1 10*3/uL (ref 0.0–0.1)
Basophils Relative: 1 %
Eosinophils Absolute: 0.9 10*3/uL — ABNORMAL HIGH (ref 0.0–0.5)
Eosinophils Relative: 13 %
HCT: 46.1 % — ABNORMAL HIGH (ref 36.0–46.0)
Hemoglobin: 14.7 g/dL (ref 12.0–15.0)
Immature Granulocytes: 0 %
Lymphocytes Relative: 30 %
Lymphs Abs: 2.1 10*3/uL (ref 0.7–4.0)
MCH: 28.5 pg (ref 26.0–34.0)
MCHC: 31.9 g/dL (ref 30.0–36.0)
MCV: 89.3 fL (ref 80.0–100.0)
Monocytes Absolute: 0.1 10*3/uL (ref 0.1–1.0)
Monocytes Relative: 2 %
Neutro Abs: 3.8 10*3/uL (ref 1.7–7.7)
Neutrophils Relative %: 54 %
Platelet Count: 319 10*3/uL (ref 150–400)
RBC: 5.16 MIL/uL — ABNORMAL HIGH (ref 3.87–5.11)
RDW: 15.5 % (ref 11.5–15.5)
WBC Count: 7 10*3/uL (ref 4.0–10.5)
nRBC: 0 % (ref 0.0–0.2)

## 2019-10-22 MED ORDER — LANREOTIDE ACETATE 120 MG/0.5ML ~~LOC~~ SOLN
120.0000 mg | Freq: Once | SUBCUTANEOUS | Status: AC
  Administered 2019-10-22: 120 mg via SUBCUTANEOUS

## 2019-10-22 MED ORDER — LANREOTIDE ACETATE 120 MG/0.5ML ~~LOC~~ SOLN
SUBCUTANEOUS | Status: AC
  Filled 2019-10-22: qty 120

## 2019-10-22 NOTE — Progress Notes (Signed)
Hematology and Oncology Follow Up Visit  Amber Bautista [id number] [birth year] 49 y.o. 10/22/2019   Principle Diagnosis:   Metastatic carcinoid --progressive with liver metastasis  Stage IIb (T2N1M0) ductal carcinoma of the left breast  -- Triple negative  Congestive heart failure  Current Therapy:    Somatuline 120 mg IM monthly -- re-started on 08/29/2019     Interim History:  Ms. Amber Bautista is back for follow-up.  Surprisingly, I do think we have a problem now.  For the past month, her left breast has been enlarging.  She is not noted a distinct mass.  She says the whole breast has gotten larger.  Has gotten firmer.  There is no nipple discharge.  Another problem is that she was in the hospital for a day or so with congestive heart failure.  She is followed by cardiology for this.  She started to gain quite a bit of weight.  She was diuresed in the hospital.  They did not do an echocardiogram on her.  We also have the carcinoid to deal with.  Her last Somatuline was 2 months ago.  Her last chromogranin A level was about 3900.  Is actually was better.  I think if we does get her on monthly Somatuline, or even every 2 weeks to match line, we will be able to help the carcinoid.  I am still not sure if insurance is an issue for her.  I know this has been a problem for her in the past.  She has had no fever.  She has had little bit of cough and shortness of breath from the heart failure.    Thankfully, she was feeling well over Thanksgiving and Christmas.  She is trying to watch what she eats.  Currently, her performance status is ECOG 1.   Medications:  Current Outpatient Medications:  .  furosemide (LASIX) 40 MG tablet, Take 1 tablet (40 mg total) by mouth daily., Disp: 90 tablet, Rfl: 3 .  magnesium chloride (SLOW-MAG) 64 MG TBEC SR tablet, Take 1 tablet (64 mg total) by mouth 2 (two) times daily. (Patient taking differently: Take 1 tablet by mouth daily. ), Disp: 120 tablet, Rfl:  0 .  metoprolol succinate (TOPROL-XL) 50 MG 24 hr tablet, Take 50 mg by mouth daily. Take with or immediately following a meal., Disp: , Rfl:  .  potassium chloride SA (KLOR-CON) 20 MEQ tablet, Take 2 tablets (40 mEq total) by mouth 2 (two) times daily., Disp: 90 tablet, Rfl: 3 .  sacubitril-valsartan (ENTRESTO) 49-51 MG, Take 1 tablet by mouth 2 (two) times daily., Disp: 180 tablet, Rfl: 1 .  spironolactone (ALDACTONE) 25 MG tablet, Take 25 mg by mouth daily., Disp: , Rfl:  .  cetirizine (ZYRTEC ALLERGY) 10 MG tablet, Take 1 tablet (10 mg total) by mouth daily. (Patient not taking: Reported on 10/22/2019), Disp: 30 tablet, Rfl: 0  Allergies:  Allergies  Allergen Reactions  . Sulfamethoxazole-Trimethoprim Swelling and Hypertension    Swelling and hypertension after taking, was admitted to hospital  . Adhesive [Tape] Rash and Other (See Comments)    Causes SEVERE RASHES AND BLISTERS    Past Medical History, Surgical history, Social history, and Family History were reviewed and updated.  Review of Systems: Review of Systems  Constitutional: Positive for fatigue.  HENT:  Negative.   Eyes: Negative.   Respiratory: Positive for shortness of breath.   Cardiovascular: Positive for palpitations.  Gastrointestinal: Positive for diarrhea and nausea.  Endocrine: Negative.   Genitourinary:  Negative.    Musculoskeletal: Positive for back pain and myalgias.  Skin: Negative.   Neurological: Positive for light-headedness. Negative for numbness.  Hematological: Negative.   Psychiatric/Behavioral: Negative.     Physical Exam:  weight is 231 lb 12.8 oz (105.1 kg). Her temporal temperature is 96.8 F (36 C) (abnormal). Her blood pressure is 110/65 and her pulse is 94. Her respiration is 20 and oxygen saturation is 97%.   Wt Readings from Last 3 Encounters:  10/22/19 231 lb 12.8 oz (105.1 kg)  10/22/19 233 lb (105.7 kg)  10/16/19 238 lb (108 kg)    Physical Exam Vitals reviewed.   Constitutional:      Comments: Her breast exam shows a swollen left breast.  There is some skin changes.  There is a slight hyperpigmentation of the left breast.  There is firmness of the left breast.  There is no nipple discharge.  I cannot palpate any left axillary adenopathy.  Right breast is relatively unremarkable.  HENT:     Head: Normocephalic and atraumatic.  Eyes:     Pupils: Pupils are equal, round, and reactive to light.  Cardiovascular:     Rate and Rhythm: Normal rate and regular rhythm.     Heart sounds: Normal heart sounds.  Pulmonary:     Effort: Pulmonary effort is normal.     Breath sounds: Normal breath sounds.  Abdominal:     General: Bowel sounds are normal.     Palpations: Abdomen is soft.  Musculoskeletal:        General: No tenderness or deformity. Normal range of motion.     Cervical back: Normal range of motion.  Lymphadenopathy:     Cervical: No cervical adenopathy.  Skin:    General: Skin is warm and dry.     Findings: No erythema or rash.  Neurological:     Mental Status: She is alert and oriented to person, place, and time.  Psychiatric:        Behavior: Behavior normal.        Thought Content: Thought content normal.        Judgment: Judgment normal.      Lab Results  Component Value Date   WBC 7.0 10/22/2019   HGB 14.7 10/22/2019   HCT 46.1 (H) 10/22/2019   MCV 89.3 10/22/2019   PLT 319 10/22/2019     Chemistry      Component Value Date/Time   NA 138 10/13/2019 1300   NA 140 06/19/2019 1540   NA 138 11/14/2012 0926   K 3.4 (L) 10/13/2019 1300   K 2.7 (LL) 11/14/2012 0926   CL 108 10/13/2019 1300   CL 96 (L) 11/14/2012 0926   CO2 19 (L) 10/13/2019 1300   CO2 29 11/14/2012 0926   BUN 27 (H) 10/13/2019 1300   BUN 19 06/19/2019 1540   BUN 19 11/14/2012 0926   CREATININE 1.66 (H) 10/13/2019 1300   CREATININE 1.27 (H) 08/29/2019 1048   CREATININE 1.3 (H) 11/14/2012 0926      Component Value Date/Time   CALCIUM 9.0 10/13/2019  1300   CALCIUM 9.5 11/14/2012 0926   ALKPHOS 75 08/29/2019 1048   ALKPHOS 61 11/14/2012 0926   AST 15 08/29/2019 1048   ALT 17 08/29/2019 1048   ALT 20 11/14/2012 0926   BILITOT 0.8 08/29/2019 1048       Impression and Plan: Amber Bautista is a 49 year old Hispanic female with a past history of infiltrating ductal carcinoma of the left breast.  This was about 8 years ago.  She had triple negative disease.  She was treated with chemotherapy.  Now, I worry that she has recurrence.  The appearance on exam is definitely consistent with inflammatory breast cancer.  We are going to have to get an MRI of the left breast.  She will need a biopsy.  I am just very worried about this breast cancer recurring.  We will have to see if it is elsewhere.  I am not sure if she would be a candidate for mastectomy.  This is incredibly complicated.  We have multiple issues that we are trying to deal with.  She has acute congestive heart failure.  She has a carcinoid.  We have to worry about these also.  I spent about 40 minutes with her today.  Again this is very complicated.  We will have to see about getting the MRI set up.  She will need a breast biopsy.  She will get her Somatuline at today.  I know that a recent study showed that every 2-week Somatuline is a possibility for those patients who have a somewhat "refractory" carcinoid.  I do not think we are there yet but this is an option for [place].  I want to get her back to see [place] in about 3-4 weeks.  By then, we should really know if we are dealing with breast cancer recurrence or a new breast cancer.   Volanda Napoleon, MD 1/13/20212:32 PM

## 2019-10-22 NOTE — Patient Instructions (Signed)
Lanreotide injection (Somatuline) What is this medicine? LANREOTIDE (lan REE oh tide) is used to reduce blood levels of growth hormone in patients with a condition called acromegaly. It also works to slow or stop tumor growth in patients with neuroendocrine tumors and treat carcinoid syndrome. This medicine may be used for other purposes; ask your health care provider or pharmacist if you have questions. COMMON BRAND NAME(S): Somatuline [place] What should I tell my health care provider before I take this medicine? They need to know if you have any of these conditions:  diabetes  gallbladder disease  heart disease  kidney disease  liver disease  thyroid disease  an unusual or allergic reaction to lanreotide, other medicines, foods, dyes, or preservatives  pregnant or trying to get pregnant  breast-feeding How should I use this medicine? This medicine is for injection under the skin. It is given by a health care professional in a hospital or clinic setting. Contact your pediatrician or health care professional regarding the use of this medicine in children. Special care may be needed. Overdosage: If you think you have taken too much of this medicine contact a poison control center or emergency room at once. NOTE: This medicine is only for you. Do not share this medicine with others. What if I miss a dose? It is important not to miss your dose. Call your doctor or health care professional if you are unable to keep an appointment. What may interact with this medicine? This medicine may interact with the following medications:  bromocriptine  cyclosporine  certain medicines for blood pressure, heart disease, irregular heart beat  certain medicines for diabetes  quinidine  terfenadine This list may not describe all possible interactions. Give your health care provider a list of all the medicines, herbs, non-prescription drugs, or dietary supplements you use. Also tell them if  you smoke, drink alcohol, or use illegal drugs. Some items may interact with your medicine. What should I watch for while using this medicine? Tell your doctor or healthcare professional if your symptoms do not start to get better or if they get worse. Visit your doctor or health care professional for regular checks on your progress. Your condition will be monitored carefully while you are receiving this medicine. This medicine may increase blood sugar. Ask your healthcare provider if changes in diet or medicines are needed if you have diabetes. You may need blood work done while you are taking this medicine. Women should inform their doctor if they wish to become pregnant or think they might be pregnant. There is a potential for serious side effects to an unborn child. Talk to your health care professional or pharmacist for more information. Do not breast-feed an infant while taking this medicine or for 6 months after stopping it. This medicine has caused ovarian failure in some women. This medicine may interfere with the ability to have a child. Talk with your doctor or health care professional if you are concerned about your fertility. What side effects may I notice from receiving this medicine? Side effects that you should report to your doctor or health care professional as soon as possible:  allergic reactions like skin rash, itching or hives, swelling of the face, lips, or tongue  increased blood pressure  severe stomach pain  signs and symptoms of hgh blood sugar such as being more thirsty or hungry or having to urinate more than normal. You may also feel very tired or have blurry vision.  signs and symptoms of low  blood sugar such as feeling anxious; confusion; dizziness; increased hunger; unusually weak or tired; sweating; shakiness; cold; irritable; headache; blurred vision; fast heartbeat; loss of consciousness  unusually slow heartbeat Side effects that usually do not require  medical attention (report to your doctor or health care professional if they continue or are bothersome):  constipation  diarrhea  dizziness  headache  muscle pain  muscle spasms  nausea  pain, redness, or irritation at site where injected This list may not describe all possible side effects. Call your doctor for medical advice about side effects. You may report side effects to FDA at 1-800-FDA-1088. Where should I keep my medicine? This drug is given in a hospital or clinic and will not be stored at home. NOTE: This sheet is a summary. It may not cover all possible information. If you have questions about this medicine, talk to your doctor, pharmacist, or health care provider.  2020 Elsevier/Gold Standard (2018-07-04 09:13:08)

## 2019-10-22 NOTE — Patient Instructions (Signed)
Medication Instructions:  none *If you need a refill on your cardiac medications before your next appointment, please call your pharmacy*  Lab Work: none If you have labs (blood work) drawn today and your tests are completely normal, you will receive your results only by: Marland Kitchen MyChart Message (if you have MyChart) OR . A paper copy in the mail If you have any lab test that is abnormal or we need to change your treatment, we will call you to review the results.  Testing/Procedures: none  Follow-Up: At [facility], you and your health needs are our priority.  As part of our continuing mission to provide you with exceptional heart care, we have created designated Provider Care Teams.  These Care Teams include your primary Cardiologist (physician) and Advanced Practice Providers (APPs -  Physician Assistants and Nurse Practitioners) who all work together to provide you with the care you need, when you need it.  Your next appointment:   4 week(s)  The format for your next appointment:   Either In Person or Virtual  Provider:   Shirlee More, MD or Dr Harriet Masson  Other Instructions

## 2019-10-23 ENCOUNTER — Telehealth: Payer: Self-pay | Admitting: Hematology & Oncology

## 2019-10-23 ENCOUNTER — Encounter: Payer: Self-pay | Admitting: *Deleted

## 2019-10-23 LAB — LACTATE DEHYDROGENASE: LDH: 243 U/L — ABNORMAL HIGH (ref 98–192)

## 2019-10-23 NOTE — Progress Notes (Signed)
Dr Marin Olp notified me of this established patient with history of breast and carcinoid, with a likely breast recurrence. He has ordered an MRI of the breast.  [place] and they have been working with the patient to obtain a CD of her most recent mammogram images. They have not yet received this. I explained to them the urgency of the need based on patient symptoms. They stated that they will reach out to the patient, and if she still doesn't have a CD, they will proceed with scanning patient, but she will first need a mammogram.  Updated Dr Marin Olp.

## 2019-10-23 NOTE — Telephone Encounter (Signed)
Called and LMVM for patient with updated appointment info per 1/13 los

## 2019-10-24 LAB — CHROMOGRANIN A: Chromogranin A (ng/mL): 5968 ng/mL — ABNORMAL HIGH (ref 0.0–101.8)

## 2019-11-06 ENCOUNTER — Other Ambulatory Visit: Payer: Self-pay

## 2019-11-06 ENCOUNTER — Ambulatory Visit
Admission: RE | Admit: 2019-11-06 | Discharge: 2019-11-06 | Disposition: A | Discharge status: Home / Self Care | Payer: Medicare HMO | Source: Ambulatory Visit | Attending: Family | Admitting: Family

## 2019-11-06 ENCOUNTER — Encounter: Payer: Self-pay | Admitting: Hematology & Oncology

## 2019-11-06 ENCOUNTER — Other Ambulatory Visit: Payer: Self-pay | Admitting: Hematology & Oncology

## 2019-11-06 DIAGNOSIS — C50411 Malignant neoplasm of upper-outer quadrant of right female breast: Secondary | ICD-10-CM

## 2019-11-07 ENCOUNTER — Encounter: Payer: Self-pay | Admitting: *Deleted

## 2019-11-10 HISTORY — PX: BREAST BIOPSY: SHX20

## 2019-11-12 ENCOUNTER — Other Ambulatory Visit: Payer: Self-pay

## 2019-11-12 ENCOUNTER — Ambulatory Visit
Admission: RE | Admit: 2019-11-12 | Discharge: 2019-11-12 | Disposition: A | Discharge status: Home / Self Care | Payer: Medicare HMO | Source: Ambulatory Visit | Attending: Hematology & Oncology | Admitting: Hematology & Oncology

## 2019-11-12 DIAGNOSIS — N6320 Unspecified lump in the left breast, unspecified quadrant: Secondary | ICD-10-CM

## 2019-11-13 ENCOUNTER — Encounter: Payer: Self-pay | Admitting: *Deleted

## 2019-11-13 ENCOUNTER — Telehealth: Payer: Self-pay | Admitting: *Deleted

## 2019-11-13 NOTE — Telephone Encounter (Signed)
Call received from [facility] to notify Dr. Marin Olp that MRI of the breast was not able to be done yesterday, 11/12/19 d/t decreased GFR.  Dr. Marin Olp notified.  No new orders received at this time.

## 2019-11-14 ENCOUNTER — Other Ambulatory Visit: Payer: Self-pay | Admitting: General Surgery

## 2019-11-14 ENCOUNTER — Other Ambulatory Visit: Payer: Self-pay | Admitting: Family

## 2019-11-15 ENCOUNTER — Encounter: Payer: Self-pay | Admitting: Family Medicine

## 2019-11-19 ENCOUNTER — Ambulatory Visit: Payer: Medicare HMO | Admitting: Cardiology

## 2019-11-20 ENCOUNTER — Other Ambulatory Visit: Payer: Self-pay

## 2019-11-20 ENCOUNTER — Encounter: Payer: Self-pay | Admitting: Hematology & Oncology

## 2019-11-20 ENCOUNTER — Inpatient Hospital Stay (HOSPITAL_BASED_OUTPATIENT_CLINIC_OR_DEPARTMENT_OTHER): Payer: Medicare HMO | Admitting: Hematology & Oncology

## 2019-11-20 ENCOUNTER — Inpatient Hospital Stay: Payer: Medicare HMO | Attending: Hematology & Oncology

## 2019-11-20 ENCOUNTER — Inpatient Hospital Stay: Payer: Medicare HMO

## 2019-11-20 VITALS — BP 109/68 | HR 79 | Temp 97.6°F | Resp 19 | Wt 234.0 lb

## 2019-11-20 DIAGNOSIS — Z853 Personal history of malignant neoplasm of breast: Secondary | ICD-10-CM | POA: Insufficient documentation

## 2019-11-20 DIAGNOSIS — N6320 Unspecified lump in the left breast, unspecified quadrant: Secondary | ICD-10-CM

## 2019-11-20 DIAGNOSIS — C50012 Malignant neoplasm of nipple and areola, left female breast: Secondary | ICD-10-CM

## 2019-11-20 DIAGNOSIS — C7B02 Secondary carcinoid tumors of liver: Secondary | ICD-10-CM | POA: Diagnosis not present

## 2019-11-20 DIAGNOSIS — Z79899 Other long term (current) drug therapy: Secondary | ICD-10-CM | POA: Insufficient documentation

## 2019-11-20 DIAGNOSIS — C50411 Malignant neoplasm of upper-outer quadrant of right female breast: Secondary | ICD-10-CM

## 2019-11-20 DIAGNOSIS — D3A098 Benign carcinoid tumors of other sites: Secondary | ICD-10-CM

## 2019-11-20 DIAGNOSIS — I509 Heart failure, unspecified: Secondary | ICD-10-CM | POA: Diagnosis not present

## 2019-11-20 DIAGNOSIS — C7B09 Secondary carcinoid tumors of other sites: Secondary | ICD-10-CM

## 2019-11-20 DIAGNOSIS — Z9221 Personal history of antineoplastic chemotherapy: Secondary | ICD-10-CM | POA: Diagnosis not present

## 2019-11-20 DIAGNOSIS — I5022 Chronic systolic (congestive) heart failure: Secondary | ICD-10-CM | POA: Diagnosis not present

## 2019-11-20 DIAGNOSIS — Z171 Estrogen receptor negative status [ER-]: Secondary | ICD-10-CM | POA: Diagnosis not present

## 2019-11-20 DIAGNOSIS — C7A019 Malignant carcinoid tumor of the small intestine, unspecified portion: Secondary | ICD-10-CM | POA: Diagnosis present

## 2019-11-20 LAB — CMP (CANCER CENTER ONLY)
ALT: 12 U/L (ref 0–44)
AST: 13 U/L — ABNORMAL LOW (ref 15–41)
Albumin: 4 g/dL (ref 3.5–5.0)
Alkaline Phosphatase: 79 U/L (ref 38–126)
Anion gap: 10 (ref 5–15)
BUN: 35 mg/dL — ABNORMAL HIGH (ref 6–20)
CO2: 28 mmol/L (ref 22–32)
Calcium: 9.4 mg/dL (ref 8.9–10.3)
Chloride: 103 mmol/L (ref 98–111)
Creatinine: 2.36 mg/dL — ABNORMAL HIGH (ref 0.44–1.00)
GFR, Est AFR Am: 27 mL/min — ABNORMAL LOW (ref >60)
GFR, Estimated: 23 mL/min — ABNORMAL LOW (ref >60)
Glucose, Bld: 103 mg/dL — ABNORMAL HIGH (ref 70–99)
Potassium: 3.6 mmol/L (ref 3.5–5.1)
Sodium: 141 mmol/L (ref 135–145)
Total Bilirubin: 1.1 mg/dL (ref 0.3–1.2)
Total Protein: 6.8 g/dL (ref 6.5–8.1)

## 2019-11-20 MED ORDER — LANREOTIDE ACETATE 120 MG/0.5ML ~~LOC~~ SOLN
120.0000 mg | Freq: Once | SUBCUTANEOUS | Status: AC
  Administered 2019-11-20: 120 mg via SUBCUTANEOUS

## 2019-11-20 MED ORDER — NALTREXONE HCL 50 MG PO TABS: 25.0000 mg | ORAL_TABLET | Freq: Every day | ORAL | 3 refills | Status: DC

## 2019-11-20 NOTE — Progress Notes (Signed)
Hematology and Oncology Follow Up Visit  Amber Bautista [id number] 03-11-1971 49 y.o. 11/20/2019   Principle Diagnosis:   Metastatic carcinoid --progressive with liver metastasis  Stage IIb (T2N1M0) ductal carcinoma of the left breast  -- Triple negative  Congestive heart failure  Current Therapy:    Somatuline 120 mg IM monthly -- re-started on 08/29/2019     Interim History:  Amber Bautista is back for follow-up.  Hopefully, we "dodged a bullet"with the fact that the biopsy of her left breast did not show any malignancy.  I thought for sure that she was going to have an inflammatory breast cancer in the left breast.  She cannot have an MRI because of her renal insufficiency.    She had a punch biopsy done by Dr. Donne Hazel on 11/14/2019.  The pathology report ([id number]) showed features of atypical vascular lesion.  Again, there is no malignancy noted.  It was felt that this might represent some sequela of her having congestive heart failure.  She has been hospitalized for congestive heart failure.  She feels tired.  She is itching quite a bit.  She has these lesions all over her body which are small maculopapular type lesions from itching.  I am going to try to put her on naltrexone (25 mg p.o. daily) and see if this might help the itching.  Her last chromogranin A level was 6000.  Again try to get her on a regular program of Somatuline.  She is not having diarrhea.  She just feels tired.  She does not have a lot of energy.  I will check a TSH on her.  This is truly complicated.  A lot is going on.  The biggest problem I think is going to be her congestive heart failure.  She is being followed by cardiology.  I think she sees them tomorrow.  Hopefully, they might be able to help with diuresing her or to at least try to get her heart function better.  She has had no fever.  She does have a lot of anxiety.  There is no headache.  Overall, I would say her performance status is  probably ECOG 2.  Medications:  Current Outpatient Medications:  .  furosemide (LASIX) 40 MG tablet, Take 1 tablet (40 mg total) by mouth daily., Disp: 90 tablet, Rfl: 3 .  magnesium chloride (SLOW-MAG) 64 MG TBEC SR tablet, Take 1 tablet (64 mg total) by mouth 2 (two) times daily. (Patient taking differently: Take 1 tablet by mouth daily. ), Disp: 120 tablet, Rfl: 0 .  metoprolol succinate (TOPROL-XL) 50 MG 24 hr tablet, Take 50 mg by mouth daily. Take with or immediately following a meal., Disp: , Rfl:  .  naltrexone (DEPADE) 50 MG tablet, Take 0.5 tablets (25 mg total) by mouth daily., Disp: 30 tablet, Rfl: 3 .  potassium chloride SA (KLOR-CON) 20 MEQ tablet, Take 2 tablets (40 mEq total) by mouth 2 (two) times daily., Disp: 90 tablet, Rfl: 3 .  sacubitril-valsartan (ENTRESTO) 49-51 MG, Take 1 tablet by mouth 2 (two) times daily., Disp: 180 tablet, Rfl: 1 .  spironolactone (ALDACTONE) 25 MG tablet, Take 25 mg by mouth daily., Disp: , Rfl:   Allergies:  Allergies  Allergen Reactions  . Sulfamethoxazole-Trimethoprim Swelling and Hypertension    Swelling and hypertension after taking, was admitted to hospital  . Adhesive [Tape] Rash and Other (See Comments)    Causes SEVERE RASHES AND BLISTERS    Past Medical History, Surgical history, Social history,  and Family History were reviewed and updated.  Review of Systems: Review of Systems  Constitutional: Positive for fatigue.  HENT:  Negative.   Eyes: Negative.   Respiratory: Positive for shortness of breath.   Cardiovascular: Positive for palpitations.  Gastrointestinal: Positive for diarrhea and nausea.  Endocrine: Negative.   Genitourinary: Negative.    Musculoskeletal: Positive for back pain and myalgias.  Skin: Negative.   Neurological: Positive for light-headedness. Negative for numbness.  Hematological: Negative.   Psychiatric/Behavioral: Negative.     Physical Exam:  weight is 234 lb (106.1 kg). Her temporal temperature  is 97.6 F (36.4 C). Her blood pressure is 109/68 and her pulse is 79. Her respiration is 19 and oxygen saturation is 100%.   Wt Readings from Last 3 Encounters:  11/20/19 234 lb (106.1 kg)  10/22/19 231 lb 12.8 oz (105.1 kg)  10/22/19 233 lb (105.7 kg)    Physical Exam Vitals reviewed.  Constitutional:      Comments: Her breast exam shows a swollen left breast.  There is some skin changes.  There is a slight hyperpigmentation of the left breast.  There is firmness of the left breast.  There is no nipple discharge.  I cannot palpate any left axillary adenopathy.  Right breast is relatively unremarkable.  HENT:     Head: Normocephalic and atraumatic.  Eyes:     Pupils: Pupils are equal, round, and reactive to light.  Cardiovascular:     Rate and Rhythm: Normal rate and regular rhythm.     Heart sounds: Normal heart sounds.  Pulmonary:     Effort: Pulmonary effort is normal.     Breath sounds: Normal breath sounds.  Abdominal:     General: Bowel sounds are normal.     Palpations: Abdomen is soft.  Musculoskeletal:        General: No tenderness or deformity. Normal range of motion.     Cervical back: Normal range of motion.  Lymphadenopathy:     Cervical: No cervical adenopathy.  Skin:    General: Skin is warm and dry.     Findings: No erythema or rash.  Neurological:     Mental Status: She is alert and oriented to person, place, and time.  Psychiatric:        Behavior: Behavior normal.        Thought Content: Thought content normal.        Judgment: Judgment normal.      Lab Results  Component Value Date   WBC 7.0 10/22/2019   HGB 14.7 10/22/2019   HCT 46.1 (H) 10/22/2019   MCV 89.3 10/22/2019   PLT 319 10/22/2019     Chemistry      Component Value Date/Time   NA 141 11/20/2019 1314   NA 140 06/19/2019 1540   NA 138 11/14/2012 0926   K 3.6 11/20/2019 1314   K 2.7 (LL) 11/14/2012 0926   CL 103 11/20/2019 1314   CL 96 (L) 11/14/2012 0926   CO2 28 11/20/2019  1314   CO2 29 11/14/2012 0926   BUN 35 (H) 11/20/2019 1314   BUN 19 06/19/2019 1540   BUN 19 11/14/2012 0926   CREATININE 2.36 (H) 11/20/2019 1314   CREATININE 1.3 (H) 11/14/2012 0926      Component Value Date/Time   CALCIUM 9.4 11/20/2019 1314   CALCIUM 9.5 11/14/2012 0926   ALKPHOS 79 11/20/2019 1314   ALKPHOS 61 11/14/2012 0926   AST 13 (L) 11/20/2019 1314   ALT 12  11/20/2019 1314   ALT 20 11/14/2012 0926   BILITOT 1.1 11/20/2019 1314       Impression and Plan: Ms. Chevere is a 49 year old Hispanic female with a past history of infiltrating ductal carcinoma of the left breast.  This was about 8 years ago.  She had triple negative disease.  She was treated with chemotherapy.  I feel much better now that the biopsy does not show breast cancer.  I would not think that the surgeon missed a lesion.  He is incredibly thorough and I am sure did a very extensive biopsy.  I will speak to her cardiologist to see if they can try to increase her cardiac output and may be improve her ejection fraction and see if this might help get some of the fluid out of her left breast.  As far as her neuroendocrine tumor is concerned, I would like to hope that the Chromogranin A level is better.  If not, we may have to think about a more aggressive intervention.  I know that there is information that shows that Somatuline every 2 weeks might be beneficial.  This is truly complex.  She has a lot going on right now.  I just want her quality of life to be better.  I spent about 40 minutes or so with her today.  A lot was going on that we had to try to address and try to improve for her.    I want to get her back to see [place] in about 3-4 weeks.   Volanda Napoleon, MD 2/11/20214:12 PM

## 2019-11-20 NOTE — Patient Instructions (Signed)
Lanreotide injection What is this medicine? LANREOTIDE (lan REE oh tide) is used to reduce blood levels of growth hormone in patients with a condition called acromegaly. It also works to slow or stop tumor growth in patients with neuroendocrine tumors and treat carcinoid syndrome. This medicine may be used for other purposes; ask your health care provider or pharmacist if you have questions. COMMON BRAND NAME(S): Somatuline [place] What should I tell my health care provider before I take this medicine? They need to know if you have any of these conditions:  diabetes  gallbladder disease  heart disease  kidney disease  liver disease  thyroid disease  an unusual or allergic reaction to lanreotide, other medicines, foods, dyes, or preservatives  pregnant or trying to get pregnant  breast-feeding How should I use this medicine? This medicine is for injection under the skin. It is given by a health care professional in a hospital or clinic setting. Contact your pediatrician or health care professional regarding the use of this medicine in children. Special care may be needed. Overdosage: If you think you have taken too much of this medicine contact a poison control center or emergency room at once. NOTE: This medicine is only for you. Do not share this medicine with others. What if I miss a dose? It is important not to miss your dose. Call your doctor or health care professional if you are unable to keep an appointment. What may interact with this medicine? This medicine may interact with the following medications:  bromocriptine  cyclosporine  certain medicines for blood pressure, heart disease, irregular heart beat  certain medicines for diabetes  quinidine  terfenadine This list may not describe all possible interactions. Give your health care provider a list of all the medicines, herbs, non-prescription drugs, or dietary supplements you use. Also tell them if you smoke,  drink alcohol, or use illegal drugs. Some items may interact with your medicine. What should I watch for while using this medicine? Tell your doctor or healthcare professional if your symptoms do not start to get better or if they get worse. Visit your doctor or health care professional for regular checks on your progress. Your condition will be monitored carefully while you are receiving this medicine. This medicine may increase blood sugar. Ask your healthcare provider if changes in diet or medicines are needed if you have diabetes. You may need blood work done while you are taking this medicine. Women should inform their doctor if they wish to become pregnant or think they might be pregnant. There is a potential for serious side effects to an unborn child. Talk to your health care professional or pharmacist for more information. Do not breast-feed an infant while taking this medicine or for 6 months after stopping it. This medicine has caused ovarian failure in some women. This medicine may interfere with the ability to have a child. Talk with your doctor or health care professional if you are concerned about your fertility. What side effects may I notice from receiving this medicine? Side effects that you should report to your doctor or health care professional as soon as possible:  allergic reactions like skin rash, itching or hives, swelling of the face, lips, or tongue  increased blood pressure  severe stomach pain  signs and symptoms of hgh blood sugar such as being more thirsty or hungry or having to urinate more than normal. You may also feel very tired or have blurry vision.  signs and symptoms of low blood   sugar such as feeling anxious; confusion; dizziness; increased hunger; unusually weak or tired; sweating; shakiness; cold; irritable; headache; blurred vision; fast heartbeat; loss of consciousness  unusually slow heartbeat Side effects that usually do not require medical  attention (report to your doctor or health care professional if they continue or are bothersome):  constipation  diarrhea  dizziness  headache  muscle pain  muscle spasms  nausea  pain, redness, or irritation at site where injected This list may not describe all possible side effects. Call your doctor for medical advice about side effects. You may report side effects to FDA at 1-800-FDA-1088. Where should I keep my medicine? This drug is given in a hospital or clinic and will not be stored at home. NOTE: This sheet is a summary. It may not cover all possible information. If you have questions about this medicine, talk to your doctor, pharmacist, or health care provider.  2020 Elsevier/Gold Standard (2018-07-04 09:13:08)  

## 2019-11-21 ENCOUNTER — Ambulatory Visit: Payer: Medicare HMO | Admitting: Cardiology

## 2019-11-21 ENCOUNTER — Encounter: Payer: Self-pay | Admitting: *Deleted

## 2019-11-21 LAB — TSH: TSH: 3.851 u[IU]/mL (ref 0.308–3.960)

## 2019-11-21 LAB — CHROMOGRANIN A

## 2019-11-21 LAB — LUTEINIZING HORMONE: LH: 28.4 m[IU]/mL

## 2019-11-21 LAB — FOLLICLE STIMULATING HORMONE: FSH: 97.1 m[IU]/mL

## 2019-11-21 LAB — CANCER ANTIGEN 27.29: CA 27.29: 19.1 U/mL (ref 0.0–38.6)

## 2019-11-21 LAB — LACTATE DEHYDROGENASE: LDH: 220 U/L — ABNORMAL HIGH (ref 98–192)

## 2019-11-21 NOTE — Progress Notes (Deleted)
Cardiology Office Note:    Date:  11/21/2019   ID:  Amber Bautista, DOB 05-10-1971, MRN [id number]  PCP:  Jolinda Croak, MD  Cardiologist:  Shirlee More, MD    Referring MD: Jolinda Croak, MD    ASSESSMENT:    No diagnosis found. PLAN:    In order of problems listed above:  1. ***   Next appointment: ***   Medication Adjustments/Labs and Tests Ordered: Current medicines are reviewed at length with the patient today.  Concerns regarding medicines are outlined above.  No orders of the defined types were placed in this encounter.  No orders of the defined types were placed in this encounter.   No chief complaint on file.   History of Present Illness:    Amber BRUMMET is a 49 y.o. 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  Her last echocardiogram 19 2020 showed improvement EF of 45 to 50%.  ZIO monitor was performed 04/07/2019 which showed frequent PVCs 7.4% less than 1% incidence of couplets and triplets.  She has been seen in consultation by electrophysiology.  She last seen 10/16/2019 with decompensated heart failure and fluid overload with intensification of her diuretic therapy.  She was last seen 10/22/2019. Compliance with diet, lifestyle and medications: ***   1 mo ago  (10/13/19) 2 mo ago  (08/29/19) 4 mo ago  (07/23/19) 4 mo ago  (07/22/19)   Sodium 135 - 145 mmol/L 138  139  137  138   Potassium 3.5 - 5.1 mmol/L 3.4Low   2.9Low Panic  CM  5.0  3.2Low    Chloride 98 - 111 mmol/L 108  106  107  106   CO2 22 - 32 mmol/L 19Low   22  21Low   20Low    Glucose, Bld 70 - 99 mg/dL 111High   115High   103High   114High    BUN 6 - 20 mg/dL 27High   22High   15  13   Creatinine, Ser 0.44 - 1.00 mg/dL 1.66High   1.27High   1.27High   1.32High    Calcium 8.9 - 10.3 mg/dL 9.0  9.1  8.7Low   8.7Low    GFR calc non Af Amer >60 mL/min 36Low   50Low   50Low   47Low    GFR calc Af  Amer >60 mL/min 42Low   57Low   57Low   55Low     From cancer center:  1 mo ago  (10/22/19) 1 mo ago  (10/13/19) 2 mo ago  (08/29/19) 4 mo ago  (07/23/19)   Sodium 135 - 145 mmol/L 138  138  139  137   Potassium 3.5 - 5.1 mmol/L 3.1Low   3.4Low   2.9Low Panic  CM  5.0   Chloride 98 - 111 mmol/L 106  108  106  107   CO2 22 - 32 mmol/L 18Low   19Low   22  21Low    Glucose, Bld 70 - 99 mg/dL 122High   111High   115High   103High    BUN 6 - 20 mg/dL 30High   27High   22High   15   Creatinine 0.44 - 1.00 mg/dL 2.10High   1.66High   1.27High   1.27    1 mo ago 4 mo ago   B Natriuretic Peptide 0.0 - 100.0 pg/mL 301.6High   69.8 CM  Past Medical History:  Diagnosis Date  . Abdominal pain 12/17/2014  . Breast cancer ([place]) 07/12/12   Stage IIB Ductal Carcinoma to  Upper Outer Quadrant: Left Breast: Triple Negative  . Cancer ([place]) 03/03/2015   Overview:  in intestine-2007, in liver-2007, and left breast-2013  . Cancer of upper-outer quadrant of female breast ([place]) 08/01/2012   Image guided biopsy Oct., 2013 -    2 cm by MRI, solitary;     IDC; TNBC; positive LVI;      . Carcinoid tumor of intestine 2001   [place] 2007  . Carcinoma of breast treated with adjuvant chemotherapy ([place])    4 cycles of Carboplatin / Taxol  . Chronic congestive heart failure ([place]) 03/17/2019  . Congestive heart failure (CHF) ([place])   . Diarrhea    Sandostatin  . Frequent PVCs 04/01/2019  . Goals of care, counseling/discussion 03/31/2019  . Hypertension   . Metastatic carcinoid tumor ([place])   . Morbidly obese ([place])   . Personal history of chemotherapy 2013   Left Breast Cancer  . Personal history of radiation therapy 2013   Left Breast Cancer  . S/P radiation therapy 02/04/2013-02/21/2013   Left Breast and Axilla / 46.8 Gy in 26 fractions were planned (she only received 18 Gy in 10 fractions)  . Syncope 07/2019    Past Surgical History:  Procedure Laterality Date  . BREAST LUMPECTOMY Left 2013  . BREAST SURGERY   08/23/12   Lt br lumpectomy  . [place] 2007  . COLON SURGERY  2001   Cancer/ Intestinal Resection  . OVARIAN CYST REMOVAL  2001  . PARTIAL MASTECTOMY WITH NEEDLE LOCALIZATION AND AXILLARY SENTINEL LYMPH NODE BX  08/23/2012   Procedure: PARTIAL MASTECTOMY WITH NEEDLE LOCALIZATION AND AXILLARY SENTINEL LYMPH NODE BX;  Surgeon: Adin Hector, MD;  Location: [facility];  Service: General;  Laterality: Left;  blue dye injection   . PORTACATH PLACEMENT  08/23/2012   Procedure: INSERTION PORT-A-CATH;  Surgeon: Adin Hector, MD;  Location: [facility];  Service: General;  Laterality: Right;  intraop ultrasound    Current Medications: No outpatient medications have been marked as taking for the 11/21/19 encounter (Appointment) with Richardo Priest, MD.     Allergies:   Sulfamethoxazole-trimethoprim and Adhesive [tape]   Social History   Socioeconomic History  . Marital status: Divorced    Spouse name: Not on file  . Number of children: Not on file  . Years of education: Not on file  . Highest education level: Not on file  Occupational History  . Not on file  Tobacco Use  . Smoking status: Never Smoker  . Smokeless tobacco: Never Used  . Tobacco comment: NEVER SMOKED  Substance and Sexual Activity  . Alcohol use: No    Alcohol/week: 0.0 standard drinks  . Drug use: No  . Sexual activity: Not Currently    Comment: menarche age 37, P 3, G 1, 1 misscarriage  Other Topics Concern  . Not on file  Social History Narrative  . Not on file   Social Determinants of Health   Financial Resource Strain:   . Difficulty of Paying Living Expenses: Not on file  Food Insecurity:   . Worried About Charity fundraiser in the Last Year: Not on file  . Ran Out of Food in the Last Year: Not on file  Transportation Needs:   . Lack of Transportation (Medical): Not on file  . Lack of Transportation (Non-Medical): Not  on file  Physical Activity:   . Days of Exercise per Week: Not on file    . Minutes of Exercise per Session: Not on file  Stress:   . Feeling of Stress : Not on file  Social Connections:   . Frequency of Communication with Friends and Family: Not on file  . Frequency of Social Gatherings with Friends and Family: Not on file  . Attends Religious Services: Not on file  . Active Member of Clubs or Organizations: Not on file  . Attends Archivist Meetings: Not on file  . Marital Status: Not on file     Family History: The patient's ***family history includes Asthma in her brother; Cancer in her paternal grandmother; Diabetes in her father and mother; Glaucoma in her mother; Heart attack in her father; Hypertension in her brother and mother. There is no history of Colon cancer, Esophageal cancer, Rectal cancer, or Stomach cancer. ROS:   Please see the history of present illness.    All other systems reviewed and are negative.  EKGs/Labs/Other Studies Reviewed:    The following studies were reviewed today:  EKG:  EKG ordered today and personally reviewed.  The ekg ordered today demonstrates ***  Recent Labs: 05/20/2019: NT-Pro BNP 1,071 07/23/2019: Magnesium 1.9 10/13/2019: B Natriuretic Peptide 301.6 10/22/2019: Hemoglobin 14.7; Platelet Count 319 11/20/2019: ALT 12; BUN 35; Creatinine 2.36; Potassium 3.6; Sodium 141  Recent Lipid Panel No results found for: CHOL, TRIG, HDL, CHOLHDL, VLDL, LDLCALC, LDLDIRECT  Physical Exam:    VS:  LMP 08/18/2014 (LMP Unknown) Comment: menopausal: chemo induced    Wt Readings from Last 3 Encounters:  11/20/19 234 lb (106.1 kg)  10/22/19 231 lb 12.8 oz (105.1 kg)  10/22/19 233 lb (105.7 kg)     GEN: *** Well nourished, well developed in no acute distress HEENT: Normal NECK: No JVD; No carotid bruits LYMPHATICS: No lymphadenopathy CARDIAC: ***RRR, no murmurs, rubs, gallops RESPIRATORY:  Clear to auscultation without rales, wheezing or rhonchi  ABDOMEN: Soft, non-tender, non-distended MUSCULOSKELETAL:  No  edema; No deformity  SKIN: Warm and dry NEUROLOGIC:  Alert and oriented x 3 PSYCHIATRIC:  Normal affect    Signed, Shirlee More, MD  11/21/2019 7:40 AM    [place]

## 2019-11-26 ENCOUNTER — Other Ambulatory Visit: Payer: Medicare HMO

## 2019-11-27 LAB — ESTRADIOL, ULTRA SENS: Estradiol, Sensitive: 8.2 pg/mL

## 2019-12-16 NOTE — Progress Notes (Signed)
Cardiology Office Note:    Date:  12/17/2019   ID:  Amber Bautista, DOB 06-24-1971, MRN [id number]  PCP:  Amber Croak, MD  Cardiologist:  Amber More, MD    Referring MD: Amber Croak, MD    ASSESSMENT:    1. Chronic combined systolic and diastolic heart failure ([place])   2. Bilateral leg edema   3. Hypertensive heart disease with heart failure (Amber Bautista)   4. PVC's (premature ventricular contractions)   5. Hypomagnesemia   6. CKD (chronic kidney disease) stage 4, GFR 15-29 ml/min (HCC)    PLAN:    In order of problems listed above:  1. She is improved from heart failure although not yet ideal weight and with residual edema.  I do not want to push her diuretics too hard recheck renal function and may need to stop her MRA.  For now continue her beta-blocker as well as Entresto. 2. Stable BP continue guideline directed therapy 3. Recheck labs including magnesium and GFR along with potassium   Next appointment: 6 weeks   Medication Adjustments/Labs and Tests Ordered: Current medicines are reviewed at length with the patient today.  Concerns regarding medicines are outlined above.  Orders Placed This Encounter  Procedures  . Basic metabolic panel  . Pro b natriuretic peptide (BNP)   Meds ordered this encounter  Medications  . furosemide (LASIX) 40 MG tablet    Sig: Take 1 tab by mouth daily if weight is 230 lbs or less, take 2 tabs by mouth daily if weight is 231lbs or greater.    Dispense:  90 tablet    Refill:  3    No chief complaint on file.   History of Present Illness:    Oncology note relates that she has metastatic carcinoid progressive with liver mets stage IIb carcinoma left breast triple negative.  Most recent labs from the oncology center 11/20/2019 with worsening renal function creatinine 2.36 GFR cc potassium 3.6.  Amber Bautista is a 49 y.o. female with a hx of  heart failure and severe dilated cardiomyopathy related to chemotherapy as well as  frequent PVCs and syncope.  Her last echocardiogram 05/23/2019 showed improvement EF of 45 to 50%.  ZIO monitor was performed 04/07/2019 which showed frequent PVCs 7.4% less than 1% incidence of couplets and triplets.  She has been seen in consultation by electrophysiology.  She was seen 10/16/2019 with decompensated heart failure and fluid overload with intensification of her diuretic therapy.  QRS duration with left bundle branch block is 148 ms.  I reviewed her most recent echocardiogram and I think her ejection fraction is Bautista in the range of 35-40 and 40 to 45%.  She was last seen 10/22/2019. Compliance with diet, lifestyle and medications: Yes  She was seen at [place] ED 12/12/2019 for heart failure.  X-ray showed no evidence of infiltrate or pulmonary vascular congestion.  G show the same pattern of left bundle branch block sinus rhythm frequent PVCs.  Laboratory test showed a creatinine of 1.35 GFR 46 cc potassium 3.5 normal liver function test.  Normal proBNP was severely elevated 7247.  ABC showed a hemoglobin of 16.6 platelets 293,000.  Since the ED visit and doubling her diuretic through the weekend her weight is down somewhere in the range of 5 to 8 pounds and she is disproportionately improved.  Presently no shortness of breath and less edema no chest pain palpitation or syncope.  We decided to use a weight-based approach to heart  failure she will take furosemide 40 mg daily for weight of 230 or less and twice daily if higher.  She had quite severe renal insufficiency and recheck her kidney function proBNP today.  I am concerned clinically that her edema is related to both heart disease and perhaps liver involvement with her malignancy. Past Medical History:  Diagnosis Date  . Abdominal pain 12/17/2014  . Breast cancer ([place]) 07/12/12   Stage IIB Ductal Carcinoma to  Upper Outer Quadrant: Left Breast: Triple Negative  . Cancer ([place]) 03/03/2015   Overview:  in intestine-2007, in  liver-2007, and left breast-2013  . Cancer of upper-outer quadrant of female breast ([place]) 08/01/2012   Image guided biopsy Oct., 2013 -    2 cm by MRI, solitary;     IDC; TNBC; positive LVI;      . Carcinoid tumor of intestine 2001   [place] 2007  . Carcinoma of breast treated with adjuvant chemotherapy ([place])    4 cycles of Carboplatin / Taxol  . Chronic congestive heart failure ([place]) 03/17/2019  . Congestive heart failure (CHF) ([place])   . Diarrhea    Amber Bautista  . Frequent PVCs 04/01/2019  . Goals of care, counseling/discussion 03/31/2019  . Hypertension   . Metastatic carcinoid tumor ([place])   . Morbidly obese ([place])   . Personal history of chemotherapy 2013   Left Breast Cancer  . Personal history of radiation therapy 2013   Left Breast Cancer  . S/P radiation therapy 02/04/2013-02/21/2013   Left Breast and Axilla / 46.8 Gy in 26 fractions were planned (she only received 18 Gy in 10 fractions)  . Syncope 07/2019    Past Surgical History:  Procedure Laterality Date  . BREAST LUMPECTOMY Left 2013  . BREAST SURGERY  08/23/12   Lt br lumpectomy  . [place] 2007  . COLON SURGERY  2001   Cancer/ Intestinal Resection  . OVARIAN CYST REMOVAL  2001  . PARTIAL MASTECTOMY WITH NEEDLE LOCALIZATION AND AXILLARY SENTINEL LYMPH NODE BX  08/23/2012   Procedure: PARTIAL MASTECTOMY WITH NEEDLE LOCALIZATION AND AXILLARY SENTINEL LYMPH NODE BX;  Surgeon: Amber Hector, MD;  Location: [facility];  Service: General;  Laterality: Left;  blue dye injection   . PORTACATH PLACEMENT  08/23/2012   Procedure: INSERTION PORT-A-CATH;  Surgeon: Amber Hector, MD;  Location: [facility];  Service: General;  Laterality: Right;  intraop ultrasound    Current Medications: Current Meds  Medication Sig  . furosemide (LASIX) 40 MG tablet Take 1 tab by mouth daily if weight is 230 lbs or less, take 2 tabs by mouth daily if weight is 231lbs or greater.  . magnesium chloride (SLOW-MAG) 64 MG TBEC SR tablet Take 1  tablet (64 mg total) by mouth 2 (two) times daily. (Patient taking differently: Take 1 tablet by mouth daily. )  . metoprolol succinate (TOPROL-XL) 50 MG 24 hr tablet Take 50 mg by mouth daily. Take with or immediately following a meal.  . naltrexone (DEPADE) 50 MG tablet Take 0.5 tablets (25 mg total) by mouth daily.  . potassium chloride SA (KLOR-CON) 20 MEQ tablet Take 2 tablets (40 mEq total) by mouth 2 (two) times daily.  . sacubitril-valsartan (ENTRESTO) 49-51 MG Take 1 tablet by mouth 2 (two) times daily.  Marland Kitchen spironolactone (ALDACTONE) 25 MG tablet Take 25 mg by mouth daily.  . [DISCONTINUED] furosemide (LASIX) 40 MG tablet Take 1 tablet (40 mg total) by mouth daily.     Allergies:  Sulfamethoxazole-trimethoprim and Adhesive [tape]   Social History   Socioeconomic History  . Marital status: Divorced    Spouse name: Not on file  . Number of children: Not on file  . Years of education: Not on file  . Highest education level: Not on file  Occupational History  . Not on file  Tobacco Use  . Smoking status: Never Smoker  . Smokeless tobacco: Never Used  . Tobacco comment: NEVER SMOKED  Substance and Sexual Activity  . Alcohol use: No    Alcohol/week: 0.0 standard drinks  . Drug use: No  . Sexual activity: Not Currently    Comment: menarche age 50, P 3, G 1, 1 misscarriage  Other Topics Concern  . Not on file  Social History Narrative  . Not on file   Social Determinants of Health   Financial Resource Strain:   . Difficulty of Paying Living Expenses: Not on file  Food Insecurity:   . Worried About Charity fundraiser in the Last Year: Not on file  . Ran Out of Food in the Last Year: Not on file  Transportation Needs:   . Lack of Transportation (Medical): Not on file  . Lack of Transportation (Non-Medical): Not on file  Physical Activity:   . Days of Exercise per Week: Not on file  . Minutes of Exercise per Session: Not on file  Stress:   . Feeling of Stress : Not on  file  Social Connections:   . Frequency of Communication with Friends and Family: Not on file  . Frequency of Social Gatherings with Friends and Family: Not on file  . Attends Religious Services: Not on file  . Active Member of Clubs or Organizations: Not on file  . Attends Archivist Meetings: Not on file  . Marital Status: Not on file     Family History: The patient's family history includes Asthma in her brother; Cancer in her paternal grandmother; Diabetes in her father and mother; Glaucoma in her mother; Heart attack in her father; Hypertension in her brother and mother. There is no history of Colon cancer, Esophageal cancer, Rectal cancer, or Stomach cancer. ROS:   Please see the history of present illness.    All other systems reviewed and are negative.  EKGs/Labs/Other Studies Reviewed:    The following studies were reviewed today:    Recent Labs: 05/20/2019: NT-Pro BNP 1,071 07/23/2019: Magnesium 1.9 10/13/2019: B Natriuretic Peptide 301.6 10/22/2019: Hemoglobin 14.7; Platelet Count 319 11/20/2019: ALT 12; BUN 35; Creatinine 2.36; Potassium 3.6; Sodium 141; TSH 3.851  Recent Lipid Panel No results found for: CHOL, TRIG, HDL, CHOLHDL, VLDL, LDLCALC, LDLDIRECT  Physical Exam:    VS:  BP 130/90   Pulse (!) 105   Temp 97.8 F (36.6 C)   Ht 5\' 6"  (1.676 m)   Wt 233 lb (105.7 kg)   LMP 08/18/2014 (LMP Unknown) Comment: menopausal: chemo induced  SpO2 99%   BMI 37.61 kg/m     Wt Readings from Last 3 Encounters:  12/17/19 233 lb (105.7 kg)  11/20/19 234 lb (106.1 kg)  10/22/19 231 lb 12.8 oz (105.1 kg)     GEN:  Well nourished, well developed in no acute distress HEENT: Normal NECK: No JVD; No carotid bruits LYMPHATICS: No lymphadenopathy CARDIAC: RRR, no murmurs, rubs, gallops RESPIRATORY:  Clear to auscultation without rales, wheezing or rhonchi  ABDOMEN: Soft, non-tender, non-distended MUSCULOSKELETAL: 1-2+ lower extremity edema edema; No deformity    SKIN: Warm and dry NEUROLOGIC:  Alert and oriented x 3 PSYCHIATRIC:  Normal affect    Signed, Amber More, MD  12/17/2019 3:32 PM    [facility]

## 2019-12-17 ENCOUNTER — Other Ambulatory Visit: Payer: Self-pay

## 2019-12-17 ENCOUNTER — Ambulatory Visit: Payer: Medicare HMO | Admitting: Cardiology

## 2019-12-17 ENCOUNTER — Encounter: Payer: Self-pay | Admitting: Cardiology

## 2019-12-17 VITALS — BP 130/90 | HR 105 | Temp 97.8°F | Ht 66.0 in | Wt 233.0 lb

## 2019-12-17 DIAGNOSIS — I493 Ventricular premature depolarization: Secondary | ICD-10-CM

## 2019-12-17 DIAGNOSIS — I110 Hypertensive heart disease with heart failure: Secondary | ICD-10-CM

## 2019-12-17 DIAGNOSIS — R600 Localized edema: Secondary | ICD-10-CM | POA: Diagnosis not present

## 2019-12-17 DIAGNOSIS — I5042 Chronic combined systolic (congestive) and diastolic (congestive) heart failure: Secondary | ICD-10-CM | POA: Diagnosis not present

## 2019-12-17 DIAGNOSIS — N184 Chronic kidney disease, stage 4 (severe): Secondary | ICD-10-CM

## 2019-12-17 MED ORDER — FUROSEMIDE 40 MG PO TABS: ORAL_TABLET | ORAL | 3 refills | Status: DC

## 2019-12-17 MED FILL — FUROSEMIDE 40 MG TAB: 40 | 45 days supply | Qty: 90 | Fill #0

## 2019-12-17 NOTE — Patient Instructions (Signed)
Medication Instructions:  Your physician has recommended you make the following change in your medication:  1-Take Furosemide 40 mg by mouth daily if your weight is 230 lbs or less, take by 80 mg by mouth daily if your weight is 231 lbs or more.  *If you need a refill on your cardiac medications before your next appointment, please call your pharmacy*  Lab Work: Your physician recommends that you have lab work today- BMET and ProPNP  If you have labs (blood work) drawn today and your tests are completely normal, you will receive your results only by: Marland Kitchen MyChart Message (if you have MyChart) OR . A paper copy in the mail If you have any lab test that is abnormal or we need to change your treatment, we will call you to review the results.  Follow-Up: At [facility], you and your health needs are our priority.  As part of our continuing mission to provide you with exceptional heart care, we have created designated Provider Care Teams.  These Care Teams include your primary Cardiologist (physician) and Advanced Practice Providers (APPs -  Physician Assistants and Nurse Practitioners) who all work together to provide you with the care you need, when you need it.  We recommend signing up for the patient portal called "MyChart".  Sign up information is provided on this After Visit Summary.  MyChart is used to connect with patients for Virtual Visits (Telemedicine).  Patients are able to view lab/test results, encounter notes, upcoming appointments, etc.  Non-urgent messages can be sent to your provider as well.   To learn more about what you can do with MyChart, go to [email or web address].    Your next appointment:   4 week(s)  The format for your next appointment:   In Person  Provider:   You may see Shirlee More, MD or the following Advanced Practice Provider on your designated Care Team:    Laurann Montana, FNP

## 2019-12-18 ENCOUNTER — Telehealth: Payer: Self-pay | Admitting: *Deleted

## 2019-12-18 DIAGNOSIS — I5022 Chronic systolic (congestive) heart failure: Secondary | ICD-10-CM

## 2019-12-18 LAB — PRO B NATRIURETIC PEPTIDE: NT-Pro BNP: 5909 pg/mL — ABNORMAL HIGH (ref 0–249)

## 2019-12-18 LAB — BASIC METABOLIC PANEL
BUN/Creatinine Ratio: 11 (ref 9–23)
BUN: 29 mg/dL — ABNORMAL HIGH (ref 6–24)
CO2: 21 mmol/L (ref 20–29)
Calcium: 9.6 mg/dL (ref 8.7–10.2)
Chloride: 100 mmol/L (ref 96–106)
Creatinine, Ser: 2.6 mg/dL — ABNORMAL HIGH (ref 0.57–1.00)
GFR calc Af Amer: 24 mL/min/{1.73_m2} — ABNORMAL LOW (ref >59)
GFR calc non Af Amer: 21 mL/min/{1.73_m2} — ABNORMAL LOW (ref >59)
Glucose: 105 mg/dL — ABNORMAL HIGH (ref 65–99)
Potassium: 3 mmol/L — ABNORMAL LOW (ref 3.5–5.2)
Sodium: 144 mmol/L (ref 134–144)

## 2019-12-18 NOTE — Addendum Note (Signed)
Addended by: Particia Nearing B on: 12/18/2019 09:49 AM   Modules accepted: Orders

## 2019-12-18 NOTE — Telephone Encounter (Signed)
Left message to return call 

## 2019-12-18 NOTE — Telephone Encounter (Signed)
-----   Message from Richardo Priest, MD sent at 12/18/2019  8:01 AM EST ----- Normal or stable result  Her kidney function continues to worsen, I think we should discontinue spironolactone and hold her Entresto and recheck a BMP in 1 week.  I am not going to increase her potassium with her GFR so severely reduced.  When we recheck her labs next week lets also do a magnesium level.

## 2019-12-19 ENCOUNTER — Telehealth: Payer: Self-pay | Admitting: *Deleted

## 2019-12-19 ENCOUNTER — Other Ambulatory Visit: Payer: Self-pay

## 2019-12-19 ENCOUNTER — Inpatient Hospital Stay: Payer: Medicare HMO

## 2019-12-19 ENCOUNTER — Inpatient Hospital Stay (HOSPITAL_BASED_OUTPATIENT_CLINIC_OR_DEPARTMENT_OTHER): Payer: Medicare HMO | Admitting: Hematology & Oncology

## 2019-12-19 ENCOUNTER — Encounter: Payer: Self-pay | Admitting: Hematology & Oncology

## 2019-12-19 VITALS — BP 106/63 | HR 54 | Temp 96.9°F | Resp 17 | Wt 236.0 lb

## 2019-12-19 DIAGNOSIS — C50411 Malignant neoplasm of upper-outer quadrant of right female breast: Secondary | ICD-10-CM

## 2019-12-19 DIAGNOSIS — I5023 Acute on chronic systolic (congestive) heart failure: Secondary | ICD-10-CM | POA: Diagnosis not present

## 2019-12-19 DIAGNOSIS — C7B09 Secondary carcinoid tumors of other sites: Secondary | ICD-10-CM

## 2019-12-19 DIAGNOSIS — D3A098 Benign carcinoid tumors of other sites: Secondary | ICD-10-CM

## 2019-12-19 DIAGNOSIS — I5022 Chronic systolic (congestive) heart failure: Secondary | ICD-10-CM

## 2019-12-19 DIAGNOSIS — I130 Hypertensive heart and chronic kidney disease with heart failure and stage 1 through stage 4 chronic kidney disease, or unspecified chronic kidney disease: Secondary | ICD-10-CM | POA: Diagnosis not present

## 2019-12-19 LAB — CBC WITH DIFFERENTIAL (CANCER CENTER ONLY)
Abs Immature Granulocytes: 0.03 10*3/uL (ref 0.00–0.07)
Basophils Absolute: 0 10*3/uL (ref 0.0–0.1)
Basophils Relative: 0 %
Eosinophils Absolute: 0.1 10*3/uL (ref 0.0–0.5)
Eosinophils Relative: 2 %
HCT: 43.6 % (ref 36.0–46.0)
Hemoglobin: 13.6 g/dL (ref 12.0–15.0)
Immature Granulocytes: 1 %
Lymphocytes Relative: 36 %
Lymphs Abs: 2.2 10*3/uL (ref 0.7–4.0)
MCH: 27.8 pg (ref 26.0–34.0)
MCHC: 31.2 g/dL (ref 30.0–36.0)
MCV: 89 fL (ref 80.0–100.0)
Monocytes Absolute: 0.1 10*3/uL (ref 0.1–1.0)
Monocytes Relative: 1 %
Neutro Abs: 3.6 10*3/uL (ref 1.7–7.7)
Neutrophils Relative %: 60 %
Platelet Count: 219 10*3/uL (ref 150–400)
RBC: 4.9 MIL/uL (ref 3.87–5.11)
RDW: 18.1 % — ABNORMAL HIGH (ref 11.5–15.5)
WBC Count: 6.1 10*3/uL (ref 4.0–10.5)
nRBC: 0.3 % — ABNORMAL HIGH (ref 0.0–0.2)

## 2019-12-19 LAB — CMP (CANCER CENTER ONLY)
ALT: 12 U/L (ref 0–44)
AST: 14 U/L — ABNORMAL LOW (ref 15–41)
Albumin: 4 g/dL (ref 3.5–5.0)
Alkaline Phosphatase: 101 U/L (ref 38–126)
Anion gap: 12 (ref 5–15)
BUN: 34 mg/dL — ABNORMAL HIGH (ref 6–20)
CO2: 25 mmol/L (ref 22–32)
Calcium: 9.2 mg/dL (ref 8.9–10.3)
Chloride: 101 mmol/L (ref 98–111)
Creatinine: 2.51 mg/dL — ABNORMAL HIGH (ref 0.44–1.00)
GFR, Est AFR Am: 25 mL/min — ABNORMAL LOW (ref >60)
GFR, Estimated: 22 mL/min — ABNORMAL LOW (ref >60)
Glucose, Bld: 140 mg/dL — ABNORMAL HIGH (ref 70–99)
Potassium: 2.8 mmol/L — CL (ref 3.5–5.1)
Sodium: 138 mmol/L (ref 135–145)
Total Bilirubin: 0.9 mg/dL (ref 0.3–1.2)
Total Protein: 7.1 g/dL (ref 6.5–8.1)

## 2019-12-19 MED ORDER — LANREOTIDE ACETATE 120 MG/0.5ML ~~LOC~~ SOLN
120.0000 mg | Freq: Once | SUBCUTANEOUS | Status: AC
  Administered 2019-12-19: 120 mg via SUBCUTANEOUS

## 2019-12-19 MED ORDER — LANREOTIDE ACETATE 120 MG/0.5ML ~~LOC~~ SOLN
SUBCUTANEOUS | Status: AC
  Filled 2019-12-19: qty 120

## 2019-12-19 NOTE — Patient Instructions (Signed)
Lanreotide injection What is this medicine? LANREOTIDE (lan REE oh tide) is used to reduce blood levels of growth hormone in patients with a condition called acromegaly. It also works to slow or stop tumor growth in patients with neuroendocrine tumors and treat carcinoid syndrome. This medicine may be used for other purposes; ask your health care provider or pharmacist if you have questions. COMMON BRAND NAME(S): Somatuline [place] What should I tell my health care provider before I take this medicine? They need to know if you have any of these conditions:  diabetes  gallbladder disease  heart disease  kidney disease  liver disease  thyroid disease  an unusual or allergic reaction to lanreotide, other medicines, foods, dyes, or preservatives  pregnant or trying to get pregnant  breast-feeding How should I use this medicine? This medicine is for injection under the skin. It is given by a health care professional in a hospital or clinic setting. Contact your pediatrician or health care professional regarding the use of this medicine in children. Special care may be needed. Overdosage: If you think you have taken too much of this medicine contact a poison control center or emergency room at once. NOTE: This medicine is only for you. Do not share this medicine with others. What if I miss a dose? It is important not to miss your dose. Call your doctor or health care professional if you are unable to keep an appointment. What may interact with this medicine? This medicine may interact with the following medications:  bromocriptine  cyclosporine  certain medicines for blood pressure, heart disease, irregular heart beat  certain medicines for diabetes  quinidine  terfenadine This list may not describe all possible interactions. Give your health care provider a list of all the medicines, herbs, non-prescription drugs, or dietary supplements you use. Also tell them if you smoke,  drink alcohol, or use illegal drugs. Some items may interact with your medicine. What should I watch for while using this medicine? Tell your doctor or healthcare professional if your symptoms do not start to get better or if they get worse. Visit your doctor or health care professional for regular checks on your progress. Your condition will be monitored carefully while you are receiving this medicine. This medicine may increase blood sugar. Ask your healthcare provider if changes in diet or medicines are needed if you have diabetes. You may need blood work done while you are taking this medicine. Women should inform their doctor if they wish to become pregnant or think they might be pregnant. There is a potential for serious side effects to an unborn child. Talk to your health care professional or pharmacist for more information. Do not breast-feed an infant while taking this medicine or for 6 months after stopping it. This medicine has caused ovarian failure in some women. This medicine may interfere with the ability to have a child. Talk with your doctor or health care professional if you are concerned about your fertility. What side effects may I notice from receiving this medicine? Side effects that you should report to your doctor or health care professional as soon as possible:  allergic reactions like skin rash, itching or hives, swelling of the face, lips, or tongue  increased blood pressure  severe stomach pain  signs and symptoms of hgh blood sugar such as being more thirsty or hungry or having to urinate more than normal. You may also feel very tired or have blurry vision.  signs and symptoms of low blood   sugar such as feeling anxious; confusion; dizziness; increased hunger; unusually weak or tired; sweating; shakiness; cold; irritable; headache; blurred vision; fast heartbeat; loss of consciousness  unusually slow heartbeat Side effects that usually do not require medical  attention (report to your doctor or health care professional if they continue or are bothersome):  constipation  diarrhea  dizziness  headache  muscle pain  muscle spasms  nausea  pain, redness, or irritation at site where injected This list may not describe all possible side effects. Call your doctor for medical advice about side effects. You may report side effects to FDA at 1-800-FDA-1088. Where should I keep my medicine? This drug is given in a hospital or clinic and will not be stored at home. NOTE: This sheet is a summary. It may not cover all possible information. If you have questions about this medicine, talk to your doctor, pharmacist, or health care provider.  2020 Elsevier/Gold Standard (2018-07-04 09:13:08)  

## 2019-12-19 NOTE — Progress Notes (Signed)
Hematology and Oncology Follow Up Visit  NAHOMI HEGNER [id number] Jun 07, 1971 49 y.o. 12/19/2019   Principle Diagnosis:   Metastatic carcinoid --progressive with liver metastasis  Stage IIb (T2N1M0) ductal carcinoma of the left breast  -- Triple negative  Congestive heart failure  Current Therapy:    Somatuline 120 mg IM monthly -- re-started on 08/29/2019     Interim History:  Ms. Ambrose is back for follow-up.  He still having trouble with her congestive heart failure.  She was in the emergency room last week.  She had fluid retention.  Did not look like much was done outside of having her increase her Lasix.  She says she feels a little better.  She says her legs are not nearly as swollen.  We last saw her, she was complaining of a lot of pruritus.  We tried some naloxone on her.  After 1 dose she stopped taking it because she said it is made her feel funny.  It does not seem like the itching is as bad however.  We are still trying to deal with the neuroendocrine tumor.  She will get her Somatuline today.  Will be interesting to see what the Chromogranin A level is.  Thankfully, her left breast is not nearly as swollen.  This hopefully is a byproduct of her having better diuresis.  She said the cardiologist worried about her kidney function.  Her creatinine is 2.51.  Again I think this is all related to the congestive heart failure.  There is been no problems with fever.  She has had no obvious bleeding.  She has had no cough.  Para she has had a little bit of nausea.  There is been little bit of diarrhea.  I think she takes Pepto Bismol for this.  Overall, I would say her performance status is ECOG 2.     Medications:  Current Outpatient Medications:  .  furosemide (LASIX) 40 MG tablet, Take 1 tab by mouth daily if weight is 230 lbs or less, take 2 tabs by mouth daily if weight is 231lbs or greater., Disp: 90 tablet, Rfl: 3 .  magnesium chloride (SLOW-MAG) 64 MG TBEC SR  tablet, Take 1 tablet (64 mg total) by mouth 2 (two) times daily. (Patient taking differently: Take 1 tablet by mouth daily. ), Disp: 120 tablet, Rfl: 0 .  metoprolol succinate (TOPROL-XL) 50 MG 24 hr tablet, Take 50 mg by mouth daily. Take with or immediately following a meal., Disp: , Rfl:  .  naltrexone (DEPADE) 50 MG tablet, Take 0.5 tablets (25 mg total) by mouth daily., Disp: 30 tablet, Rfl: 3 .  potassium chloride SA (KLOR-CON) 20 MEQ tablet, Take 2 tablets (40 mEq total) by mouth 2 (two) times daily., Disp: 90 tablet, Rfl: 3 .  sacubitril-valsartan (ENTRESTO) 49-51 MG, Take 1 tablet by mouth 2 (two) times daily., Disp: 180 tablet, Rfl: 1 .  spironolactone (ALDACTONE) 25 MG tablet, Take 25 mg by mouth daily., Disp: , Rfl:   Allergies:  Allergies  Allergen Reactions  . Sulfamethoxazole-Trimethoprim Swelling and Hypertension    Swelling and hypertension after taking, was admitted to hospital  . Adhesive [Tape] Rash and Other (See Comments)    Causes SEVERE RASHES AND BLISTERS    Past Medical History, Surgical history, Social history, and Family History were reviewed and updated.  Review of Systems: Review of Systems  Constitutional: Positive for fatigue.  HENT:  Negative.   Eyes: Negative.   Respiratory: Positive for shortness of breath.  Cardiovascular: Positive for palpitations.  Gastrointestinal: Positive for diarrhea and nausea.  Endocrine: Negative.   Genitourinary: Negative.    Musculoskeletal: Positive for back pain and myalgias.  Skin: Negative.   Neurological: Positive for light-headedness. Negative for numbness.  Hematological: Negative.   Psychiatric/Behavioral: Negative.     Physical Exam:  weight is 236 lb (107 kg). Her temporal temperature is 96.9 F (36.1 C) (abnormal). Her blood pressure is 106/63 and her pulse is 54 (abnormal). Her respiration is 17 and oxygen saturation is 99%.   Wt Readings from Last 3 Encounters:  12/19/19 236 lb (107 kg)  12/17/19  233 lb (105.7 kg)  11/20/19 234 lb (106.1 kg)    Physical Exam Vitals reviewed.  Constitutional:      Comments: Her breast exam shows a mildly swollen left breast that is much less swollen than previous.  There is not as much hyperpigmentation.  The breast is not nearly as firm..   There is no nipple discharge.  I cannot palpate any left axillary adenopathy.  Right breast is relatively unremarkable.  HENT:     Head: Normocephalic and atraumatic.  Eyes:     Pupils: Pupils are equal, round, and reactive to light.  Cardiovascular:     Rate and Rhythm: Normal rate and regular rhythm.     Heart sounds: Normal heart sounds.  Pulmonary:     Effort: Pulmonary effort is normal.     Breath sounds: Normal breath sounds.  Abdominal:     General: Bowel sounds are normal.     Palpations: Abdomen is soft.  Musculoskeletal:        General: No tenderness or deformity. Normal range of motion.     Cervical back: Normal range of motion.  Lymphadenopathy:     Cervical: No cervical adenopathy.  Skin:    General: Skin is warm and dry.     Findings: No erythema or rash.  Neurological:     Mental Status: She is alert and oriented to person, place, and time.  Psychiatric:        Behavior: Behavior normal.        Thought Content: Thought content normal.        Judgment: Judgment normal.      Lab Results  Component Value Date   WBC 6.1 12/19/2019   HGB 13.6 12/19/2019   HCT 43.6 12/19/2019   MCV 89.0 12/19/2019   PLT 219 12/19/2019     Chemistry      Component Value Date/Time   NA 138 12/19/2019 1344   NA 144 12/17/2019 1532   NA 138 11/14/2012 0926   K 2.8 (LL) 12/19/2019 1344   K 2.7 (LL) 11/14/2012 0926   CL 101 12/19/2019 1344   CL 96 (L) 11/14/2012 0926   CO2 25 12/19/2019 1344   CO2 29 11/14/2012 0926   BUN 34 (H) 12/19/2019 1344   BUN 29 (H) 12/17/2019 1532   BUN 19 11/14/2012 0926   CREATININE 2.51 (H) 12/19/2019 1344   CREATININE 1.3 (H) 11/14/2012 0926      Component  Value Date/Time   CALCIUM 9.2 12/19/2019 1344   CALCIUM 9.5 11/14/2012 0926   ALKPHOS 101 12/19/2019 1344   ALKPHOS 61 11/14/2012 0926   AST 14 (L) 12/19/2019 1344   ALT 12 12/19/2019 1344   ALT 20 11/14/2012 0926   BILITOT 0.9 12/19/2019 1344       Impression and Plan: Ms. Nazzaro is a 49 year old Hispanic female with a past history of infiltrating  ductal carcinoma of the left breast.  This was about 8 years ago.  She had triple negative disease.  She was treated with chemotherapy.  Thankfully, the left breast does not look nearly as bad.  Again we have to see how the Chromogranin A level is.  This will be very helpful.  I really hope that we do not have to think about chemotherapy for her.  I know that this neuroendocrine tumor can be an issue.  Hopefully, we can keep her on a schedule with the Somatuline so that she can get her treatments on time.  I still think that her overall prognosis is related to the congestive heart failure.  I would like to see her back in 3-4 weeks.   Volanda Napoleon, MD 3/12/20212:57 PM

## 2019-12-19 NOTE — Telephone Encounter (Signed)
Dr. Marin Olp notified of potassium-2.8.  No new orders received at this time.

## 2019-12-19 NOTE — Telephone Encounter (Signed)
Spoke with patient. Informed of results and need to stop spironolactone and hold Entresto. Repeat BMP and Mag in 1 week. Pt verbalized understanding.

## 2019-12-20 ENCOUNTER — Observation Stay (HOSPITAL_COMMUNITY): Payer: Medicare HMO

## 2019-12-20 ENCOUNTER — Inpatient Hospital Stay (HOSPITAL_COMMUNITY)
Admission: EM | Admit: 2019-12-20 | Discharge: 2020-01-03 | DRG: 286 | Disposition: A | Discharge status: Hospice | Payer: Medicare HMO | Attending: Internal Medicine | Admitting: Internal Medicine

## 2019-12-20 ENCOUNTER — Other Ambulatory Visit: Payer: Self-pay

## 2019-12-20 ENCOUNTER — Observation Stay (HOSPITAL_BASED_OUTPATIENT_CLINIC_OR_DEPARTMENT_OTHER): Payer: Medicare HMO

## 2019-12-20 ENCOUNTER — Emergency Department (HOSPITAL_COMMUNITY): Payer: Medicare HMO

## 2019-12-20 ENCOUNTER — Encounter (HOSPITAL_COMMUNITY): Payer: Self-pay

## 2019-12-20 DIAGNOSIS — Z66 Do not resuscitate: Secondary | ICD-10-CM | POA: Diagnosis not present

## 2019-12-20 DIAGNOSIS — I5043 Acute on chronic combined systolic (congestive) and diastolic (congestive) heart failure: Secondary | ICD-10-CM | POA: Diagnosis present

## 2019-12-20 DIAGNOSIS — E340 Carcinoid syndrome: Secondary | ICD-10-CM

## 2019-12-20 DIAGNOSIS — Z83511 Family history of glaucoma: Secondary | ICD-10-CM

## 2019-12-20 DIAGNOSIS — Z833 Family history of diabetes mellitus: Secondary | ICD-10-CM

## 2019-12-20 DIAGNOSIS — I5189 Other ill-defined heart diseases: Secondary | ICD-10-CM | POA: Diagnosis not present

## 2019-12-20 DIAGNOSIS — T451X5S Adverse effect of antineoplastic and immunosuppressive drugs, sequela: Secondary | ICD-10-CM

## 2019-12-20 DIAGNOSIS — R0602 Shortness of breath: Secondary | ICD-10-CM

## 2019-12-20 DIAGNOSIS — Z8249 Family history of ischemic heart disease and other diseases of the circulatory system: Secondary | ICD-10-CM

## 2019-12-20 DIAGNOSIS — I472 Ventricular tachycardia: Secondary | ICD-10-CM | POA: Diagnosis present

## 2019-12-20 DIAGNOSIS — E669 Obesity, unspecified: Secondary | ICD-10-CM | POA: Diagnosis present

## 2019-12-20 DIAGNOSIS — I5082 Biventricular heart failure: Secondary | ICD-10-CM | POA: Diagnosis present

## 2019-12-20 DIAGNOSIS — I361 Nonrheumatic tricuspid (valve) insufficiency: Secondary | ICD-10-CM | POA: Diagnosis present

## 2019-12-20 DIAGNOSIS — I5023 Acute on chronic systolic (congestive) heart failure: Secondary | ICD-10-CM

## 2019-12-20 DIAGNOSIS — Z515 Encounter for palliative care: Secondary | ICD-10-CM | POA: Diagnosis not present

## 2019-12-20 DIAGNOSIS — I427 Cardiomyopathy due to drug and external agent: Secondary | ICD-10-CM | POA: Diagnosis present

## 2019-12-20 DIAGNOSIS — Z853 Personal history of malignant neoplasm of breast: Secondary | ICD-10-CM

## 2019-12-20 DIAGNOSIS — D136 Benign neoplasm of pancreas: Secondary | ICD-10-CM

## 2019-12-20 DIAGNOSIS — Z882 Allergy status to sulfonamides status: Secondary | ICD-10-CM

## 2019-12-20 DIAGNOSIS — Z9221 Personal history of antineoplastic chemotherapy: Secondary | ICD-10-CM

## 2019-12-20 DIAGNOSIS — I420 Dilated cardiomyopathy: Secondary | ICD-10-CM | POA: Diagnosis present

## 2019-12-20 DIAGNOSIS — I130 Hypertensive heart and chronic kidney disease with heart failure and stage 1 through stage 4 chronic kidney disease, or unspecified chronic kidney disease: Principal | ICD-10-CM | POA: Diagnosis present

## 2019-12-20 DIAGNOSIS — N179 Acute kidney failure, unspecified: Secondary | ICD-10-CM | POA: Diagnosis present

## 2019-12-20 DIAGNOSIS — C7B09 Secondary carcinoid tumors of other sites: Secondary | ICD-10-CM

## 2019-12-20 DIAGNOSIS — Z923 Personal history of irradiation: Secondary | ICD-10-CM

## 2019-12-20 DIAGNOSIS — Z85030 Personal history of malignant carcinoid tumor of large intestine: Secondary | ICD-10-CM

## 2019-12-20 DIAGNOSIS — I509 Heart failure, unspecified: Secondary | ICD-10-CM

## 2019-12-20 DIAGNOSIS — Z825 Family history of asthma and other chronic lower respiratory diseases: Secondary | ICD-10-CM

## 2019-12-20 DIAGNOSIS — Z800 Family history of malignant neoplasm of digestive organs: Secondary | ICD-10-CM

## 2019-12-20 DIAGNOSIS — Z9049 Acquired absence of other specified parts of digestive tract: Secondary | ICD-10-CM

## 2019-12-20 DIAGNOSIS — T451X5A Adverse effect of antineoplastic and immunosuppressive drugs, initial encounter: Secondary | ICD-10-CM | POA: Diagnosis present

## 2019-12-20 DIAGNOSIS — Z79899 Other long term (current) drug therapy: Secondary | ICD-10-CM

## 2019-12-20 DIAGNOSIS — N189 Chronic kidney disease, unspecified: Secondary | ICD-10-CM

## 2019-12-20 DIAGNOSIS — N184 Chronic kidney disease, stage 4 (severe): Secondary | ICD-10-CM | POA: Diagnosis present

## 2019-12-20 DIAGNOSIS — Z9012 Acquired absence of left breast and nipple: Secondary | ICD-10-CM

## 2019-12-20 DIAGNOSIS — C7B02 Secondary carcinoid tumors of liver: Secondary | ICD-10-CM | POA: Diagnosis present

## 2019-12-20 DIAGNOSIS — Z6837 Body mass index (BMI) 37.0-37.9, adult: Secondary | ICD-10-CM

## 2019-12-20 DIAGNOSIS — Z20822 Contact with and (suspected) exposure to covid-19: Secondary | ICD-10-CM | POA: Diagnosis present

## 2019-12-20 DIAGNOSIS — I493 Ventricular premature depolarization: Secondary | ICD-10-CM | POA: Diagnosis present

## 2019-12-20 DIAGNOSIS — D3A098 Benign carcinoid tumors of other sites: Secondary | ICD-10-CM

## 2019-12-20 DIAGNOSIS — I372 Nonrheumatic pulmonary valve stenosis with insufficiency: Secondary | ICD-10-CM | POA: Diagnosis present

## 2019-12-20 DIAGNOSIS — E876 Hypokalemia: Secondary | ICD-10-CM | POA: Diagnosis present

## 2019-12-20 DIAGNOSIS — I447 Left bundle-branch block, unspecified: Secondary | ICD-10-CM | POA: Diagnosis present

## 2019-12-20 DIAGNOSIS — I5084 End stage heart failure: Secondary | ICD-10-CM | POA: Diagnosis present

## 2019-12-20 LAB — PROTEIN / CREATININE RATIO, URINE
Creatinine, Urine: 31.55 mg/dL
Protein Creatinine Ratio: 0.38 mg/mg{Cre} — ABNORMAL HIGH (ref 0.00–0.15)
Total Protein, Urine: 12 mg/dL

## 2019-12-20 LAB — CBC WITH DIFFERENTIAL/PLATELET
Abs Immature Granulocytes: 0.04 10*3/uL (ref 0.00–0.07)
Basophils Absolute: 0 10*3/uL (ref 0.0–0.1)
Basophils Relative: 0 %
Eosinophils Absolute: 0.1 10*3/uL (ref 0.0–0.5)
Eosinophils Relative: 2 %
HCT: 46.1 % — ABNORMAL HIGH (ref 36.0–46.0)
Hemoglobin: 14 g/dL (ref 12.0–15.0)
Immature Granulocytes: 1 %
Lymphocytes Relative: 49 %
Lymphs Abs: 3.2 10*3/uL (ref 0.7–4.0)
MCH: 27.6 pg (ref 26.0–34.0)
MCHC: 30.4 g/dL (ref 30.0–36.0)
MCV: 90.7 fL (ref 80.0–100.0)
Monocytes Absolute: 0.1 10*3/uL (ref 0.1–1.0)
Monocytes Relative: 1 %
Neutro Abs: 3.1 10*3/uL (ref 1.7–7.7)
Neutrophils Relative %: 47 %
Platelets: 217 10*3/uL (ref 150–400)
RBC: 5.08 MIL/uL (ref 3.87–5.11)
RDW: 18.4 % — ABNORMAL HIGH (ref 11.5–15.5)
WBC: 6.6 10*3/uL (ref 4.0–10.5)
nRBC: 0.3 % — ABNORMAL HIGH (ref 0.0–0.2)

## 2019-12-20 LAB — URINALYSIS, ROUTINE W REFLEX MICROSCOPIC
Bilirubin Urine: NEGATIVE
Glucose, UA: NEGATIVE mg/dL
Hgb urine dipstick: NEGATIVE
Ketones, ur: NEGATIVE mg/dL
Leukocytes,Ua: NEGATIVE
Nitrite: NEGATIVE
Protein, ur: NEGATIVE mg/dL
Specific Gravity, Urine: 1.005 (ref 1.005–1.030)
pH: 5 (ref 5.0–8.0)

## 2019-12-20 LAB — HEPATIC FUNCTION PANEL
ALT: 16 U/L (ref 0–44)
AST: 19 U/L (ref 15–41)
Albumin: 3.5 g/dL (ref 3.5–5.0)
Alkaline Phosphatase: 95 U/L (ref 38–126)
Bilirubin, Direct: 0.4 mg/dL — ABNORMAL HIGH (ref 0.0–0.2)
Indirect Bilirubin: 0.9 mg/dL (ref 0.3–0.9)
Total Bilirubin: 1.3 mg/dL — ABNORMAL HIGH (ref 0.3–1.2)
Total Protein: 6.9 g/dL (ref 6.5–8.1)

## 2019-12-20 LAB — BASIC METABOLIC PANEL
Anion gap: 15 (ref 5–15)
BUN: 32 mg/dL — ABNORMAL HIGH (ref 6–20)
CO2: 21 mmol/L — ABNORMAL LOW (ref 22–32)
Calcium: 9 mg/dL (ref 8.9–10.3)
Chloride: 102 mmol/L (ref 98–111)
Creatinine, Ser: 2.66 mg/dL — ABNORMAL HIGH (ref 0.44–1.00)
GFR calc Af Amer: 23 mL/min — ABNORMAL LOW (ref >60)
GFR calc non Af Amer: 20 mL/min — ABNORMAL LOW (ref >60)
Glucose, Bld: 122 mg/dL — ABNORMAL HIGH (ref 70–99)
Potassium: 3.7 mmol/L (ref 3.5–5.1)
Sodium: 138 mmol/L (ref 135–145)

## 2019-12-20 LAB — SARS CORONAVIRUS 2 (TAT 6-24 HRS): SARS Coronavirus 2: NEGATIVE

## 2019-12-20 LAB — TROPONIN I (HIGH SENSITIVITY)
Troponin I (High Sensitivity): 29 ng/L — ABNORMAL HIGH (ref <18)
Troponin I (High Sensitivity): 35 ng/L — ABNORMAL HIGH (ref <18)

## 2019-12-20 LAB — CANCER ANTIGEN 27.29: CA 27.29: 28.7 U/mL (ref 0.0–38.6)

## 2019-12-20 LAB — I-STAT BETA HCG BLOOD, ED (MC, WL, AP ONLY): I-stat hCG, quantitative: <5 m[IU]/mL (ref <5)

## 2019-12-20 LAB — MAGNESIUM: Magnesium: 1.6 mg/dL — ABNORMAL LOW (ref 1.7–2.4)

## 2019-12-20 LAB — BRAIN NATRIURETIC PEPTIDE: B Natriuretic Peptide: 382.1 pg/mL — ABNORMAL HIGH (ref 0.0–100.0)

## 2019-12-20 MED ORDER — ENOXAPARIN SODIUM 40 MG/0.4ML ~~LOC~~ SOLN
40.0000 mg | SUBCUTANEOUS | Status: DC
  Administered 2019-12-20 – 2019-12-24 (×5): 40 mg via SUBCUTANEOUS
  Filled 2019-12-20 (×5): qty 0.4

## 2019-12-20 MED ORDER — MAGNESIUM SULFATE 2 GM/50ML IV SOLN
2.0000 g | Freq: Once | INTRAVENOUS | Status: AC
  Administered 2019-12-20: 2 g via INTRAVENOUS
  Filled 2019-12-20: qty 50

## 2019-12-20 MED ORDER — ACETAMINOPHEN 325 MG PO TABS
650.0000 mg | ORAL_TABLET | Freq: Four times a day (QID) | ORAL | Status: DC | PRN
  Administered 2019-12-26 (×2): 650 mg via ORAL
  Filled 2019-12-20 (×2): qty 2

## 2019-12-20 MED ORDER — PROMETHAZINE HCL 25 MG/ML IJ SOLN
6.2500 mg | Freq: Once | INTRAMUSCULAR | Status: AC
  Administered 2019-12-20: 6.25 mg via INTRAVENOUS
  Filled 2019-12-20: qty 1

## 2019-12-20 MED ORDER — FUROSEMIDE 10 MG/ML IJ SOLN
40.0000 mg | INTRAMUSCULAR | Status: AC
  Administered 2019-12-20: 40 mg via INTRAVENOUS
  Filled 2019-12-20: qty 4

## 2019-12-20 MED ORDER — METOPROLOL SUCCINATE ER 25 MG PO TB24
50.0000 mg | ORAL_TABLET | Freq: Every day | ORAL | Status: DC
  Administered 2019-12-20: 50 mg via ORAL
  Filled 2019-12-20: qty 2

## 2019-12-20 MED ORDER — METOPROLOL SUCCINATE ER 50 MG PO TB24
75.0000 mg | ORAL_TABLET | Freq: Every day | ORAL | Status: DC
  Administered 2019-12-21: 75 mg via ORAL
  Filled 2019-12-20: qty 1

## 2019-12-20 MED ORDER — FUROSEMIDE 10 MG/ML IJ SOLN
120.0000 mg | Freq: Once | INTRAVENOUS | Status: AC
  Administered 2019-12-20: 120 mg via INTRAVENOUS
  Filled 2019-12-20: qty 12

## 2019-12-20 MED ORDER — ACETAMINOPHEN 650 MG RE SUPP: 650.0000 mg | Freq: Four times a day (QID) | RECTAL | Status: DC | PRN

## 2019-12-20 MED ORDER — POTASSIUM CHLORIDE CRYS ER 20 MEQ PO TBCR
40.0000 meq | EXTENDED_RELEASE_TABLET | Freq: Once | ORAL | Status: AC
  Administered 2019-12-20: 40 meq via ORAL
  Filled 2019-12-20: qty 2

## 2019-12-20 MED ORDER — METOPROLOL SUCCINATE ER 25 MG PO TB24
25.0000 mg | ORAL_TABLET | Freq: Once | ORAL | Status: AC
  Administered 2019-12-20: 25 mg via ORAL
  Filled 2019-12-20: qty 1

## 2019-12-20 NOTE — Progress Notes (Signed)
  Echocardiogram 2D Echocardiogram has been performed.  Amber Bautista 12/20/2019, 4:41 PM

## 2019-12-20 NOTE — H&P (Signed)
Date: 12/20/2019               Patient Name:  Amber Bautista MRN: [id number]  DOB: 12/03/1970 Age / Sex: 49 y.o., female   PCP: Jolinda Croak, MD         Medical Service: Internal Medicine Teaching Service         Attending Physician: Dr. Lucious Groves, DO    First Contact: Dr. Molli Hazard Pager: [phone]  Second Contact: Dr. Ladona Horns Pager: [phone]       After Hours (After 5p/  First Contact Pager: [phone]  weekends / holidays): Second Contact Pager: [phone]   Chief Complaint: shortness of breath  History of Present Illness:  Amber Bautista is a 49 year old F with significant PMH of metastatic carcinoid tumor, stage IIB ductal triple negative L breast cancer, HFrEF, nonischemic cardiomyopathy, and obesity, who presents with shortness of breath and a general unwell feeling. Pt states she is feeling horrible, like her heart is racing and she just "feels off."  Says this has happened before. When it happened before last week she was seen in the emergency room and was also very swollen. Her lasix was increased to 40mg  BID. Pt endorses that her abdominal and LE edema has improved since then, but is not back to her baseline. She was seen by her cardiologist later this week, who instructed her to continue taking 40mg  BID PRN if weights or swelling increased. Also at that time her entresto was stopped due to worsening renal function. Describes decreased urine output recently. She'll have the urge to go but not much comes out. She denies dysuria but does have urge without urination. Says she is always thirsty and is drinking about 4-5 16oz water bottles in addition to other fluids each day.  Currently, she does not feel short of breath but endorses anxiety. Denies history of anxiety attacks, but agrees that her anxiety worsens her dyspnea. At home pt sits up with 2-3 pillows, but will only use 1 when falling asleep. States that after falling asleep she will awake gasping for air and have  to sit back up again. Denies fevers/chills, cough, sore throat, sick contacts, nausea/vomiting, or abdominal pain. Weights herself everyday in the morning, but says her weights "have be all out of whack, between 230-237."  Social:  Pt lives in [place] with her 23 year old daughter and mother. Walks independently and is able to perform her own ADLs. No history of smoking or drinking  Family History:  Mom - HTN and TIIDM Father - died from MI, had TIIDM  Meds:  Current Meds  Medication Sig  . calcium carbonate (TUMS - DOSED IN MG ELEMENTAL CALCIUM) 500 MG chewable tablet Chew 1 tablet by mouth daily as needed for indigestion or heartburn.  . furosemide (LASIX) 40 MG tablet Take 1 tab by mouth daily if weight is 230 lbs or less, take 2 tabs by mouth daily if weight is 231lbs or greater.  . magnesium chloride (SLOW-MAG) 64 MG TBEC SR tablet Take 1 tablet (64 mg total) by mouth 2 (two) times daily. (Patient taking differently: Take 1 tablet by mouth daily. )  . metoprolol succinate (TOPROL-XL) 50 MG 24 hr tablet Take 50 mg by mouth daily. Take with or immediately following a meal.  . potassium chloride SA (KLOR-CON) 20 MEQ tablet Take 2 tablets (40 mEq total) by mouth 2 (two) times daily.  . sacubitril-valsartan (ENTRESTO) 49-51 MG Take 1 tablet  by mouth 2 (two) times daily.   Allergies: Allergies as of 12/20/2019 - Review Complete 12/20/2019  Allergen Reaction Noted  . Sulfamethoxazole-trimethoprim Swelling and Hypertension 11/30/2014  . Adhesive [tape] Rash and Other (See Comments) 04/14/2019   Past Medical History:  Diagnosis Date  . Abdominal pain 12/17/2014  . Breast cancer ([place]) 07/12/12   Stage IIB Ductal Carcinoma to  Upper Outer Quadrant: Left Breast: Triple Negative  . Cancer ([place]) 03/03/2015   Overview:  in intestine-2007, in liver-2007, and left breast-2013  . Cancer of upper-outer quadrant of female breast ([place]) 08/01/2012   Image guided biopsy Oct., 2013 -    2 cm by MRI,  solitary;     IDC; TNBC; positive LVI;      . Carcinoid tumor of intestine 2001   Mets 2007  . Carcinoma of breast treated with adjuvant chemotherapy ([place])    4 cycles of Carboplatin / Taxol  . Chronic congestive heart failure ([place]) 03/17/2019  . Congestive heart failure (CHF) ([place])   . Diarrhea    Sandostatin  . Frequent PVCs 04/01/2019  . Goals of care, counseling/discussion 03/31/2019  . Hypertension   . Metastatic carcinoid tumor ([place])   . Morbidly obese ([place])   . Personal history of chemotherapy 2013   Left Breast Cancer  . Personal history of radiation therapy 2013   Left Breast Cancer  . S/P radiation therapy 02/04/2013-02/21/2013   Left Breast and Axilla / 46.8 Gy in 26 fractions were planned (she only received 18 Gy in 10 fractions)  . Syncope 07/2019   Review of Systems: A complete ROS was negative except as per HPI.   Physical Exam: Blood pressure (!) 141/92, pulse 84, temperature (!) 97.4 F (36.3 C), temperature source Oral, resp. rate 20, last menstrual period 08/18/2014, SpO2 98 %. Physical Exam Vitals and nursing note reviewed.  Constitutional:      General: She is not in acute distress.    Appearance: She is not ill-appearing.  HENT:     Head: Normocephalic and atraumatic.  Eyes:     General: No scleral icterus.    Conjunctiva/sclera: Conjunctivae normal.  Cardiovascular:     Rate and Rhythm: Normal rate and regular rhythm.     Heart sounds: Murmur present. Systolic murmur present with a grade of 3/6.  Pulmonary:     Effort: Pulmonary effort is normal. No tachypnea or respiratory distress.     Breath sounds: Normal breath sounds. No decreased breath sounds, wheezing, rhonchi or rales.  Abdominal:     General: Abdomen is flat. Bowel sounds are normal.     Palpations: Abdomen is soft.     Tenderness: There is no abdominal tenderness.  Musculoskeletal:        General: Normal range of motion.     Comments: Bilateral +1 LE pitting edema to knee.  Skin:     General: Skin is warm and dry.  Neurological:     General: No focal deficit present.     Mental Status: She is alert and oriented to person, place, and time.  Psychiatric:        Mood and Affect: Mood normal.    EKG: personally reviewed my interpretation is normal rate, sinus rhythm, bigeminy, and LBBB  CXR: personally reviewed my interpretation is without pulmonary vascular congestion, stable cardiomegaly from prior on 10/13/2019, no focal opacity  Assessment & Plan by Problem: Active Problems:   Acute exacerbation of CHF (congestive heart failure) ([facility])  Ms. Skillin is a 49 year  old F with significant PMH of metastatic carcinoid tumor, stage IIB ductal triple negative L breast cancer, HFrEF, nonischemic cardiomyopathy, and obesity, who presents with shortness of breath and a general unwell feeling.   Acute on chronic heart failure exacerbation Pt with acute shortness of breath and progressively worsening peripheral edema in the setting of limited outpatient diuresis secondary to worsening renal function. States her dry weight is around 230, and has weighed up to 237 at home. On lasix 40mg  daily with instructions to increase to BID if gaining weight. However, she has been endorsing decreased urinary output despite an increase in lasix. 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 Marin Olp monthly, and was last seen yesterday on 3/12. Gets  monthly somatuline IM injections for the carcinoid tumor. Infiltrating ductal carcinoma of L breast was diagnosed and treated 8 years ago with chemotherapy. - oncology notes that overall prognosis is related to her CHF - follow-up with oncology in 3 weeks  Diet: heart VTE: enoxparin 40mg  subQ daily IVF: none Code: FULL CODE  Dispo: Admit patient to Observation with expected length of stay less than 2 midnights.  Signed: Ladona Horns, MD 12/20/2019, 5:07 PM  Pager: [phone]

## 2019-12-20 NOTE — ED Provider Notes (Signed)
[facility] EMERGENCY DEPARTMENT Provider Note   CSN: [id number] Arrival date & time: 12/20/19  0630     History Chief Complaint  Patient presents with  . Shortness of Breath    Amber Bautista is a 49 y.o. female.  49yo F w/ extensive PMH below including CHF, metastatic carcinoid tumor, breast cancer, morbid obesity who p/w SOB. SOB started a few hours ago when she was trying to fall asleep. She felt like she couldn't breathe; she felt a sense of urgency of "impending doom" and decided to come to ED. Cardiologist stopped her entresto yesterday. She reports itchiness and nausea. SOB is worse w/ exertion, no associated CP, palpitations, fever, congestion, sore throat, chills, or cough. She reports polydipsia but not much urine production this week.   Of note, she presented to OSH ED on 3/5 w/ abd bloating and leg swelling. Was instructed to increase lasix to BID and her leg swelling has improved since then. However, she states dry weight is ~230lb and she was 237lb yesterday.  The history is provided by the patient.  Shortness of Breath      Past Medical History:  Diagnosis Date  . Abdominal pain 12/17/2014  . Breast cancer ([place]) 07/12/12   Stage IIB Ductal Carcinoma to  Upper Outer Quadrant: Left Breast: Triple Negative  . Cancer ([place]) 03/03/2015   Overview:  in intestine-2007, in liver-2007, and left breast-2013  . Cancer of upper-outer quadrant of female breast ([place]) 08/01/2012   Image guided biopsy Oct., 2013 -    2 cm by MRI, solitary;     IDC; TNBC; positive LVI;      . Carcinoid tumor of intestine 2001   Mets 2007  . Carcinoma of breast treated with adjuvant chemotherapy ([place])    4 cycles of Carboplatin / Taxol  . Chronic congestive heart failure ([place]) 03/17/2019  . Congestive heart failure (CHF) ([place])   . Diarrhea    Sandostatin  . Frequent PVCs 04/01/2019  . Goals of care, counseling/discussion 03/31/2019  . Hypertension   . Metastatic carcinoid tumor  ([place])   . Morbidly obese ([place])   . Personal history of chemotherapy 2013   Left Breast Cancer  . Personal history of radiation therapy 2013   Left Breast Cancer  . S/P radiation therapy 02/04/2013-02/21/2013   Left Breast and Axilla / 46.8 Gy in 26 fractions were planned (she only received 18 Gy in 10 fractions)  . Syncope 07/2019    Patient Active Problem List   Diagnosis Date Noted  . Anal fissure 08/13/2019  . Blood per rectum 08/13/2019  . Metastatic carcinoid tumor to intra-abdominal site ([place]) 08/13/2019  . Primary pancreatic carcinoid tumor 08/13/2019  . Syncope 07/21/2019  . Morbidly obese ([place]) 07/21/2019  . Bigeminy 06/20/2019  . Dilated cardiomyopathy ([place]) 04/02/2019  . Hypokalemia 04/02/2019  . Hypertensive heart disease with heart failure ([place]) 04/02/2019  . Frequent PVCs 04/01/2019  . Goals of care, counseling/discussion 03/31/2019  . Chronic systolic heart failure ([place]) 03/17/2019  . Cancer ([place]) 03/03/2015  . Abdominal pain 12/17/2014  . Breast cancer ([place])   . Cancer of upper-outer quadrant of female breast ([place]) 08/01/2012  . Carcinoid tumor of intestine 03/18/2012    Past Surgical History:  Procedure Laterality Date  . BREAST LUMPECTOMY Left 2013  . BREAST SURGERY  08/23/12   Lt br lumpectomy  . [place] 2007  . COLON SURGERY  2001   Cancer/ Intestinal Resection  . OVARIAN CYST  REMOVAL  2001  . PARTIAL MASTECTOMY WITH NEEDLE LOCALIZATION AND AXILLARY SENTINEL LYMPH NODE BX  08/23/2012   Procedure: PARTIAL MASTECTOMY WITH NEEDLE LOCALIZATION AND AXILLARY SENTINEL LYMPH NODE BX;  Surgeon: Adin Hector, MD;  Location: [facility];  Service: General;  Laterality: Left;  blue dye injection   . PORTACATH PLACEMENT  08/23/2012   Procedure: INSERTION PORT-A-CATH;  Surgeon: Adin Hector, MD;  Location: [facility];  Service: General;  Laterality: Right;  intraop ultrasound     OB History   No obstetric history on file.     Family History    Problem Relation Age of Onset  . Heart attack Father   . Diabetes Father   . Hypertension Mother   . Diabetes Mother   . Glaucoma Mother   . Hypertension Brother   . Asthma Brother   . Cancer Paternal Grandmother   . Colon cancer Neg Hx   . Esophageal cancer Neg Hx   . Rectal cancer Neg Hx   . Stomach cancer Neg Hx     Social History   Tobacco Use  . Smoking status: Never Smoker  . Smokeless tobacco: Never Used  . Tobacco comment: NEVER SMOKED  Substance Use Topics  . Alcohol use: No    Alcohol/week: 0.0 standard drinks  . Drug use: No    Home Medications Prior to Admission medications   Medication Sig Start Date End Date Taking? Authorizing Provider  furosemide (LASIX) 40 MG tablet Take 1 tab by mouth daily if weight is 230 lbs or less, take 2 tabs by mouth daily if weight is 231lbs or greater. 12/17/19   Richardo Priest, MD  magnesium chloride (SLOW-MAG) 64 MG TBEC SR tablet Take 1 tablet (64 mg total) by mouth 2 (two) times daily. Patient taking differently: Take 1 tablet by mouth daily.  06/20/19   Loel Dubonnet, NP  metoprolol succinate (TOPROL-XL) 50 MG 24 hr tablet Take 50 mg by mouth daily. Take with or immediately following a meal.    [provider]  naltrexone (DEPADE) 50 MG tablet Take 0.5 tablets (25 mg total) by mouth daily. 11/20/19   Volanda Napoleon, MD  potassium chloride SA (KLOR-CON) 20 MEQ tablet Take 2 tablets (40 mEq total) by mouth 2 (two) times daily. 10/16/19   Richardo Priest, MD  sacubitril-valsartan (ENTRESTO) 49-51 MG Take 1 tablet by mouth 2 (two) times daily. 08/14/19   Loel Dubonnet, NP  spironolactone (ALDACTONE) 25 MG tablet Take 25 mg by mouth daily.    [provider]    Allergies    Sulfamethoxazole-trimethoprim and Adhesive [tape]  Review of Systems   Review of Systems  Respiratory: Positive for shortness of breath.    All other systems reviewed and are negative except that which was mentioned in  HPI  Physical Exam Updated Vital Signs BP (!) 141/99   Pulse 95   Temp 98.9 F (37.2 C) (Oral)   Resp 14   LMP 08/18/2014 (LMP Unknown) Comment: menopausal: chemo induced  SpO2 98%   Physical Exam Vitals and nursing note reviewed.  Constitutional:      General: She is not in acute distress.    Appearance: She is well-developed.  HENT:     Head: Normocephalic and atraumatic.  Eyes:     Conjunctiva/sclera: Conjunctivae normal.  Cardiovascular:     Rate and Rhythm: Normal rate and regular rhythm.     Heart sounds: Murmur present.  Pulmonary:  Effort: Pulmonary effort is normal.     Comments: Diminished BS b/l bases Abdominal:     General: Bowel sounds are normal. There is no distension.     Palpations: Abdomen is soft.     Tenderness: There is no abdominal tenderness.  Musculoskeletal:     Cervical back: Neck supple.     Right lower leg: Edema present.     Left lower leg: Edema present.     Comments: Mild edema BLE  Skin:    General: Skin is warm and dry.  Neurological:     Mental Status: She is alert and oriented to person, place, and time.     Comments: Fluent speech, normal gait  Psychiatric:        Behavior: Behavior normal.        Judgment: Judgment normal.     ED Results / Procedures / Treatments   Labs (all labs ordered are listed, but only abnormal results are displayed) Labs Reviewed  CBC WITH DIFFERENTIAL/PLATELET - Abnormal; Notable for the following components:      Result Value   HCT 46.1 (*)    RDW 18.4 (*)    nRBC 0.3 (*)    All other components within normal limits  BASIC METABOLIC PANEL  BRAIN NATRIURETIC PEPTIDE  I-STAT BETA HCG BLOOD, ED ([facility], [facility], [facility] ONLY)  TROPONIN I (HIGH SENSITIVITY)  TROPONIN I (HIGH SENSITIVITY)    EKG None  Radiology DG Chest Portable 1 View  Result Date: 12/20/2019 CLINICAL DATA:  Chest pain, shortness of breath EXAM: PORTABLE CHEST 1 VIEW COMPARISON:  10/13/2019 FINDINGS: Lungs are clear.  No pleural  effusion or pneumothorax. The heart is normal in size. IMPRESSION: No evidence of acute cardiopulmonary disease. Electronically Signed   By: Julian Hy M.D.   On: 12/20/2019 07:04    Procedures Procedures (including critical care time)  Medications Ordered in ED Medications - No data to display  ED Course  I have reviewed the triage vital signs and the nursing notes.  Pertinent labs & imaging results that were available during my care of the patient were reviewed by me and considered in my medical decision making (see chart for details).    MDM Rules/Calculators/A&P                     Non-toxic on exam, no resp distress, hypertensive but normal O2 sat. DDx includes CHF exacerbation, pneumonia, PE, ACS. EKG without ischemic changes.   Labs show reassuring CBC, potassium 3.7, creatinine has been climbing over the past month and is now 2.6 today.  Troponin is 29 which is similar to recent values.  BNP 382 which is the highest on her chart.  I do suspect a component of volume overload based on her history, the fact that she is up on her weight, and climbing creatinine. Gave IV lasix. I have consulted cardiology, Dr. Caryl Comes, and they will see pt in consultation. Discussed admission w/ Internal Med, Molli Hazard, and pt admitted for further care.  Final Clinical Impression(s) / ED Diagnoses Final diagnoses:  None    Rx / DC Orders ED Discharge Orders    None       Larrissa Stivers, Wenda Overland, MD 12/20/19 [phone]

## 2019-12-20 NOTE — ED Notes (Signed)
Pt came to the ED via EMS. Pt conscious, breathing, and A&Ox4. Pt endorses "I have shortness of breath". Chest rise and fall equally with non-labored breathing. Lungs clear apex to base. Abd soft and non-tender. Pt denies chest pain, n/v/d, shortness of breath, and f/c. PIVC placed on the right hand with a 20G which had positive blood return and flushed without pain or infiltration. Blood collected, labeled, and sent to lab. Bed in lowest position with call light within reach. Pt on continuous blood pressure, pulse ox, and cardiac monitor. Will continue to monitor. Awaiting MD eval. No distress noted.

## 2019-12-20 NOTE — Consult Note (Addendum)
Cardiology Consultation:   Patient ID: LAZARIA SCHABEN; [id number]; March 22, 1971   Admit date: 12/20/2019 Date of Consult: 12/20/2019  Primary Care Provider: Jolinda Croak, MD Primary Cardiologist: Dr. Shirlee More, MD Primary Electrophysiologist: Dr. Allegra Lai, MD  Patient Profile:   MALANA EBERWEIN is a 49 y.o. 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 Sharon Seller.  History of Present Illness:   Ms. Dolby is a 49 year old female with a history stated above who presented to [facility] on 12/20/2019 with acute onset of shortness of breath which began yesterday evening while she was attempting to fall asleep.  Patient reports she was unable to take a deep breath and felt a sense of impending doom therefore she decided to proceed to the ED for further evaluation. Patient reports of Dr. Bettina Gavia stopped her [facility] yesterday given her most recent creatinine function at 2.60.   In the ED, EKG without acute changes from prior tracing however telemetry shows frequent bigeminy with short bursts of NSVT. Creatinine continues to be elevated at 2.66 today. HST found to be 29 however similar to prior troponin levels. Given no complaints of chest pain with comparable HST levels, low suspicion for ACS.  BNP reduced to 382 from reported the level of 7247 (was 5909 on 12/17/2019) at most recent ED evaluation 12/12/2019 at [place]. CXR with no evidence of acute cardiopulmonary disease.  Patient is typically followed by Dr. Bettina Gavia for her cardiology care, last seen 12/17/2019 in follow-up.  In chart review, she was seen at [place] ED 12/12/2019 for heart failure symptoms with close follow-up with Dr. Bettina Gavia as above.  During that ED presentation, CXR showed no evidence of  infiltrate or pulmonary vascular congestion. EKG was stable with known LBBB and frequent PVCs. ProBNP was severely elevated at 7247. Since her ED evaluation, her diuretic was doubled from 40 mg p.o. daily to 40 mg p.o. twice daily and her weight was close to baseline at 233lb. She had no shortness of breath and less edema with no chest pain, palpitations or syncope. Plan at that time was to pursue a weight-based approach to her heart failure regimen. She was continued on Lasix 40 mg daily for a weight of 230lb with instructions to take an additional dose for elevated weight. There was clinical concern that her edema was related to both her heart disease and possibly liver involvement with her malignancy. Patient's last echocardiogram from 05/23/2019 showed improvement in EF at 45 to 50%.  ZIO monitor performed 04/07/2019 showed frequent PVCs with a 7.4% burden with less than 1% incident of couplets and triplets.  She is been seen in consultation by EP with recommendations to acute therapy due to low PVC burden.  Cardiology has been asked to evaluate on this hospital admission due to progressive CHF symptoms.  On my exam, patient reports that her lower extremity edema has improved.  She does report a 1 week period of decreased urinary output despite Lasix therapy.  Has been relatively stable since last ED discharge.  Does report that her weight has been fluctuating greatly from 230lb up to her most recent max weight at 237lb.  She denies chest pain, PND, orthopnea, dizziness or syncope.  Past Medical History:  Diagnosis Date  . Abdominal pain 12/17/2014  . Breast  cancer ([place]) 07/12/12   Stage IIB Ductal Carcinoma to  Upper Outer Quadrant: Left Breast: Triple Negative  . Cancer ([place]) 03/03/2015   Overview:  in intestine-2007, in liver-2007, and left breast-2013  . Cancer of upper-outer quadrant of female breast ([place]) 08/01/2012   Image guided biopsy Oct., 2013 -    2 cm by MRI, solitary;     IDC; TNBC; positive  LVI;      . Carcinoid tumor of intestine 2001   Mets 2007  . Carcinoma of breast treated with adjuvant chemotherapy ([place])    4 cycles of Carboplatin / Taxol  . Chronic congestive heart failure ([place]) 03/17/2019  . Congestive heart failure (CHF) ([place])   . Diarrhea    Sandostatin  . Frequent PVCs 04/01/2019  . Goals of care, counseling/discussion 03/31/2019  . Hypertension   . Metastatic carcinoid tumor ([place])   . Morbidly obese ([place])   . Personal history of chemotherapy 2013   Left Breast Cancer  . Personal history of radiation therapy 2013   Left Breast Cancer  . S/P radiation therapy 02/04/2013-02/21/2013   Left Breast and Axilla / 46.8 Gy in 26 fractions were planned (she only received 18 Gy in 10 fractions)  . Syncope 07/2019    Past Surgical History:  Procedure Laterality Date  . BREAST LUMPECTOMY Left 2013  . BREAST SURGERY  08/23/12   Lt br lumpectomy  . [place] 2007  . COLON SURGERY  2001   Cancer/ Intestinal Resection  . OVARIAN CYST REMOVAL  2001  . PARTIAL MASTECTOMY WITH NEEDLE LOCALIZATION AND AXILLARY SENTINEL LYMPH NODE BX  08/23/2012   Procedure: PARTIAL MASTECTOMY WITH NEEDLE LOCALIZATION AND AXILLARY SENTINEL LYMPH NODE BX;  Surgeon: Adin Hector, MD;  Location: [place];  Service: General;  Laterality: Left;  blue dye injection   . PORTACATH PLACEMENT  08/23/2012   Procedure: INSERTION PORT-A-CATH;  Surgeon: Adin Hector, MD;  Location: [place];  Service: General;  Laterality: Right;  intraop ultrasound     Prior to Admission medications   Medication Sig Start Date End Date Taking? Authorizing Provider  furosemide (LASIX) 40 MG tablet Take 1 tab by mouth daily if weight is 230 lbs or less, take 2 tabs by mouth daily if weight is 231lbs or greater. 12/17/19   Richardo Priest, MD  magnesium chloride (SLOW-MAG) 64 MG TBEC SR tablet Take 1 tablet (64 mg total) by mouth 2 (two) times daily. Patient taking differently: Take 1 tablet by mouth daily.   06/20/19   Loel Dubonnet, NP  metoprolol succinate (TOPROL-XL) 50 MG 24 hr tablet Take 50 mg by mouth daily. Take with or immediately following a meal.    [provider]  naltrexone (DEPADE) 50 MG tablet Take 0.5 tablets (25 mg total) by mouth daily. 11/20/19   Volanda Napoleon, MD  potassium chloride SA (KLOR-CON) 20 MEQ tablet Take 2 tablets (40 mEq total) by mouth 2 (two) times daily. 10/16/19   Richardo Priest, MD  sacubitril-valsartan (ENTRESTO) 49-51 MG Take 1 tablet by mouth 2 (two) times daily. 08/14/19   Loel Dubonnet, NP  spironolactone (ALDACTONE) 25 MG tablet Take 25 mg by mouth daily.    [provider]    Inpatient Medications: Scheduled Meds: . furosemide  40 mg Intravenous STAT   Continuous Infusions: . magnesium sulfate bolus IVPB     PRN Meds:   Allergies:    Allergies  Allergen Reactions  . Sulfamethoxazole-Trimethoprim Swelling  and Hypertension    Swelling and hypertension after taking, was admitted to hospital  . Adhesive [Tape] Rash and Other (See Comments)    Causes SEVERE RASHES AND BLISTERS    Social History:   Social History   Socioeconomic History  . Marital status: Divorced    Spouse name: Not on file  . Number of children: Not on file  . Years of education: Not on file  . Highest education level: Not on file  Occupational History  . Not on file  Tobacco Use  . Smoking status: Never Smoker  . Smokeless tobacco: Never Used  . Tobacco comment: NEVER SMOKED  Substance and Sexual Activity  . Alcohol use: No    Alcohol/week: 0.0 standard drinks  . Drug use: No  . Sexual activity: Not Currently    Comment: menarche age 3, P 3, G 1, 1 misscarriage  Other Topics Concern  . Not on file  Social History Narrative  . Not on file   Social Determinants of Health   Financial Resource Strain:   . Difficulty of Paying Living Expenses:   Food Insecurity:   . Worried About Charity fundraiser in the Last Year:   . Academic librarian in the Last Year:   Transportation Needs:   . Film/video editor (Medical):   Marland Kitchen Lack of Transportation (Non-Medical):   Physical Activity:   . Days of Exercise per Week:   . Minutes of Exercise per Session:   Stress:   . Feeling of Stress :   Social Connections:   . Frequency of Communication with Friends and Family:   . Frequency of Social Gatherings with Friends and Family:   . Attends Religious Services:   . Active Member of Clubs or Organizations:   . Attends Archivist Meetings:   Marland Kitchen Marital Status:   Intimate Partner Violence:   . Fear of Current or Ex-Partner:   . Emotionally Abused:   Marland Kitchen Physically Abused:   . Sexually Abused:     Family History:   Family History  Problem Relation Age of Onset  . Heart attack Father   . Diabetes Father   . Hypertension Mother   . Diabetes Mother   . Glaucoma Mother   . Hypertension Brother   . Asthma Brother   . Cancer Paternal Grandmother   . Colon cancer Neg Hx   . Esophageal cancer Neg Hx   . Rectal cancer Neg Hx   . Stomach cancer Neg Hx    Family Status:  Family Status  Relation Name Status  . Father  Deceased       01-09-00, MI  . Mother  Alive  . Brother 2 Alive  . PGM  (Not Specified)  . Neg Hx  (Not Specified)    ROS:  Please see the history of present illness.  All other ROS reviewed and negative.     Physical Exam/Data:   Vitals:   12/20/19 0659 12/20/19 0730 12/20/19 0745 12/20/19 0825  BP: (!) 140/111 113/69 (!) 141/99 (!) 149/102  Pulse: 94 94 95 93  Resp: 18 13 14 15   Temp: 98.9 F (37.2 C)     TempSrc: Oral     SpO2: 98% 99% 98% 95%   No intake or output data in the 24 hours ending 12/20/19 1034 There were no vitals filed for this visit. There is no height or weight on file to calculate BMI.   General: Overweight, NAD Skin: Warm, dry,  intact  Neck: Negative for carotid bruits. No JVD Lungs:Clear to ausculation bilaterally. No wheezes, rales, or rhonchi. Breathing is  unlabored. Cardiovascular: RRR with S1 S2. No murmurs.  Abdomen: Soft, non-tender, non-distended. No obvious abdominal masses. Extremities: 2+ BLE edema. DP/PT pulses 2+ bilaterally Neuro: Alert and oriented. No focal deficits. No facial asymmetry. MAE spontaneously. Psych: Responds to questions appropriately with normal affect.     EKG:  The EKG was personally reviewed and demonstrates: 12/20/2019 NSR with known LBBB, bigeminy tracing, HR 93 bpm Telemetry:  Telemetry was personally reviewed and demonstrates: 12/20/2019 NSR with bigeminy, short bursts of NSVT  Relevant CV Studies:  TTE 05/28/19  Review of the above records today demonstrates:  1. Mild hypokinesis of the left ventricular, entire septal wall. 2. The left ventricle has mildly reduced systolic function, with an ejection fraction of 45-50%. The cavity size was normal. There is moderately increased left ventricular wall thickness. Left ventricular diastolic Doppler parameters are consistent with impaired relaxation. 3. The right ventricle has normal systolic function. The cavity was normal. There is no increase in right ventricular wall thickness. 4. Right atrial size was mildly dilated. 5. Tricuspid valve regurgitation is moderate-severe. 6. Mild pulmonic stenosis. 7. The aorta is normal unless otherwise noted. 8. The inferior vena cava was normal in size with <50% respiratory variability. 9. TDS, cinsider MRI for better assesment of RV and TR  Zio 05/02/19  The rhythm was sinus throughout the recording with minimum average and maximum heart rates of 58, 93 and 131 bpm. Ventricular ectopy was frequent 7.4% incidence of isolated PVCs less than 1% incidence of couplets and triplets. The longest episode of bigeminy 23 minutes 28 seconds in the longest episode of trigeminy 1 minute 26 seconds.  Laboratory Data:  Chemistry Recent Labs  Lab 12/17/19 1532 12/19/19 1344 12/20/19 0700  NA 144 138 138  K 3.0* 2.8* 3.7   CL 100 101 102  CO2 21 25 21*  GLUCOSE 105* 140* 122*  BUN 29* 34* 32*  CREATININE 2.60* 2.51* 2.66*  CALCIUM 9.6 9.2 9.0  GFRNONAA 21* 22* 20*  GFRAA 24* 25* 23*  ANIONGAP  --  12 15    Total Protein  Date Value Ref Range Status  12/20/2019 6.9 6.5 - 8.1 g/dL Final  11/14/2012 8.1 6.4 - 8.1 g/dL Final   Albumin  Date Value Ref Range Status  12/20/2019 3.5 3.5 - 5.0 g/dL Final  11/14/2012 3.1 (L) 3.3 - 5.5 g/dL Final   AST  Date Value Ref Range Status  12/20/2019 19 15 - 41 U/L Final  12/19/2019 14 (L) 15 - 41 U/L Final   ALT  Date Value Ref Range Status  12/20/2019 16 0 - 44 U/L Final  12/19/2019 12 0 - 44 U/L Final   ALT(SGPT)  Date Value Ref Range Status  11/14/2012 20 10 - 47 U/L Final   Alkaline Phosphatase  Date Value Ref Range Status  12/20/2019 95 38 - 126 U/L Final  11/14/2012 61 26 - 84 U/L Final   Total Bilirubin  Date Value Ref Range Status  12/20/2019 1.3 (H) 0.3 - 1.2 mg/dL Final  12/19/2019 0.9 0.3 - 1.2 mg/dL Final   Hematology Recent Labs  Lab 12/19/19 1344 12/20/19 0700  WBC 6.1 6.6  RBC 4.90 5.08  HGB 13.6 14.0  HCT 43.6 46.1*  MCV 89.0 90.7  MCH 27.8 27.6  MCHC 31.2 30.4  RDW 18.1* 18.4*  PLT 219 217   Cardiac EnzymesNo results for input(s):  TROPONINI in the last 168 hours. No results for input(s): TROPIPOC in the last 168 hours.  BNP Recent Labs  Lab 12/17/19 1532 12/20/19 0700  BNP  --  382.1*  PROBNP 5,909*  --     DDimer No results for input(s): DDIMER in the last 168 hours. TSH:  Lab Results  Component Value Date   TSH 3.851 11/20/2019   Lipids:No results found for: CHOL, HDL, LDLCALC, LDLDIRECT, TRIG, CHOLHDL HgbA1c:No results found for: HGBA1C  Radiology/Studies:  DG Chest Portable 1 View  Result Date: 12/20/2019 CLINICAL DATA:  Chest pain, shortness of breath EXAM: PORTABLE CHEST 1 VIEW COMPARISON:  10/13/2019 FINDINGS: Lungs are clear.  No pleural effusion or pneumothorax. The heart is normal in size.  IMPRESSION: No evidence of acute cardiopulmonary disease. Electronically Signed   By: Julian Hy M.D.   On: 12/20/2019 07:04    Assessment and Plan:   1.  Dilated nonischemic cardiomyopathy: -Previous echocardiogram from 05/23/2019 with EF at 45 to 50% improved from 25 to 30% in 2013 with impaired LV relaxation, RV normal function/size and moderate TR -Patient presented with acute onset of shortness of breath however has been having more recent issues with HF symptoms including weight changes and LE edema.  Was recently seen at [place] with an elevated proBNP treated with up titration of p.o. Lasix from 40 mg daily to 40 mg twice daily with some improvement.  Was recently seen by her primary cardiologist 12/17/2019 at which time she was continued on this regimen based on a weight of 230lb with plans to take an additional dose for weight increases.  Patient states that since that time, her weight has been fluctuating greatly from 230 to 237lb.  Creatinine has been elevated as well therefore her Delene Loll was discontinued secondary to this by Dr. Joya Gaskins office. -Plan IV Lasix challenge and follow kidney function closely given poor outpatient urine output.  If significant increase in creatinine, will likely need nephrology consultation versus AHF for further management -Repeat echocardiogram with pending results -Strict I&O; daily weights -Currently stable with no acute signs of shortness of breath.  Does have lower extremity pitting edema.  2.  Hypertensive heart disease: -Stable, 134/97, 117/84, 156/46, 149/102 -Continue Toprol XL 50 mg  3.  Moderate to severe tricuspid regurgitation: -Noted on most recent echocardiogram from 05/2019 -Continue to follow -Echocardiogram this admission, pending  4.  Bigeminy/PVCs: -Has history of frequent PVCs/bigeminy -More Zio patch monitor 05/02/2019 was 7.4% incident of isolated PVCs, longest episode of bigeminy 23 minutes and 28 seconds, trigeminy  1 minute 26 seconds.  -Has been seen in the outpatient setting by EP with no further recommendations given low PVC burden at that time -Check magnesium level, keep Mg+ greater than 2.0 and K+ level greater than 4.0 -Continue beta-blocker therapy   For questions or updates, please contact [facility] Please consult [email or web address] for contact info under Cardiology/STEMI.   Lyndel Safe NP-C [place] Pager: [phone] 12/20/2019 10:34 AM  Patient seen and examined with Kathyrn Drown NP-C.  Agree as above, with the following exceptions and changes as noted below.  Patient has been working closely with my partner Dr. Shirlee More for Lasix adjustments.  She feels she has not been able to put out urine at home over the last several days and that oral diuretics were not helping more recently.  She presented today with acute onset shortness of breath which began yesterday evening when she was attempting to fall asleep.  She was unable  to take a deep breath and felt a sense of impending doom.  She has more recently had worsening of her renal function with a creatinine of 2.6 up from a baseline of approximately 1.3 late last year.  Her history includes nonischemic cardiomyopathy, left bundle branch block, heart failure with reduced ejection fraction, frequent PVCs, breast cancer, metastatic liver carcinoid.  I have independently reviewed the images from her echocardiogram performed in August 2020.  There is likely severe tricuspid valve regurgitation and moderate pulmonary regurgitation.  Pulmonary valve is not well visualized however the tricuspid valve appears to have a fixed and retracted appearance at least in the septal leaflet.  These findings are highly suggestive of carcinoid valvular disease.  No definite involvement of the left-sided heart valves, no definite interatrial shunt.  Gen: NAD, CV: RRR, 3/6 holosystolic murmur along the left sternal border, Lungs: clear, Abd: soft, skin: Tiny  raised red papules, excoriations.  Extrem: Warm, well perfused, 1+ edema pretibial and sacral, Neuro/Psych: alert and oriented x 3, normal mood and affect. All available labs, radiology testing, previous records reviewed.   She is overall accepting of her medical conditions and requests that she is comfortable and symptom-free.  We will certainly attempt to get her breathing more comfortable.  With creatinine of 2.6 and decreased urine output at home, we will use a Lasix challenge.  If she has worsening of renal function or no increased urine output, would recommend involving nephrology. As noted in prior consultation notes, echo features are consistent with carcinoid heart disease.    - I will order lasix 120 mg IV as a lasix challenge. She has had 40 mg IV already and began experiencing diuresis. If here renal dysfunction is due to right heart failure, hopefully we will begin to decongest.  - Will repeat echo and follow up assessment of right heart size and function and right sided heart valves in the setting of carcinoid heart disease. - will increase metoprolol dose due to frequent PVCs and will replete potassium today.  Elouise Munroe 12/20/19 2:23 PM

## 2019-12-21 ENCOUNTER — Inpatient Hospital Stay: Payer: Self-pay

## 2019-12-21 DIAGNOSIS — C50912 Malignant neoplasm of unspecified site of left female breast: Secondary | ICD-10-CM

## 2019-12-21 DIAGNOSIS — N184 Chronic kidney disease, stage 4 (severe): Secondary | ICD-10-CM | POA: Diagnosis present

## 2019-12-21 DIAGNOSIS — I447 Left bundle-branch block, unspecified: Secondary | ICD-10-CM | POA: Diagnosis present

## 2019-12-21 DIAGNOSIS — I130 Hypertensive heart and chronic kidney disease with heart failure and stage 1 through stage 4 chronic kidney disease, or unspecified chronic kidney disease: Secondary | ICD-10-CM | POA: Diagnosis present

## 2019-12-21 DIAGNOSIS — I420 Dilated cardiomyopathy: Secondary | ICD-10-CM | POA: Diagnosis present

## 2019-12-21 DIAGNOSIS — I5084 End stage heart failure: Secondary | ICD-10-CM | POA: Diagnosis present

## 2019-12-21 DIAGNOSIS — Z20822 Contact with and (suspected) exposure to covid-19: Secondary | ICD-10-CM | POA: Diagnosis present

## 2019-12-21 DIAGNOSIS — I5043 Acute on chronic combined systolic (congestive) and diastolic (congestive) heart failure: Secondary | ICD-10-CM | POA: Diagnosis present

## 2019-12-21 DIAGNOSIS — Z66 Do not resuscitate: Secondary | ICD-10-CM | POA: Diagnosis not present

## 2019-12-21 DIAGNOSIS — I372 Nonrheumatic pulmonary valve stenosis with insufficiency: Secondary | ICD-10-CM | POA: Diagnosis present

## 2019-12-21 DIAGNOSIS — E340 Carcinoid syndrome: Secondary | ICD-10-CM | POA: Diagnosis present

## 2019-12-21 DIAGNOSIS — E876 Hypokalemia: Secondary | ICD-10-CM | POA: Diagnosis present

## 2019-12-21 DIAGNOSIS — Z171 Estrogen receptor negative status [ER-]: Secondary | ICD-10-CM | POA: Diagnosis not present

## 2019-12-21 DIAGNOSIS — I4581 Long QT syndrome: Secondary | ICD-10-CM | POA: Diagnosis not present

## 2019-12-21 DIAGNOSIS — I5082 Biventricular heart failure: Secondary | ICD-10-CM | POA: Diagnosis present

## 2019-12-21 DIAGNOSIS — Z853 Personal history of malignant neoplasm of breast: Secondary | ICD-10-CM | POA: Diagnosis not present

## 2019-12-21 DIAGNOSIS — T451X5S Adverse effect of antineoplastic and immunosuppressive drugs, sequela: Secondary | ICD-10-CM | POA: Diagnosis not present

## 2019-12-21 DIAGNOSIS — I072 Rheumatic tricuspid stenosis and insufficiency: Secondary | ICD-10-CM

## 2019-12-21 DIAGNOSIS — I5089 Other heart failure: Secondary | ICD-10-CM | POA: Diagnosis not present

## 2019-12-21 DIAGNOSIS — E669 Obesity, unspecified: Secondary | ICD-10-CM

## 2019-12-21 DIAGNOSIS — I361 Nonrheumatic tricuspid (valve) insufficiency: Secondary | ICD-10-CM | POA: Diagnosis present

## 2019-12-21 DIAGNOSIS — I5023 Acute on chronic systolic (congestive) heart failure: Secondary | ICD-10-CM | POA: Diagnosis present

## 2019-12-21 DIAGNOSIS — I5189 Other ill-defined heart diseases: Secondary | ICD-10-CM | POA: Diagnosis not present

## 2019-12-21 DIAGNOSIS — I071 Rheumatic tricuspid insufficiency: Secondary | ICD-10-CM | POA: Diagnosis not present

## 2019-12-21 DIAGNOSIS — I5021 Acute systolic (congestive) heart failure: Secondary | ICD-10-CM | POA: Diagnosis not present

## 2019-12-21 DIAGNOSIS — I493 Ventricular premature depolarization: Secondary | ICD-10-CM | POA: Diagnosis present

## 2019-12-21 DIAGNOSIS — C787 Secondary malignant neoplasm of liver and intrahepatic bile duct: Secondary | ICD-10-CM | POA: Diagnosis not present

## 2019-12-21 DIAGNOSIS — I427 Cardiomyopathy due to drug and external agent: Secondary | ICD-10-CM | POA: Diagnosis present

## 2019-12-21 DIAGNOSIS — I50813 Acute on chronic right heart failure: Secondary | ICD-10-CM | POA: Diagnosis not present

## 2019-12-21 DIAGNOSIS — T451X5A Adverse effect of antineoplastic and immunosuppressive drugs, initial encounter: Secondary | ICD-10-CM | POA: Diagnosis present

## 2019-12-21 DIAGNOSIS — C7B02 Secondary carcinoid tumors of liver: Secondary | ICD-10-CM | POA: Diagnosis present

## 2019-12-21 DIAGNOSIS — N179 Acute kidney failure, unspecified: Secondary | ICD-10-CM | POA: Diagnosis present

## 2019-12-21 DIAGNOSIS — I472 Ventricular tachycardia: Secondary | ICD-10-CM | POA: Diagnosis present

## 2019-12-21 DIAGNOSIS — R011 Cardiac murmur, unspecified: Secondary | ICD-10-CM

## 2019-12-21 DIAGNOSIS — C7A098 Malignant carcinoid tumors of other sites: Secondary | ICD-10-CM | POA: Diagnosis not present

## 2019-12-21 DIAGNOSIS — I428 Other cardiomyopathies: Secondary | ICD-10-CM | POA: Diagnosis not present

## 2019-12-21 DIAGNOSIS — Z515 Encounter for palliative care: Secondary | ICD-10-CM | POA: Diagnosis not present

## 2019-12-21 LAB — CBC WITH DIFFERENTIAL/PLATELET
Abs Immature Granulocytes: 0.06 10*3/uL (ref 0.00–0.07)
Basophils Absolute: 0 10*3/uL (ref 0.0–0.1)
Basophils Relative: 0 %
Eosinophils Absolute: 0.1 10*3/uL (ref 0.0–0.5)
Eosinophils Relative: 2 %
HCT: 43.6 % (ref 36.0–46.0)
Hemoglobin: 13.4 g/dL (ref 12.0–15.0)
Immature Granulocytes: 1 %
Lymphocytes Relative: 45 %
Lymphs Abs: 2.5 10*3/uL (ref 0.7–4.0)
MCH: 27.7 pg (ref 26.0–34.0)
MCHC: 30.7 g/dL (ref 30.0–36.0)
MCV: 90.1 fL (ref 80.0–100.0)
Monocytes Absolute: 0.1 10*3/uL (ref 0.1–1.0)
Monocytes Relative: 1 %
Neutro Abs: 2.7 10*3/uL (ref 1.7–7.7)
Neutrophils Relative %: 51 %
Platelets: 205 10*3/uL (ref 150–400)
RBC: 4.84 MIL/uL (ref 3.87–5.11)
RDW: 18.3 % — ABNORMAL HIGH (ref 11.5–15.5)
WBC: 5.5 10*3/uL (ref 4.0–10.5)
nRBC: 0 % (ref 0.0–0.2)

## 2019-12-21 LAB — MAGNESIUM: Magnesium: 1.8 mg/dL (ref 1.7–2.4)

## 2019-12-21 LAB — TSH: TSH: 3.218 u[IU]/mL (ref 0.350–4.500)

## 2019-12-21 LAB — COMPREHENSIVE METABOLIC PANEL
ALT: 17 U/L (ref 0–44)
AST: 19 U/L (ref 15–41)
Albumin: 3.3 g/dL — ABNORMAL LOW (ref 3.5–5.0)
Alkaline Phosphatase: 94 U/L (ref 38–126)
Anion gap: 12 (ref 5–15)
BUN: 31 mg/dL — ABNORMAL HIGH (ref 6–20)
CO2: 28 mmol/L (ref 22–32)
Calcium: 8.8 mg/dL — ABNORMAL LOW (ref 8.9–10.3)
Chloride: 99 mmol/L (ref 98–111)
Creatinine, Ser: 2.73 mg/dL — ABNORMAL HIGH (ref 0.44–1.00)
GFR calc Af Amer: 23 mL/min — ABNORMAL LOW (ref >60)
GFR calc non Af Amer: 20 mL/min — ABNORMAL LOW (ref >60)
Glucose, Bld: 109 mg/dL — ABNORMAL HIGH (ref 70–99)
Potassium: 3 mmol/L — ABNORMAL LOW (ref 3.5–5.1)
Sodium: 139 mmol/L (ref 135–145)
Total Bilirubin: 1.3 mg/dL — ABNORMAL HIGH (ref 0.3–1.2)
Total Protein: 6.4 g/dL — ABNORMAL LOW (ref 6.5–8.1)

## 2019-12-21 LAB — PHOSPHORUS: Phosphorus: 4.1 mg/dL (ref 2.5–4.6)

## 2019-12-21 LAB — ECHOCARDIOGRAM COMPLETE

## 2019-12-21 MED ORDER — AMIODARONE HCL 200 MG PO TABS
400.0000 mg | ORAL_TABLET | Freq: Two times a day (BID) | ORAL | Status: DC
  Administered 2019-12-21 – 2019-12-26 (×11): 400 mg via ORAL
  Filled 2019-12-21 (×12): qty 2

## 2019-12-21 MED ORDER — POTASSIUM CHLORIDE CRYS ER 20 MEQ PO TBCR
40.0000 meq | EXTENDED_RELEASE_TABLET | Freq: Once | ORAL | Status: AC
  Administered 2019-12-21: 40 meq via ORAL
  Filled 2019-12-21: qty 2

## 2019-12-21 MED ORDER — POTASSIUM CHLORIDE CRYS ER 20 MEQ PO TBCR
40.0000 meq | EXTENDED_RELEASE_TABLET | Freq: Two times a day (BID) | ORAL | Status: AC
  Administered 2019-12-21 – 2019-12-22 (×4): 40 meq via ORAL
  Filled 2019-12-21 (×4): qty 2

## 2019-12-21 MED ORDER — MAGNESIUM SULFATE 2 GM/50ML IV SOLN
2.0000 g | Freq: Once | INTRAVENOUS | Status: AC
  Administered 2019-12-21: 2 g via INTRAVENOUS
  Filled 2019-12-21: qty 50

## 2019-12-21 MED ORDER — FUROSEMIDE 10 MG/ML IJ SOLN
80.0000 mg | Freq: Two times a day (BID) | INTRAMUSCULAR | Status: DC
  Administered 2019-12-21 – 2019-12-25 (×8): 80 mg via INTRAVENOUS
  Filled 2019-12-21 (×9): qty 8

## 2019-12-21 NOTE — Progress Notes (Signed)
Orthopedic Tech Progress Note Patient Details:  Amber Bautista [birth year] [id number]  Ortho Devices Type of Ortho Device: Haematologist Ortho Device/Splint Location: bilateral legs Ortho Device/Splint Interventions: Ordered, Application   Post Interventions Patient Tolerated: Well Instructions Provided: Care of device, Adjustment of device   Janit Pagan 12/21/2019, 7:59 PM

## 2019-12-21 NOTE — Progress Notes (Signed)
Pt does not have IV access. Unable to give pt scheduled IV meds at this time. Will give when a new IV is obtained.

## 2019-12-21 NOTE — Consult Note (Addendum)
Advanced Heart Failure Team Consult Note   Primary Physician: Jolinda Croak, MD PCP-Cardiologist:  Shirlee More, MD   Referring MD: Margaretann Loveless  Reason for Consultation: HF  HPI:    Amber Bautista is a 49 y.o. 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 Margaretann Loveless  Patient is typically followed by Dr. Bettina Gavia for her cardiology care. There is an echo on the chart from 11/13 which shows EF 25-30%. Apparently had cath at that time in [place] and coronaries were normal. TTE done Aug 2020 showed EF 45-50%, with septal hypokinesis consistent w/ her known LBBB, mod-severe TR  Has had a long history of frequent PVCs. Had Zio Patch performed 04/07/2019 showed frequent PVCs with a 7.4% burden with less than 1% incident of couplets and triplets. She was admitted to the hospital October 2020.  She was admitted for multiple reasons, but had had an episode of syncope. Seh was seen by EP ([facility]) and it was thought that the syncope was vagal in nature.  She was noted to have high burden of PVCs while in the hospital.  It was thought that her PVCs were not contributing to her heart failure as her ejection fraction had actually improved to 45% from 25 to 30% previously. She saw Dr. Curt Bears as outpatient in 10/20 who concurred   She last saw Dr. Bettina Gavia on 12/17/2019 in follow-up. Diuretic had been recently increased at [facility] ED 12/12/2019 and seemed to be doing well but Entresto stopped due to worsening renal function with Cr 2.60.   Presented to [facility] on 12/20/2019 with acute onset of shortness of breath and sense of impending doom.  In the ED, EKG with frequent PVCs. CXR with no evidence of acute cardiopulmonary disease. BNP down   Reports  dyspnea with any activity and severe fatigue. Often at night feels her heart beating hard. No CP. + severe edema.   Seen by Dr. Elissa Lovett and repeat echo done 12/20/19  EF 20% with severely dilated LV with septal DK. RV normal. TV thickened and restricted with wide-open TR.   From oncology standpoint she has been following with Dr. Marin Olp for h/o breast CA and metastatic carcinoid tumor. Remains on somatuline injections for her carcinoid. Recently felt to have possible recurrent of her breast cancer but biopsy in 2/21 negative for malignancy.    Review of Systems: [y] = yes, [ ]  = no   . General: Weight gain Blue.Reese ]; Weight loss [ ] ; Anorexia [ ] ; Fatigue [ y]; Fever [ ] ; Chills [ ] ; Weakness [ ]   . Cardiac: Chest pain/pressure [ ] ; Resting SOB Blue.Reese ]; Exertional SOB Blue.Reese ]; Orthopnea [ ] ; Pedal Edema Blue.Reese ]; Palpitations Blue.Reese ]; Syncope [ ] ; Presyncope [ ] ; Paroxysmal nocturnal dyspnea[y ]  . Pulmonary: Cough [ ] ; Wheezing[ ] ; Hemoptysis[ ] ; Sputum [ ] ; Snoring [ ]   . GI: Vomiting[ ] ; Dysphagia[ ] ; Melena[ ] ; Hematochezia [ ] ; Heartburn[ ] ; Abdominal pain [ ] ; Constipation [ ] ; Diarrhea [ y]; BRBPR [ ]   . GU: Hematuria[ ] ; Dysuria [ ] ; Nocturia[ ]   . Vascular: Pain in legs with walking [ ] ; Pain in feet with lying flat [ ] ; Non-healing sores [ ] ; Stroke [ ] ; TIA [ ] ; Slurred  speech [ ] ;  . Neuro: Headaches[ y]; Vertigo[ ] ; Seizures[ ] ; Paresthesias[ ] ;Blurred vision [ ] ; Diplopia [ ] ; Vision changes [ ]   . Ortho/Skin: Arthritis Blue.Reese ]; Joint pain [ y]; Muscle pain [ ] ; Joint swelling [ ] ; Back Pain [ ] ; Rash [ ]   . Psych: Depression[y ]; Anxiety[ ]   . Heme: Bleeding problems [ ] ; Clotting disorders [ ] ; Anemia [ ]   . Endocrine: Diabetes [ ] ; Thyroid dysfunction[ ]   Home Medications Prior to Admission medications   Medication Sig Start Date End Date Taking? Authorizing Provider  calcium carbonate (TUMS - DOSED IN MG ELEMENTAL CALCIUM) 500 MG chewable tablet Chew 1 tablet by mouth daily as needed  for indigestion or heartburn.   Yes [provider]  furosemide (LASIX) 40 MG tablet Take 1 tab by mouth daily if weight is 230 lbs or less, take 2 tabs by mouth daily if weight is 231lbs or greater. 12/17/19  Yes Richardo Priest, MD  magnesium chloride (SLOW-MAG) 64 MG TBEC SR tablet Take 1 tablet (64 mg total) by mouth 2 (two) times daily. Patient taking differently: Take 1 tablet by mouth daily.  06/20/19  Yes Loel Dubonnet, NP  metoprolol succinate (TOPROL-XL) 50 MG 24 hr tablet Take 50 mg by mouth daily. Take with or immediately following a meal.   Yes [provider]  potassium chloride SA (KLOR-CON) 20 MEQ tablet Take 2 tablets (40 mEq total) by mouth 2 (two) times daily. 10/16/19  Yes Richardo Priest, MD  sacubitril-valsartan (ENTRESTO) 49-51 MG Take 1 tablet by mouth 2 (two) times daily. 08/14/19  Yes Loel Dubonnet, NP  naltrexone (DEPADE) 50 MG tablet Take 0.5 tablets (25 mg total) by mouth daily. Patient not taking: Reported on 12/20/2019 11/20/19   Volanda Napoleon, MD    Past Medical History: Past Medical History:  Diagnosis Date  . Abdominal pain 12/17/2014  . Breast cancer ([place]) 07/12/12   Stage IIB Ductal Carcinoma to  Upper Outer Quadrant: Left Breast: Triple Negative  . Cancer ([place]) 03/03/2015   Overview:  in intestine-2007, in liver-2007, and left breast-2013  . Cancer of upper-outer quadrant of female breast ([place]) 08/01/2012   Image guided biopsy Oct., 2013 -    2 cm by MRI, solitary;     IDC; TNBC; positive LVI;      . Carcinoid tumor of intestine 2001   Mets 2007  . Carcinoma of breast treated with adjuvant chemotherapy ([place])    4 cycles of Carboplatin / Taxol  . Chronic congestive heart failure ([place]) 03/17/2019  . Congestive heart failure (CHF) ([place])   . Diarrhea    Sandostatin  . Frequent PVCs 04/01/2019  . Goals of care, counseling/discussion 03/31/2019  . Hypertension   . Metastatic carcinoid tumor ([place])   . Morbidly obese ([place])   . Personal  history of chemotherapy 2013   Left Breast Cancer  . Personal history of radiation therapy 2013   Left Breast Cancer  . S/P radiation therapy 02/04/2013-02/21/2013   Left Breast and Axilla / 46.8 Gy in 26 fractions were planned (she only received 18 Gy in 10 fractions)  . Syncope 07/2019    Past Surgical History: Past Surgical History:  Procedure Laterality Date  . BREAST LUMPECTOMY Left 2013  . BREAST SURGERY  08/23/12   Lt br lumpectomy  . [place] 2007  . COLON SURGERY  2001   Cancer/ Intestinal Resection  . OVARIAN CYST REMOVAL  2001  .  PARTIAL MASTECTOMY WITH NEEDLE LOCALIZATION AND AXILLARY SENTINEL LYMPH NODE BX  08/23/2012   Procedure: PARTIAL MASTECTOMY WITH NEEDLE LOCALIZATION AND AXILLARY SENTINEL LYMPH NODE BX;  Surgeon: Adin Hector, MD;  Location: [place];  Service: General;  Laterality: Left;  blue dye injection   . PORTACATH PLACEMENT  08/23/2012   Procedure: INSERTION PORT-A-CATH;  Surgeon: Adin Hector, MD;  Location: [place];  Service: General;  Laterality: Right;  intraop ultrasound    Family History: Family History  Problem Relation Age of Onset  . Heart attack Father   . Diabetes Father   . Hypertension Mother   . Diabetes Mother   . Glaucoma Mother   . Hypertension Brother   . Asthma Brother   . Cancer Paternal Grandmother   . Colon cancer Neg Hx   . Esophageal cancer Neg Hx   . Rectal cancer Neg Hx   . Stomach cancer Neg Hx     Social History: Social History   Socioeconomic History  . Marital status: Divorced    Spouse name: Not on file  . Number of children: Not on file  . Years of education: Not on file  . Highest education level: Not on file  Occupational History  . Not on file  Tobacco Use  . Smoking status: Never Smoker  . Smokeless tobacco: Never Used  . Tobacco comment: NEVER SMOKED  Substance and Sexual Activity  . Alcohol use: No    Alcohol/week: 0.0 standard drinks  . Drug use: No  . Sexual activity: Not  Currently    Comment: menarche age 48, P 3, G 1, 1 misscarriage  Other Topics Concern  . Not on file  Social History Narrative  . Not on file   Social Determinants of Health   Financial Resource Strain:   . Difficulty of Paying Living Expenses:   Food Insecurity:   . Worried About Charity fundraiser in the Last Year:   . Arboriculturist in the Last Year:   Transportation Needs:   . Film/video editor (Medical):   Marland Kitchen Lack of Transportation (Non-Medical):   Physical Activity:   . Days of Exercise per Week:   . Minutes of Exercise per Session:   Stress:   . Feeling of Stress :   Social Connections:   . Frequency of Communication with Friends and Family:   . Frequency of Social Gatherings with Friends and Family:   . Attends Religious Services:   . Active Member of Clubs or Organizations:   . Attends Archivist Meetings:   Marland Kitchen Marital Status:     Allergies:  Allergies  Allergen Reactions  . Sulfamethoxazole-Trimethoprim Swelling and Hypertension    Swelling and hypertension after taking, was admitted to hospital  . Adhesive [Tape] Rash and Other (See Comments)    Causes SEVERE RASHES AND BLISTERS    Objective:    Vital Signs:   Temp:  [97.5 F (36.4 C)-97.8 F (36.6 C)] 97.7 F (36.5 C) (03/14 1518) Pulse Rate:  [78-85] 78 (03/14 1518) Resp:  [18-20] 20 (03/14 1518) BP: (106-140)/(59-94) 140/89 (03/14 1518) SpO2:  [98 %-100 %] 98 % (03/14 1518) Weight:  [105.5 kg] 105.5 kg (03/14 0439) Last BM Date: 12/20/19  Weight change: Filed Weights   12/21/19 0439  Weight: 105.5 kg    Intake/Output:   Intake/Output Summary (Last 24 hours) at 12/21/2019 1638 Last data filed at 12/21/2019 1524 Gross per 24 hour  Intake 717 ml  Output 2600  ml  Net -1883 ml      Physical Exam    General:  Obese woman sitting in chair No resp difficulty HEENT: normal Neck: supple. JVP to ear with prominent v waves. Carotids 2+ bilat; no bruits. No lymphadenopathy or  thyromegaly appreciated. Cor: PMI nondisplaced. Regular rate & rhythm. 3/6 TR Lungs: clear Abdomen: obese soft, nontender, + distended. No hepatosplenomegaly. No bruits or masses. Good bowel sounds. Extremities: no cyanosis, clubbing, rash, 3+ edema cool Neuro: alert & orientedx3, cranial nerves grossly intact. moves all 4 extremities w/o difficulty. Affect pleasant   Telemetry   Sinus with BBB has bursts of frequent PVCs/bigeminy (with 30-43 PVCs per minute) but in general PVC burden is 0-1 per minute Personally reviewed   EKG    NSR with IVC/LBBB and frequent PVCs + right axis Personally reviewed   Labs   Basic Metabolic Panel: Recent Labs  Lab 12/17/19 1532 12/17/19 1532 12/19/19 1344 12/20/19 0700 12/20/19 0842 12/21/19 0523  NA 144  --  138 138  --  139  K 3.0*  --  2.8* 3.7  --  3.0*  CL 100  --  101 102  --  99  CO2 21  --  25 21*  --  28  GLUCOSE 105*  --  140* 122*  --  109*  BUN 29*  --  34* 32*  --  31*  CREATININE 2.60*  --  2.51* 2.66*  --  2.73*  CALCIUM 9.6   < > 9.2 9.0  --  8.8*  MG  --   --   --   --  1.6* 1.8  PHOS  --   --   --   --   --  4.1   < > = values in this interval not displayed.    Liver Function Tests: Recent Labs  Lab 12/19/19 1344 12/20/19 0842 12/21/19 0523  AST 14* 19 19  ALT 12 16 17   ALKPHOS 101 95 94  BILITOT 0.9 1.3* 1.3*  PROT 7.1 6.9 6.4*  ALBUMIN 4.0 3.5 3.3*   No results for input(s): LIPASE, AMYLASE in the last 168 hours. No results for input(s): AMMONIA in the last 168 hours.  CBC: Recent Labs  Lab 12/19/19 1344 12/20/19 0700 12/21/19 0523  WBC 6.1 6.6 5.5  NEUTROABS 3.6 3.1 2.7  HGB 13.6 14.0 13.4  HCT 43.6 46.1* 43.6  MCV 89.0 90.7 90.1  PLT 219 217 205    Cardiac Enzymes: No results for input(s): CKTOTAL, CKMB, CKMBINDEX, TROPONINI in the last 168 hours.  BNP: BNP (last 3 results) Recent Labs    07/21/19 1334 10/13/19 1300 12/20/19 0700  BNP 69.8 301.6* 382.1*    ProBNP (last 3  results) Recent Labs    04/09/19 1458 05/20/19 1611 12/17/19 1532  PROBNP 469* 1,071* 5,909*     CBG: No results for input(s): GLUCAP in the last 168 hours.  Coagulation Studies: No results for input(s): LABPROT, INR in the last 72 hours.   Imaging   ECHOCARDIOGRAM COMPLETE  Result Date: 12/21/2019    ECHOCARDIOGRAM REPORT   Patient Name:   Amber Bautista Date of Exam: 12/20/2019 Medical Rec #:  [id number]      Height:       66.0 in Accession #:    [id number]     Weight:       236.0 lb Date of Birth:  15-Sep-1971      BSA:  2.146 m Patient Age:    1 years       BP:           141/92 mmHg Patient Gender: F              HR:           85 bpm. Exam Location:  Inpatient Procedure: 2D Echo                             MODIFIED REPORT: This report was modified by Cherlynn Kaiser MD on 12/21/2019 due to error.  Indications:     congestive heart failure 428.0 Carcinoid tumor.  History:         Patient has prior history of Echocardiogram examinations, most                  recent 05/23/2019. Cardiomyopathy and CHF;                  Signs/Symptoms:Shortness of Breath.  Sonographer:     Johny Chess Referring Phys:  [id number] Elouise Munroe Diagnosing Phys: Cherlynn Kaiser MD  Sonographer Comments: Image acquisition challenging due to patient body habitus. IMPRESSIONS  1. Left ventricular ejection fraction, by estimation, is 20%. The left ventricle has severely decreased function. The left ventricle demonstrates global hypokinesis. The left ventricular internal cavity size was mildly to moderately dilated. Left ventricular diastolic parameters are indeterminate. There is abnormal (paradoxical) septal motion, consistent with right ventricular volume overload.  2. Right ventricular systolic function is hyperdynamic. The right ventricular size is normal. There is mildly elevated pulmonary artery systolic pressure. The estimated right ventricular systolic pressure is 47.4 mmHg.  3. Right atrial size was  mild to moderately dilated.  4. The mitral valve is normal in structure. Mild mitral valve regurgitation. No evidence of mitral stenosis.  5. Fixed and retracted tricuspid valve leaflets consistent with carcinoid heart disease. Incomplete coaptation leads to wide-open tricuspid regurgitation. The tricuspid valve is abnormal. Tricuspid valve regurgitation is severe. Severe tricuspid stenosis.  6. The aortic valve is normal in structure. Aortic valve regurgitation is trivial. No aortic stenosis is present.  7. Pulmonic valve regurgitation is moderate to severe. Mild pulmonic stenosis.  8. The inferior vena cava is normal in size with greater than 50% respiratory variability, suggesting right atrial pressure of 3 mmHg. Comparison(s): A prior study was performed on 05/23/2019. Prior images reviewed side by side. LVEF is severely decreased. Progression of carcinoid right sided valve disease. FINDINGS  Left Ventricle: Left ventricular ejection fraction, by estimation, is 20%. The left ventricle has severely decreased function. The left ventricle demonstrates global hypokinesis. The left ventricular internal cavity size was mildly to moderately dilated. There is no left ventricular hypertrophy. Abnormal (paradoxical) septal motion, consistent with right ventricular volume overload and abnormal (paradoxical) septal motion, consistent with left bundle branch block. Left ventricular diastolic parameters are indeterminate. Right Ventricle: The right ventricular size is normal. No increase in right ventricular wall thickness. Right ventricular systolic function is hyperdynamic. There is mildly elevated pulmonary artery systolic pressure. The tricuspid regurgitant velocity is 2.97 m/s, and with an assumed right atrial pressure of 5 mmHg, the estimated right ventricular systolic pressure is 25.9 mmHg. Left Atrium: Left atrial size was normal in size. Right Atrium: Right atrial size was mild to moderately dilated. Pericardium:  Trivial pericardial effusion is present. Mitral Valve: The mitral valve is normal in structure. There is mild thickening of the  mitral valve leaflet(s). Normal mobility of the mitral valve leaflets. Mild mitral valve regurgitation. No evidence of mitral valve stenosis. Tricuspid Valve: Fixed and retracted tricuspid valve leaflets consistent with carcinoid heart disease. Incomplete coaptation leads to wide-open tricuspid regurgitation. The tricuspid valve is abnormal. Tricuspid valve regurgitation is severe. Severe tricuspid stenosis. Aortic Valve: The aortic valve is normal in structure. Aortic valve regurgitation is trivial. No aortic stenosis is present. Pulmonic Valve: The pulmonic valve was not well visualized. Pulmonic valve regurgitation is moderate to severe. Mild pulmonic stenosis. Aorta: The aortic root is normal in size and structure. Venous: The inferior vena cava is normal in size with greater than 50% respiratory variability, suggesting right atrial pressure of 3 mmHg. IAS/Shunts: No atrial level shunt detected by color flow Doppler.  LEFT VENTRICLE PLAX 2D LVIDd:         5.60 cm      Diastology LVIDs:         5.80 cm      LV e' lateral: 6.53 cm/s LV PW:         1.00 cm      LV e' medial:  5.00 cm/s LV IVS:        0.90 cm LVOT diam:     1.90 cm LV SV:         35 LV SV Index:   17 LVOT Area:     2.84 cm  LV Volumes (MOD) LV vol d, MOD A4C: 163.0 ml LV vol s, MOD A4C: 126.0 ml LV SV MOD A4C:     163.0 ml RIGHT VENTRICLE RV S prime:     17.70 cm/s TAPSE (M-mode): 2.4 cm LEFT ATRIUM             Index       RIGHT ATRIUM           Index LA diam:        4.80 cm 2.24 cm/m  RA Area:     20.45 cm LA Vol (A2C):   72.9 ml 33.98 ml/m RA Volume:   60.80 ml  28.34 ml/m LA Vol (A4C):   53.2 ml 24.80 ml/m LA Biplane Vol: 62.4 ml 29.08 ml/m  AORTIC VALVE LVOT Vmax:   91.40 cm/s LVOT Vmean:  60.300 cm/s LVOT VTI:    0.125 m TRICUSPID VALVE TR Peak grad:   35.3 mmHg TR Vmax:        297.00 cm/s  SHUNTS Systemic VTI:   0.12 m Systemic Diam: 1.90 cm Cherlynn Kaiser MD Electronically signed by Cherlynn Kaiser MD Signature Date/Time: 12/20/2019/5:53:31 PM    Final (Updated)       Medications:     Current Medications: . enoxaparin (LOVENOX) injection  40 mg Subcutaneous Q24H  . furosemide  80 mg Intravenous BID  . metoprolol succinate  75 mg Oral Daily  . potassium chloride  40 mEq Oral BID     Infusions: . magnesium sulfate bolus IVPB         Assessment/Plan   1. Acute systolic HF due to NICM - Echo 2013 EF 25-30%. Cath normal cors - Echo 2020 EF 45-50% - Echo 12/19/19: EF 20% septal DK. RV ok TV restricted severe TR  - suspect LV dysfunction due to PVCs +/- LBBB. PVCs come in bursts not consistently high ? If burden high enough to cause CM - NYHA IIIB-IV. Suspect low output -Volume status elevated  - Continue IV diuresis - Hold b-blocker - Place PICC for CVP and co-ox - Off  Entresto/spiro due to AKI - Start amio to suppress PVCs - Will need RHC - Place [employer]  - Although prognosis seems quite poor with several major health issues including metastatic carcinoid syndrome, I think the main issue now is likely low output HF in setting of profound cardiomyopathy wither due to PVCs +/- LBBB. Will attempt to suppress PVCs with amio and support with short-term inotropes as needed to help improve symptoms and facilitate volume removal and renal recovery. Will d/w EP   2. Frequent PVCs. - Zio Patch performed 04/07/2019 showed frequent PVCs with a 7.4% burden  - On tele, PVCs come in bursts not consistently high.  ? If burden high enough to cause CM. Will d/w EP  - start amio to suppress - keep K> 4.0, Mg > 2.0  3. Acute on CKD3 - creatinine 1.3 in 11/20 -> 1.7 1/21 -> 2.6 - suspect due to low output/cardiorenal  4. 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 Metastatic carcinoid syndrome with liver mets - followed by Dr. Marin Olp - on somatuline  6. LBBB/IVCD - ideally might  benefit from CRT if LV does not respond to PVC suppression but prognosis may prohibit  7. Hypokalemia/Hypomagnesemia - supp to keep K> 4.0, Mg > 2.0  Length of Stay: 0  Glori Bickers, MD  12/21/2019, 4:38 PM  Advanced Heart Failure Team Pager [phone] (M-F; 7a - 4p)  Please contact [facility] for night-coverage after hours (4p -7a ) and weekends on [email or web address]

## 2019-12-21 NOTE — Progress Notes (Signed)
Progress Note  Patient Name: Amber Bautista Date of Encounter: 12/21/2019  Primary Cardiologist: Shirlee More, MD   Subjective   We discussed the results of the echo.  She tells me she is feeling better today probably because she is able to pass urine.  We began the discussion of her goals for her care in light of progression of carcinoid heart disease and relative worsening of her LV function.  She tells me that at home she feels her heart is not always pumping properly and she will get lightheaded.  She also tells me that she has PND and orthopnea, with episodic dyspnea for which she has to sit up on the side of the bed.  Inpatient Medications    Scheduled Meds: . enoxaparin (LOVENOX) injection  40 mg Subcutaneous Q24H  . metoprolol succinate  75 mg Oral Daily   Continuous Infusions:  PRN Meds: acetaminophen **OR** acetaminophen   Vital Signs    Vitals:   12/20/19 1546 12/20/19 2011 12/21/19 0439 12/21/19 0938  BP: (!) 141/92 (!) 129/93 (!) 138/94 (!) 106/59  Pulse: 84 84 85 79  Resp: 20 20 18 20   Temp: (!) 97.4 F (36.3 C) (!) 97.5 F (36.4 C) 97.6 F (36.4 C) 97.8 F (36.6 C)  TempSrc: Oral  Oral Oral  SpO2: 98% 98% 100% 98%  Weight:   105.5 kg     Intake/Output Summary (Last 24 hours) at 12/21/2019 1057 Last data filed at 12/21/2019 0700 Gross per 24 hour  Intake 527 ml  Output 2000 ml  Net -1473 ml   Last 3 Weights 12/21/2019 12/19/2019 12/17/2019  Weight (lbs) 232 lb 9.6 oz 236 lb 233 lb  Weight (kg) 105.507 kg 107.049 kg 105.688 kg      Telemetry    PVCs in bigeminy, left bundle branch block with sinus rhythm- Personally Reviewed  ECG    No new - Personally Reviewed  Physical Exam   GEN: No acute distress.   Neck: JVD challenging to assess. Cardiac: RRR, 3/6 holosystolic murmur heard best at the left sternal border harsh in quality, no rubs, or gallops.  Respiratory:  Faint basilar crackles GI: Soft, nontender, non-distended  MS:  1+ bilateral  edema to the knee; No deformity. Neuro:  Nonfocal  Psych: Normal affect   Labs    High Sensitivity Troponin:   Recent Labs  Lab 12/20/19 0700 12/20/19 0842  TROPONINIHS 29* 35*      Chemistry Recent Labs  Lab 12/19/19 1344 12/20/19 0700 12/20/19 0842 12/21/19 0523  NA 138 138  --  139  K 2.8* 3.7  --  3.0*  CL 101 102  --  99  CO2 25 21*  --  28  GLUCOSE 140* 122*  --  109*  BUN 34* 32*  --  31*  CREATININE 2.51* 2.66*  --  2.73*  CALCIUM 9.2 9.0  --  8.8*  PROT 7.1  --  6.9 6.4*  ALBUMIN 4.0  --  3.5 3.3*  AST 14*  --  19 19  ALT 12  --  16 17  ALKPHOS 101  --  95 94  BILITOT 0.9  --  1.3* 1.3*  GFRNONAA 22* 20*  --  20*  GFRAA 25* 23*  --  23*  ANIONGAP 12 15  --  12     Hematology Recent Labs  Lab 12/19/19 1344 12/20/19 0700 12/21/19 0523  WBC 6.1 6.6 5.5  RBC 4.90 5.08 4.84  HGB 13.6 14.0 13.4  HCT 43.6 46.1* 43.6  MCV 89.0 90.7 90.1  MCH 27.8 27.6 27.7  MCHC 31.2 30.4 30.7  RDW 18.1* 18.4* 18.3*  PLT 219 217 205    BNP Recent Labs  Lab 12/17/19 1532 12/20/19 0700  BNP  --  382.1*  PROBNP 5,909*  --      DDimer No results for input(s): DDIMER in the last 168 hours.   Radiology    US RENAL  Result Date: 12/20/2019 CLINICAL DATA:  49 year old female with acute kidney injury. EXAM: RENAL / URINARY TRACT ULTRASOUND COMPLETE COMPARISON:  04/21/2019 CT FINDINGS: Right Kidney: Renal measurements: 10.8 x 4.6 x 6.1 cm = volume: 158 mL . Echogenicity within normal limits. No mass or hydronephrosis visualized. Left Kidney: Renal measurements: 10.4 x 4.5 x 4.8 cm = volume: 117 mL. Echogenicity within normal limits. No mass or hydronephrosis visualized. Bladder: Appears normal for degree of bladder distention. Other: None. IMPRESSION: Unremarkable renal ultrasound. Electronically Signed   By: Margarette Canada M.D.   On: 12/20/2019 12:47   DG Chest Portable 1 View  Result Date: 12/20/2019 CLINICAL DATA:  Chest pain, shortness of breath EXAM: PORTABLE CHEST  1 VIEW COMPARISON:  10/13/2019 FINDINGS: Lungs are clear.  No pleural effusion or pneumothorax. The heart is normal in size. IMPRESSION: No evidence of acute cardiopulmonary disease. Electronically Signed   By: Julian Hy M.D.   On: 12/20/2019 07:04   ECHOCARDIOGRAM COMPLETE  Result Date: 12/20/2019    ECHOCARDIOGRAM REPORT   Patient Name:   Amber Bautista Date of Exam: 12/20/2019 Medical Rec #:  [id number]      Height:       66.0 in Accession #:    [id number]     Weight:       236.0 lb Date of Birth:  05/25/1971      BSA:          2.146 m Patient Age:    49 years       BP:           141/92 mmHg Patient Gender: F              HR:           85 bpm. Exam Location:  Inpatient Procedure: 2D Echo                             MODIFIED REPORT: This report was modified by Cherlynn Kaiser MD on 12/20/2019 due to error.  Indications:     congestive heart failure 428.0 Carcinoid tumor.  History:         Patient has prior history of Echocardiogram examinations, most                  recent 05/23/2019. Cardiomyopathy and CHF;                  Signs/Symptoms:Shortness of Breath.  Sonographer:     Johny Chess Referring Phys:  [id number] Elouise Munroe Diagnosing Phys: Cherlynn Kaiser MD  Sonographer Comments: Image acquisition challenging due to patient body habitus. IMPRESSIONS  1. Left ventricular ejection fraction, by estimation, is 20%. The left ventricle has severely decreased function. The left ventricle demonstrates global hypokinesis. The left ventricular internal cavity size was mildly to moderately dilated. Left ventricular diastolic parameters are indeterminate. There is abnormal (paradoxical) septal motion, consistent with right ventricular volume overload.  2. Right ventricular systolic function is hyperdynamic. The right ventricular size  is normal. There is mildly elevated pulmonary artery systolic pressure. The estimated right ventricular systolic pressure is 72.5 mmHg.  3. Right atrial size was mild to  moderately dilated.  4. The mitral valve is normal in structure. Mild mitral valve regurgitation. No evidence of mitral stenosis.  5. Fixed and retracted tricuspid valve leaflets consistent with carcinoid heart disease. Incomplete coaptation leads to wide-open tricuspid regurgitation. The tricuspid valve is abnormal. Tricuspid valve regurgitation is severe. Severe tricuspid stenosis.  6. The aortic valve is normal in structure. Aortic valve regurgitation is trivial. No aortic stenosis is present.  7. Pulmonic valve regurgitation is moderate to severe. Mild pulmonic stenosis.  8. The inferior vena cava is normal in size with greater than 50% respiratory variability, suggesting right atrial pressure of 3 mmHg. This finding may be secondary to underfilled RV with relative decreased absolute regurgitant volume of TR. Comparison(s): A prior study was performed on 05/23/2019. Prior images reviewed side by side. LVEF is severely decreased. Progression of carcinoid right sided valve disease. FINDINGS  Left Ventricle: Left ventricular ejection fraction, by estimation, is 20%. The left ventricle has severely decreased function. The left ventricle demonstrates global hypokinesis. The left ventricular internal cavity size was mildly to moderately dilated. There is no left ventricular hypertrophy. Abnormal (paradoxical) septal motion, consistent with right ventricular volume overload and abnormal (paradoxical) septal motion, consistent with left bundle branch block. Left ventricular diastolic parameters are indeterminate. Right Ventricle: The right ventricular size is normal. No increase in right ventricular wall thickness. Right ventricular systolic function is hyperdynamic. There is mildly elevated pulmonary artery systolic pressure. The tricuspid regurgitant velocity is 2.97 m/s, and with an assumed right atrial pressure of 5 mmHg, the estimated right ventricular systolic pressure is 36.6 mmHg. Left Atrium: Left atrial size  was normal in size. Right Atrium: Right atrial size was mild to moderately dilated. Pericardium: There is no evidence of pericardial effusion. Mitral Valve: The mitral valve is normal in structure. Normal mobility of the mitral valve leaflets. Mild mitral valve regurgitation. No evidence of mitral valve stenosis. Tricuspid Valve: Fixed and retracted tricuspid valve leaflets consistent with carcinoid heart disease. Incomplete coaptation leads to wide-open tricuspid regurgitation. The tricuspid valve is abnormal. Tricuspid valve regurgitation is severe. Severe tricuspid stenosis. Aortic Valve: The aortic valve is normal in structure. Aortic valve regurgitation is trivial. No aortic stenosis is present. Pulmonic Valve: The pulmonic valve was not well visualized. Pulmonic valve regurgitation is moderate to severe. Mild pulmonic stenosis. Aorta: The aortic root is normal in size and structure. Venous: The inferior vena cava is normal in size with greater than 50% respiratory variability, suggesting right atrial pressure of 3 mmHg. IAS/Shunts: No atrial level shunt detected by color flow Doppler.  LEFT VENTRICLE PLAX 2D LVIDd:         5.60 cm      Diastology LVIDs:         5.80 cm      LV e' lateral: 6.53 cm/s LV PW:         1.00 cm      LV e' medial:  5.00 cm/s LV IVS:        0.90 cm LVOT diam:     1.90 cm LV SV:         35 LV SV Index:   17 LVOT Area:     2.84 cm  LV Volumes (MOD) LV vol d, MOD A4C: 163.0 ml LV vol s, MOD A4C: 126.0 ml LV SV MOD A4C:  163.0 ml RIGHT VENTRICLE RV S prime:     17.70 cm/s TAPSE (M-mode): 2.4 cm LEFT ATRIUM             Index       RIGHT ATRIUM           Index LA diam:        4.80 cm 2.24 cm/m  RA Area:     20.45 cm LA Vol (A2C):   72.9 ml 33.98 ml/m RA Volume:   60.80 ml  28.34 ml/m LA Vol (A4C):   53.2 ml 24.80 ml/m LA Biplane Vol: 62.4 ml 29.08 ml/m  AORTIC VALVE LVOT Vmax:   91.40 cm/s LVOT Vmean:  60.300 cm/s LVOT VTI:    0.125 m TRICUSPID VALVE TR Peak grad:   35.3 mmHg TR  Vmax:        297.00 cm/s  SHUNTS Systemic VTI:  0.12 m Systemic Diam: 1.90 cm Cherlynn Kaiser MD Electronically signed by Cherlynn Kaiser MD Signature Date/Time: 12/20/2019/5:53:31 PM    Final (Updated)     Cardiac Studies   1. Left ventricular ejection fraction, by estimation, is 20%. The left  ventricle has severely decreased function. The left ventricle demonstrates  global hypokinesis. The left ventricular internal cavity size was mildly  to moderately dilated. Left  ventricular diastolic parameters are indeterminate. There is abnormal  (paradoxical) septal motion, consistent with right ventricular volume  overload.  2. Right ventricular systolic function is hyperdynamic. The right  ventricular size is normal. There is mildly elevated pulmonary artery  systolic pressure. The estimated right ventricular systolic pressure is  96.7 mmHg.  3. Right atrial size was mild to moderately dilated.  4. The mitral valve is normal in structure. Mild mitral valve  regurgitation. No evidence of mitral stenosis.  5. Fixed and retracted tricuspid valve leaflets consistent with carcinoid  heart disease. Incomplete coaptation leads to wide-open tricuspid  regurgitation. The tricuspid valve is abnormal. Tricuspid valve  regurgitation is severe. Severe tricuspid  stenosis.  6. The aortic valve is normal in structure. Aortic valve regurgitation is  trivial. No aortic stenosis is present.  7. Pulmonic valve regurgitation is moderate to severe. Mild pulmonic  stenosis.  8. The inferior vena cava is normal in size with greater than 50%  respiratory variability, suggesting right atrial pressure of 3 mmHg. This  finding may be secondary to underfilled RV with relative decreased  absolute regurgitant volume of TR.   Comparison(s): A prior study was performed on 05/23/2019. Prior images  reviewed side by side. LVEF is severely decreased. Progression of  carcinoid right sided valve disease.    Patient Profile     Amber Bautista is a 49 y.o. 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 Sharon Seller.  Assessment & Plan    1. Dilated NICM/Carcinoid heart disease -  Echocardiogram shows worsening of LV function.  I independently interpreted and assessed the images side-by-side, there is definitely a decrease in LV function.  This could be multifactorial and contributed to by PVCs.  She failed outpatient diuresis and presented with PND, she feels better after diuresis yesterday. Will review with advance heart failure for options on treatment and symptom management. May need involvement of social work given that she cannot afford the copay on her medications for metastatic  carcinoid.   2.  Acute on chronic systolic and diastolic heart failure Net -1.47 L for the admission, 2 L of urine output yesterday after 120 mg of IV Lasix. -We will plan for 80 mg IV twice daily Lasix today.  She may need a higher dose given her renal dysfunction. -will replete Mg and K  3. Frequent PVCs. -metoprolol succinate increased to 75 mg daily - patient symptoms suggest these may not all be perfusing with a measurable output as noted in previous cardiology notes. This is likely contributing to her symptoms.   4. Goals of care - I began the discussion with the patient about goals for cardiovascular care. These discussions will need to be continued, given significant LV dysfunction and progression of carcinoid valvular disease. She tells me "I am at peace with my condition, and I am not looking for a miracle." we discussed that symptom management is at the forefront of our shared goals, and I will discuss with the advanced heart failure team if there are other options for both prognostic  as well as symptomatic benefit.       For questions or updates, please contact [place] Please consult [email or web address] for contact info under        Signed, Elouise Munroe, MD  12/21/2019, 10:57 AM

## 2019-12-21 NOTE — Progress Notes (Signed)
Subjective: Pt seen at the bedside this morning. Up and awake in the chair. Endorses increased urination with diuresis yesterday. Denies any further episodes of feeling off or having shortness of breath. Discussed echo results that reveal worsening LV function and progression of carcinoid heart disease. Instructed pt that she will likely need additional diuresis, but cardiology will follow-up with her later in the day. Pt understanding and without acute concerns at this time.  Objective:  Vital signs in last 24 hours: Vitals:   12/20/19 1323 12/20/19 1546 12/20/19 2011 12/21/19 0439  BP: (!) 137/91 (!) 141/92 (!) 129/93 (!) 138/94  Pulse: 87 84 84 85  Resp: 20 20 20 18   Temp: 98.4 F (36.9 C) (!) 97.4 F (36.3 C) (!) 97.5 F (36.4 C) 97.6 F (36.4 C)  TempSrc: Oral Oral  Oral  SpO2: 97% 98% 98% 100%  Weight:    105.5 kg   Physical Exam Vitals and nursing note reviewed.  Constitutional:      General: She is not in acute distress.    Appearance: She is not ill-appearing.     Comments: Pt sitting up comfortably in the chair.  Cardiovascular:     Rate and Rhythm: Normal rate and regular rhythm.     Heart sounds: Murmur present.  Pulmonary:     Effort: Pulmonary effort is normal. No respiratory distress.     Comments: On room air. Musculoskeletal:     Right lower leg: 2+ Pitting Edema present.     Left lower leg: 2+ Pitting Edema present.  Neurological:     Mental Status: She is alert.    Assessment/Plan:  Active Problems:   Acute exacerbation of CHF (congestive heart failure) ([place])  Ms. Lye is a 49 year old F with significant PMH of metastatic carcinoid tumor, stage IIB ductal triple negative L breast cancer, HFrEF, nonischemic cardiomyopathy, and obesity, who presents with shortness of breath and a general unwell feeling consistent with acute HFrEF exacerbation.   Acute on chronic heart failure exacerbation Repeat echo yesterday with worsening of LV function (EF 20%),  severe tricuspid valve regurg and stenosis, moderate/severe pulmonic valve regurg with mild stenosis, and evidence of RV volume overload. Consistent with progression of carcinoid valve disease. - received 160mg  IV lasix yesterday - 2L urine output, net negative 1.4L - weight today 233lbs, pt believes dry weight to be 230 but appears more than 2lbs volume up with peripheral edema - Cr 2.73 << 2.66, though GFR unchanged at 20 - renal ultrasound without obstruction or evidence for medical renal disease  Cardiology consulted, appreciate their input in this case. 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 Marin Olp monthly, and was last seen yesterday on 3/12. Gets monthly somatuline IM injections for the carcinoid tumor. Infiltrating ductal carcinoma of L breast was diagnosed and treated 8 years ago with chemotherapy. - oncology notes that overall prognosis is related to her CHF - follow-up with oncology in 3 weeks - will follow-up with pt tomorrow regarding financial concerns for outpatient medications  Prior to Admission Living Arrangement: home Anticipated Discharge Location: home Barriers to Discharge: further diuresis Dispo: Anticipated discharge in approximately 1-2 day(s).  Ladona Horns, MD 12/21/2019, 6:23 AM Pager: [phone]

## 2019-12-22 ENCOUNTER — Telehealth: Payer: Self-pay | Admitting: Hematology & Oncology

## 2019-12-22 ENCOUNTER — Inpatient Hospital Stay: Payer: Self-pay

## 2019-12-22 ENCOUNTER — Encounter (HOSPITAL_COMMUNITY): Payer: Self-pay | Admitting: Internal Medicine

## 2019-12-22 LAB — BASIC METABOLIC PANEL
Anion gap: 16 — ABNORMAL HIGH (ref 5–15)
BUN: 35 mg/dL — ABNORMAL HIGH (ref 6–20)
CO2: 27 mmol/L (ref 22–32)
Calcium: 9.1 mg/dL (ref 8.9–10.3)
Chloride: 97 mmol/L — ABNORMAL LOW (ref 98–111)
Creatinine, Ser: 2.76 mg/dL — ABNORMAL HIGH (ref 0.44–1.00)
GFR calc Af Amer: 22 mL/min — ABNORMAL LOW (ref >60)
GFR calc non Af Amer: 19 mL/min — ABNORMAL LOW (ref >60)
Glucose, Bld: 119 mg/dL — ABNORMAL HIGH (ref 70–99)
Potassium: 3.6 mmol/L (ref 3.5–5.1)
Sodium: 140 mmol/L (ref 135–145)

## 2019-12-22 LAB — MAGNESIUM: Magnesium: 2.1 mg/dL (ref 1.7–2.4)

## 2019-12-22 LAB — IRON AND TIBC
Iron: 45 ug/dL (ref 41–142)
Saturation Ratios: 11 % — ABNORMAL LOW (ref 21–57)
TIBC: 424 ug/dL (ref 236–444)
UIBC: 379 ug/dL (ref 120–384)

## 2019-12-22 LAB — COOXEMETRY PANEL
Carboxyhemoglobin: 0.7 % (ref 0.5–1.5)
Carboxyhemoglobin: 0.7 % (ref 0.5–1.5)
Methemoglobin: 0.5 % (ref 0.0–1.5)
Methemoglobin: 0.5 % (ref 0.0–1.5)
O2 Saturation: 53.5 %
O2 Saturation: 66.6 %
Total hemoglobin: 13.3 g/dL (ref 12.0–16.0)
Total hemoglobin: 13.6 g/dL (ref 12.0–16.0)

## 2019-12-22 LAB — FERRITIN: Ferritin: 71 ng/mL (ref 11–307)

## 2019-12-22 LAB — CHROMOGRANIN A: Chromogranin A (ng/mL): 4583 ng/mL — ABNORMAL HIGH (ref 0.0–101.8)

## 2019-12-22 MED ORDER — PNEUMOCOCCAL VAC POLYVALENT 25 MCG/0.5ML IJ INJ: 0.5000 mL | INJECTION | INTRAMUSCULAR | Status: DC

## 2019-12-22 MED ORDER — MILRINONE LACTATE IN DEXTROSE 20-5 MG/100ML-% IV SOLN
0.1250 ug/kg/min | INTRAVENOUS | Status: DC
  Administered 2019-12-22 – 2019-12-27 (×7): 0.125 ug/kg/min via INTRAVENOUS
  Filled 2019-12-22 (×5): qty 100

## 2019-12-22 MED ORDER — HYDROXYZINE HCL 10 MG PO TABS
10.0000 mg | ORAL_TABLET | ORAL | Status: AC | PRN
  Administered 2019-12-22 – 2019-12-25 (×4): 10 mg via ORAL
  Filled 2019-12-22 (×4): qty 1

## 2019-12-22 MED ORDER — CHLORHEXIDINE GLUCONATE CLOTH 2 % EX PADS
6.0000 | MEDICATED_PAD | Freq: Every day | CUTANEOUS | Status: DC
  Administered 2019-12-22 – 2020-01-03 (×12): 6 via TOPICAL

## 2019-12-22 MED ORDER — SODIUM CHLORIDE 0.9% FLUSH: 10.0000 mL | INTRAVENOUS | Status: DC | PRN

## 2019-12-22 MED ORDER — SODIUM CHLORIDE 0.9% FLUSH
10.0000 mL | Freq: Two times a day (BID) | INTRAVENOUS | Status: DC
  Administered 2019-12-22 – 2019-12-24 (×2): 10 mL
  Administered 2019-12-24: 40 mL
  Administered 2019-12-25 – 2019-12-31 (×9): 10 mL

## 2019-12-22 NOTE — Progress Notes (Signed)
   Subjective:   Pt seen at the bedside this morning. Sitting up in chair. States she is feeling well now, but had a little bit of fluttering in her chest overnight. Endorses continued good urine output. Pt aware of PICC line placement today and continued management of her heart failure with diuresis and medications. No acute concerns at this time.  Objective:  Vital signs in last 24 hours: Vitals:   12/21/19 1518 12/21/19 1704 12/21/19 2011 12/22/19 0247  BP: 140/89  (!) 133/93 137/83  Pulse: 78  80 81  Resp: 20  18 20   Temp: 97.7 F (36.5 C)  98 F (36.7 C) 97.9 F (36.6 C)  TempSrc: Oral  Oral Oral  SpO2: 98% 100% 99% 98%  Weight:    104.9 kg   Physical Exam Vitals and nursing note reviewed.  Cardiovascular:     Rate and Rhythm: Normal rate and regular rhythm.     Heart sounds: Murmur present.  Pulmonary:     Effort: Pulmonary effort is normal. No respiratory distress.     Comments: On room air. Musculoskeletal:     Comments: Unna boots in place bilaterally. With trace pitting edema below the knee.   Skin:    General: Skin is warm and dry.    Assessment/Plan:  Active Problems:   Acute exacerbation of CHF (congestive heart failure) ([facility])   Acute on chronic HFrEF (heart failure with reduced ejection fraction) ([place])  Amber Bautista is a 49 year old F with significant PMH of metastatic carcinoid tumor, stage IIB ductal triple negative L breast cancer, HFrEF, nonischemic cardiomyopathy, and obesity, who presents with shortness of breath and a general unwell feeling consistent with acute HFrEF exacerbation.   Acute on chronic heart failure exacerbation from NICM Frequent PVCs LBBB Repeat echo with worsening of LV function (EF 20%), severe tricuspid valve regurg and stenosis, moderate/severe pulmonic valve regurg with mild stenosis, and evidence of RV volume overload. Consistent with progression of carcinoid valve disease. - net 1.8L negative (2.9L urine output) with weight down  0.6kg - Unna boots in place - Cr holding at 2.7 with GFR 22  Advanced heart failure consulted, appreciate their input in this case - placing PICC for co-ox - continue 80mg  IV lasix BID - start amiodarone 400mg  BID - discontinued metoprolol, keep holding entresto/spiro - possible RHC - discuss frequent PVCs with EP - replete Mg and K to keep Mg > 2 and K > 4  Carcinoid tumor metastatic to the liver Stage IIB ductal triple negative L breast cancer Gets monthly somatuline IM injections for the carcinoid tumor. Infiltrating ductal carcinoma of L breast was diagnosed and treated 8 years ago with chemotherapy. - follow-up with Dr. Marin Olp in 3 weeks - nothing to do currently in the acute setting  Prior to Admission Living Arrangement: home Anticipated Discharge Location: home Barriers to Discharge: clinical improvement Dispo: Anticipated discharge pending clinical course.  Ladona Horns, MD 12/22/2019, 6:43 AM Pager: [phone]

## 2019-12-22 NOTE — Telephone Encounter (Signed)
3/12 los scheduled by [place] on 3/12 

## 2019-12-22 NOTE — Progress Notes (Signed)
Milrinone started. Unna boots removed. TED hose ordered.

## 2019-12-22 NOTE — Progress Notes (Signed)
Peripherally Inserted Central Catheter/Midline Placement  The IV Nurse has discussed with the patient and/or persons authorized to consent for the patient, the purpose of this procedure and the potential benefits and risks involved with this procedure.  The benefits include less needle sticks, lab draws from the catheter, and the patient may be discharged home with the catheter. Risks include, but not limited to, infection, bleeding, blood clot (thrombus formation), and puncture of an artery; nerve damage and irregular heartbeat and possibility to perform a PICC exchange if needed/ordered by physician.  Alternatives to this procedure were also discussed.  Bard Power PICC patient education guide, fact sheet on infection prevention and patient information card has been provided to patient /or left at bedside.    PICC/Midline Placement Documentation  PICC Double Lumen 76/72/09 PICC Right Basilic 43 cm (Active)  Indication for Insertion or Continuance of Line Prolonged intravenous therapies 12/22/19 0924  Exposed Catheter (cm) 2 cm 12/22/19 4709  Site Assessment Clean;Dry;Intact 12/22/19 0924  Lumen #1 Status Flushed;Saline locked;Blood return noted 12/22/19 0924  Lumen #2 Status Flushed;Saline locked;Blood return noted 12/22/19 0924  Dressing Type Transparent;Securing device 12/22/19 6283  Dressing Status Clean;Dry;Intact;Antimicrobial disc in place 12/22/19 0924  Dressing Change Due 12/29/19 12/22/19 0924       Frances Maywood 12/22/2019, 9:27 AM

## 2019-12-22 NOTE — Progress Notes (Addendum)
Advanced Heart Failure Rounding Note  PCP-Cardiologist: Shirlee More, MD   Subjective:    CO-OX 53%.   Remains on amio 30 mg per hour.   Complaining about unna boots. Says her legs feel itchy. Denies SOB.    Objective:   Weight Range: 104.9 kg Body mass index is 37.32 kg/m.   Vital Signs:   Temp:  [97.6 F (36.4 C)-98 F (36.7 C)] 97.6 F (36.4 C) (03/15 1257) Pulse Rate:  [78-81] 79 (03/15 1257) Resp:  [18-20] 18 (03/15 1257) BP: (125-140)/(83-108) 125/93 (03/15 1257) SpO2:  [98 %-100 %] 98 % (03/15 1257) Weight:  [104.9 kg] 104.9 kg (03/15 0247) Last BM Date: 12/21/19  Weight change: Filed Weights   12/21/19 0439 12/22/19 0247  Weight: 105.5 kg 104.9 kg    Intake/Output:   Intake/Output Summary (Last 24 hours) at 12/22/2019 1323 Last data filed at 12/22/2019 1200 Gross per 24 hour  Intake 860 ml  Output 3700 ml  Net -2840 ml      Physical Exam   CVP 19  General:  In bed. . No resp difficulty HEENT: Normal Neck: Supple. JVP to jaw  . Carotids 2+ bilat; no bruits. No lymphadenopathy or thyromegaly appreciated. Cor: PMI nondisplaced. Regular rate & rhythm. No rubs, gallops or murmurs. Lungs: Clear Abdomen: Soft, nontender, nondistended. No hepatosplenomegaly. No bruits or masses. Good bowel sounds. Extremities: No cyanosis, clubbing, rash, R and LLE unna boots. RUE PICC Neuro: Alert & orientedx3, cranial nerves grossly intact. moves all 4 extremities w/o difficulty. Affect pleasant   Telemetry    NSR with PVCs EKG    N/a   Labs    CBC Recent Labs    12/20/19 0700 12/21/19 0523  WBC 6.6 5.5  NEUTROABS 3.1 2.7  HGB 14.0 13.4  HCT 46.1* 43.6  MCV 90.7 90.1  PLT 217 202   Basic Metabolic Panel Recent Labs    12/21/19 0523 12/22/19 0719  NA 139 140  K 3.0* 3.6  CL 99 97*  CO2 28 27  GLUCOSE 109* 119*  BUN 31* 35*  CREATININE 2.73* 2.76*  CALCIUM 8.8* 9.1  MG 1.8 2.1  PHOS 4.1  --    Liver Function Tests Recent Labs   12/20/19 0842 12/21/19 0523  AST 19 19  ALT 16 17  ALKPHOS 95 94  BILITOT 1.3* 1.3*  PROT 6.9 6.4*  ALBUMIN 3.5 3.3*   No results for input(s): LIPASE, AMYLASE in the last 72 hours. Cardiac Enzymes No results for input(s): CKTOTAL, CKMB, CKMBINDEX, TROPONINI in the last 72 hours.  BNP: BNP (last 3 results) Recent Labs    07/21/19 1334 10/13/19 1300 12/20/19 0700  BNP 69.8 301.6* 382.1*    ProBNP (last 3 results) Recent Labs    04/09/19 1458 05/20/19 1611 12/17/19 1532  PROBNP 469* 1,071* 5,909*     D-Dimer No results for input(s): DDIMER in the last 72 hours. Hemoglobin A1C No results for input(s): HGBA1C in the last 72 hours. Fasting Lipid Panel No results for input(s): CHOL, HDL, LDLCALC, TRIG, CHOLHDL, LDLDIRECT in the last 72 hours. Thyroid Function Tests Recent Labs    12/21/19 0523  TSH 3.218    Other results:   Imaging    [place] EKG SITE RITE  Result Date: 12/22/2019 If Site Rite image not attached, placement could not be confirmed due to current cardiac rhythm.  [place] EKG SITE RITE  Result Date: 12/21/2019 If Site Rite image not attached, placement could not be confirmed due to  current cardiac rhythm.     Medications:     Scheduled Medications: . amiodarone  400 mg Oral BID  . Chlorhexidine Gluconate Cloth  6 each Topical Daily  . enoxaparin (LOVENOX) injection  40 mg Subcutaneous Q24H  . furosemide  80 mg Intravenous BID  . [START ON 12/23/2019] pneumococcal 23 valent vaccine  0.5 mL Intramuscular Tomorrow-1000  . potassium chloride  40 mEq Oral BID  . sodium chloride flush  10-40 mL Intracatheter Q12H     Infusions:   PRN Medications:  acetaminophen **OR** acetaminophen, sodium chloride flush     Assessment/Plan   1. Acute systolic HF due to NICM - Echo 2013 EF 25-30%. Cath normal cors - Echo 2020 EF 45-50% - Echo 12/19/19: EF 20% septal DK. RV ok TV restricted severe TR  - suspect LV dysfunction due to PVCs +/- LBBB. PVCs  come in bursts not consistently high ? If burden high enough to cause CM - CO-OX 53%. Start milrinone 0.125 mcg.  - Remains volume overloaded. Continue IV lasix.  - Hold b-blocker - Off Entresto/spiro due to AKI -  Remove unna boots and place ted hose.  - Per Dr Haroldine Laws. Although prognosis seems quite poor with several major health issues including metastatic carcinoid syndrome, I think the main issue now is likely low output HF in setting of profound cardiomyopathy wither due to PVCs +/- LBBB. Will attempt to suppress PVCs with amio and support with short-term inotropes as needed to help improve symptoms and facilitate volume removal and renal recovery. Will d/w EP   2. Frequent PVCs. - Zio Patch performed 04/07/2019 showed frequent PVCs with a 7.4% burden  - On tele, PVCs come in bursts not consistently high.  ? If burden high enough to cause CM.  - EP consulted. Consider LV lead after discharge by Dr Caryl Comes  - Continue IV amiodarone to suppress PVCs. - keep K> 4.0, Mg > 2.0  3. Acute on CKD3 - creatinine 1.3 in 11/20 -> 1.7 1/21 -> - Creatinine  2.6>2.7 - BMET in am.   - suspect due to low output/cardiorenal  4. 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 Metastatic carcinoid syndrome with liver mets - followed by Dr. Marin Olp - on somatuline  6. LBBB/IVCD - ideally might benefit from CRT if LV does not respond to PVC suppression but prognosis may prohibit  7. Hypokalemia/Hypomagnesemia - supp to keep K> 4.0, Mg > 2.0  Consult cardiac rehab.   Length of Stay: 1  Amy Clegg, NP  12/22/2019, 1:23 PM  Advanced Heart Failure Team Pager [phone] (M-F; 7a - 4p)  Please contact [facility] for night-coverage after hours (4p -7a ) and weekends on [email or web address]   Patient seen and examined with the above-signed Advanced Practice Provider and/or Housestaff. I personally reviewed laboratory data, imaging studies and relevant notes. I independently examined the patient  and formulated the important aspects of the plan. I have edited the note to reflect any of my changes or salient points. I have personally discussed the plan with the patient and/or family.  Feel weak. Tired. Mildly SOB. Leg swelling improved but UNNA boots uncomfortable. CVP 19 Co-ox 53%. Creatinine worse.   General:  Weak appearing. No resp difficulty HEENT: normal Neck: supple. JVP to ear  Carotids 2+ bilat; no bruits. No lymphadenopathy or thryomegaly appreciated. Cor: PMI nondisplaced. Regular rate & rhythm. 3/6 TR Lungs: clear Abdomen: obesesoft, nontender, nondistended. No hepatosplenomegaly. No bruits or masses. Good bowel sounds. Extremities:  no cyanosis, clubbing, rash, 2+ edema + UNNA Neuro: alert & orientedx3, cranial nerves grossly intact. moves all 4 extremities w/o difficulty. Affect pleasant  She has low output HF with worsening renal function and significant volume overload. I suspect worsening LV function likely related to frequent PVCs +/ LBBB. Have started amio to help suppress PVCs. Case reviewed in detail with Dr. Caryl Comes and previous ECgs and echos reviewed. We both feel that CRT with LV lead only may possibly help her.   Will start milrinone for hemodynamic support and continue diuresis with hopes of optimizing her in the interim.   Glori Bickers, MD  10:03 PM

## 2019-12-23 ENCOUNTER — Other Ambulatory Visit: Payer: Self-pay

## 2019-12-23 LAB — BASIC METABOLIC PANEL
Anion gap: 12 (ref 5–15)
BUN: 34 mg/dL — ABNORMAL HIGH (ref 6–20)
CO2: 28 mmol/L (ref 22–32)
Calcium: 8.6 mg/dL — ABNORMAL LOW (ref 8.9–10.3)
Chloride: 98 mmol/L (ref 98–111)
Creatinine, Ser: 2.61 mg/dL — ABNORMAL HIGH (ref 0.44–1.00)
GFR calc Af Amer: 24 mL/min — ABNORMAL LOW (ref >60)
GFR calc non Af Amer: 21 mL/min — ABNORMAL LOW (ref >60)
Glucose, Bld: 139 mg/dL — ABNORMAL HIGH (ref 70–99)
Potassium: 3.1 mmol/L — ABNORMAL LOW (ref 3.5–5.1)
Sodium: 138 mmol/L (ref 135–145)

## 2019-12-23 LAB — COOXEMETRY PANEL
Carboxyhemoglobin: 0.7 % (ref 0.5–1.5)
Methemoglobin: 0.4 % (ref 0.0–1.5)
O2 Saturation: 58.7 %
Total hemoglobin: 12.6 g/dL (ref 12.0–16.0)

## 2019-12-23 LAB — CBC
HCT: 40.4 % (ref 36.0–46.0)
Hemoglobin: 12.7 g/dL (ref 12.0–15.0)
MCH: 28.3 pg (ref 26.0–34.0)
MCHC: 31.4 g/dL (ref 30.0–36.0)
MCV: 90.2 fL (ref 80.0–100.0)
Platelets: 198 10*3/uL (ref 150–400)
RBC: 4.48 MIL/uL (ref 3.87–5.11)
RDW: 18.3 % — ABNORMAL HIGH (ref 11.5–15.5)
WBC: 5.6 10*3/uL (ref 4.0–10.5)
nRBC: 1.1 % — ABNORMAL HIGH (ref 0.0–0.2)

## 2019-12-23 LAB — MAGNESIUM: Magnesium: 1.7 mg/dL (ref 1.7–2.4)

## 2019-12-23 MED ORDER — POTASSIUM CHLORIDE CRYS ER 20 MEQ PO TBCR
40.0000 meq | EXTENDED_RELEASE_TABLET | ORAL | Status: AC
  Administered 2019-12-23 (×3): 40 meq via ORAL
  Filled 2019-12-23 (×3): qty 2

## 2019-12-23 MED ORDER — MAGNESIUM SULFATE 2 GM/50ML IV SOLN
2.0000 g | Freq: Once | INTRAVENOUS | Status: AC
  Administered 2019-12-23: 2 g via INTRAVENOUS
  Filled 2019-12-23: qty 50

## 2019-12-23 NOTE — Progress Notes (Signed)
Pt is complaining of a "different type of chest sensation" and "feels like her heart is shaking" when she usually gets a "butterflies in her chest" feeling  Denies pain or SOB  MD notified Banks preformed  Vitals stable  Will continue to monitor

## 2019-12-23 NOTE — Plan of Care (Signed)

## 2019-12-23 NOTE — Progress Notes (Signed)
   Subjective: Pt seen at the bedside this morning. Feeling weak and tired. Had an episode of chest palpitations/butterflies overnight with associated anxious feeling. EKG at that time noted to have sinus tach with PVC in bigeminy and fusion complexes. Pt does not believe hydroxyzine improved her anxiety at that time.  Overall today, pt expressed understanding for goal of continued improvement in volume status while inpatient and outpatient follow-up for CRT. She is agreeable to these interventions to improve her quality of life, but overall is looking forward to going home.  Objective:  Vital signs in last 24 hours: Vitals:   12/22/19 1920 12/23/19 0525 12/23/19 0627 12/23/19 0638  BP: 124/88 99/80  114/73  Pulse: 96 (!) 107  69  Resp: 12 13    Temp: 98 F (36.7 C) 97.6 F (36.4 C)    TempSrc: Oral Oral    SpO2: 100% 97%  99%  Weight:   102.5 kg    Physical Exam Vitals and nursing note reviewed.  Constitutional:      Comments: Pt tired appearing, laying down in bed.  Cardiovascular:     Rate and Rhythm: Normal rate and regular rhythm.     Heart sounds: Murmur present.  Pulmonary:     Effort: Pulmonary effort is normal.     Comments: Breathing comfortably on room air. Musculoskeletal:     Comments: Unna boots removed. Improved LE edema, +1 pitting bilaterally.  Neurological:     Mental Status: She is alert.    Assessment/Plan:  Active Problems:   Acute exacerbation of CHF (congestive heart failure) (HCC)   Acute on chronic HFrEF (heart failure with reduced ejection fraction) ([place])  Ms. Brocato is a 48 year old F with significant PMH of metastatic carcinoid tumor, stage IIB ductal triple negative L breast cancer, HFrEF, nonischemic cardiomyopathy, and obesity, who presents with shortness of breath and a general unwell feelingconsistent with acute HFrEF exacerbation.   Acute on chronic heart failure exacerbation from NICM Frequent PVCs LBBB Echo with worsening of LV  function (EF 20%), severe tricuspid valve regurg and stenosis, moderate/severe pulmonic valve regurg with mild stenosis, and evidence of RV volume overload. Consistent with progression of carcinoid valve disease. - continuing to diuresis with net 2.1L negative with weight down 2.4kg - Unna boots removed secondary to discomfort - pt with continued high PVC burden on telemetry review - Cr 2.61 << 2.76 with minimal GFR improvement 21 << 19  Advanced heart failure consulted, appreciate their input in this case - co-ox improved to 59% on milrinone 0.134mcg - try ted hose - continue 80mg  IV lasix BID - on amiodarone 400mg  BID - Dr. Caryl Comes with EP to consult tomorrow - consider CRT with LV lead only oupatient - discontinued metoprolol, keep holding entresto/spiro - replete Mg and K to keep Mg > 2 and K > 4  Carcinoid tumor metastatic to the liver Stage IIB ductal triple negative L breast cancer Gets monthly somatuline IM injections for the carcinoid tumor. Infiltrating ductal carcinoma of L breast was diagnosed and treated 8 years ago with chemotherapy. - follow-up with Dr. Marin Olp in 3 weeks - nothing to do currently in the acute setting  Prior to Admission Living Arrangement: home Anticipated Discharge Location: home Barriers to Discharge: improvement in volume status  Dispo: Anticipated discharge in approximately 2-3 day(s).   Ladona Horns, MD 12/23/2019, 7:17 AM Pager: [phone]

## 2019-12-23 NOTE — Progress Notes (Addendum)
Advanced Heart Failure Rounding Note  PCP-Cardiologist: Shirlee More, MD   Subjective:    CO-OX 59% on milrinone 0.125 mcg. Yesterday diuresed with IV lasix. Brisk diuresis noted.   Remains on amio 30 mg per hour.   Having a hard time sleeping. Denies SOB.    Objective:   Weight Range: 102.5 kg Body mass index is 36.48 kg/m.   Vital Signs:   Temp:  [97.6 F (36.4 C)-98 F (36.7 C)] 97.6 F (36.4 C) (03/16 0525) Pulse Rate:  [32-153] 100 (03/16 0843) Resp:  [0-33] 17 (03/16 0737) BP: (87-135)/(57-94) 115/80 (03/16 0843) SpO2:  [74 %-100 %] 94 % (03/16 0843) Weight:  [102.5 kg] 102.5 kg (03/16 0627) Last BM Date: 12/23/19  Weight change: Filed Weights   12/21/19 0439 12/22/19 0247 12/23/19 0627  Weight: 105.5 kg 104.9 kg 102.5 kg    Intake/Output:   Intake/Output Summary (Last 24 hours) at 12/23/2019 1042 Last data filed at 12/23/2019 0600 Gross per 24 hour  Intake 780.33 ml  Output 2650 ml  Net -1869.67 ml      Physical Exam  CVP 11-12  General:. No resp difficulty HEENT: normal anicteric  Neck: supple. JVP 11-12 . Carotids 2+ bilat; no bruits. No lymphadenopathy or thryomegaly appreciated. Cor: PMI nondisplaced. Regular rate & rhythm. No rubs, gallops or murmurs. Lungs: clear no wheeze Abdomen: obese soft, nontender, nondistended. No hepatosplenomegaly. No bruits or masses. Good bowel sounds. Extremities: no cyanosis, clubbing, rash, R and LLE 1-2+ RUE PICC  Neuro: alert & oriented x 3, cranial nerves grossly intact. moves all 4 extremities w/o difficulty. Affect pleasant   Telemetry   SR with frequent PVCs 90-100s  EKG    N/a   Labs    CBC Recent Labs    12/21/19 0523 12/23/19 0325  WBC 5.5 5.6  NEUTROABS 2.7  --   HGB 13.4 12.7  HCT 43.6 40.4  MCV 90.1 90.2  PLT 205 563   Basic Metabolic Panel Recent Labs    12/21/19 0523 12/21/19 0523 12/22/19 0719 12/23/19 0325  NA 139   < > 140 138  K 3.0*   < > 3.6 3.1*  CL 99   < >  97* 98  CO2 28   < > 27 28  GLUCOSE 109*   < > 119* 139*  BUN 31*   < > 35* 34*  CREATININE 2.73*   < > 2.76* 2.61*  CALCIUM 8.8*   < > 9.1 8.6*  MG 1.8   < > 2.1 1.7  PHOS 4.1  --   --   --    < > = values in this interval not displayed.   Liver Function Tests Recent Labs    12/21/19 0523  AST 19  ALT 17  ALKPHOS 94  BILITOT 1.3*  PROT 6.4*  ALBUMIN 3.3*   No results for input(s): LIPASE, AMYLASE in the last 72 hours. Cardiac Enzymes No results for input(s): CKTOTAL, CKMB, CKMBINDEX, TROPONINI in the last 72 hours.  BNP: BNP (last 3 results) Recent Labs    07/21/19 1334 10/13/19 1300 12/20/19 0700  BNP 69.8 301.6* 382.1*    ProBNP (last 3 results) Recent Labs    04/09/19 1458 05/20/19 1611 12/17/19 1532  PROBNP 469* 1,071* 5,909*     D-Dimer No results for input(s): DDIMER in the last 72 hours. Hemoglobin A1C No results for input(s): HGBA1C in the last 72 hours. Fasting Lipid Panel No results for input(s): CHOL, HDL, LDLCALC, TRIG, CHOLHDL,  LDLDIRECT in the last 72 hours. Thyroid Function Tests Recent Labs    12/21/19 0523  TSH 3.218    Other results:   Imaging    [place] EKG SITE RITE  Result Date: 12/22/2019 If Site Rite image not attached, placement could not be confirmed due to current cardiac rhythm.    Medications:     Scheduled Medications: . amiodarone  400 mg Oral BID  . Chlorhexidine Gluconate Cloth  6 each Topical Daily  . enoxaparin (LOVENOX) injection  40 mg Subcutaneous Q24H  . furosemide  80 mg Intravenous BID  . pneumococcal 23 valent vaccine  0.5 mL Intramuscular Tomorrow-1000  . potassium chloride  40 mEq Oral Q4H  . sodium chloride flush  10-40 mL Intracatheter Q12H    Infusions: . milrinone 0.125 mcg/kg/min (12/23/19 0924)    PRN Medications: acetaminophen **OR** acetaminophen, hydrOXYzine, sodium chloride flush     Assessment/Plan   1. Acute systolic HF due to NICM - Echo 2013 EF 25-30%. Cath normal  cors - Echo 2020 EF 45-50% - Echo 12/19/19: EF 20% septal DK. RV ok TV restricted severe TR  - suspect LV dysfunction due to PVCs +/- LBBB. PVCs come in bursts not consistently high ? If burden high enough to cause CM - CO-OX improved to 59% on milrinone 0.125 mcg. - CVP 11-12. Volume status improving. Continue IV lasix.  -  Hold b-blocker - Off Entresto/spiro due to AKI -  Continue  ted hose.  - Per Dr Haroldine Laws. Although prognosis seems quite poor with several major health issues including metastatic carcinoid syndrome, I think the main issue now is likely low output HF in setting of profound cardiomyopathy wither due to PVCs +/- LBBB. Will attempt to suppress PVCs with amio and support with short-term inotropes as needed to help improve symptoms and facilitate volume removal and renal recovery. Will d/w EP   2. Frequent PVCs. - Zio Patch performed 04/07/2019 showed frequent PVCs with a 7.4% burden  - On tele, PVCs come in bursts not consistently high.  ? If burden high enough to cause CM.  - EP consulted. Consider LV lead after discharge by Dr Caryl Comes  - Initially on IV amio and now switched to 400 mg twice a day.  - Dr Caryl Comes consulting tomorrow. . - keep K> 4.0, Mg > 2.0  3. Acute on CKD3 - creatinine 1.3 in 11/20 -> 1.7 1/21 -> - Creatinine  2.7>2.6   - suspect due to low output/cardiorenal  4. 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 Metastatic carcinoid syndrome with liver mets - followed by Dr. Marin Olp - on somatuline  6. LBBB/IVCD - ideally might benefit from CRT if LV does not respond to PVC suppression but prognosis may prohibit  7. Hypokalemia/Hypomagnesemia - supp to keep K> 4.0, Mg > 2.0 -K  And Mag given this morning.   Consult cardiac rehab.   Length of Stay: 2  Darrick Grinder, NP  12/23/2019, 10:42 AM  Advanced Heart Failure Team Pager [phone] (M-F; 7a - 4p)  Please contact [facility] for night-coverage after hours (4p -7a ) and weekends on  [email or web address]  Patient seen and examined with the above-signed Advanced Practice Provider and/or Housestaff. I personally reviewed laboratory data, imaging studies and relevant notes. I independently examined the patient and formulated the important aspects of the plan. I have edited the note to reflect any of my changes or salient points. I have personally discussed the plan with the patient and/or family.  She is now on milrinone for inotropic support. Co-ox improved. Diuresing well on IV lasix. Weight down 5 pounds. On amio for PVC suppression.   I suspect worsening LV function likely related to frequent PVCs +/ LBBB. Have started amio to help suppress PVCs. Case reviewed in detail with Dr. Caryl Comes and previous ECGs and echos reviewed. We both feel that CRT with LV lead only may possibly help her. He will see her tomorrow to discuss timing.   Continue milrinone and IV diuresis. Watch creatinine closely.   Glori Bickers, MD  4:03 PM

## 2019-12-23 NOTE — Progress Notes (Signed)
[id number] Came to see pt to walk. Pt sleepy. Does not feel up to walking at this time. Will try tomorrow. Gave pt CHF booklet and low sodium diets. Discussed daily weights, when to call MD with signs and symptoms, 2L FR, and 2000 mg sodium restriction. Pt stated at home her walking depends on how she feels. Sometimes she just walks to bathroom. Will follow up with pt as time permits. Graylon Good RN BSN 12/23/2019 3:18 PM

## 2019-12-24 DIAGNOSIS — Z95828 Presence of other vascular implants and grafts: Secondary | ICD-10-CM

## 2019-12-24 DIAGNOSIS — I071 Rheumatic tricuspid insufficiency: Secondary | ICD-10-CM

## 2019-12-24 DIAGNOSIS — I447 Left bundle-branch block, unspecified: Secondary | ICD-10-CM

## 2019-12-24 DIAGNOSIS — I50813 Acute on chronic right heart failure: Secondary | ICD-10-CM

## 2019-12-24 LAB — BASIC METABOLIC PANEL
Anion gap: 15 (ref 5–15)
BUN: 35 mg/dL — ABNORMAL HIGH (ref 6–20)
CO2: 25 mmol/L (ref 22–32)
Calcium: 8.9 mg/dL (ref 8.9–10.3)
Chloride: 95 mmol/L — ABNORMAL LOW (ref 98–111)
Creatinine, Ser: 2.59 mg/dL — ABNORMAL HIGH (ref 0.44–1.00)
GFR calc Af Amer: 24 mL/min — ABNORMAL LOW (ref >60)
GFR calc non Af Amer: 21 mL/min — ABNORMAL LOW (ref >60)
Glucose, Bld: 267 mg/dL — ABNORMAL HIGH (ref 70–99)
Potassium: 3.6 mmol/L (ref 3.5–5.1)
Sodium: 135 mmol/L (ref 135–145)

## 2019-12-24 LAB — COOXEMETRY PANEL
Carboxyhemoglobin: 1.1 % (ref 0.5–1.5)
Methemoglobin: 1 % (ref 0.0–1.5)
O2 Saturation: 60.9 %
Total hemoglobin: 13.9 g/dL (ref 12.0–16.0)

## 2019-12-24 LAB — MAGNESIUM: Magnesium: 1.9 mg/dL (ref 1.7–2.4)

## 2019-12-24 MED ORDER — POTASSIUM CHLORIDE CRYS ER 20 MEQ PO TBCR
40.0000 meq | EXTENDED_RELEASE_TABLET | Freq: Two times a day (BID) | ORAL | Status: DC
  Administered 2019-12-24 (×2): 40 meq via ORAL
  Filled 2019-12-24 (×2): qty 2

## 2019-12-24 MED ORDER — MAGNESIUM SULFATE 2 GM/50ML IV SOLN
2.0000 g | Freq: Once | INTRAVENOUS | Status: AC
  Administered 2019-12-24: 2 g via INTRAVENOUS
  Filled 2019-12-24: qty 50

## 2019-12-24 MED ORDER — RAMELTEON 8 MG PO TABS
8.0000 mg | ORAL_TABLET | Freq: Every day | ORAL | Status: DC
  Administered 2019-12-24 – 2020-01-02 (×9): 8 mg via ORAL
  Filled 2019-12-24 (×12): qty 1

## 2019-12-24 MED ORDER — METOLAZONE 2.5 MG PO TABS
2.5000 mg | ORAL_TABLET | Freq: Once | ORAL | Status: AC
  Administered 2019-12-24: 2.5 mg via ORAL
  Filled 2019-12-24: qty 1

## 2019-12-24 NOTE — Consult Note (Signed)
Cardiology Consultation:   Patient ID: Amber Bautista MRN: [id number]; DOB: Dec 16, 1970  Admit date: 12/20/2019 Date of Consult: 12/24/2019  Primary Care Provider: Jolinda Croak, MD Primary Cardiologist: Amber More, MD  Primary Electrophysiologist:  Dr. Juleen Bautista   Patient Profile:   Amber Bautista is a 49 y.o. female with a hx of NICM, chronic CHF (systolic), HTN, LBBB, metastatic carcinoid with liver involvement,  h/ostage IIb ductal carcinoma of left breast (s/p adjuvant chemotherapy with 4 cycles of carboplatinum/Taxotere co mpleted 8 years ago), PVCs who is being seen today for the evaluation of recurrent and significant reduction in LVEF, PVCs at the request of Dr. Haroldine Bautista.  following with Dr. Marin Bautista for h/o breast CA and metastatic carcinoid tumor. Remains on somatuline injections for her carcinoid 05/14/2019: PET scan IMPRESSION: 1. Intense radiotracer activity associated with multi-system metastasis consistent with metastatic well-differentiated neuroendocrine tumor. 2. Primary lesion may well be in the head of the pancreas witha 2 cm lesion. 3. Extensive neuroendocrine tumor hepatic metastasis with large lesion in the LEFT hepatic lobe and multiple smaller lesions the RIGHT hepatic lobe. 4. Nodal metastasis within the mediastinum and periaortic upper abdomen. 5. Peritoneal nodal metastasis with small lesions in the ventral peritoneal space and larger peritoneal implants in the deep pelvis. 6. No evidence skeletal metastasis.  History of Present Illness:   Ms. Amber Bautista follows with Dr. Gerarda Bautista, his notes have noted historically her EF was 25-30% mild LA enlargement, mild MR, severe LV dilation, TTE done Aug 2020 noted EF 45-50%, also is found to have septal hypokinesis consistent w/ her known LBBB, mod-severe TR  She was admitted to [facility] 2/2 syncope.  In d/w Dr. Loletha Bautista, this sounded very much of vagal episode occurring with lightheadedness after having a BM followed by syncope   In Oct 2020 Cardiology was called given mildly abn HS Trop, and observed frequent PVCs on monitor.  Her event felt to be vagally medicated  She was noted hypokalemic and replaced EP is asked to weigh in on PVCs, CM She had worn a Zio monitor July 2020 with PVC burden of 7.4 %, and given her echo with LVEF done after this with an improved LVEF, no changes were recommended  She was readmitted to [facility] this stay on 12/20/2019, with acute onset of SOB and sense of impending doom, Reports dyspnea with any activity and severe fatigue. Reported often at night feeling her heart beating hard. No CP In the ED, EKG with frequent PVCs. CXR with no evidence of acute cardiopulmonary disease. BNP down. echo this admission with marked decline in her LVEF 20% with severely dilated LV with septal DK. RV normal. TV thickened and restricted with wide-open TR  EP is asked to see regarding possibility/potential benefit of left bundle pacing, and if PVCs felt to be impacting LVEF   Past Medical History:  Diagnosis Date   Abdominal pain 12/17/2014   Breast cancer ([place]) 07/12/12   Stage IIB Ductal Carcinoma to  Upper Outer Quadrant: Left Breast: Triple Negative   Cancer ([place]) 03/03/2015   Overview:  in intestine-2007, in liver-2007, and left breast-2013   Cancer of upper-outer quadrant of female breast ([place]) 08/01/2012   Image guided biopsy Oct., 2013 -    2 cm by MRI, solitary;     IDC; TNBC; positive LVI;       Carcinoid tumor of intestine 2001   Mets 2007   Carcinoma of breast treated with adjuvant chemotherapy ([place])    4 cycles of Carboplatin /  Taxol   Chronic congestive heart failure ([place]) 03/17/2019   Congestive heart failure (CHF) ([facility])    Diarrhea    Sandostatin   Frequent PVCs 04/01/2019   Goals of care, counseling/discussion 03/31/2019   Hypertension    Metastatic carcinoid tumor ([place])    Morbidly obese ([place])    Personal history of chemotherapy 2013   Left Breast Cancer   Personal history of radiation  therapy 2013   Left Breast Cancer   S/P radiation therapy 02/04/2013-02/21/2013   Left Breast and Axilla / 46.8 Gy in 26 fractions were planned (she only received 18 Gy in 10 fractions)   Syncope 07/2019    Past Surgical History:  Procedure Laterality Date   BREAST BIOPSY Left 11/2019   BREAST LUMPECTOMY Left 2013   BREAST SURGERY  08/23/12   Lt br lumpectomy   CESAREAN SECTION  1988 2007   COLON SURGERY  2001   Cancer/ Intestinal Resection   OVARIAN CYST REMOVAL  2001   PARTIAL MASTECTOMY WITH NEEDLE LOCALIZATION AND AXILLARY SENTINEL LYMPH NODE BX  08/23/2012   Procedure: PARTIAL MASTECTOMY WITH NEEDLE LOCALIZATION AND AXILLARY SENTINEL LYMPH NODE BX;  Surgeon: Amber Hector, MD;  Location: [place];  Service: General;  Laterality: Left;  blue dye injection    PORTACATH PLACEMENT  08/23/2012   Procedure: INSERTION PORT-A-CATH;  Surgeon: Amber Hector, MD;  Location: [place];  Service: General;  Laterality: Right;  intraop ultrasound     Home Medications:  Prior to Admission medications   Medication Sig Start Date End Date Taking? Authorizing Provider  calcium carbonate (TUMS - DOSED IN MG ELEMENTAL CALCIUM) 500 MG chewable tablet Chew 1 tablet by mouth daily as needed for indigestion or heartburn.   Yes [provider]  furosemide (LASIX) 40 MG tablet Take 1 tab by mouth daily if weight is 230 lbs or less, take 2 tabs by mouth daily if weight is 231lbs or greater. 12/17/19  Yes Amber Priest, MD  magnesium chloride (SLOW-MAG) 64 MG TBEC SR tablet Take 1 tablet (64 mg total) by mouth 2 (two) times daily. Patient taking differently: Take 1 tablet by mouth daily.  06/20/19  Yes Amber Dubonnet, NP  metoprolol succinate (TOPROL-XL) 50 MG 24 hr tablet Take 50 mg by mouth daily. Take with or immediately following a meal.   Yes [provider]  potassium chloride SA (KLOR-CON) 20 MEQ tablet Take 2 tablets (40 mEq total) by mouth 2 (two) times daily. 10/16/19  Yes Amber Priest, MD  sacubitril-valsartan (ENTRESTO) 49-51 MG Take 1 tablet by mouth 2 (two) times daily. 08/14/19  Yes Amber Dubonnet, NP  naltrexone (DEPADE) 50 MG tablet Take 0.5 tablets (25 mg total) by mouth daily. Patient not taking: Reported on 12/20/2019 11/20/19   Volanda Napoleon, MD    Inpatient Medications: Scheduled Meds:  amiodarone  400 mg Oral BID   Chlorhexidine Gluconate Cloth  6 each Topical Daily   enoxaparin (LOVENOX) injection  40 mg Subcutaneous Q24H   furosemide  80 mg Intravenous BID   pneumococcal 23 valent vaccine  0.5 mL Intramuscular Tomorrow-1000   potassium chloride  40 mEq Oral BID   ramelteon  8 mg Oral QHS   sodium chloride flush  10-40 mL Intracatheter Q12H   Continuous Infusions:  milrinone 0.125 mcg/kg/min (12/24/19 0815)   PRN Meds: acetaminophen **OR** acetaminophen, hydrOXYzine, sodium chloride flush  Allergies:    Allergies  Allergen Reactions   Sulfamethoxazole-Trimethoprim Swelling and Hypertension  Swelling and hypertension after taking, was admitted to hospital   Adhesive [Tape] Rash and Other (See Comments)    Causes SEVERE RASHES AND BLISTERS    Social History:   Social History   Socioeconomic History   Marital status: Divorced    Spouse name: Not on file   Number of children: Not on file   Years of education: Not on file   Highest education level: Not on file  Occupational History   Not on file  Tobacco Use   Smoking status: Never Smoker   Smokeless tobacco: Never Used   Tobacco comment: NEVER SMOKED  Substance and Sexual Activity   Alcohol use: No    Alcohol/week: 0.0 standard drinks   Drug use: No   Sexual activity: Not Currently    Comment: menarche age 2, 8 3, G 1, 1 misscarriage  Other Topics Concern   Not on file  Social History Narrative   Not on file   Social Determinants of Health   Financial Resource Strain:    Difficulty of Paying Living Expenses:   Food Insecurity:    Worried About Sales executive in the Last Year:    Arboriculturist in the Last Year:   Transportation Needs:    Film/video editor (Medical):    Lack of Transportation (Non-Medical):   Physical Activity:    Days of Exercise per Week:    Minutes of Exercise per Session:   Stress:    Feeling of Stress :   Social Connections:    Frequency of Communication with Friends and Family:    Frequency of Social Gatherings with Friends and Family:    Attends Religious Services:    Active Member of Clubs or Organizations:    Attends Music therapist:    Marital Status:   Intimate Partner Violence:    Fear of Current or Ex-Partner:    Emotionally Abused:    Physically Abused:    Sexually Abused:     Family History:   Family History  Problem Relation Age of Onset   Heart attack Father    Diabetes Father    Hypertension Mother    Diabetes Mother    Glaucoma Mother    Hypertension Brother    Asthma Brother    Cancer Paternal Grandmother    Colon cancer Neg Hx    Esophageal cancer Neg Hx    Rectal cancer Neg Hx    Stomach cancer Neg Hx      ROS:  Please see the history of present illness.  All other ROS reviewed and negative.     Physical Exam/Data:   Vitals:   12/24/19 0100 12/24/19 0300 12/24/19 0408 12/24/19 0814  BP:  113/79 101/66 128/82  Pulse: (!) 53 67 69 79  Resp: 17 19 20 18   Temp:   98.3 F (36.8 C)   TempSrc:   Oral   SpO2: 98% 96% 95% 100%  Weight:   101.7 kg     Intake/Output Summary (Last 24 hours) at 12/24/2019 0957 Last data filed at 12/24/2019 0600 Gross per 24 hour  Intake 1053.22 ml  Output 1300 ml  Net -246.78 ml   Last 3 Weights 12/24/2019 12/23/2019 12/22/2019  Weight (lbs) 224 lb 3.2 oz 226 lb 231 lb 3.2 oz  Weight (kg) 101.696 kg 102.513 kg 104.872 kg     Body mass index is 36.19 kg/m.  General:  Well nourished, well developed, in modest distress HEENT: normal Lymph: no  adenopathy Neck:  JVP > jaw Endocrine:  No thryomegaly Vascular: No carotid  bruits; FA pulses 2+ bilaterally without bruits  Cardiac:  normal S1, S2; RRR;  Lungs:  clear to auscultation bilaterally, no wheezing, rhonchi or rales  Abd: soft, nontender, no hepatomegaly  Ext: no edema Musculoskeletal:  No deformities, BUE and BLE strength normal and equal Skin: warm and dry  Neuro:  CNs 2-12 intact, no focal abnormalities noted Psych:  Normal affect   EKG:  The EKG was personally reviewed and demonstrates:  Sinus LBBB  Telemetry:  Telemetry was personally reviewed and demonstrates:   SR generally about the 90s, frequent PVCs, rare NSVT 2-5 beats noted  Relevant CV Studies:  Echo 05/23/19 1. Mild hypokinesis of the left ventricular, entire septal wall.  2. The left ventricle has mildly reduced systolic function, with an ejection Bautista of 45-50%. The cavity size was normal. There is moderately increased left ventricular wall thickness. Left ventricular diastolic Doppler parameters are consistent with  impaired relaxation.  3. The right ventricle has normal systolic function. The cavity was normal. There is no increase in right ventricular wall thickness.  4. Right atrial size was mildly dilated.  5. Tricuspid valve regurgitation is moderate-severe.  6. Mild pulmonic stenosis.  7. The aorta is normal unless otherwise noted.  8. The inferior vena cava was normal in size with <50% respiratory variability.  9. TDS, cinsider MRI for better assesment of RV and TR     05/02/19 Zio monitor A ZIO monitor was used for 7 days beginning 04/07/2019 to assess ventricular ectopy in the setting of dilated cardiomyopathy.   The rhythm was sinus throughout the recording with minimum average and maximum heart rates of 58, 93 and 131 bpm.   There were no pauses of 3 seconds or greater.   There were no triggered or diary events.   Ventricular ectopy was frequent 7.4% incidence of isolated PVCs less than 1% incidence of couplets and triplets.  The longest episode of bigeminy 23  minutes 28 seconds in the longest episode of trigeminy 1 minute 26 seconds.   Conclusion frequent ventricular ectopy     Dr. Bettina Gavia noted report of prior w/u Echo 08/2012 LVEF 25-30%, mild LA enlargement, mild MR, severe LV dilation. Seen by cardiology at [facility] underwent heart cath showing "very weak heart muscle and normal coronary arteries".   Laboratory Data:  High Sensitivity Troponin:   Recent Labs  Lab 12/20/19 0700 12/20/19 0842  TROPONINIHS 29* 35*     Chemistry Recent Labs  Lab 12/22/19 0719 12/23/19 0325 12/24/19 0417  NA 140 138 135  K 3.6 3.1* 3.6  CL 97* 98 95*  CO2 27 28 25   GLUCOSE 119* 139* 267*  BUN 35* 34* 35*  CREATININE 2.76* 2.61* 2.59*  CALCIUM 9.1 8.6* 8.9  GFRNONAA 19* 21* 21*  GFRAA 22* 24* 24*  ANIONGAP 16* 12 15    Recent Labs  Lab 12/19/19 1344 12/20/19 0842 12/21/19 0523  PROT 7.1 6.9 6.4*  ALBUMIN 4.0 3.5 3.3*  AST 14* 19 19  ALT 12 16 17   ALKPHOS 101 95 94  BILITOT 0.9 1.3* 1.3*   Hematology Recent Labs  Lab 12/20/19 0700 12/21/19 0523 12/23/19 0325  WBC 6.6 5.5 5.6  RBC 5.08 4.84 4.48  HGB 14.0 13.4 12.7  HCT 46.1* 43.6 40.4  MCV 90.7 90.1 90.2  MCH 27.6 27.7 28.3  MCHC 30.4 30.7 31.4  RDW 18.4* 18.3* 18.3*  PLT 217 205 198  BNP Recent Labs  Lab 12/17/19 1532 12/27/2019 0700  BNP  --  382.1*  PROBNP 5,909*  --     DDimer No results for input(s): DDIMER in the last 168 hours.   Radiology/Studies:   US RENAL Result Date: Dec 27, 2019 CLINICAL DATA:  49 year old female with acute kidney injury. EXAM: RENAL / URINARY TRACT ULTRASOUND COMPLETE COMPARISON:  04/21/2019 CT FINDINGS: Right Kidney: Renal measurements: 10.8 x 4.6 x 6.1 cm = volume: 158 mL . Echogenicity within normal limits. No mass or hydronephrosis visualized. Left Kidney: Renal measurements: 10.4 x 4.5 x 4.8 cm = volume: 117 mL. Echogenicity within normal limits. No mass or hydronephrosis visualized. Bladder: Appears normal for  degree of bladder distention. Other: None. IMPRESSION: Unremarkable renal ultrasound. Electronically Signed   By: Margarette Canada M.D.   On: 12-27-2019 12:47     {   Assessment and Plan:   LBBB Carcinoid Heart disease-metastatic  LV dysfunction CHF chronic/acute systolic on iontropes  Sinus tachycardia Breast CA--L  TR severe      Signed, Baldwin Jamaica, PA-C  12/24/2019 9:57 AM  Pt has severe BiVentricular failure, and it is not clear to me as to why she has LV dysfunction--one of the hypotheses Is that she has LBBB induced cardiomyopathy.  In 2013 she had LV dysfunction with LBBB--w EF 25 % and the intervening ECGs showed a narrow QRS with much improved EF until she presented again this winter with QRS widening and LV dysfunction raising the spectre as to whether the LV cardiomyopathy is 2/2 LBBB  It is possible also that the LBBB is rate related--and so the use of ivabradine to slow the HR down may be illuminating and may indeed improve LV function  Will follow

## 2019-12-24 NOTE — Evaluation (Signed)
Physical Therapy Evaluation Patient Details Name: Amber Bautista MRN: [id number] DOB: 26-Apr-1971 Today's Date: 12/24/2019   History of Present Illness  Pt is a 49 yo female presenting with SOB, dx with CHF exacerbation upon arrival to ED. PMH includes: HFrEF, nonischemic cardiomyopathy, obesity, hypertension, LBBB, frequent PVCs followed by EP, stage IIB ductal left breast carcinoma, and metastatic liver carcinoid.  Clinical Impression  Pt in bed upon arrival of PT, agreeable to evaluation at this time. Prior to admission the pt was independent with no AD in her home but use of a scooter for longer distances. The pt now presents with limitations in functional mobility, power, endurance, and strength due to above dx, and will continue to benefit from skilled PT to address these deficits. The pt was able to demo short ambulation in the room, with no LOB and no use of AD, but will continue to benefit from skilled PT to progress strengthening and endurance prior to d/c home.      Follow Up Recommendations No PT follow up;Supervision/Assistance - 24 hour    Equipment Recommendations  None recommended by PT    Recommendations for Other Services       Precautions / Restrictions Precautions Precautions: Fall Precaution Comments: pt reports some episodes of dizziness after having BM, resulting in her passing out, no other reports of fall. orthostatic (-) Restrictions Weight Bearing Restrictions: No      Mobility  Bed Mobility Overal bed mobility: Independent                Transfers Overall transfer level: Modified independent Equipment used: None             General transfer comment: pt able to demo without need for assist or AD, no evidence of dizziness, VSS  Ambulation/Gait Ambulation/Gait assistance: Supervision Gait Distance (Feet): 20 Feet Assistive device: None Gait Pattern/deviations: Step-through pattern;Decreased stride length   Gait velocity interpretation:  <1.31 ft/sec, indicative of household ambulator General Gait Details: Pt with slow but steady gait, no LOB, no use of AD, reports sid endurance limitaitons and use of scooter but no use of AD for ambulation  Stairs            Wheelchair Mobility    Modified Rankin (Stroke Patients Only)       Balance Overall balance assessment: Needs assistance Sitting-balance support: No upper extremity supported;Feet supported Sitting balance-Leahy Scale: Good Sitting balance - Comments: independent     Standing balance-Leahy Scale: Fair Standing balance comment: able to ambulate without AD                             Pertinent Vitals/Pain Pain Assessment: No/denies pain    Home Living Family/patient expects to be discharged to:: Private residence Living Arrangements: Parent;Children(mother, and 5 yo daughter with special needs, brother lives in same apt complex, works uring the day) Available Help at Discharge: Family Type of Home: Apartment Home Access: Level entry     Home Layout: One level [place]: Other (comment);Shower seat;Grab bars - tub/shower(mobilized scooter) Additional Comments: brother can help, but works during the day, pt cares for her elderly mother and teenage daughter. Pt reports she does most home care but does not drive, orders groceries.    Prior Function Level of Independence: Needs assistance   Gait / Transfers Assistance Needed: uses elevctric scooter for long distances           Hand Dominance   Dominant  Hand: Left    Extremity/Trunk Assessment   Upper Extremity Assessment Upper Extremity Assessment: Overall WFL for tasks assessed    Lower Extremity Assessment Lower Extremity Assessment: Overall WFL for tasks assessed(pt reports sig endurance deficits)    Cervical / Trunk Assessment Cervical / Trunk Assessment: Normal  Communication   Communication: No difficulties  Cognition Arousal/Alertness: Awake/alert Behavior  During Therapy: Flat affect;WFL for tasks assessed/performed Overall Cognitive Status: Within Functional Limits for tasks assessed                                 General Comments: pt agreeable but with very flat affect      General Comments      Exercises     Assessment/Plan    PT Assessment Patient needs continued PT services  PT Problem List Decreased strength;Decreased coordination;Decreased mobility;Decreased activity tolerance;Decreased balance       PT Treatment Interventions DME instruction;Therapeutic exercise;Gait training;Balance training;Functional mobility training;Cognitive remediation;Therapeutic activities;Patient/family education    PT Goals (Current goals can be found in the Care Plan section)  Acute Rehab PT Goals Patient Stated Goal: to figure out why she passes out sometimes PT Goal Formulation: With patient Time For Goal Achievement: 12/24/19 Potential to Achieve Goals: Fair    Frequency Min 3X/week   Barriers to discharge Decreased caregiver support pt is caregiver for daughter and mother, both with needs for assistance    Co-evaluation               AM-PAC PT "6 Clicks" Mobility  Outcome Measure Help needed turning from your back to your side while in a flat bed without using bedrails?: None Help needed moving from lying on your back to sitting on the side of a flat bed without using bedrails?: None Help needed moving to and from a bed to a chair (including a wheelchair)?: A Little Help needed standing up from a chair using your arms (e.g., wheelchair or bedside chair)?: A Little Help needed to walk in hospital room?: A Little Help needed climbing 3-5 steps with a railing? : A Little 6 Click Score: 20    End of Session Equipment Utilized During Treatment: Gait belt Activity Tolerance: Patient tolerated treatment well Patient left: in bed Nurse Communication: Mobility status PT Visit Diagnosis: Unsteadiness on feet  (R26.81);Difficulty in walking, not elsewhere classified (R26.2)    Time: 4628-6381 PT Time Calculation (min) (ACUTE ONLY): 21 min   Charges:   PT Evaluation $PT Eval Low Complexity: 1 Low          Karma Ganja, PT, DPT   Acute Rehabilitation Department Pager #: [phone]   Otho Bellows 12/24/2019, 4:46 PM

## 2019-12-24 NOTE — Progress Notes (Addendum)
Advanced Heart Failure Rounding Note  PCP-Cardiologist: Shirlee More, MD   Subjective:    Yesterday diuresed with IV lasix. Remains on milrinone 0.125 mcg. CO-OX stable at 61%   Creatinine 2.6>2.6   Weight down 2 pounds.   Complaining of fatigue. Not walking due to fatigue. Feels unsteady on her feet.   Objective:   Weight Range: 101.7 kg Body mass index is 36.19 kg/m.   Vital Signs:   Temp:  [97.5 F (36.4 C)-98.3 F (36.8 C)] 97.7 F (36.5 C) (03/17 1100) Pulse Rate:  [50-94] 64 (03/17 1008) Resp:  [15-21] 21 (03/17 1008) BP: (93-128)/(66-94) 110/94 (03/17 1008) SpO2:  [91 %-100 %] 99 % (03/17 1008) Weight:  [101.7 kg] 101.7 kg (03/17 0408) Last BM Date: 12/23/19  Weight change: Filed Weights   12/22/19 0247 12/23/19 0627 12/24/19 0408  Weight: 104.9 kg 102.5 kg 101.7 kg    Intake/Output:   Intake/Output Summary (Last 24 hours) at 12/24/2019 1411 Last data filed at 12/24/2019 1131 Gross per 24 hour  Intake 893.22 ml  Output 1500 ml  Net -606.78 ml      Physical Exam  CVP 18-19  General:  No resp difficulty HEENT: normal Neck: supple. JVP to jaw  Carotids 2+ bilat; no bruits. No lymphadenopathy or thryomegaly appreciated. Cor: PMI nondisplaced. Regular rate & rhythm. No rubs, gallops or murmurs. Lungs: clear Abdomen: soft, nontender, nondistended. No hepatosplenomegaly. No bruits or masses. Good bowel sounds. Extremities: no cyanosis, clubbing, rash, edema. RUE PICC  Neuro: alert & orientedx3, cranial nerves grossly intact. moves all 4 extremities w/o difficulty. Affect pleasant   Telemetry   SR 80-90s frequent PVCs  EKG    N/a   Labs    CBC Recent Labs    12/23/19 0325  WBC 5.6  HGB 12.7  HCT 40.4  MCV 90.2  PLT 710   Basic Metabolic Panel Recent Labs    12/23/19 0325 12/24/19 0417  NA 138 135  K 3.1* 3.6  CL 98 95*  CO2 28 25  GLUCOSE 139* 267*  BUN 34* 35*  CREATININE 2.61* 2.59*  CALCIUM 8.6* 8.9  MG 1.7 1.9    Liver Function Tests No results for input(s): AST, ALT, ALKPHOS, BILITOT, PROT, ALBUMIN in the last 72 hours. No results for input(s): LIPASE, AMYLASE in the last 72 hours. Cardiac Enzymes No results for input(s): CKTOTAL, CKMB, CKMBINDEX, TROPONINI in the last 72 hours.  BNP: BNP (last 3 results) Recent Labs    07/21/19 1334 10/13/19 1300 12/20/19 0700  BNP 69.8 301.6* 382.1*    ProBNP (last 3 results) Recent Labs    04/09/19 1458 05/20/19 1611 12/17/19 1532  PROBNP 469* 1,071* 5,909*     D-Dimer No results for input(s): DDIMER in the last 72 hours. Hemoglobin A1C No results for input(s): HGBA1C in the last 72 hours. Fasting Lipid Panel No results for input(s): CHOL, HDL, LDLCALC, TRIG, CHOLHDL, LDLDIRECT in the last 72 hours. Thyroid Function Tests No results for input(s): TSH, T4TOTAL, T3FREE, THYROIDAB in the last 72 hours.  Invalid input(s): FREET3  Other results:   Imaging    No results found.   Medications:     Scheduled Medications: . amiodarone  400 mg Oral BID  . Chlorhexidine Gluconate Cloth  6 each Topical Daily  . enoxaparin (LOVENOX) injection  40 mg Subcutaneous Q24H  . furosemide  80 mg Intravenous BID  . pneumococcal 23 valent vaccine  0.5 mL Intramuscular Tomorrow-1000  . potassium chloride  40 mEq  Oral BID  . ramelteon  8 mg Oral QHS  . sodium chloride flush  10-40 mL Intracatheter Q12H    Infusions: . milrinone 0.125 mcg/kg/min (12/24/19 0815)    PRN Medications: acetaminophen **OR** acetaminophen, hydrOXYzine, sodium chloride flush     Assessment/Plan   1. Acute systolic HF due to NICM - Echo 2013 EF 25-30%. Cath normal cors - Echo 2020 EF 45-50% - Echo 12/19/19: EF 20% septal DK. RV ok TV restricted severe TR  - suspect LV dysfunction due to PVCs +/- LBBB. PVCs come in bursts not consistently high ? If burden high enough to cause CM - CO-OX 61 % on milrinone 0.125 mcg. - CVP trending up to 18. Continue IV lasix 80  mg twice a day. Give 2.5 mg metolazone x1.  -  Hold b-blocker - Off Entresto/spiro due to AKI -  Continue  ted hose.  - Per Dr Haroldine Laws. Although prognosis seems quite poor with several major health issues including metastatic carcinoid syndrome, I think the main issue now is likely low output HF in setting of profound cardiomyopathy wither due to PVCs +/- LBBB. Will attempt to suppress PVCs with amio and support with short-term inotropes as needed to help improve symptoms and facilitate volume removal and renal recovery. Will d/w EP   2. Frequent PVCs. - Zio Patch performed 04/07/2019 showed frequent PVCs with a 7.4% burden  - On tele, PVCs come in bursts not consistently high.  ? If burden high enough to cause CM.  - EP consulted. Consider LV lead after discharge by Dr Caryl Comes  - Initially on IV amio and now switched to 400 mg twice a day. Continue  - Dr Caryl Comes consulted today. Appreciated his input. - keep K> 4.0, Mg > 2.0  3. Acute on CKD3 - creatinine 1.3 in 11/20 -> 1.7 1/21 -> - Creatinine  2.7>2.6  >2.6  - suspect due to low output/cardiorenal  4. 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 Metastatic carcinoid syndrome with liver mets - followed by Dr. Marin Olp - on somatuline  6. LBBB/IVCD - ideally might benefit from CRT if LV does not respond to PVC suppression - Dr Caryl Comes consulting today.    7. Hypokalemia/Hypomagnesemia - supp to keep K> 4.0, Mg > 2.0 -K  And Mag given this morning.   Cardiac Rehab consulted.  Length of Stay: 3  Amy Clegg, NP  12/24/2019, 2:11 PM  Advanced Heart Failure Team Pager [phone] (M-F; 7a - 4p)  Please contact [facility] for night-coverage after hours (4p -7a ) and weekends on [email or web address]  Patient seen and examined with the above-signed Advanced Practice Provider and/or Housestaff. I personally reviewed laboratory data, imaging studies and relevant notes. I independently examined the patient and formulated the important  aspects of the plan. I have edited the note to reflect any of my changes or salient points. I have personally discussed the plan with the patient and/or family.  Remains tenuous. Volume status worse with CVP 17-18. Now on milrinone. Co-ox improved. Continues with frequent PVCs. Renal function still elevated.   General:  Sitting up No resp difficulty HEENT: normal Neck: supple JVP to jaw. Carotids 2+ bilat; no bruits. No lymphadenopathy or thryomegaly appreciated. Cor: PMI nondisplaced. Irregular 3/6 TR Lungs: clear Abdomen: obese soft, nontender, nondistended. No hepatosplenomegaly. No bruits or masses. Good bowel sounds. Extremities: no cyanosis, clubbing, rash, 2+ edema Neuro: alert & orientedx3, cranial nerves grossly intact. moves all 4 extremities w/o difficulty. Affect pleasant  She remains very tenuous. CVP still quite high but accurate assessment of volume status confounded by severe TR. Will continue milrinone. Continue diuresis. Continue to suppress PVCs.   Will need RHC on Friday. Eventual CRT. Discussed with Dr. Caryl Comes personally.   Glori Bickers, MD  6:51 PM

## 2019-12-24 NOTE — Plan of Care (Signed)

## 2019-12-24 NOTE — Progress Notes (Signed)
1135 while RN at bedside patient reported lost control of hands while washing hands after toileting. RN assisted patient to bed, patient felt lightheaded. BP assessed, no visual changes, no mentation changes, no pain, PERRLA intact, plantar flexion and grips present; patient tearful. Patient stated 4-5 months prior felt lightheaded and fainted after toileting at home (possible vasovagal response and/or orthostatic hypotension).   Paged Int Med. Upon callback spoke with MD Ronnald Ramp, informed of event, symptoms subsided, neuro intact, no change in mentation, VS and HR in low 100's. No new orders, RN will continue to monitor patient. Physician will round on patient.

## 2019-12-24 NOTE — Progress Notes (Signed)
   Subjective: Pt seen at the bedside this morning. Endorses some trouble sleeping last night, but denies any additional episodes of palpitations or "funny feelings" in her chest. No acute concerns at this time.   Objective:  Vital signs in last 24 hours: Vitals:   12/23/19 1000 12/23/19 1729 12/23/19 2046 12/24/19 0408  BP: 118/77  93/78 101/66  Pulse: 66  94 69  Resp:   15 20  Temp:  (!) 97.5 F (36.4 C) 97.9 F (36.6 C) 98.3 F (36.8 C)  TempSrc:  Oral Oral Oral  SpO2: 96%  98% 95%  Weight:       Physical Exam Vitals and nursing note reviewed.  Constitutional:      General: She is not in acute distress.    Comments: Pt resting comfortably in bed this AM  Pulmonary:     Effort: Pulmonary effort is normal.  Musculoskeletal:     Comments: RUE PICC. Bilateral +1 pitting edema, improving with diuresis   Skin:    General: Skin is warm and dry.  Neurological:     Mental Status: She is alert.     Intake/Output Summary (Last 24 hours) at 12/24/2019 0900 Last data filed at 12/24/2019 0600 Gross per 24 hour  Intake 1053.22 ml  Output 1300 ml  Net -246.78 ml   Assessment/Plan:  Active Problems:   Acute exacerbation of CHF (congestive heart failure) (HCC)   Acute on chronic HFrEF (heart failure with reduced ejection fraction) ([place])  Ms. Gaugh is a 49 year old F with significant PMH of metastatic carcinoid tumor, stage IIB ductal triple negative L breast cancer, HFrEF, nonischemic cardiomyopathy, and obesity, who presents with shortness of breath and a general unwell feelingconsistent with acute HFrEF exacerbation.   Acute on chronic heart failure exacerbationfrom NICM Frequent PVCs LBBB Echo from 3/13 with worsening of LV function (EF 20%), severe tricuspid valve regurg and stenosis, moderate/severe pulmonic valve regurg with mild stenosis, and evidence of RV volume overload. Consistent with progression of carcinoid valve disease. - wt down another 0.8kg today - net  negative 250cc with 1.3L urine output - Cr 2.59 << 2.61 << 2.76 - GFR remains 21  Advanced heart failure consulted, appreciate their input in this case - co-ox improving on milrinone 0.112mcg - continue 80mg  IV lasix BID - on amiodarone 400mg  BID - consider CRT with LV lead only oupatient - discontinued metoprolol, keep holding entresto/spiro - pt not wearing ted hose -replete Mg and K to keep Mg > 2 and K > 4  EP to consult today, appreciate recommendations  Carcinoid tumor metastatic to the liver - monthly somatuline IM injections for the carcinoid tumor. - nothing to do acutely - continue outpatient follow-up with Dr. Marin Olp Stage IIB ductal triple negative L breast cancer - Infiltrating ductal carcinoma of L breast diagnosed and treated 45yr ago with chemo  Prior to Admission Living Arrangement: home Anticipated Discharge Location: home Barriers to Discharge: clinical improvement Dispo: Anticipated discharge in approximately 2-3 day(s).   Ladona Horns, MD 12/24/2019, 6:22 AM Pager: [phone]

## 2019-12-25 LAB — COOXEMETRY PANEL
Carboxyhemoglobin: 1.2 % (ref 0.5–1.5)
Methemoglobin: 0.9 % (ref 0.0–1.5)
O2 Saturation: 60 %
Total hemoglobin: 13.6 g/dL (ref 12.0–16.0)

## 2019-12-25 LAB — BASIC METABOLIC PANEL
Anion gap: 15 (ref 5–15)
BUN: 38 mg/dL — ABNORMAL HIGH (ref 6–20)
CO2: 29 mmol/L (ref 22–32)
Calcium: 9.3 mg/dL (ref 8.9–10.3)
Chloride: 96 mmol/L — ABNORMAL LOW (ref 98–111)
Creatinine, Ser: 3.03 mg/dL — ABNORMAL HIGH (ref 0.44–1.00)
GFR calc Af Amer: 20 mL/min — ABNORMAL LOW (ref >60)
GFR calc non Af Amer: 17 mL/min — ABNORMAL LOW (ref >60)
Glucose, Bld: 113 mg/dL — ABNORMAL HIGH (ref 70–99)
Potassium: 3.3 mmol/L — ABNORMAL LOW (ref 3.5–5.1)
Sodium: 140 mmol/L (ref 135–145)

## 2019-12-25 LAB — MAGNESIUM: Magnesium: 2.2 mg/dL (ref 1.7–2.4)

## 2019-12-25 MED ORDER — POTASSIUM CHLORIDE CRYS ER 20 MEQ PO TBCR: 40.0000 meq | EXTENDED_RELEASE_TABLET | Freq: Once | ORAL | Status: DC

## 2019-12-25 MED ORDER — POTASSIUM CHLORIDE CRYS ER 20 MEQ PO TBCR
40.0000 meq | EXTENDED_RELEASE_TABLET | Freq: Three times a day (TID) | ORAL | Status: DC
  Administered 2019-12-25: 40 meq via ORAL
  Filled 2019-12-25: qty 2

## 2019-12-25 MED ORDER — ENOXAPARIN SODIUM 30 MG/0.3ML ~~LOC~~ SOLN
30.0000 mg | SUBCUTANEOUS | Status: DC
  Administered 2019-12-25 – 2020-01-03 (×10): 30 mg via SUBCUTANEOUS
  Filled 2019-12-25 (×10): qty 0.3

## 2019-12-25 MED ORDER — SODIUM CHLORIDE 0.9% FLUSH: 3.0000 mL | Freq: Two times a day (BID) | INTRAVENOUS | Status: DC

## 2019-12-25 MED ORDER — POTASSIUM CHLORIDE CRYS ER 20 MEQ PO TBCR
20.0000 meq | EXTENDED_RELEASE_TABLET | Freq: Once | ORAL | Status: AC
  Administered 2019-12-25: 20 meq via ORAL
  Filled 2019-12-25: qty 1

## 2019-12-25 NOTE — Care Management Important Message (Signed)
Important Message  Patient Details  Name: Amber Bautista MRN: [id number] Date of Birth: 01-25-1971   Medicare Important Message Given:  Yes     Shelda Altes 12/25/2019, 12:40 PM

## 2019-12-25 NOTE — Progress Notes (Addendum)
Advanced Heart Failure Rounding Note  PCP-Cardiologist: Shirlee More, MD   Subjective:    Co-ox 60% on 0.125 of milrinone.    Poor UOP despite IV Lasix, only 950 cc out yesterday. CVP elevated 16-18.   SCr rising, 2.59>>3.03    Wt unchanged, still 224lb today.   Laying flat on back in bed w/o respiratory difficulty. Denies dyspnea. No supp O2 requirements. Denies CP.   Continues w/ frequent PVCs on tele ~25/hr. Occasional trigeminy.    Objective:   Weight Range: 102 kg Body mass index is 36.3 kg/m.   Vital Signs:   Temp:  [97.5 F (36.4 C)-98.2 F (36.8 C)] 98.2 F (36.8 C) (03/18 0726) Pulse Rate:  [46-98] 94 (03/18 0726) Resp:  [14-24] 24 (03/18 0726) BP: (92-135)/(51-94) 106/60 (03/18 0726) SpO2:  [95 %-100 %] 96 % (03/18 0726) Weight:  [102 kg] 102 kg (03/18 0058) Last BM Date: 12/24/19  Weight change: Filed Weights   12/23/19 0627 12/24/19 0408 12/25/19 0058  Weight: 102.5 kg 101.7 kg 102 kg    Intake/Output:   Intake/Output Summary (Last 24 hours) at 12/25/2019 0805 Last data filed at 12/25/2019 0500 Gross per 24 hour  Intake 1600.94 ml  Output 950 ml  Net 650.94 ml      Physical Exam  CVP 16-18 General:  Obese well appearing female. No respiratory difficulty HEENT: normal Neck: supple. Elevated JVD to ear. Carotids 2+ bilat; no bruits. No lymphadenopathy or thyromegaly appreciated. Cor: PMI nondisplaced. Irregular (frequent PVCs), mildly tachy rate. No rubs, gallops or murmurs. Lungs: clear Abdomen: obese, soft, nontender, nondistended. No hepatosplenomegaly. No bruits or masses. Good bowel sounds. Extremities: no cyanosis, clubbing, rash, 1+ bilateral LEE edema Neuro: alert & oriented x 3, cranial nerves grossly intact. moves all 4 extremities w/o difficulty. Affect pleasant.   Telemetry   Sinus tach 101 w/ frequent PVCs + trigeminy. ~25 PVCs/HR EKG    N/a   Labs    CBC Recent Labs    12/23/19 0325  WBC 5.6  HGB 12.7  HCT  40.4  MCV 90.2  PLT 956   Basic Metabolic Panel Recent Labs    12/24/19 0417 12/25/19 0500  NA 135 140  K 3.6 3.3*  CL 95* 96*  CO2 25 29  GLUCOSE 267* 113*  BUN 35* 38*  CREATININE 2.59* 3.03*  CALCIUM 8.9 9.3  MG 1.9 2.2   Liver Function Tests No results for input(s): AST, ALT, ALKPHOS, BILITOT, PROT, ALBUMIN in the last 72 hours. No results for input(s): LIPASE, AMYLASE in the last 72 hours. Cardiac Enzymes No results for input(s): CKTOTAL, CKMB, CKMBINDEX, TROPONINI in the last 72 hours.  BNP: BNP (last 3 results) Recent Labs    07/21/19 1334 10/13/19 1300 12/20/19 0700  BNP 69.8 301.6* 382.1*    ProBNP (last 3 results) Recent Labs    04/09/19 1458 05/20/19 1611 12/17/19 1532  PROBNP 469* 1,071* 5,909*     D-Dimer No results for input(s): DDIMER in the last 72 hours. Hemoglobin A1C No results for input(s): HGBA1C in the last 72 hours. Fasting Lipid Panel No results for input(s): CHOL, HDL, LDLCALC, TRIG, CHOLHDL, LDLDIRECT in the last 72 hours. Thyroid Function Tests No results for input(s): TSH, T4TOTAL, T3FREE, THYROIDAB in the last 72 hours.  Invalid input(s): FREET3  Other results:   Imaging    No results found.   Medications:     Scheduled Medications: . amiodarone  400 mg Oral BID  . Chlorhexidine Gluconate Cloth  6 each Topical Daily  . enoxaparin (LOVENOX) injection  40 mg Subcutaneous Q24H  . furosemide  80 mg Intravenous BID  . pneumococcal 23 valent vaccine  0.5 mL Intramuscular Tomorrow-1000  . potassium chloride  40 mEq Oral TID  . ramelteon  8 mg Oral QHS  . sodium chloride flush  10-40 mL Intracatheter Q12H    Infusions: . milrinone 0.125 mcg/kg/min (12/24/19 0815)    PRN Medications: acetaminophen **OR** acetaminophen, hydrOXYzine, sodium chloride flush     Assessment/Plan   1. Acute systolic HF due to NICM - Echo 2013 EF 25-30%. Cath normal cors - Echo 2020 EF 45-50% - Echo 12/19/19: EF 20% septal DK. RV  ok TV restricted severe TR  - suspect LV dysfunction due to PVCs +/- LBBB. PVCs come in bursts not consistently high ? If burden high enough to cause CM - CO-OX marginal, 60 % on milrinone 0.125 mcg. - Poor diuresis despite IV Lasix w/ rising SCr, now >3. BP soft. CVP still elevated, however accurate assessment of volume status confounded by severe TR. She is not highly symptomatic. Denies dyspnea. Laying flat w/o orthopnea/PND.  - Will hold lasix today and plan RHC tomorrow.  -  Hold b-blocker - Off Entresto/spiro due to AKI - Per Dr Haroldine Laws. Although prognosis seems quite poor with several major health issues including metastatic carcinoid syndrome, I think the main issue now is likely low output HF in setting of profound cardiomyopathy wither due to PVCs +/- LBBB. Will attempt to suppress PVCs with amio and support with short-term inotropes as needed to help improve symptoms and facilitate volume removal and renal recovery.    2. Frequent PVCs. - Zio Patch performed 04/07/2019 showed frequent PVCs with a 7.4% burden  - On tele, continues to have frequent PVCs ~25/hr - EP consulted. Consider LV lead after discharge by Dr Caryl Comes  - Initially on IV amio and now switched to 400 mg twice a day. Continue  - keep K> 4.0, Mg > 2.0  3. Acute on CKD3 - creatinine 1.3 in 11/20 -> 1.7 1/21 -> - Creatinine  2.7>2.6  >2.6>>3.03  - suspect due to low output/cardiorenal - continue milrinone.  - if renal function continues to worsen, may need to change from milrinone to dobutamine (although not ideal w/ PVCs, would be more arrhythmogenic)  - hold Lasix today.  - Follow BMP   4. 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 Metastatic carcinoid syndrome with liver mets - followed by Dr. Marin Olp - on somatuline  6. LBBB/IVCD - ideally might benefit from CRT if LV does not respond to PVC suppression - EP to evaluate   7. Hypokalemia/Hypomagnesemia - supp to keep K> 4.0, Mg > 2.0 - K  3.3 today. Supp KCl ordered. Will reduce to 40 meq x 1, given worsening SCr and lasix hold  -follow BMP   Length of Stay: [address] PA-C  12/25/2019, 8:05 AM  Advanced Heart Failure Team Pager [phone] (M-F; 7a - 4p)  Please contact [facility] for night-coverage after hours (4p -7a ) and weekends on [email or web address]  Patient seen and examined with the above-signed Advanced Practice Provider and/or Housestaff. I personally reviewed laboratory data, imaging studies and relevant notes. I independently examined the patient and formulated the important aspects of the plan. I have edited the note to reflect any of my changes or salient points. I have personally discussed the plan with the patient and/or family.  Remains very tenuous.  On milrinone and lasix gtt. CVP still up but now creatinine rising. Weight unchanged. Co-ox 60%   On amio to suppress PVCs but still with intermittent periods of high burden.   General:  Sitting up in bed . No resp difficulty HEENT: normal Neck: supple. JVP to ear Carotids 2+ bilat; no bruits. No lymphadenopathy or thryomegaly appreciated. Cor: PMI nondisplaced. Regular rate & rhythm. 3/6 TR Lungs: clear Abdomen: obese soft, nontender, nondistended. No hepatosplenomegaly. No bruits or masses. Good bowel sounds. Extremities: no cyanosis, clubbing, rash, 2+ edema Neuro: alert & orientedx3, cranial nerves grossly intact. moves all 4 extremities w/o difficulty. Affect pleasant  She remains very tenuous despite inotrope support. Will hold lasix. Continue milrinone. Plan RHC in am. I discussed procedure with her.   Glori Bickers, MD  6:31 PM

## 2019-12-25 NOTE — Progress Notes (Signed)
   Subjective:  Pt seen at the bedside this morning. Feeling tired but had a better night's sleep. No further episodes of upper extremity weakness or lightheadedness. Understanding of potential RHC tomorrow. No acute concerns at this time.   Objective:  Vital signs in last 24 hours: Vitals:   12/24/19 2200 12/24/19 2300 12/25/19 0058 12/25/19 0422  BP:   135/86 (!) 115/51  Pulse: (!) 51 98 65 92  Resp: 20 14 20 20   Temp:    97.6 F (36.4 C)  TempSrc:    Oral  SpO2: 95% 95% 100% 96%  Weight:   102 kg    Physical Exam Vitals and nursing note reviewed.  Constitutional:      General: She is not in acute distress.    Appearance: She is not ill-appearing.     Comments: Pt laying down flat in bed, appears tired, but otherwise in no acute distress  Pulmonary:     Effort: Pulmonary effort is normal. No tachypnea.     Breath sounds: Normal breath sounds.     Comments: On room air. Musculoskeletal:     Comments: +1 bilateral LE pitting edema  Skin:    General: Skin is warm and dry.  Neurological:     Mental Status: She is alert.     Intake/Output Summary (Last 24 hours) at 12/25/2019 1059 Last data filed at 12/25/2019 0500 Gross per 24 hour  Intake 1200.94 ml  Output 950 ml  Net 250.94 ml    Assessment/Plan:  Active Problems:   Acute exacerbation of CHF (congestive heart failure) (HCC)   Acute on chronic HFrEF (heart failure with reduced ejection fraction) ([place])  Amber Bautista is a 49 year old F with significant PMH of metastatic carcinoid tumor, stage IIB ductal triple negative L breast cancer, HFrEF, nonischemic cardiomyopathy, and obesity, who presents with shortness of breath and a general unwell feelingconsistent with acute HFrEF exacerbation.   Acute on chronic heart failure exacerbationfrom NICM Frequent PVCs LBBB Echo from 3/13 with worsening of LV function (EF 20%), severe tricuspid valve regurg and stenosis, moderate/severe pulmonic valve regurg with mild stenosis,  and evidence of RV volume overload. Consistent with progression of carcinoid valve disease. - poor response to diuresis yesterday after 2.5mg  metolazone added yesterday evening - wt unchanged, urine output 950cc - CVP elevated at 18 today - worsening renal function with Cr 3.03 << 2.59 << 2.61, and GFR now 17 - off metoprolol/entresto/spiro -replete Mg and K to keep Mg > 2 and K > 4  Advanced heart failure consulted, appreciate their input in this case - co-ox 60% on milrinone 0.139mcg - hold diuresis today - severe TR confounding an accurate assessment of volume status - plan for RHC tomorrow - continue amiodarone 400mg  BID - consider CRT with LV lead only oupatient - if worsen renal function could consider dobutamine, though would be more arrhythmogenic  EP consulted, will follow recommendations   Carcinoid tumor metastatic to the liver - monthly somatuline IM injections for the carcinoid tumor. - nothing to do acutely - continue outpatient follow-up with Dr. Marin Olp Stage IIB ductal triple negative L breast cancer - Infiltrating ductal carcinoma of L breast diagnosed and treated 17yr ago with chemo  Prior to Admission Living Arrangement: home Anticipated Discharge Location: home Barriers to Discharge: clinical course Dispo: Anticipated discharge pending clinical improvement  Ladona Horns, MD 12/25/2019, 7:08 AM Pager: [phone]

## 2019-12-25 NOTE — Progress Notes (Signed)
Labs sent

## 2019-12-25 NOTE — Progress Notes (Signed)
Late Entry for 03.18.21 @ 2055: Patient seen and assessed. Patient found resting in bed with eyes closed. No acute distress noted. Physical assessment completed via computerized charting per [facility]. PICC (dual lumen) line noted to right upper arm. Purple lumen flushed with 10 ml Normal Saline with positive blood return noted. Red lumen with Milrone drip infusing at 3.9 ml/hr via infusion pump. Side rails up x 2. Bed lowest position and locked.

## 2019-12-25 NOTE — Plan of Care (Signed)
  Problem: Clinical Measurements: Goal: Respiratory complications will improve Outcome: Progressing   Problem: Activity: Goal: Capacity to carry out activities will improve Outcome: Progressing   Problem: Safety: Goal: Ability to remain free from injury will improve Outcome: Completed/Met

## 2019-12-26 ENCOUNTER — Encounter (HOSPITAL_COMMUNITY)
Admission: EM | Disposition: A | Discharge status: Hospice | Payer: Self-pay | Source: Home / Self Care | Attending: Internal Medicine

## 2019-12-26 DIAGNOSIS — I5021 Acute systolic (congestive) heart failure: Secondary | ICD-10-CM

## 2019-12-26 DIAGNOSIS — I428 Other cardiomyopathies: Secondary | ICD-10-CM

## 2019-12-26 DIAGNOSIS — N179 Acute kidney failure, unspecified: Secondary | ICD-10-CM

## 2019-12-26 DIAGNOSIS — N183 Chronic kidney disease, stage 3 unspecified: Secondary | ICD-10-CM

## 2019-12-26 HISTORY — PX: RIGHT HEART CATH: CATH118263

## 2019-12-26 LAB — CBC
HCT: 40.6 % (ref 36.0–46.0)
Hemoglobin: 12.8 g/dL (ref 12.0–15.0)
MCH: 27.9 pg (ref 26.0–34.0)
MCHC: 31.5 g/dL (ref 30.0–36.0)
MCV: 88.6 fL (ref 80.0–100.0)
Platelets: 189 10*3/uL (ref 150–400)
RBC: 4.58 MIL/uL (ref 3.87–5.11)
RDW: 18.8 % — ABNORMAL HIGH (ref 11.5–15.5)
WBC: 5.9 10*3/uL (ref 4.0–10.5)
nRBC: 1 % — ABNORMAL HIGH (ref 0.0–0.2)

## 2019-12-26 LAB — POCT I-STAT EG7
Acid-Base Excess: 6 mmol/L — ABNORMAL HIGH (ref 0.0–2.0)
Acid-Base Excess: 8 mmol/L — ABNORMAL HIGH (ref 0.0–2.0)
Bicarbonate: 32.3 mmol/L — ABNORMAL HIGH (ref 20.0–28.0)
Bicarbonate: 34.1 mmol/L — ABNORMAL HIGH (ref 20.0–28.0)
Calcium, Ion: 1.07 mmol/L — ABNORMAL LOW (ref 1.15–1.40)
Calcium, Ion: 1.18 mmol/L (ref 1.15–1.40)
HCT: 41 % (ref 36.0–46.0)
HCT: 42 % (ref 36.0–46.0)
Hemoglobin: 13.9 g/dL (ref 12.0–15.0)
Hemoglobin: 14.3 g/dL (ref 12.0–15.0)
O2 Saturation: 63 %
O2 Saturation: 64 %
Potassium: 2.6 mmol/L — CL (ref 3.5–5.1)
Potassium: 2.8 mmol/L — ABNORMAL LOW (ref 3.5–5.1)
Sodium: 138 mmol/L (ref 135–145)
Sodium: 140 mmol/L (ref 135–145)
TCO2: 34 mmol/L — ABNORMAL HIGH (ref 22–32)
TCO2: 36 mmol/L — ABNORMAL HIGH (ref 22–32)
pCO2, Ven: 50.2 mmHg (ref 44.0–60.0)
pCO2, Ven: 52.7 mmHg (ref 44.0–60.0)
pH, Ven: 7.416 (ref 7.250–7.430)
pH, Ven: 7.419 (ref 7.250–7.430)
pO2, Ven: 33 mmHg (ref 32.0–45.0)
pO2, Ven: 34 mmHg (ref 32.0–45.0)

## 2019-12-26 LAB — CREATININE, SERUM
Creatinine, Ser: 3.05 mg/dL — ABNORMAL HIGH (ref 0.44–1.00)
GFR calc Af Amer: 20 mL/min — ABNORMAL LOW (ref >60)
GFR calc non Af Amer: 17 mL/min — ABNORMAL LOW (ref >60)

## 2019-12-26 LAB — COOXEMETRY PANEL
Carboxyhemoglobin: 1.3 % (ref 0.5–1.5)
Carboxyhemoglobin: 1.4 % (ref 0.5–1.5)
Methemoglobin: 1.1 % (ref 0.0–1.5)
Methemoglobin: 0.9 % (ref 0.0–1.5)
O2 Saturation: 68.5 %
O2 Saturation: 71.8 %
Total hemoglobin: 13 g/dL (ref 12.0–16.0)
Total hemoglobin: 12.3 g/dL (ref 12.0–16.0)

## 2019-12-26 LAB — BASIC METABOLIC PANEL
Anion gap: 15 (ref 5–15)
BUN: 38 mg/dL — ABNORMAL HIGH (ref 6–20)
CO2: 29 mmol/L (ref 22–32)
Calcium: 9.2 mg/dL (ref 8.9–10.3)
Chloride: 94 mmol/L — ABNORMAL LOW (ref 98–111)
Creatinine, Ser: 3 mg/dL — ABNORMAL HIGH (ref 0.44–1.00)
GFR calc Af Amer: 20 mL/min — ABNORMAL LOW (ref >60)
GFR calc non Af Amer: 18 mL/min — ABNORMAL LOW (ref >60)
Glucose, Bld: 117 mg/dL — ABNORMAL HIGH (ref 70–99)
Potassium: 3 mmol/L — ABNORMAL LOW (ref 3.5–5.1)
Sodium: 138 mmol/L (ref 135–145)

## 2019-12-26 LAB — MAGNESIUM: Magnesium: 1.8 mg/dL (ref 1.7–2.4)

## 2019-12-26 SURGERY — RIGHT HEART CATH
Anesthesia: LOCAL

## 2019-12-26 MED ORDER — POTASSIUM CHLORIDE CRYS ER 20 MEQ PO TBCR
20.0000 meq | EXTENDED_RELEASE_TABLET | Freq: Once | ORAL | Status: AC
  Administered 2019-12-26: 20 meq via ORAL
  Filled 2019-12-26: qty 1

## 2019-12-26 MED ORDER — SODIUM CHLORIDE 0.9 % IV SOLN: 250.0000 mL | INTRAVENOUS | Status: DC | PRN

## 2019-12-26 MED ORDER — HEPARIN (PORCINE) IN NACL 1000-0.9 UT/500ML-% IV SOLN
INTRAVENOUS | Status: AC
  Filled 2019-12-26: qty 500

## 2019-12-26 MED ORDER — ACETAMINOPHEN 325 MG PO TABS: 650.0000 mg | ORAL_TABLET | ORAL | Status: DC | PRN

## 2019-12-26 MED ORDER — SODIUM CHLORIDE 0.9% FLUSH: 3.0000 mL | INTRAVENOUS | Status: DC | PRN

## 2019-12-26 MED ORDER — ASPIRIN 81 MG PO CHEW: 81.0000 mg | CHEWABLE_TABLET | ORAL | Status: DC

## 2019-12-26 MED ORDER — FENTANYL CITRATE (PF) 100 MCG/2ML IJ SOLN
INTRAMUSCULAR | Status: DC | PRN
  Administered 2019-12-26 (×2): 25 ug via INTRAVENOUS

## 2019-12-26 MED ORDER — LIDOCAINE HCL (PF) 1 % IJ SOLN
INTRAMUSCULAR | Status: DC | PRN
  Administered 2019-12-26: 10 mL
  Administered 2019-12-26: 20 mL

## 2019-12-26 MED ORDER — ONDANSETRON HCL 4 MG/2ML IJ SOLN
4.0000 mg | Freq: Four times a day (QID) | INTRAMUSCULAR | Status: DC | PRN
  Administered 2020-01-02: 4 mg via INTRAVENOUS
  Filled 2019-12-26: qty 2

## 2019-12-26 MED ORDER — HYDRALAZINE HCL 20 MG/ML IJ SOLN: 10.0000 mg | INTRAMUSCULAR | Status: AC | PRN

## 2019-12-26 MED ORDER — MAGNESIUM SULFATE 2 GM/50ML IV SOLN
2.0000 g | Freq: Once | INTRAVENOUS | Status: AC
  Administered 2019-12-26: 2 g via INTRAVENOUS
  Filled 2019-12-26: qty 50

## 2019-12-26 MED ORDER — ASPIRIN 81 MG PO CHEW
81.0000 mg | CHEWABLE_TABLET | ORAL | Status: AC
  Administered 2019-12-26: 81 mg via ORAL
  Filled 2019-12-26: qty 1

## 2019-12-26 MED ORDER — SODIUM CHLORIDE 0.9 % IV SOLN
INTRAVENOUS | Status: AC | PRN
  Administered 2019-12-26: 10 mL/h via INTRAVENOUS

## 2019-12-26 MED ORDER — SODIUM CHLORIDE 0.9% FLUSH
3.0000 mL | Freq: Two times a day (BID) | INTRAVENOUS | Status: DC
  Administered 2019-12-27 – 2019-12-31 (×5): 3 mL via INTRAVENOUS

## 2019-12-26 MED ORDER — POTASSIUM CHLORIDE CRYS ER 10 MEQ PO TBCR
EXTENDED_RELEASE_TABLET | ORAL | Status: DC | PRN
  Administered 2019-12-26: 40 meq via ORAL

## 2019-12-26 MED ORDER — MIDAZOLAM HCL 2 MG/2ML IJ SOLN
INTRAMUSCULAR | Status: AC
  Filled 2019-12-26: qty 2

## 2019-12-26 MED ORDER — SODIUM CHLORIDE 0.9 % IV SOLN: INTRAVENOUS | Status: DC

## 2019-12-26 MED ORDER — POTASSIUM CHLORIDE CRYS ER 20 MEQ PO TBCR
40.0000 meq | EXTENDED_RELEASE_TABLET | Freq: Two times a day (BID) | ORAL | Status: DC
  Administered 2019-12-26 – 2019-12-30 (×10): 40 meq via ORAL
  Filled 2019-12-26 (×6): qty 2
  Filled 2019-12-26: qty 4
  Filled 2019-12-26 (×3): qty 2

## 2019-12-26 MED ORDER — FENTANYL CITRATE (PF) 100 MCG/2ML IJ SOLN
INTRAMUSCULAR | Status: AC
  Filled 2019-12-26: qty 2

## 2019-12-26 MED ORDER — POTASSIUM CHLORIDE CRYS ER 20 MEQ PO TBCR
EXTENDED_RELEASE_TABLET | ORAL | Status: AC
  Filled 2019-12-26: qty 2

## 2019-12-26 MED ORDER — LIDOCAINE HCL (PF) 1 % IJ SOLN
INTRAMUSCULAR | Status: AC
  Filled 2019-12-26: qty 30

## 2019-12-26 MED ORDER — MIDAZOLAM HCL 2 MG/2ML IJ SOLN
INTRAMUSCULAR | Status: DC | PRN
  Administered 2019-12-26: 2 mg via INTRAVENOUS

## 2019-12-26 MED ORDER — ENOXAPARIN SODIUM 40 MG/0.4ML ~~LOC~~ SOLN: 40.0000 mg | SUBCUTANEOUS | Status: DC

## 2019-12-26 MED ORDER — HEPARIN (PORCINE) IN NACL 1000-0.9 UT/500ML-% IV SOLN
INTRAVENOUS | Status: DC | PRN
  Administered 2019-12-26 (×2): 500 mL

## 2019-12-26 MED ORDER — LABETALOL HCL 5 MG/ML IV SOLN: 10.0000 mg | INTRAVENOUS | Status: AC | PRN

## 2019-12-26 SURGICAL SUPPLY — 9 items
CATH SWAN GANZ 7F STRAIGHT (CATHETERS) ×1 IMPLANT
KIT MICROPUNCTURE NIT STIFF (SHEATH) ×1 IMPLANT
PACK CARDIAC CATHETERIZATION (CUSTOM PROCEDURE TRAY) ×2 IMPLANT
SHEATH PINNACLE 7F 10CM (SHEATH) ×1 IMPLANT
SHEATH PROBE COVER 6X72 (BAG) ×1 IMPLANT
SLEEVE REPOSITIONING LENGTH 30 (MISCELLANEOUS) ×1 IMPLANT
TRANSDUCER W/STOPCOCK (MISCELLANEOUS) ×2 IMPLANT
WIRE .035 3MM-J 145CM (WIRE) ×1 IMPLANT
WIRE EMERALD 3MM-J .025X260CM (WIRE) ×1 IMPLANT

## 2019-12-26 NOTE — Progress Notes (Signed)
Physical Therapy Treatment Patient Details Name: Amber Bautista MRN: [id number] DOB: 05-21-1971 Today's Date: 12/26/2019    History of Present Illness Pt is a 49 yo female presenting with SOB, dx with CHF exacerbation upon arrival to ED. PMH includes: HFrEF, nonischemic cardiomyopathy, obesity, hypertension, LBBB, frequent PVCs followed by EP, stage IIB ductal left breast carcinoma, and metastatic liver carcinoid.    PT Comments    Patient seen for mobility progression. Pt tolerated increased gait distance this session. Pt did c/o dizziness and seated rest break needed before return to room. BP 120s/90s. PT will continue to follow acutely.    Follow Up Recommendations  No PT follow up;Supervision/Assistance - 24 hour     Equipment Recommendations  None recommended by PT    Recommendations for Other Services       Precautions / Restrictions Precautions Precautions: Fall Restrictions Weight Bearing Restrictions: No    Mobility  Bed Mobility Overal bed mobility: Independent                Transfers Overall transfer level: Modified independent Equipment used: None                Ambulation/Gait Ambulation/Gait assistance: Supervision Gait Distance (Feet): (120 ft with seated break due to c/o dizziness) Assistive device: None Gait Pattern/deviations: Step-through pattern;Decreased stride length Gait velocity: decreased   General Gait Details: pt with decreased cadence and guarded movements; c/o dizziness adn seated break taken; BP WNL    Stairs             Wheelchair Mobility    Modified Rankin (Stroke Patients Only)       Balance Overall balance assessment: Needs assistance Sitting-balance support: No upper extremity supported;Feet supported Sitting balance-Leahy Scale: Good       Standing balance-Leahy Scale: Fair                              Cognition Arousal/Alertness: Awake/alert Behavior During Therapy: Flat  affect;WFL for tasks assessed/performed Overall Cognitive Status: Within Functional Limits for tasks assessed                                        Exercises      General Comments        Pertinent Vitals/Pain Pain Assessment: No/denies pain    Home Living                      Prior Function            PT Goals (current goals can now be found in the care plan section) Progress towards PT goals: Progressing toward goals    Frequency    Min 3X/week      PT Plan Current plan remains appropriate    Co-evaluation              AM-PAC PT "6 Clicks" Mobility   Outcome Measure  Help needed turning from your back to your side while in a flat bed without using bedrails?: None Help needed moving from lying on your back to sitting on the side of a flat bed without using bedrails?: None Help needed moving to and from a bed to a chair (including a wheelchair)?: A Little Help needed standing up from a chair using your arms (e.g., wheelchair or bedside chair)?: A Little Help needed  to walk in hospital room?: A Little Help needed climbing 3-5 steps with a railing? : A Little 6 Click Score: 20    End of Session Equipment Utilized During Treatment: Gait belt Activity Tolerance: Patient tolerated treatment well Patient left: in chair Nurse Communication: Mobility status PT Visit Diagnosis: Unsteadiness on feet (R26.81);Difficulty in walking, not elsewhere classified (R26.2)     Time: 6301-6010 PT Time Calculation (min) (ACUTE ONLY): 21 min  Charges:  $Gait Training: 8-22 mins                     Earney Navy, PTA Acute Rehabilitation Services Pager: [phone] Office: [phone]     Darliss Cheney 12/26/2019, 5:21 PM

## 2019-12-26 NOTE — Interval H&P Note (Signed)
History and Physical Interval Note:  12/26/2019 7:56 AM  [name]  has presented today for surgery, with the diagnosis of heart failure.  The various methods of treatment have been discussed with the patient and family. After consideration of risks, benefits and other options for treatment, the patient has consented to  Procedure(s): RIGHT HEART CATH (N/A) as a surgical intervention.  The patient's history has been reviewed, patient examined, no change in status, stable for surgery.  I have reviewed the patient's chart and labs.  Questions were answered to the patient's satisfaction.     Amber Bautista

## 2019-12-26 NOTE — Progress Notes (Signed)
Patient returned to unit form cathlab.

## 2019-12-26 NOTE — Progress Notes (Signed)
Patient transported via bed to cardiac cath lab.

## 2019-12-26 NOTE — Progress Notes (Signed)
Advanced Heart Failure Rounding Note  PCP-Cardiologist: Shirlee More, MD   Subjective:    Co-ox 69% on milrinone 0.125.    Off lasix due to AKI. Creatinine unchanged at 3.0   Weight down 2 pounds.    Mild dyspnea. Remains on amio for PVC suppression but continues to go in out bigeminy  For RHC today   Objective:   Weight Range: 100.8 kg Body mass index is 35.86 kg/m.   Vital Signs:   Temp:  [97.5 F (36.4 C)-98.4 F (36.9 C)] 98.1 F (36.7 C) (03/19 0419) Pulse Rate:  [65-101] 65 (03/19 0419) Resp:  [15-20] 18 (03/19 0419) BP: (102-126)/(61-83) 117/78 (03/19 0419) SpO2:  [96 %-100 %] 98 % (03/19 0740) Weight:  [100.8 kg] 100.8 kg (03/19 0419) Last BM Date: 12/24/19  Weight change: Filed Weights   12/24/19 0408 12/25/19 0058 12/26/19 0419  Weight: 101.7 kg 102 kg 100.8 kg    Intake/Output:   Intake/Output Summary (Last 24 hours) at 12/26/2019 0749 Last data filed at 12/26/2019 0419 Gross per 24 hour  Intake 530.76 ml  Output 1350 ml  Net -819.24 ml      Physical Exam   General:  Obese  female. No respiratory difficulty HEENT: normal Neck: supple. JVP to jaw Carotids 2+ bilat; no bruits. No lymphadenopathy or thryomegaly appreciated. Cor: PMI nondisplaced. Regular rate & rhythm. 3/6 TR Lungs: clear Abdomen: obese soft, nontender, mildly distended. No hepatosplenomegaly. No bruits or masses. Good bowel sounds. Extremities: no cyanosis, clubbing, rash, 2+ edema Neuro: alert & orientedx3, cranial nerves grossly intact. moves all 4 extremities w/o difficulty. Affect pleasant    Telemetry   Sinus 90s with intermittent/frequent bigeminy. Personally reviewed   EKG    N/a   Labs    CBC No results for input(s): WBC, NEUTROABS, HGB, HCT, MCV, PLT in the last 72 hours. Basic Metabolic Panel Recent Labs    12/25/19 0500 12/26/19 0500  NA 140 138  K 3.3* 3.0*  CL 96* 94*  CO2 29 29  GLUCOSE 113* 117*  BUN 38* 38*  CREATININE 3.03* 3.00*    CALCIUM 9.3 9.2  MG 2.2 1.8   Liver Function Tests No results for input(s): AST, ALT, ALKPHOS, BILITOT, PROT, ALBUMIN in the last 72 hours. No results for input(s): LIPASE, AMYLASE in the last 72 hours. Cardiac Enzymes No results for input(s): CKTOTAL, CKMB, CKMBINDEX, TROPONINI in the last 72 hours.  BNP: BNP (last 3 results) Recent Labs    07/21/19 1334 10/13/19 1300 12/20/19 0700  BNP 69.8 301.6* 382.1*    ProBNP (last 3 results) Recent Labs    04/09/19 1458 05/20/19 1611 12/17/19 1532  PROBNP 469* 1,071* 5,909*     D-Dimer No results for input(s): DDIMER in the last 72 hours. Hemoglobin A1C No results for input(s): HGBA1C in the last 72 hours. Fasting Lipid Panel No results for input(s): CHOL, HDL, LDLCALC, TRIG, CHOLHDL, LDLDIRECT in the last 72 hours. Thyroid Function Tests No results for input(s): TSH, T4TOTAL, T3FREE, THYROIDAB in the last 72 hours.  Invalid input(s): FREET3  Other results:   Imaging    No results found.   Medications:     Scheduled Medications: . [MAR Hold] amiodarone  400 mg Oral BID  . [MAR Hold] Chlorhexidine Gluconate Cloth  6 each Topical Daily  . [MAR Hold] enoxaparin (LOVENOX) injection  30 mg Subcutaneous Q24H  . pneumococcal 23 valent vaccine  0.5 mL Intramuscular Tomorrow-1000  . [MAR Hold] potassium chloride  40 mEq  Oral BID  . [MAR Hold] ramelteon  8 mg Oral QHS  . [MAR Hold] sodium chloride flush  10-40 mL Intracatheter Q12H  . [MAR Hold] sodium chloride flush  3 mL Intravenous Q12H    Infusions: . sodium chloride    . sodium chloride 10 mL/hr at 12/26/19 0525  . magnesium sulfate bolus IVPB 2 g (12/26/19 0706)  . milrinone 0.125 mcg/kg/min (12/25/19 0827)    PRN Medications: sodium chloride, [MAR Hold] acetaminophen **OR** [MAR Hold] acetaminophen, Heparin (Porcine) in NaCl, [MAR Hold] sodium chloride flush, sodium chloride flush     Assessment/Plan   1. Acute systolic HF due to NICM  - Echo 2013  EF 25-30%. Cath normal cors - Echo 2020 EF 45-50% - Echo 12/19/19: EF 20% septal DK. RV ok TV restricted severe TR  - suspect LV dysfunction due to PVCs +/- LBBB. PVCs/bigeminy come in bursts. Unclear if overall burden high enough to cause CM but Suspect so  - CO-OX improved 69% % on milrinone 0.125 mcg. - Had poor diuresis despite IV Lasix w/ rising SCr, now >3. BP soft. CVP still elevated, however accurate assessment of volume status confounded by severe TR. She is not highly symptomatic.  - Now off lasix. For RHC today  -  Hold b-blocker - Off Entresto/spiro due to AKI - Although prognosis seems quite poor with several major health issues including metastatic carcinoid syndrome, I think the main issue now is likely low output HF in setting of profound cardiomyopathy either due to PVCs +/- LBBB. Will attempt to suppress PVCs with amio and support with short-term inotropes as needed to help improve symptoms and facilitate volume removal and renal recovery.    2. Frequent PVCs. - Zio Patch performed 04/07/2019 showed frequent PVCs with a 7.4% burden  - On tele, continues to have frequent PVCs ~25/hr which come in bursts - EP consulted. Consider LV lead after discharge by Dr Caryl Comes  - Initially on IV amio and now switched to 400 mg twice a day. Continue  May need to add mexilitene. I will d/w Dr. Caryl Comes today- keep K> 4.0, Mg > 2.0  3. Acute on CKD3 - creatinine 1.3 in 11/20 -> 1.7 1/21 -> - Creatinine  2.7>2.6  >2.6>>3.03 > 3.0 - suspect due to low output/cardiorenal - continue milrinone.  - hold Lasix  - RHC today - Follow BMP   4. 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 Metastatic carcinoid syndrome with liver mets - followed by Dr. Marin Olp - on somatuline  6. LBBB/IVCD - ideally might benefit from CRT if LV does not respond to PVC suppression - EP to evaluate   7. Hypokalemia/Hypomagnesemia - supp to keep K> 4.0, Mg > 2.0 - K 3.3 today. Supp KCl ordered. Will  reduce to 40 meq x 1, given worsening SCr and lasix hold  -follow BMP   Length of Stay: [place], MD  12/26/2019, 7:49 AM  Advanced Heart Failure Team Pager [phone] (M-F; 7a - 4p)  Please contact [facility] for night-coverage after hours (4p -7a ) and weekends on [email or web address]

## 2019-12-26 NOTE — H&P (View-Only) (Signed)
Advanced Heart Failure Rounding Note  PCP-Cardiologist: Shirlee More, MD   Subjective:    Co-ox 69% on milrinone 0.125.    Off lasix due to AKI. Creatinine unchanged at 3.0   Weight down 2 pounds.    Mild dyspnea. Remains on amio for PVC suppression but continues to go in out bigeminy  For RHC today   Objective:   Weight Range: 100.8 kg Body mass index is 35.86 kg/m.   Vital Signs:   Temp:  [97.5 F (36.4 C)-98.4 F (36.9 C)] 98.1 F (36.7 C) (03/19 0419) Pulse Rate:  [65-101] 65 (03/19 0419) Resp:  [15-20] 18 (03/19 0419) BP: (102-126)/(61-83) 117/78 (03/19 0419) SpO2:  [96 %-100 %] 98 % (03/19 0740) Weight:  [100.8 kg] 100.8 kg (03/19 0419) Last BM Date: 12/24/19  Weight change: Filed Weights   12/24/19 0408 12/25/19 0058 12/26/19 0419  Weight: 101.7 kg 102 kg 100.8 kg    Intake/Output:   Intake/Output Summary (Last 24 hours) at 12/26/2019 0749 Last data filed at 12/26/2019 0419 Gross per 24 hour  Intake 530.76 ml  Output 1350 ml  Net -819.24 ml      Physical Exam   General:  Obese  female. No respiratory difficulty HEENT: normal Neck: supple. JVP to jaw Carotids 2+ bilat; no bruits. No lymphadenopathy or thryomegaly appreciated. Cor: PMI nondisplaced. Regular rate & rhythm. 3/6 TR Lungs: clear Abdomen: obese soft, nontender, mildly distended. No hepatosplenomegaly. No bruits or masses. Good bowel sounds. Extremities: no cyanosis, clubbing, rash, 2+ edema Neuro: alert & orientedx3, cranial nerves grossly intact. moves all 4 extremities w/o difficulty. Affect pleasant    Telemetry   Sinus 90s with intermittent/frequent bigeminy. Personally reviewed   EKG    N/a   Labs    CBC No results for input(s): WBC, NEUTROABS, HGB, HCT, MCV, PLT in the last 72 hours. Basic Metabolic Panel Recent Labs    12/25/19 0500 12/26/19 0500  NA 140 138  K 3.3* 3.0*  CL 96* 94*  CO2 29 29  GLUCOSE 113* 117*  BUN 38* 38*  CREATININE 3.03* 3.00*    CALCIUM 9.3 9.2  MG 2.2 1.8   Liver Function Tests No results for input(s): AST, ALT, ALKPHOS, BILITOT, PROT, ALBUMIN in the last 72 hours. No results for input(s): LIPASE, AMYLASE in the last 72 hours. Cardiac Enzymes No results for input(s): CKTOTAL, CKMB, CKMBINDEX, TROPONINI in the last 72 hours.  BNP: BNP (last 3 results) Recent Labs    07/21/19 1334 10/13/19 1300 12/20/19 0700  BNP 69.8 301.6* 382.1*    ProBNP (last 3 results) Recent Labs    04/09/19 1458 05/20/19 1611 12/17/19 1532  PROBNP 469* 1,071* 5,909*     D-Dimer No results for input(s): DDIMER in the last 72 hours. Hemoglobin A1C No results for input(s): HGBA1C in the last 72 hours. Fasting Lipid Panel No results for input(s): CHOL, HDL, LDLCALC, TRIG, CHOLHDL, LDLDIRECT in the last 72 hours. Thyroid Function Tests No results for input(s): TSH, T4TOTAL, T3FREE, THYROIDAB in the last 72 hours.  Invalid input(s): FREET3  Other results:   Imaging    No results found.   Medications:     Scheduled Medications: . [MAR Hold] amiodarone  400 mg Oral BID  . [MAR Hold] Chlorhexidine Gluconate Cloth  6 each Topical Daily  . [MAR Hold] enoxaparin (LOVENOX) injection  30 mg Subcutaneous Q24H  . pneumococcal 23 valent vaccine  0.5 mL Intramuscular Tomorrow-1000  . [MAR Hold] potassium chloride  40 mEq  Oral BID  . [MAR Hold] ramelteon  8 mg Oral QHS  . [MAR Hold] sodium chloride flush  10-40 mL Intracatheter Q12H  . [MAR Hold] sodium chloride flush  3 mL Intravenous Q12H    Infusions: . sodium chloride    . sodium chloride 10 mL/hr at 12/26/19 0525  . magnesium sulfate bolus IVPB 2 g (12/26/19 0706)  . milrinone 0.125 mcg/kg/min (12/25/19 0827)    PRN Medications: sodium chloride, [MAR Hold] acetaminophen **OR** [MAR Hold] acetaminophen, Heparin (Porcine) in NaCl, [MAR Hold] sodium chloride flush, sodium chloride flush     Assessment/Plan   1. Acute systolic HF due to NICM  - Echo 2013  EF 25-30%. Cath normal cors - Echo 2020 EF 45-50% - Echo 12/19/19: EF 20% septal DK. RV ok TV restricted severe TR  - suspect LV dysfunction due to PVCs +/- LBBB. PVCs/bigeminy come in bursts. Unclear if overall burden high enough to cause CM but Suspect so  - CO-OX improved 69% % on milrinone 0.125 mcg. - Had poor diuresis despite IV Lasix w/ rising SCr, now >3. BP soft. CVP still elevated, however accurate assessment of volume status confounded by severe TR. She is not highly symptomatic.  - Now off lasix. For RHC today  -  Hold b-blocker - Off Entresto/spiro due to AKI - Although prognosis seems quite poor with several major health issues including metastatic carcinoid syndrome, I think the main issue now is likely low output HF in setting of profound cardiomyopathy either due to PVCs +/- LBBB. Will attempt to suppress PVCs with amio and support with short-term inotropes as needed to help improve symptoms and facilitate volume removal and renal recovery.    2. Frequent PVCs. - Zio Patch performed 04/07/2019 showed frequent PVCs with a 7.4% burden  - On tele, continues to have frequent PVCs ~25/hr which come in bursts - EP consulted. Consider LV lead after discharge by Dr Caryl Comes  - Initially on IV amio and now switched to 400 mg twice a day. Continue  May need to add mexilitene. I will d/w Dr. Caryl Comes today- keep K> 4.0, Mg > 2.0  3. Acute on CKD3 - creatinine 1.3 in 11/20 -> 1.7 1/21 -> - Creatinine  2.7>2.6  >2.6>>3.03 > 3.0 - suspect due to low output/cardiorenal - continue milrinone.  - hold Lasix  - RHC today - Follow BMP   4. 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 Metastatic carcinoid syndrome with liver mets - followed by Dr. Marin Olp - on somatuline  6. LBBB/IVCD - ideally might benefit from CRT if LV does not respond to PVC suppression - EP to evaluate   7. Hypokalemia/Hypomagnesemia - supp to keep K> 4.0, Mg > 2.0 - K 3.3 today. Supp KCl ordered. Will  reduce to 40 meq x 1, given worsening SCr and lasix hold  -follow BMP   Length of Stay: [place], MD  12/26/2019, 7:49 AM  Advanced Heart Failure Team Pager [phone] (M-F; 7a - 4p)  Please contact [facility] for night-coverage after hours (4p -7a ) and weekends on [email or web address]

## 2019-12-26 NOTE — Progress Notes (Signed)
   Subjective: Pt seen at the bedside this morning after RHC. States she is having some back pain and discomfort after the procedure. Otherwise, no acute concerns. Asking about plan for pacemaker. Stated that would likely take place outpatient and cardiology is continuing to assess.   Objective:  Vital signs in last 24 hours: Vitals:   12/25/19 1606 12/25/19 1949 12/25/19 2329 12/26/19 0419  BP: 102/75 126/61 103/73 117/78  Pulse: 89 (!) 101 91 65  Resp: 15 19 20 18   Temp: 97.8 F (36.6 C) 98.2 F (36.8 C) (!) 97.5 F (36.4 C) 98.1 F (36.7 C)  TempSrc: Oral Oral Oral Oral  SpO2: 97% 96% 99% 96%  Weight:    100.8 kg   Physical Exam Vitals and nursing note reviewed.  Constitutional:      General: She is not in acute distress.    Appearance: She is not ill-appearing.  Pulmonary:     Effort: Pulmonary effort is normal.  Musculoskeletal:     Comments: +1 pitting bilateral LE edema  Skin:    General: Skin is warm and dry.  Neurological:     Mental Status: She is alert.    Assessment/Plan:  Active Problems:   Acute exacerbation of CHF (congestive heart failure) (HCC)   Acute on chronic HFrEF (heart failure with reduced ejection fraction) ([facility])   Amber Bautista is a 49 year old F with significant PMH of metastatic carcinoid tumor, stage IIB ductal triple negative L breast cancer, HFrEF, nonischemic cardiomyopathy, and obesity, who presents with shortness of breath and a general unwell feelingconsistent with acute HFrEF exacerbation.    Acute on chronic heart failure exacerbationfrom NICM Frequent PVCs LBBB Echofrom 3/13with worsening of LV function (EF 20%), severe tricuspid valve regurg and stenosis, moderate/severe pulmonic valve regurg with mild stenosis, and evidence of RV volume overload. Consistent with progression of carcinoid valve disease. - diuresis held yesterday - RHC this morning - wt down 1.2kg, net negative 800cc with 1.3L urine output - CVP 16 this AM -  renal function unchanged, Cr 3.0 with GFR of 18 - off metoprolol/entresto/spiro -replete Mg and K to keep Mg > 2 and K > 4  RHC assessment - severe TR with elevated RA pressures and prominent CV waves - pulmonic stenosis - normal CO by fick - low left-sided filing pressures  Advanced heart failure consulted, appreciate their input in this case - co-ox 69% on milrinone 0.170mcg - issue is wide open TR, not L-sided HF - continue to hold diuresis - consider TEE to evaluate right sided valves - continue amiodarone 400mg  BID - may need to add mexilitene - consider CRT with LV lead only oupatient  EP consulted.   Carcinoid tumor metastatic to the liver -monthly somatuline IM injections for the carcinoid tumor. - nothing to do acutely - continue outpatient follow-up with Dr. Marin Olp Stage IIB ductal triple negative L breast cancer -Infiltrating ductal carcinoma of L breast diagnosed and treated 41yrago with chemo  Prior to Admission Living Arrangement: home Anticipated Discharge Location: home Barriers to Discharge: clinical course Dispo: Anticipated discharge pending clinical course.  Ladona Horns, MD 12/26/2019, 6:28 AM Pager: [phone]

## 2019-12-26 NOTE — Progress Notes (Signed)
Patient off of unit to cath lab.

## 2019-12-27 ENCOUNTER — Other Ambulatory Visit: Payer: Self-pay

## 2019-12-27 DIAGNOSIS — Z853 Personal history of malignant neoplasm of breast: Secondary | ICD-10-CM

## 2019-12-27 DIAGNOSIS — I371 Nonrheumatic pulmonary valve insufficiency: Secondary | ICD-10-CM

## 2019-12-27 DIAGNOSIS — N179 Acute kidney failure, unspecified: Secondary | ICD-10-CM

## 2019-12-27 LAB — COOXEMETRY PANEL
Carboxyhemoglobin: 1.3 % (ref 0.5–1.5)
Methemoglobin: 1 % (ref 0.0–1.5)
O2 Saturation: 63.5 %
Total hemoglobin: 12.4 g/dL (ref 12.0–16.0)

## 2019-12-27 LAB — MAGNESIUM: Magnesium: 2.1 mg/dL (ref 1.7–2.4)

## 2019-12-27 LAB — BASIC METABOLIC PANEL
Anion gap: 13 (ref 5–15)
BUN: 41 mg/dL — ABNORMAL HIGH (ref 6–20)
CO2: 26 mmol/L (ref 22–32)
Calcium: 8.6 mg/dL — ABNORMAL LOW (ref 8.9–10.3)
Chloride: 98 mmol/L (ref 98–111)
Creatinine, Ser: 3.05 mg/dL — ABNORMAL HIGH (ref 0.44–1.00)
GFR calc Af Amer: 20 mL/min — ABNORMAL LOW (ref >60)
GFR calc non Af Amer: 17 mL/min — ABNORMAL LOW (ref >60)
Glucose, Bld: 114 mg/dL — ABNORMAL HIGH (ref 70–99)
Potassium: 3.2 mmol/L — ABNORMAL LOW (ref 3.5–5.1)
Sodium: 137 mmol/L (ref 135–145)

## 2019-12-27 MED ORDER — AMIODARONE HCL 200 MG PO TABS
200.0000 mg | ORAL_TABLET | Freq: Every day | ORAL | Status: DC
  Administered 2019-12-27 – 2020-01-02 (×7): 200 mg via ORAL
  Filled 2019-12-27 (×7): qty 1

## 2019-12-27 MED ORDER — DIPHENHYDRAMINE HCL 25 MG PO CAPS
25.0000 mg | ORAL_CAPSULE | Freq: Once | ORAL | Status: AC
  Administered 2019-12-27: 25 mg via ORAL
  Filled 2019-12-27: qty 1

## 2019-12-27 NOTE — Progress Notes (Signed)
Progress Note  Patient Name: Amber Bautista Date of Encounter: 12/27/2019  Primary Cardiologist: Shirlee More, MD   Subjective   "When do you think I will be able to go home."  Inpatient Medications    Scheduled Meds: . amiodarone  400 mg Oral BID  . Chlorhexidine Gluconate Cloth  6 each Topical Daily  . enoxaparin (LOVENOX) injection  30 mg Subcutaneous Q24H  . pneumococcal 23 valent vaccine  0.5 mL Intramuscular Tomorrow-1000  . potassium chloride  40 mEq Oral BID  . ramelteon  8 mg Oral QHS  . sodium chloride flush  10-40 mL Intracatheter Q12H  . sodium chloride flush  3 mL Intravenous Q12H  . sodium chloride flush  3 mL Intravenous Q12H   Continuous Infusions: . sodium chloride    . milrinone 0.125 mcg/kg/min (12/26/19 1016)   PRN Meds: sodium chloride, acetaminophen **OR** acetaminophen, acetaminophen, ondansetron (ZOFRAN) IV, sodium chloride flush, sodium chloride flush   Vital Signs    Vitals:   12/26/19 1658 12/26/19 1948 12/26/19 2100 12/27/19 0005  BP:  107/72  105/87  Pulse:  87  92  Resp:  14  17  Temp: 97.6 F (36.4 C) 97.6 F (36.4 C)  (!) 97.5 F (36.4 C)  TempSrc:  Oral  Oral  SpO2:   94% 96%  Weight:    102.3 kg    Intake/Output Summary (Last 24 hours) at 12/27/2019 0836 Last data filed at 12/27/2019 0800 Gross per 24 hour  Intake 1154.73 ml  Output 350 ml  Net 804.73 ml   Filed Weights   12/25/19 0058 12/26/19 0419 12/27/19 0005  Weight: 102 kg 100.8 kg 102.3 kg    Telemetry    Nsr, QT prolongation - Personally Reviewed  ECG    NSR with LBBB, and QT prolongation  - Personally Reviewed  Physical Exam   GEN: No acute distress.   Neck: 6 cm JVD Cardiac: RRR, with a prominent S3.  Respiratory: Clear to auscultation bilaterally. GI: Soft, nontender, non-distended  MS: No edema; No deformity. Neuro:  Nonfocal  Psych: Normal affect   Labs    Chemistry Recent Labs  Lab 12/20/19 0842 12/21/19 0523 12/22/19 0719  12/25/19 0500 12/25/19 0500 12/26/19 0500 12/26/19 0830 12/26/19 1155 12/27/19 0500  NA  --  139   < > 140   < > 138 138  140  --  137  K  --  3.0*   < > 3.3*   < > 3.0* 2.8*  2.6*  --  3.2*  CL  --  99   < > 96*  --  94*  --   --  98  CO2  --  28   < > 29  --  29  --   --  26  GLUCOSE  --  109*   < > 113*  --  117*  --   --  114*  BUN  --  31*   < > 38*  --  38*  --   --  41*  CREATININE  --  2.73*   < > 3.03*   < > 3.00*  --  3.05* 3.05*  CALCIUM  --  8.8*   < > 9.3  --  9.2  --   --  8.6*  PROT 6.9 6.4*  --   --   --   --   --   --   --   ALBUMIN 3.5 3.3*  --   --   --   --   --   --   --  AST 19 19  --   --   --   --   --   --   --   ALT 16 17  --   --   --   --   --   --   --   ALKPHOS 95 94  --   --   --   --   --   --   --   BILITOT 1.3* 1.3*  --   --   --   --   --   --   --   GFRNONAA  --  20*   < > 17*   < > 18*  --  17* 17*  GFRAA  --  23*   < > 20*   < > 20*  --  20* 20*  ANIONGAP  --  12   < > 15  --  15  --   --  13   < > = values in this interval not displayed.     Hematology Recent Labs  Lab 12/21/19 0523 12/21/19 0523 12/23/19 0325 12/26/19 0830 12/26/19 1155  WBC 5.5  --  5.6  --  5.9  RBC 4.84  --  4.48  --  4.58  HGB 13.4   < > 12.7 14.3  13.9 12.8  HCT 43.6   < > 40.4 42.0  41.0 40.6  MCV 90.1  --  90.2  --  88.6  MCH 27.7  --  28.3  --  27.9  MCHC 30.7  --  31.4  --  31.5  RDW 18.3*  --  18.3*  --  18.8*  PLT 205  --  198  --  189   < > = values in this interval not displayed.    Cardiac EnzymesNo results for input(s): TROPONINI in the last 168 hours. No results for input(s): TROPIPOC in the last 168 hours.   BNPNo results for input(s): BNP, PROBNP in the last 168 hours.   DDimer No results for input(s): DDIMER in the last 168 hours.   Radiology    CARDIAC CATHETERIZATION  Result Date: 12/26/2019 Findings: RA = 11 RV = 43/5 PA = 19/3 (10) PCW = 3 Fick cardiac output/index = 5.22/2.5 Thermo CO/CI = 3.1/1.5 (liekly inaccurate due to  severe TR) PVR = 1.3 WU Ao sat = 95% PA sat = 63%, 64% Assessment: 1. Severe TR with elevated RA pressures and prominent CV waves 2. Significant gradient across pulmonic valve suggestive of pulmonic stenosis 3. Normal cardiac output by fick 4. Low left-sided filling pressures Plan/Discussion: Main issue seems to clearly be wide open TR and not L-sided failure.  Hold diuretics for now. Continue milrinone. Consider TEE to more closely evaluate TV and PVs. Options limited. Glori Bickers, MD 10:06 AM    Cardiac Studies   2D echo reviewed  Patient Profile     49 y.o. female admitted with acute on chronic systolic heart failure and dense ventricular ectopy. 2D echo and right heart cath reviewed  Assessment & Plan    1. PVC's - her ectopy is improved. Continue amio, at lower dose. 2. Prolonged QT - her QT is quite prolonged on 400 bid of amio. I have reduced to 200 mg daily. 3. 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 Acute on chronic systolic heart failure - review of serial echos demonstrates progessively worsening  LV dysfunction.   Mattea Seger,M.D.     For questions or updates, please contact [place] Please consult [email or web address] for contact info under Cardiology/STEMI.      Signed, Cristopher Peru, MD  12/27/2019, 8:36 AM  Patient ID: Aurea Graff, female   DOB: [birth year], 49 y.o.   MRN: [id number]

## 2019-12-27 NOTE — Progress Notes (Signed)
   Subjective:   She is feeling ok this morning. Denies palpitations, shortness of breath or chest pain. PVC burden decreased compared to yesterday.   Objective:  Vital signs in last 24 hours: Vitals:   12/26/19 1658 12/26/19 1948 12/26/19 2100 12/27/19 0005  BP:  107/72  105/87  Pulse:  87  92  Resp:  14  17  Temp: 97.6 F (36.4 C) 97.6 F (36.4 C)  (!) 97.5 F (36.4 C)  TempSrc:  Oral  Oral  SpO2:   94% 96%  Weight:       Constitution: NAD, appears stated age  Cardio: RRR, +murmur, +1 LE edema Respiratory: CTA, no w/r/r Abdominal: NTTP, soft, non-distended MSK: moving all extremities Neuro: normal affect, a&ox3 Skin: c/d/i   RHC 12/26/19 RA = 11 RV = 43/5 PA = 19/3 (10) PCW = 3 Fick cardiac output/index = 5.22/2.5 Thermo CO/CI = 3.1/1.5 (liekly inaccurate due to severe TR) PVR = 1.3 WU Ao sat = 95% PA sat = 63%, 64%  Assessment: 1. Severe TR with elevated RA pressures and prominent CV waves 2. Significant gradient across pulmonic valve suggestive of pulmonic stenosis 3. Normal cardiac output by fick 4. Low left-sided filling pressures  Plan/Discussion:  Main issue seems to clearly be wide open TR and not L-sided failure.  Hold diuretics for now. Continue milrinone. Consider TEE to more closely evaluate TV and PVs. Options limited.   Assessment/Plan:  Active Problems:   Acute exacerbation of CHF (congestive heart failure) (HCC)   Acute on chronic HFrEF (heart failure with reduced ejection fraction) ([place])  49yo female with PMH metastatic carcinoid tumor, history of breast cancer, HFrEF, nonischemic cardiomyopathy, and obesity who presented with shortness of breath and increase LE edema, found to have reduced EF of 20% with progression of tricuspid and pulmonic valve regurge 2/2 carcinoid valve disease.   Acute on Chronic Heart Failure Exacerbation  Frequent PVCs LBBB RHC yesterday consistent with HF 2/2 to severe tricuspid regurge with normal cardiac  output.  - co-ox 63.5 today from 71 yesterday.  - diuresis held two days ago. Renal function unchanged but weight up almost 4 lbs compared to yesterday, decrease Uop with only net +937 yesterday and total net negative -4.9L - on amio 400 mg bid - PVC burden decreased today compared to yesterday. She does have prolonged qtC, amio decreased to 200 mg bid.  - Possible addition of mexilitene although PVC burden currently reduced  - EP consulted for outpatient CRT.   - Goal of K>4 and Mg > 2 - repleting today  - holding entresto/spiro/metop - continuing milrinone gtt  - possible TEE to evaluate right sided valves, will f/u HF   VTE: lovenox IVF: none Diet: renal/CM Code: full   Dispo: Anticipated discharge pending clinical improvement.    Marty Heck, DO 12/27/2019, 6:23 AM Pager: [phone]

## 2019-12-27 NOTE — Progress Notes (Signed)
On tele monitor showing QTC more than 600 and ST-V up to 3. EKG done to verify. (See EKG on chart.) QTC 592. Paged [facility] Dr Launa Grill about it.  Pt has no complains.   BP 105/87 (BP Location: Right Wrist)   Pulse 92   Temp (!) 97.5 F (36.4 C) (Oral)   Resp 17   Wt 100.8 kg   LMP 08/18/2014 (LMP Unknown) Comment: menopausal: chemo induced  SpO2 96%   BMI 35.86 kg/m  . Will monitor.

## 2019-12-27 NOTE — Progress Notes (Signed)
  Date: 12/27/2019  Patient name: [place] record number: [id number]  Date of birth: 02/09/1971        I have seen and evaluated this patient and I have discussed the plan of care with the house staff. Please see Dr. Aurelio Jew note for complete details. I concur with her findings.  Sid Falcon, MD 12/27/2019, 8:27 PM

## 2019-12-27 NOTE — Progress Notes (Signed)
1125 Came to see pt to walk. Pt stated she does not feel up to it. Stated walked short distance yesterday and dizzy. Will continue to follow. Encouraged her to ask staff to walk if she feels better later. Graylon Good RN BSN 12/27/2019 11:28 AM

## 2019-12-28 LAB — MAGNESIUM: Magnesium: 2.2 mg/dL (ref 1.7–2.4)

## 2019-12-28 LAB — BASIC METABOLIC PANEL
Anion gap: 13 (ref 5–15)
BUN: 41 mg/dL — ABNORMAL HIGH (ref 6–20)
CO2: 26 mmol/L (ref 22–32)
Calcium: 9.4 mg/dL (ref 8.9–10.3)
Chloride: 98 mmol/L (ref 98–111)
Creatinine, Ser: 3.14 mg/dL — ABNORMAL HIGH (ref 0.44–1.00)
GFR calc Af Amer: 19 mL/min — ABNORMAL LOW (ref >60)
GFR calc non Af Amer: 17 mL/min — ABNORMAL LOW (ref >60)
Glucose, Bld: 122 mg/dL — ABNORMAL HIGH (ref 70–99)
Potassium: 3.6 mmol/L (ref 3.5–5.1)
Sodium: 137 mmol/L (ref 135–145)

## 2019-12-28 LAB — CBC
HCT: 43.2 % (ref 36.0–46.0)
Hemoglobin: 13.2 g/dL (ref 12.0–15.0)
MCH: 27.6 pg (ref 26.0–34.0)
MCHC: 30.6 g/dL (ref 30.0–36.0)
MCV: 90.2 fL (ref 80.0–100.0)
Platelets: 198 10*3/uL (ref 150–400)
RBC: 4.79 MIL/uL (ref 3.87–5.11)
RDW: 18.7 % — ABNORMAL HIGH (ref 11.5–15.5)
WBC: 6.3 10*3/uL (ref 4.0–10.5)
nRBC: 1.8 % — ABNORMAL HIGH (ref 0.0–0.2)

## 2019-12-28 LAB — COOXEMETRY PANEL
Carboxyhemoglobin: 1.2 % (ref 0.5–1.5)
Methemoglobin: 0.9 % (ref 0.0–1.5)
O2 Saturation: 66.4 %
Total hemoglobin: 13.7 g/dL (ref 12.0–16.0)

## 2019-12-28 MED ORDER — TORSEMIDE 20 MG PO TABS
20.0000 mg | ORAL_TABLET | Freq: Every day | ORAL | Status: DC
  Administered 2019-12-28 – 2019-12-29 (×2): 20 mg via ORAL
  Filled 2019-12-28 (×2): qty 1

## 2019-12-28 NOTE — Progress Notes (Signed)
  Date: 12/28/2019  Patient name: JONAI WEYLAND  Medical record number: [id number]  Date of birth: 05-02-1971   This patient's plan of care was discussed with the house staff. Please see Dr. Ronnald Ramp' note for complete details. I concur with her findings.   Sid Falcon, MD 12/28/2019, 8:49 PM

## 2019-12-28 NOTE — Progress Notes (Signed)
Subjective:   Pt seen at the bedside this morning. States she has a weird feeling in her chest that started overnight. It doesn't really hurt but feels like it's beating anxiously and shaking her on the inside. Sitting up in the chair, on room air. Denies shortness of breath or chest pain. Asking for a change in her diet order as she has limited options on the renal/HH diet.  Objective:  Vital signs in last 24 hours: Vitals:   12/27/19 2344 12/28/19 0000 12/28/19 0500 12/28/19 0558  BP: (!) 119/91 105/90  119/89  Pulse: 87 87 92 96  Resp: (!) 21 16 (!) 23 14  Temp:    97.7 F (36.5 C)  TempSrc:    Oral  SpO2: 100% 97% 91% 95%  Weight:    101.7 kg   Physical Exam Vitals and nursing note reviewed.  Constitutional:      General: She is not in acute distress.    Appearance: She is not ill-appearing.  Cardiovascular:     Rate and Rhythm: Normal rate and regular rhythm.     Heart sounds: Murmur present.  Pulmonary:     Effort: Pulmonary effort is normal.     Comments: On room air.  Musculoskeletal:     Comments: +1 pitting edema above the ankle  Skin:    General: Skin is warm and dry.  Neurological:     Mental Status: She is alert.     Intake/Output Summary (Last 24 hours) at 12/28/2019 1306 Last data filed at 12/28/2019 0600 Gross per 24 hour  Intake 271.2 ml  Output 500 ml  Net -228.8 ml   RHC 12/26/2019 RA = 11 RV = 43/5 PA = 19/3 (10) PCW = 3 Fick cardiac output/index = 5.22/2.5 Thermo CO/CI = 3.1/1.5 (liekly inaccurate due to severe TR) PVR = 1.3 WU Ao sat = 95% PA sat = 63%, 64%  Assessment - severe TR with elevated RA pressures and prominent CV waves - pulmonic stenosis - normal CO by fick - low left-sided filing pressures  Assessment/Plan:  Active Problems:   Acute exacerbation of CHF (congestive heart failure) (HCC)   Acute on chronic HFrEF (heart failure with reduced ejection fraction) (HCC)   Acute renal failure superimposed on chronic kidney  disease ([place])  Ms. 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   Acute on chronic heart failure exacerbationfrom NICM Frequent PVCs LBBB Work-up including echo and RHC consistent with HF due to severe TR and RHF secondary to progression of carcinoid valve disease - resume diuresis today per AHF below - wt stable from last two days, but up 1kg from lowest wt on 3/18 - CVP remains elevated at 16 - kidney function unchanged after holding diuresis for two days, Cr 3 -offmetoprolol/entresto/spiro -replete Mg and K to keep Mg > 2 and K > 4 - diet changed to low Na per Dr. Radford Pax  Advanced heart failure consulted, appreciate their input in this case - co-ox 66%  on milrinone 0.175mcg - given stable co-ox and unchanged wt, will stop milrinone  - start torsemide 20mg  daily - follow UOP/creatinine - prognosis poor given RV failure related to carcinoid disease and LV EF of 20% suspected to be due to LBBB - surgical treatment of R sided heart valves would be  high risk with co-existing LV failure and renal dysfunction -decreaseamiodarone to 200mg  daily  - consider CRT with LV lead only oupatient, EP consulted   Carcinoid tumor metastatic to the liver -monthly somatuline IM injections for the carcinoid tumor. - nothing to do acutely - continue outpatient follow-up with Dr. Marin Olp Stage IIB ductal triple negative L breast cancer -Infiltrating ductal carcinoma of L breast diagnosed and treated 32yrago with chemo   Prior to Admission Living Arrangement: home Anticipated Discharge Location: home Barriers to Discharge: clinical improvement Dispo: Anticipated discharge pending clinical course, maybe 2-3 days.  Ladona Horns, MD 12/28/2019, 7:39  AM Pager: [phone]

## 2019-12-28 NOTE — Progress Notes (Signed)
Patient ID: Amber Bautista, female   DOB: 07/09/1971, 49 y.o.   MRN: [id number]     Advanced Heart Failure Rounding Note  PCP-Cardiologist: Shirlee More, MD   Subjective:    Co-ox 66% on milrinone 0.125.  She has been off diuretics for a couple of days, creatinine is basically unchanged at 3.1.  Short of breath last night (orthopnea), feels fine sitting up in chair. Weight is staying fairly stable.   Minimal PVCs now on amiodarone.   RHC (milrinone 0.125):  RA = 11 RV = 43/5 PA = 19/3 (10) PCW = 3 Fick cardiac output/index = 5.22/2.5 Thermo CO/CI = 3.1/1.5 (liekly inaccurate due to severe TR) PVR = 1.3 WU Ao sat = 95% PA sat = 63%, 64%   Objective:   Weight Range: 101.7 kg Body mass index is 36.2 kg/m.   Vital Signs:   Temp:  [97.7 F (36.5 C)] 97.7 F (36.5 C) (03/21 0558) Pulse Rate:  [87-96] 96 (03/21 0558) Resp:  [14-26] 16 (03/21 0700) BP: (105-124)/(80-97) 105/80 (03/21 0800) SpO2:  [87 %-100 %] 95 % (03/21 0558) Weight:  [101.7 kg] 101.7 kg (03/21 0558) Last BM Date: 12/27/19  Weight change: Filed Weights   12/26/19 0419 12/27/19 0005 12/28/19 0558  Weight: 100.8 kg 102.3 kg 101.7 kg    Intake/Output:   Intake/Output Summary (Last 24 hours) at 12/28/2019 0952 Last data filed at 12/28/2019 0600 Gross per 24 hour  Intake 526.8 ml  Output 500 ml  Net 26.8 ml      Physical Exam   General: NAD Neck: JVP 12-14 cm, no thyromegaly or thyroid nodule.  Lungs: Clear to auscultation bilaterally with normal respiratory effort. CV: Nondisplaced PMI.  Heart regular S1/S2, no S3/S4, 3/6 HSM LLSB.  1+ ankle edema.   Abdomen: Soft, nontender, no hepatosplenomegaly, no distention.  Skin: Intact without lesions or rashes.  Neurologic: Alert and oriented x 3.  Psych: Normal affect. Extremities: No clubbing or cyanosis.  HEENT: Normal.    Telemetry   Sinus 90s, LBBB. Personally reviewed   EKG    N/a   Labs    CBC Recent Labs    12/26/19 1155  12/28/19 0500  WBC 5.9 6.3  HGB 12.8 13.2  HCT 40.6 43.2  MCV 88.6 90.2  PLT 189 401   Basic Metabolic Panel Recent Labs    12/27/19 0500 12/28/19 0500  NA 137 137  K 3.2* 3.6  CL 98 98  CO2 26 26  GLUCOSE 114* 122*  BUN 41* 41*  CREATININE 3.05* 3.14*  CALCIUM 8.6* 9.4  MG 2.1 2.2   Liver Function Tests No results for input(s): AST, ALT, ALKPHOS, BILITOT, PROT, ALBUMIN in the last 72 hours. No results for input(s): LIPASE, AMYLASE in the last 72 hours. Cardiac Enzymes No results for input(s): CKTOTAL, CKMB, CKMBINDEX, TROPONINI in the last 72 hours.  BNP: BNP (last 3 results) Recent Labs    07/21/19 1334 10/13/19 1300 12/20/19 0700  BNP 69.8 301.6* 382.1*    ProBNP (last 3 results) Recent Labs    04/09/19 1458 05/20/19 1611 12/17/19 1532  PROBNP 469* 1,071* 5,909*     D-Dimer No results for input(s): DDIMER in the last 72 hours. Hemoglobin A1C No results for input(s): HGBA1C in the last 72 hours. Fasting Lipid Panel No results for input(s): CHOL, HDL, LDLCALC, TRIG, CHOLHDL, LDLDIRECT in the last 72 hours. Thyroid Function Tests No results for input(s): TSH, T4TOTAL, T3FREE, THYROIDAB in the last 72 hours.  Invalid input(s):  FREET3  Other results:   Imaging    No results found.   Medications:     Scheduled Medications: . amiodarone  200 mg Oral Daily  . Chlorhexidine Gluconate Cloth  6 each Topical Daily  . enoxaparin (LOVENOX) injection  30 mg Subcutaneous Q24H  . pneumococcal 23 valent vaccine  0.5 mL Intramuscular Tomorrow-1000  . potassium chloride  40 mEq Oral BID  . ramelteon  8 mg Oral QHS  . sodium chloride flush  10-40 mL Intracatheter Q12H  . sodium chloride flush  3 mL Intravenous Q12H  . sodium chloride flush  3 mL Intravenous Q12H    Infusions: . sodium chloride    . milrinone 0.125 mcg/kg/min (12/27/19 1544)    PRN Medications: sodium chloride, acetaminophen **OR** acetaminophen, acetaminophen, ondansetron  (ZOFRAN) IV, sodium chloride flush, sodium chloride flush     Assessment/Plan   1. Acute systolic HF due to NICM  - Echo 2013 EF 25-30%. Cath normal cors - Echo 2020 EF 45-50% - Echo 12/19/19: EF 20% septal DK. RV ok TV restricted severe TR  - suspect LV dysfunction due to PVCs +/- LBBB. PVCs/bigeminy have come in bursts. Unclear if overall burden high enough to cause CM but possibly so  - CO-OX stable 66% on milrinone 0.125 mcg.  With unchanged creatinine and stable co-ox, will stop milrinone today.  - RHC showed RV failure in setting of tricuspid and pulmonic valve disease with low LV filling pressure.  Diuretics had been on hold.  CVP now rising, up to 16 and she had orthopnea last night.  Will start on torsemide 20 mg daily and follow UOP/creatinine.  - Hold b-blocker - Off Entresto/spiro due to AKI - Prognosis seems quite poor with several major health issues including metastatic carcinoid syndrome. She has RV failure in the setting of carcinoid heart disease with TV and PV involvement, but she also has LV EF 20% that is likely NOT carcinoid-related, suspect due to LBBB CMP maybe worsened by PVCs.  CRT is a consideration, and PVCs have been suppressed well with amiodarone. Surgical treatment of the right-sided heart valves would be very high risk with co-existing LV failure and renal dysfunction.   2. Frequent PVCs. - Zio Patch performed 04/07/2019 showed frequent PVCs with a 7.4% burden  - On tele, continues to have frequent PVCs ~25/hr which come in bursts - EP consulted. Consider LV lead after discharge by Dr Caryl Comes  - Initially on IV amio and now switched to 200 mg a day.  - PVCs now appear suppressed.   3. Acute on CKD3 - creatinine 1.3 in 11/20 -> 1.7 1/21 -> - Creatinine  2.7>2.6  >2.6>>3.03 > 3.0 > 3.1.  - suspect due to low output/cardiorenal - Creatinine has stabilized, I am going to try her off milrinone.   - With rising CVP, will start po diuretic (torsemide 20 daily).    - Follow BMP   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  This has caused RV failure.   - As above, surgery would be high risk with renal dysfunction and LV dysfunction.   5. Metastatic carcinoid syndrome with liver mets - followed by Dr. Marin Olp - on somatuline  6. LBBB/IVCD - Would consider CRT.   7. Hypokalemia/Hypomagnesemia - supp to keep K> 4.0, Mg > 2.0 - Getting KCl.   Length of Stay: 7  Loralie Champagne, MD  12/28/2019, 9:52 AM  Advanced Heart Failure Team Pager [phone] (M-F; [place])  Please contact [facility] for night-coverage after hours (4p -7a ) and weekends on [email or web address]

## 2019-12-28 NOTE — Progress Notes (Addendum)
Pt suddenly had SOB, sat at the edge of the bed and called, she complains  As if trying  to "catch my Breath"  Noted that pt desat to 88% then backup gradually to 94%. Pt hooked to 1L O2 for now. Pt still A&O, Vs stable , Lungs diminished. Will monitor.  Spoke to Dr Sheppard Coil, informed about the pt. No further order this time but to monitor pt and will update him.  0230 pt stated 'I feel better now"  Will monitor.

## 2019-12-29 DIAGNOSIS — C799 Secondary malignant neoplasm of unspecified site: Secondary | ICD-10-CM

## 2019-12-29 LAB — BASIC METABOLIC PANEL
Anion gap: 14 (ref 5–15)
BUN: 41 mg/dL — ABNORMAL HIGH (ref 6–20)
CO2: 26 mmol/L (ref 22–32)
Calcium: 9.1 mg/dL (ref 8.9–10.3)
Chloride: 98 mmol/L (ref 98–111)
Creatinine, Ser: 2.94 mg/dL — ABNORMAL HIGH (ref 0.44–1.00)
GFR calc Af Amer: 21 mL/min — ABNORMAL LOW (ref >60)
GFR calc non Af Amer: 18 mL/min — ABNORMAL LOW (ref >60)
Glucose, Bld: 114 mg/dL — ABNORMAL HIGH (ref 70–99)
Potassium: 3.4 mmol/L — ABNORMAL LOW (ref 3.5–5.1)
Sodium: 138 mmol/L (ref 135–145)

## 2019-12-29 LAB — COOXEMETRY PANEL
Carboxyhemoglobin: 1.1 % (ref 0.5–1.5)
Carboxyhemoglobin: 1 % (ref 0.5–1.5)
Methemoglobin: 0.8 % (ref 0.0–1.5)
Methemoglobin: 0.9 % (ref 0.0–1.5)
O2 Saturation: 53 %
O2 Saturation: 54.6 %
Total hemoglobin: 13.7 g/dL (ref 12.0–16.0)
Total hemoglobin: 13.2 g/dL (ref 12.0–16.0)

## 2019-12-29 LAB — MAGNESIUM: Magnesium: 1.9 mg/dL (ref 1.7–2.4)

## 2019-12-29 MED ORDER — MAGNESIUM SULFATE 2 GM/50ML IV SOLN
2.0000 g | Freq: Once | INTRAVENOUS | Status: AC
  Administered 2019-12-29: 2 g via INTRAVENOUS
  Filled 2019-12-29: qty 50

## 2019-12-29 MED ORDER — SODIUM CHLORIDE 0.9 % IV SOLN: INTRAVENOUS | Status: DC

## 2019-12-29 MED ORDER — HYDROXYZINE HCL 10 MG PO TABS
10.0000 mg | ORAL_TABLET | Freq: Once | ORAL | Status: AC | PRN
  Administered 2019-12-29: 10 mg via ORAL
  Filled 2019-12-29: qty 1

## 2019-12-29 MED ORDER — TORSEMIDE 20 MG PO TABS
20.0000 mg | ORAL_TABLET | Freq: Two times a day (BID) | ORAL | Status: DC
  Administered 2019-12-29 – 2019-12-30 (×2): 20 mg via ORAL
  Filled 2019-12-29 (×2): qty 1

## 2019-12-29 NOTE — Plan of Care (Signed)
  Problem: Clinical Measurements: Goal: Respiratory complications will improve Outcome: Completed/Met Goal: Cardiovascular complication will be avoided Outcome: Completed/Met   Problem: Coping: Goal: Level of anxiety will decrease Outcome: Completed/Met   Problem: Elimination: Goal: Will not experience complications related to bowel motility Outcome: Completed/Met Goal: Will not experience complications related to urinary retention Outcome: Completed/Met   Problem: Pain Managment: Goal: General experience of comfort will improve Outcome: Completed/Met   Problem: Skin Integrity: Goal: Risk for impaired skin integrity will decrease Outcome: Completed/Met   Problem: Activity: Goal: Capacity to carry out activities will improve Outcome: Completed/Met

## 2019-12-29 NOTE — Progress Notes (Addendum)
Electrophysiology Rounding Note  Patient Name: Amber Bautista Date of Encounter: 12/29/2019  Primary Cardiologist: Dr. Bettina Gavia Electrophysiologist: Dr. Curt Bears   Subjective   Feeling OK currently. Continues to have intermittent PND.   Inpatient Medications    Scheduled Meds: . amiodarone  200 mg Oral Daily  . Chlorhexidine Gluconate Cloth  6 each Topical Daily  . enoxaparin (LOVENOX) injection  30 mg Subcutaneous Q24H  . pneumococcal 23 valent vaccine  0.5 mL Intramuscular Tomorrow-1000  . potassium chloride  40 mEq Oral BID  . ramelteon  8 mg Oral QHS  . sodium chloride flush  10-40 mL Intracatheter Q12H  . sodium chloride flush  3 mL Intravenous Q12H  . sodium chloride flush  3 mL Intravenous Q12H  . torsemide  20 mg Oral Daily   Continuous Infusions: . sodium chloride     PRN Meds: sodium chloride, acetaminophen **OR** acetaminophen, acetaminophen, ondansetron (ZOFRAN) IV, sodium chloride flush, sodium chloride flush   Vital Signs    Vitals:   12/28/19 2000 12/28/19 2100 12/29/19 0100 12/29/19 0400  BP: 115/87   117/77  Pulse: 83   87  Resp: (!) 27   17  Temp: 97.6 F (36.4 C) 97.8 F (36.6 C) (!) 97.3 F (36.3 C)   TempSrc: Oral Oral Oral   SpO2: 100%   97%  Weight:    101.9 kg    Intake/Output Summary (Last 24 hours) at 12/29/2019 0855 Last data filed at 12/29/2019 0840 Gross per 24 hour  Intake 460 ml  Output 1850 ml  Net -1390 ml   Filed Weights   12/27/19 0005 12/28/19 0558 12/29/19 0400  Weight: 102.3 kg 101.7 kg 101.9 kg    Physical Exam    GEN- The patient is well appearing, alert and oriented x 3 today.   Head- normocephalic, atraumatic Eyes-  Sclera clear, conjunctiva pink Ears- hearing intact Oropharynx- clear Neck- supple Lungs- Clear to ausculation bilaterally, normal work of breathing Heart- Regular rate and rhythm, no murmurs, rubs or gallops GI- soft, NT, ND, + BS Extremities- no clubbing, cyanosis, or edema Skin- no rash or  lesion Psych- euthymic mood, full affect Neuro- strength and sensation are intact  Labs    CBC Recent Labs    12/26/19 1155 12/28/19 0500  WBC 5.9 6.3  HGB 12.8 13.2  HCT 40.6 43.2  MCV 88.6 90.2  PLT 189 130   Basic Metabolic Panel Recent Labs    12/27/19 0500 12/27/19 0500 12/28/19 0500 12/29/19 0500  NA 137   < > 137 138  K 3.2*   < > 3.6 3.4*  CL 98   < > 98 98  CO2 26   < > 26 26  GLUCOSE 114*   < > 122* 114*  BUN 41*   < > 41* 41*  CREATININE 3.05*   < > 3.14* 2.94*  CALCIUM 8.6*   < > 9.4 9.1  MG 2.1  --  2.2  --    < > = values in this interval not displayed.   Liver Function Tests No results for input(s): AST, ALT, ALKPHOS, BILITOT, PROT, ALBUMIN in the last 72 hours. No results for input(s): LIPASE, AMYLASE in the last 72 hours. Cardiac Enzymes No results for input(s): CKTOTAL, CKMB, CKMBINDEX, TROPONINI in the last 72 hours. BNP Invalid input(s): POCBNP D-Dimer No results for input(s): DDIMER in the last 72 hours. Hemoglobin A1C No results for input(s): HGBA1C in the last 72 hours. Fasting Lipid Panel No results  for input(s): CHOL, HDL, LDLCALC, TRIG, CHOLHDL, LDLDIRECT in the last 72 hours. Thyroid Function Tests No results for input(s): TSH, T4TOTAL, T3FREE, THYROIDAB in the last 72 hours.  Invalid input(s): FREET3  Telemetry    NSR 90s, LBBB (personally reviewed)  Radiology    No results found.   Patient Profile     Amber Bautista is a 49 y.o. female with a past medical history significant for Chronic systolic CHF and frequent PVCs. She was admitted for SOB and sense of impending doom.   Assessment & Plan    1. PVCs Zio patch 03/18/2019 showed PVC burden 7.4%. Now very infrequent on tele.  Have improved on amiodarone. Continue 200 mg daily 2. Prolonged QTc Had prolonged on amiodarone. Now ~470-480 when corrected for QRS on amio 200 mg daily.  3. 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 Acute on chronic systolic CHF - review of serial echos demonstrates progressively worsening LV dysfunction.  5. AKI on CKD III Creatinine baseline ~1.5. Up to 3.1 this admission 6. Metastatic carcinoid syndrome with liver mets Followed by Dr. Marin Olp On Somatuline  For questions or updates, please contact [facility] Please consult [email or web address] for contact info under Cardiology/STEMI.  Signed, Shirley Friar, PA-C  12/29/2019, 8:55 AM

## 2019-12-29 NOTE — Progress Notes (Signed)
   Subjective: Pt seen at the bedside this morning. Feeling alright. Endorses orthopnea last evening. Pt states she is having good urine output after resuming diuretics yesterday. Has no acute concerns, but is interested in cardiology's input today regarding a potential pacemaker in the future.    Objective:  Vital signs in last 24 hours: Vitals:   12/28/19 2000 12/28/19 2100 12/29/19 0100 12/29/19 0400  BP: 115/87   117/77  Pulse: 83   87  Resp: (!) 27   17  Temp: 97.6 F (36.4 C) 97.8 F (36.6 C) (!) 97.3 F (36.3 C)   TempSrc: Oral Oral Oral   SpO2: 100%   97%  Weight:    101.9 kg   Physical Exam Constitutional:      General: She is not in acute distress.    Appearance: She is not ill-appearing.  Cardiovascular:     Rate and Rhythm: Normal rate and regular rhythm.     Heart sounds: Murmur present.  Musculoskeletal:     Comments: +1 bilateral LE pitting edema to mid-shin  Skin:    General: Skin is warm and dry.  Neurological:     Mental Status: She is alert.    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   Acute on chronic heart failure exacerbationfrom NICM Frequent PVCs LBBB Work-up including echo and RHC consistent with HF due to severe TR and RHF secondary to progression of carcinoid valve disease. - milrinone discontinued yesterday - started on torsemide 20mg  daily yesterday - net negative 1.7L yesterday with 1.8L urine output - wt  unchanged -offmetoprolol/entresto/spiro -replete Mg and K to keep Mg > 2 and K > 4  Advanced heart failure consulted, appreciate their input in this case - co-ox down to 53% today off milrinone, will repeat in the PM - Cr improved to 2.94 from 3.14, though GFR stable - CVP elevated to 16 in the setting of severe TR - continue torsemide  - prognosis poor given RV failure related to carcinoid disease and LV EF of 20% suspected to be due to LBBB - surgical treatment of R sided heart valves would be high risk with co-existing LV failure and renal dysfunction - continue amiodarone 200mg  daily  EP consulted - PVCs now infrequent on tele - continue amiodarone 200mg  daily - may benefit from BiV ICD after goal directed medical therapy, but uncertain  Prior to Admission Living Arrangement: home Anticipated Discharge Location: home Barriers to Discharge: clinical improvement Dispo: Anticipated discharge in approximately 2-3 day(s).   Amber Horns, MD 12/29/2019, 6:59 AM Pager: [phone]

## 2019-12-29 NOTE — Progress Notes (Addendum)
Patient ID: Amber Bautista, female   DOB: [birth year], 49 y.o.   MRN: [id number]     Advanced Heart Failure Rounding Note  PCP-Cardiologist: Shirlee More, MD   Subjective:    Milrinone discontinued yesterday. Co-ox dropped from 66%>>53%.   PO diuretics resumed yesterday, torsemide 20 mg. -1.9L in UOP. Wt unchanged, remains 224 lb. Scr improved, 3.14>>2.94. Continues w/ PND/ orthopnea and exertional dyspnea. CVP 16 today but also in the setting of severe TR)  Less PVCs w/ amiodarone. < 5/hr on tele review.    RHC 12/26/19 (on milrinone 0.125):  RA = 11 RV = 43/5 PA = 19/3 (10) PCW = 3 Fick cardiac output/index = 5.22/2.5 Thermo CO/CI = 3.1/1.5 (liekly inaccurate due to severe TR) PVR = 1.3 WU Ao sat = 95% PA sat = 63%, 64%   Objective:   Weight Range: 101.9 kg Body mass index is 36.27 kg/m.   Vital Signs:   Temp:  [97.3 F (36.3 C)-97.8 F (36.6 C)] 97.3 F (36.3 C) (03/22 0100) Pulse Rate:  [83-137] 87 (03/22 0400) Resp:  [11-27] 20 (03/22 0840) BP: (115-134)/(77-98) 134/98 (03/22 0840) SpO2:  [96 %-100 %] 97 % (03/22 0400) Weight:  [101.9 kg] 101.9 kg (03/22 0400) Last BM Date: 12/29/19  Weight change: Filed Weights   12/27/19 0005 12/28/19 0558 12/29/19 0400  Weight: 102.3 kg 101.7 kg 101.9 kg    Intake/Output:   Intake/Output Summary (Last 24 hours) at 12/29/2019 1053 Last data filed at 12/29/2019 0845 Gross per 24 hour  Intake 700 ml  Output 1850 ml  Net -1150 ml      Physical Exam   PHYSICAL EXAM: CVP 16  General:  Chronically ill appearing, obese female. No respiratory difficulty HEENT: normal anicteric  Neck: supple. elevated JVD to ear prominent v-waves. Carotids 2+ bilat; no bruits. No lymphadenopathy or thyromegaly appreciated. Cor: PMI nondisplaced. Regular rate & rhythm. 3/6 HSM LLSB Lungs: clear no wheeze Abdomen: obese, soft, nontender, nondistended. No hepatosplenomegaly. No bruits or masses. Good bowel sounds. Extremities: no  cyanosis, clubbing, rash, 1+ bilateral LE edema R>L Neuro: alert & oriented x 3, cranial nerves grossly intact. moves all 4 extremities w/o difficulty. Affect pleasant   Telemetry   NSR 90s, LBBB. Few PVCs, < 5/ hr Personally reviewed   EKG    N/a   Labs    CBC Recent Labs    12/26/19 1155 12/28/19 0500  WBC 5.9 6.3  HGB 12.8 13.2  HCT 40.6 43.2  MCV 88.6 90.2  PLT 189 630   Basic Metabolic Panel Recent Labs    12/28/19 0500 12/29/19 0500  NA 137 138  K 3.6 3.4*  CL 98 98  CO2 26 26  GLUCOSE 122* 114*  BUN 41* 41*  CREATININE 3.14* 2.94*  CALCIUM 9.4 9.1  MG 2.2 1.9   Liver Function Tests No results for input(s): AST, ALT, ALKPHOS, BILITOT, PROT, ALBUMIN in the last 72 hours. No results for input(s): LIPASE, AMYLASE in the last 72 hours. Cardiac Enzymes No results for input(s): CKTOTAL, CKMB, CKMBINDEX, TROPONINI in the last 72 hours.  BNP: BNP (last 3 results) Recent Labs    07/21/19 1334 10/13/19 1300 12/20/19 0700  BNP 69.8 301.6* 382.1*    ProBNP (last 3 results) Recent Labs    04/09/19 1458 05/20/19 1611 12/17/19 1532  PROBNP 469* 1,071* 5,909*     D-Dimer No results for input(s): DDIMER in the last 72 hours. Hemoglobin A1C No results for input(s): HGBA1C in the last  72 hours. Fasting Lipid Panel No results for input(s): CHOL, HDL, LDLCALC, TRIG, CHOLHDL, LDLDIRECT in the last 72 hours. Thyroid Function Tests No results for input(s): TSH, T4TOTAL, T3FREE, THYROIDAB in the last 72 hours.  Invalid input(s): FREET3  Other results:   Imaging    No results found.   Medications:     Scheduled Medications: . amiodarone  200 mg Oral Daily  . Chlorhexidine Gluconate Cloth  6 each Topical Daily  . enoxaparin (LOVENOX) injection  30 mg Subcutaneous Q24H  . pneumococcal 23 valent vaccine  0.5 mL Intramuscular Tomorrow-1000  . potassium chloride  40 mEq Oral BID  . ramelteon  8 mg Oral QHS  . sodium chloride flush  10-40 mL  Intracatheter Q12H  . sodium chloride flush  3 mL Intravenous Q12H  . sodium chloride flush  3 mL Intravenous Q12H  . torsemide  20 mg Oral Daily    Infusions: . sodium chloride      PRN Medications: sodium chloride, acetaminophen **OR** acetaminophen, acetaminophen, ondansetron (ZOFRAN) IV, sodium chloride flush, sodium chloride flush     Assessment/Plan   1. Acute systolic HF due to NICM  - Echo 2013 EF 25-30%. Cath normal cors - Echo 2020 EF 45-50% - Echo 12/19/19: EF 20% septal DK. RV ok TV restricted severe TR  - suspect LV dysfunction due to PVCs +/- LBBB. PVCs/bigeminy have come in bursts. Unclear if overall burden high enough to cause CM but possibly so (now improved w/ amiodarone) - RHC on 0.125 of milrinone showed RV failure in setting of tricuspid and pulmonic valve disease with low LV filling pressure.   - Milrinone discontinued 3/21. Co-ox down to 53% today. Will repeat co-ox again this afternoon.  May need to restart. ? If milrinone dependent.    - Hold b-blocker for low output  - Off Entresto/spiro due to AKI - PO diuretics resumed yesterday, on Torsemide 20 mg. -1.9 L in UOP. CVP 16 but in the setting of severe TR. Continue torsemide 20 mg. SCr improved, 3.14>>2.94.  - Prognosis seems quite poor with several major health issues including metastatic carcinoid syndrome. She has RV failure in the setting of carcinoid heart disease with TV and PV involvement, but she also has LV EF 20% that is likely NOT carcinoid-related, suspect due to LBBB CMP maybe worsened by PVCs.  CRT is a consideration, and PVCs have been suppressed well with amiodarone. Surgical treatment of the right-sided heart valves would be very high risk with co-existing LV failure and renal dysfunction.   2. Frequent PVCs. - Zio Patch performed 04/07/2019 showed frequent PVCs with a 7.4% burden  - Improved on amio. PVC rate < 5/ hr. Continue 200 mg daily  - EP consulted. Consider LV lead after discharge by  Dr Caryl Comes  .   3. Acute on CKD3 - creatinine 1.3 in 11/20 -> 1.7 1/21 -> - Creatinine  2.7>2.6  >2.6>>3.03 > 3.0 > 3.1>2.94.  - suspect due to low output/cardiorenal - SCr ok off milrinone. Continue to follow.     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  This has caused RV failure.   - As above, surgery would be high risk with renal dysfunction and LV dysfunction.   5. Metastatic carcinoid syndrome with liver mets - followed by Dr. Marin Olp - on somatuline  6. LBBB/IVCD - Would consider CRT.   7. Hypokalemia/Hypomagnesemia - supp to keep K>  4.0, Mg > 2.0 - K 3.4. Getting KCl, 40 mEq bid .   Length of Stay: [address] [place]  12/29/2019, 10:53 AM  Advanced Heart Failure Team Pager [phone] (M-F; 7a - 4p)  Please contact [facility] for night-coverage after hours (4p -7a ) and weekends on [email or web address]   Patient seen and examined with the above-signed Advanced Practice Provider and/or Housestaff. I personally reviewed laboratory data, imaging studies and relevant notes. I independently examined the patient and formulated the important aspects of the plan. I have edited the note to reflect any of my changes or salient points. I have personally discussed the plan with the patient and/or family.  On [place] cath last week main issues was severe TR. L-sided numbers ok.   Milrinone off. Co-ox has dropped and developed orthopnea overnight suggesting a component of left heart failure.   Have restarted torsemide. Creatinine some better. Diuresing well.   PVCs now suppressed with amio.   Options very limited. Will plan TEE tomorrow to get better look at TV and PV to see if a clip might be an option.   Otherwise continue diuresis. If LV function not improving with PVC suppression can consider CRT.   Glori Bickers, MD  2:57 PM

## 2019-12-30 ENCOUNTER — Inpatient Hospital Stay (HOSPITAL_COMMUNITY): Payer: Medicare HMO

## 2019-12-30 ENCOUNTER — Inpatient Hospital Stay (HOSPITAL_COMMUNITY): Payer: Medicare HMO | Admitting: Certified Registered Nurse Anesthetist

## 2019-12-30 ENCOUNTER — Encounter (HOSPITAL_COMMUNITY)
Admission: EM | Disposition: A | Discharge status: Hospice | Payer: Self-pay | Source: Home / Self Care | Attending: Internal Medicine

## 2019-12-30 ENCOUNTER — Encounter (HOSPITAL_COMMUNITY): Payer: Self-pay | Admitting: Internal Medicine

## 2019-12-30 DIAGNOSIS — I361 Nonrheumatic tricuspid (valve) insufficiency: Secondary | ICD-10-CM

## 2019-12-30 HISTORY — PX: TEE WITHOUT CARDIOVERSION: SHX5443

## 2019-12-30 LAB — BASIC METABOLIC PANEL
Anion gap: 12 (ref 5–15)
BUN: 43 mg/dL — ABNORMAL HIGH (ref 6–20)
CO2: 27 mmol/L (ref 22–32)
Calcium: 9 mg/dL (ref 8.9–10.3)
Chloride: 99 mmol/L (ref 98–111)
Creatinine, Ser: 3.17 mg/dL — ABNORMAL HIGH (ref 0.44–1.00)
GFR calc Af Amer: 19 mL/min — ABNORMAL LOW (ref >60)
GFR calc non Af Amer: 16 mL/min — ABNORMAL LOW (ref >60)
Glucose, Bld: 123 mg/dL — ABNORMAL HIGH (ref 70–99)
Potassium: 3.5 mmol/L (ref 3.5–5.1)
Sodium: 138 mmol/L (ref 135–145)

## 2019-12-30 LAB — COOXEMETRY PANEL
Carboxyhemoglobin: 1.1 % (ref 0.5–1.5)
Methemoglobin: 0.9 % (ref 0.0–1.5)
O2 Saturation: 58.1 %
Total hemoglobin: 13.3 g/dL (ref 12.0–16.0)

## 2019-12-30 SURGERY — ECHOCARDIOGRAM, TRANSESOPHAGEAL
Anesthesia: Monitor Anesthesia Care

## 2019-12-30 MED ORDER — TORSEMIDE 20 MG PO TABS
20.0000 mg | ORAL_TABLET | Freq: Two times a day (BID) | ORAL | Status: DC
  Administered 2019-12-31 – 2020-01-01 (×3): 20 mg via ORAL
  Filled 2019-12-30 (×3): qty 1

## 2019-12-30 MED ORDER — PHENYLEPHRINE 40 MCG/ML (10ML) SYRINGE FOR IV PUSH (FOR BLOOD PRESSURE SUPPORT)
PREFILLED_SYRINGE | INTRAVENOUS | Status: DC | PRN
  Administered 2019-12-30 (×4): 80 ug via INTRAVENOUS

## 2019-12-30 MED ORDER — SODIUM CHLORIDE 0.9 % IV SOLN: INTRAVENOUS | Status: DC | PRN

## 2019-12-30 MED ORDER — POTASSIUM CHLORIDE CRYS ER 20 MEQ PO TBCR
40.0000 meq | EXTENDED_RELEASE_TABLET | Freq: Once | ORAL | Status: AC
  Administered 2019-12-30: 40 meq via ORAL
  Filled 2019-12-30 (×2): qty 2

## 2019-12-30 MED ORDER — POTASSIUM CHLORIDE CRYS ER 20 MEQ PO TBCR
40.0000 meq | EXTENDED_RELEASE_TABLET | Freq: Once | ORAL | Status: AC
  Administered 2019-12-31: 40 meq via ORAL

## 2019-12-30 MED ORDER — FUROSEMIDE 10 MG/ML IJ SOLN
80.0000 mg | Freq: Once | INTRAMUSCULAR | Status: AC
  Administered 2019-12-30: 80 mg via INTRAVENOUS
  Filled 2019-12-30: qty 8

## 2019-12-30 MED ORDER — PROPOFOL 500 MG/50ML IV EMUL
INTRAVENOUS | Status: DC | PRN
  Administered 2019-12-30 (×2): 100 ug/kg/min via INTRAVENOUS

## 2019-12-30 MED ORDER — PROPOFOL 10 MG/ML IV BOLUS
INTRAVENOUS | Status: DC | PRN
  Administered 2019-12-30 (×3): 10 mg via INTRAVENOUS

## 2019-12-30 MED ORDER — MENTHOL 3 MG MT LOZG
1.0000 | LOZENGE | OROMUCOSAL | Status: DC | PRN
  Administered 2019-12-30: 3 mg via ORAL
  Filled 2019-12-30: qty 9

## 2019-12-30 NOTE — Progress Notes (Addendum)
Electrophysiology Rounding Note  Patient Name: Amber Bautista Date of Encounter: 12/30/2019  Primary Cardiologist: Dr. Bettina Gavia Electrophysiologist: Dr. Vonda Antigua. Caryl Comes   Subjective   Feeling OK this am. Had more SOB overnight.    Torsemide restarted yesterday. Cr back up to 3.17  Inpatient Medications    Scheduled Meds: . amiodarone  200 mg Oral Daily  . Chlorhexidine Gluconate Cloth  6 each Topical Daily  . enoxaparin (LOVENOX) injection  30 mg Subcutaneous Q24H  . pneumococcal 23 valent vaccine  0.5 mL Intramuscular Tomorrow-1000  . potassium chloride  40 mEq Oral BID  . ramelteon  8 mg Oral QHS  . sodium chloride flush  10-40 mL Intracatheter Q12H  . sodium chloride flush  3 mL Intravenous Q12H  . sodium chloride flush  3 mL Intravenous Q12H  . torsemide  20 mg Oral BID   Continuous Infusions: . sodium chloride    . sodium chloride 20 mL/hr at 12/30/19 0353   PRN Meds: sodium chloride, acetaminophen **OR** acetaminophen, acetaminophen, ondansetron (ZOFRAN) IV, sodium chloride flush, sodium chloride flush   Vital Signs    Vitals:   12/29/19 2027 12/30/19 0030 12/30/19 0422 12/30/19 0425  BP: (!) 133/106 (!) 124/99  (!) 125/107  Pulse: 84 88  95  Resp: 16 20  20   Temp: 98.6 F (37 C) 98.6 F (37 C)  98.7 F (37.1 C)  TempSrc: Oral Oral  Oral  SpO2: 99% 99%  94%  Weight:   100.9 kg     Intake/Output Summary (Last 24 hours) at 12/30/2019 0720 Last data filed at 12/30/2019 0425 Gross per 24 hour  Intake 1735.94 ml  Output 2550 ml  Net -814.06 ml   Filed Weights   12/28/19 0558 12/29/19 0400 12/30/19 0422  Weight: 101.7 kg 101.9 kg 100.9 kg    Physical Exam    GEN- The patient is well appearing, alert and oriented x 3 today.   Head- normocephalic, atraumatic Eyes-  Sclera clear, conjunctiva pink Ears- hearing intact Oropharynx- clear Neck- supple Lungs- Clear to ausculation bilaterally, normal work of breathing Heart- Regular rate and rhythm,  no murmurs, rubs or gallops GI- soft, NT, ND, + BS Extremities- no clubbing, cyanosis, or edema Skin- no rash or lesion Psych- euthymic mood, full affect Neuro- strength and sensation are intact  Labs    CBC Recent Labs    12/28/19 0500  WBC 6.3  HGB 13.2  HCT 43.2  MCV 90.2  PLT 235   Basic Metabolic Panel Recent Labs    12/28/19 0500 12/28/19 0500 12/29/19 0500 12/30/19 0407  NA 137   < > 138 138  K 3.6   < > 3.4* 3.5  CL 98   < > 98 99  CO2 26   < > 26 27  GLUCOSE 122*   < > 114* 123*  BUN 41*   < > 41* 43*  CREATININE 3.14*   < > 2.94* 3.17*  CALCIUM 9.4   < > 9.1 9.0  MG 2.2  --  1.9  --    < > = values in this interval not displayed.   Liver Function Tests No results for input(s): AST, ALT, ALKPHOS, BILITOT, PROT, ALBUMIN in the last 72 hours. No results for input(s): LIPASE, AMYLASE in the last 72 hours. Cardiac Enzymes No results for input(s): CKTOTAL, CKMB, CKMBINDEX, TROPONINI in the last 72 hours. BNP Invalid input(s): POCBNP D-Dimer No results for input(s): DDIMER in the last 72 hours. Hemoglobin A1C No  results for input(s): HGBA1C in the last 72 hours. Fasting Lipid Panel No results for input(s): CHOL, HDL, LDLCALC, TRIG, CHOLHDL, LDLDIRECT in the last 72 hours. Thyroid Function Tests No results for input(s): TSH, T4TOTAL, T3FREE, THYROIDAB in the last 72 hours.  Invalid input(s): FREET3  Telemetry    NSR with LBBB 80-90s (personally reviewed)  Radiology    No results found.   Patient Profile     Amber Bautista is a 49 y.o. female with a past medical history significant for Chronic systolic CHF and frequent PVCs. She was admitted for SOB and sense of impending doom.   Assessment & Plan    1. PVCs Zio patch 03/18/2019 showed PVC burden 7.4%. Improved on tele with 200 mg amiodarone daily.  2. Prolonged QTc Relatively stable on lower dose amiodarone.  QTc ~480-490 when corrected for QRS 3. LBBB Dr. Caryl Comes and Dr. Haroldine Laws discussing  possible benefit of LV pacing, but uncertain in setting of renal fauilre and carcinoid heart disease.  4. Acute on chronic systolic CHF  LV function worsened serially, now 20%. Plan for TEE today to further evaluate valvular disease.  5. AKI on CKD III Creatinine back up to 3.17 with resumption of torsemide.  6. Metastatic carcinoid syndrome with liver mets Followed by Dr. Marin Olp On Somatuline.   For questions or updates, please contact [facility] Please consult [email or web address] for contact info under Cardiology/STEMI.  Signed, Shirley Friar, PA-C  12/30/2019, 7:20 AM   Reviewed with patient and [facility] service  Renal failure worsening  Will discuss with Dr Reine Just re LBBB pacing and Ivabradine   Not sanguine

## 2019-12-30 NOTE — Anesthesia Preprocedure Evaluation (Addendum)
Anesthesia Evaluation  Patient identified by MRN, date of birth, ID band Patient awake    Reviewed: Allergy & Precautions, NPO status , Patient's Chart, lab work & pertinent test results  History of Anesthesia Complications Negative for: history of anesthetic complications  Airway Mallampati: II  TM Distance: >3 FB Neck ROM: Full    Dental  (+) Teeth Intact, Dental Advisory Given   Pulmonary neg pulmonary ROS,   Lungs clear anteriorly   + decreased breath sounds      Cardiovascular hypertension, +CHF (NICM, biventricular failure, EF 20%), + Orthopnea and + PND  + Valvular Problems/Murmurs (severe TR)  Rhythm:Regular Rate:Normal     Neuro/Psych negative neurological ROS  negative psych ROS   GI/Hepatic Neg liver ROS, Metastatic carcinoid   Endo/Other  negative endocrine ROS  Renal/GU Renal Insufficiency and ARFRenal disease (AKI on CKDIII)  negative genitourinary   Musculoskeletal negative musculoskeletal ROS (+)   Abdominal   Peds  Hematology negative hematology ROS (+)   Anesthesia Other Findings   Reproductive/Obstetrics                           Anesthesia Physical Anesthesia Plan  ASA: IV  Anesthesia Plan: MAC   Post-op Pain Management:    Induction: Intravenous  PONV Risk Score and Plan: 2 and Propofol infusion, TIVA and Treatment may vary due to age or medical condition  Airway Management Planned: Natural Airway, Nasal Cannula and Simple Face Mask  Additional Equipment: None  Intra-op Plan:   Post-operative Plan:   Informed Consent: I have reviewed the patients History and Physical, chart, labs and discussed the procedure including the risks, benefits and alternatives for the proposed anesthesia with the patient or authorized representative who has indicated his/her understanding and acceptance.     Dental advisory given  Plan Discussed with: CRNA and  Anesthesiologist  Anesthesia Plan Comments:       Anesthesia Quick Evaluation

## 2019-12-30 NOTE — Anesthesia Procedure Notes (Signed)
Procedure Name: MAC Date/Time: 12/30/2019 2:15 PM Performed by: Harden Mo, CRNA Pre-anesthesia Checklist: Patient identified, Emergency Drugs available, Suction available and Patient being monitored Patient Re-evaluated:Patient Re-evaluated prior to induction Oxygen Delivery Method: Nasal cannula Preoxygenation: Pre-oxygenation with 100% oxygen Induction Type: IV induction Placement Confirmation: positive ETCO2 and breath sounds checked- equal and bilateral Dental Injury: Teeth and Oropharynx as per pre-operative assessment

## 2019-12-30 NOTE — Interval H&P Note (Signed)
History and Physical Interval Note:  12/30/2019 2:06 PM  [name]  has presented today for surgery, with the diagnosis of TRICUSPID REGURGITATION.  The various methods of treatment have been discussed with the patient and family. After consideration of risks, benefits and other options for treatment, the patient has consented to  Procedure(s): TRANSESOPHAGEAL ECHOCARDIOGRAM (TEE) (N/A) as a surgical intervention.  The patient's history has been reviewed, patient examined, no change in status, stable for surgery.  I have reviewed the patient's chart and labs.  Questions were answered to the patient's satisfaction.     Amber Bautista

## 2019-12-30 NOTE — Progress Notes (Addendum)
Patient ID: Amber Bautista, female   DOB: 02-12-1971, 49 y.o.   MRN: [id number]     Advanced Heart Failure Rounding Note  PCP-Cardiologist: Shirlee More, MD   Subjective:    Milrinone discontinued 3/21. Co-ox marginal at 58%.    -2.5L out yesterday on torsemide. Wt down 2 lb. SCr bump from 2.94>>3.17.  Still complains of orthopnea/PND. CVP 16.   Brief run of ventricular bigeminy on tele this am. However overall PVC burden markedly reduced on amio. 1-2/hr. K 3.5.    RHC 12/26/19 (on milrinone 0.125):  RA = 11 RV = 43/5 PA = 19/3 (10) PCW = 3 Fick cardiac output/index = 5.22/2.5 Thermo CO/CI = 3.1/1.5 (liekly inaccurate due to severe TR) PVR = 1.3 WU Ao sat = 95% PA sat = 63%, 64%   Objective:   Weight Range: 100.9 kg Body mass index is 35.9 kg/m.   Vital Signs:   Temp:  [97.6 F (36.4 C)-98.7 F (37.1 C)] 97.6 F (36.4 C) (03/23 0909) Pulse Rate:  [79-95] 92 (03/23 0909) Resp:  [16-22] 22 (03/23 0909) BP: (114-133)/(73-107) 129/99 (03/23 0909) SpO2:  [94 %-100 %] 98 % (03/23 0909) Weight:  [100.9 kg] 100.9 kg (03/23 0422) Last BM Date: 12/30/19(Diarrhea)  Weight change: Filed Weights   12/28/19 0558 12/29/19 0400 12/30/19 0422  Weight: 101.7 kg 101.9 kg 100.9 kg    Intake/Output:   Intake/Output Summary (Last 24 hours) at 12/30/2019 1119 Last data filed at 12/30/2019 0913 Gross per 24 hour  Intake 1255.94 ml  Output 3050 ml  Net -1794.06 ml      Physical Exam   PHYSICAL EXAM: CVP 16  General:  Chronically ill appearing, obese female. No respiratory difficulty HEENT: normal anicteric  Neck: supple. elevated JVD to ear prominent v-waves. Carotids 2+ bilat; no bruits. No lymphadenopathy or thyromegaly appreciated. Cor: PMI nondisplaced. Regular rate & rhythm. 3/6 HSM LLSB Lungs: CTAB Abdomen: obese, soft, nontender, nondistended. No hepatosplenomegaly. No bruits or masses. Good bowel sounds. Extremities: no cyanosis, clubbing, rash, trace bilateral  LEE, R>L Neuro: alert & oriented x 3, cranial nerves grossly intact. moves all 4 extremities w/o difficulty. Affect pleasant   Telemetry   NSR 90s, brief run of ventricular bigeminy this am. PVCs overall less frequent, 1-2/hr Personally reviewed  EKG    N/a   Labs    CBC Recent Labs    12/28/19 0500  WBC 6.3  HGB 13.2  HCT 43.2  MCV 90.2  PLT 269   Basic Metabolic Panel Recent Labs    12/28/19 0500 12/28/19 0500 12/29/19 0500 12/30/19 0407  NA 137   < > 138 138  K 3.6   < > 3.4* 3.5  CL 98   < > 98 99  CO2 26   < > 26 27  GLUCOSE 122*   < > 114* 123*  BUN 41*   < > 41* 43*  CREATININE 3.14*   < > 2.94* 3.17*  CALCIUM 9.4   < > 9.1 9.0  MG 2.2  --  1.9  --    < > = values in this interval not displayed.   Liver Function Tests No results for input(s): AST, ALT, ALKPHOS, BILITOT, PROT, ALBUMIN in the last 72 hours. No results for input(s): LIPASE, AMYLASE in the last 72 hours. Cardiac Enzymes No results for input(s): CKTOTAL, CKMB, CKMBINDEX, TROPONINI in the last 72 hours.  BNP: BNP (last 3 results) Recent Labs    07/21/19 1334 10/13/19 1300 12/20/19 0700  BNP 69.8 301.6* 382.1*    ProBNP (last 3 results) Recent Labs    04/09/19 1458 05/20/19 1611 12/17/19 1532  PROBNP 469* 1,071* 5,909*     D-Dimer No results for input(s): DDIMER in the last 72 hours. Hemoglobin A1C No results for input(s): HGBA1C in the last 72 hours. Fasting Lipid Panel No results for input(s): CHOL, HDL, LDLCALC, TRIG, CHOLHDL, LDLDIRECT in the last 72 hours. Thyroid Function Tests No results for input(s): TSH, T4TOTAL, T3FREE, THYROIDAB in the last 72 hours.  Invalid input(s): FREET3  Other results:   Imaging    No results found.   Medications:     Scheduled Medications: . amiodarone  200 mg Oral Daily  . Chlorhexidine Gluconate Cloth  6 each Topical Daily  . enoxaparin (LOVENOX) injection  30 mg Subcutaneous Q24H  . pneumococcal 23 valent vaccine  0.5  mL Intramuscular Tomorrow-1000  . potassium chloride  40 mEq Oral BID  . ramelteon  8 mg Oral QHS  . sodium chloride flush  10-40 mL Intracatheter Q12H  . sodium chloride flush  3 mL Intravenous Q12H  . sodium chloride flush  3 mL Intravenous Q12H  . torsemide  20 mg Oral BID    Infusions: . sodium chloride    . sodium chloride 20 mL/hr at 12/30/19 0353    PRN Medications: sodium chloride, acetaminophen **OR** acetaminophen, acetaminophen, ondansetron (ZOFRAN) IV, sodium chloride flush, sodium chloride flush     Assessment/Plan   1. Acute systolic HF due to NICM  - Echo 2013 EF 25-30%. Cath normal cors - Echo 2020 EF 45-50% - Echo 12/19/19: EF 20% septal DK. RV ok TV restricted severe TR  - suspect LV dysfunction due to PVCs +/- LBBB. PVCs/bigeminy have come in bursts. Unclear if overall burden high enough to cause CM but possibly so (now improved w/ amiodarone) - RHC on 0.125 of milrinone showed RV failure in setting of tricuspid and pulmonic valve disease with low LV filling pressure.   - Milrinone discontinued 3/21. Co-ox marginal at 58% today.  - Hold b-blocker for low output  - Off Entresto/spiro due to AKI - remains volume up w/ orthopnea/PND. Will give dose of 80 IV Lasix after TEE this afternoon.  - TEE today to better assess Tricuspid and pulmonic valves. ? If candidate for TR clip.   - Prognosis seems quite poor with several major health issues including metastatic carcinoid syndrome. She has RV failure in the setting of carcinoid heart disease with TV and PV involvement, but she also has LV EF 20% that is likely NOT carcinoid-related, suspect due to LBBB CMP maybe worsened by PVCs.  CRT is a consideration, and PVCs have been suppressed well with amiodarone. Surgical treatment of the right-sided heart valves would be very high risk with co-existing LV failure and renal dysfunction.   2. Frequent PVCs. - Zio Patch performed 04/07/2019 showed frequent PVCs with a 7.4% burden   - Improved on amio. PVC rate 1-2/ hr. Continue 200 mg daily  - EP consulted. Consider LV lead after discharge by Dr Caryl Comes    3. Acute on CKD3 - creatinine 1.3 in 11/20  - Creatinine  2.7>2.6 >>3.03 > 3.0 > 3.1>2.94>>3.17.  - suspect due to low output/cardiorenal - Will give 80 IV Lasix x 1 today for volume overload - Repeat BMP in am.    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  This has  caused RV failure.   - As above, surgery would be high risk with renal dysfunction and LV dysfunction.   5. Metastatic carcinoid syndrome with liver mets - followed by Dr. Marin Olp - on somatuline  6. LBBB/IVCD - Would consider CRT if LVEF dose not improve w/ suppression of PVCs.   7. Hypokalemia/Hypomagnesemia - supp to keep K> 4.0, Mg > 2.0 - K 3.5. Getting KCl, 40 mEq bid .   Length of Stay: [address], PA-C  12/30/2019, 11:19 AM  Advanced Heart Failure Team Pager [phone] (M-F; [place])  Please contact [facility] for night-coverage after hours (4p -7a ) and weekends on [email or web address]   Patient seen and examined with the above-signed Advanced Practice Provider and/or Housestaff. I personally reviewed laboratory data, imaging studies and relevant notes. I independently examined the patient and formulated the important aspects of the plan. I have edited the note to reflect any of my changes or salient points. I have personally discussed the plan with the patient and/or family.  She continues to struggle. Orthopnea has recurred after milrinone stopped. Still with R-sided volume overload as well. Creatinine now climbing again in setting of cardiorenal syndrome. K low. PVCs suppressed on amio.   General:  Weap appearing. No resp difficulty HEENT: normal Neck: supple. JVP to ear with prominent CV waves. Carotids 2+ bilat; no bruits. No lymphadenopathy or thryomegaly appreciated. Cor: PMI  nondisplaced. Regular rate & rhythm. 2/6 TR Lungs: clear Abdomen: obese soft, nontender, nondistended. No hepatosplenomegaly. No bruits or masses. Good bowel sounds. Extremities: no cyanosis, clubbing, rash, tr- 1+ edema Neuro: alert & orientedx3, cranial nerves grossly intact. moves all 4 extremities w/o difficulty. Affect pleasant  She remains very tenuous. Volume status very hard to manage in setting of wide-open TR. LV dysfunction and cardiorenal syndrome.   Will plan TEE today to evaluate right-sided valves more closely. Give lasix 80 IV after TEE. I am not sure what more we will have to offer her but will await TEE results to see if she might be a potential candidate for TV Clip.   Glori Bickers, MD  2:05 PM

## 2019-12-30 NOTE — H&P (View-Only) (Signed)
Patient ID: Amber Bautista, female   DOB: October 11, 1970, 49 y.o.   MRN: [id number]     Advanced Heart Failure Rounding Note  PCP-Cardiologist: Shirlee More, MD   Subjective:    Milrinone discontinued 3/21. Co-ox marginal at 58%.    -2.5L out yesterday on torsemide. Wt down 2 lb. SCr bump from 2.94>>3.17.  Still complains of orthopnea/PND. CVP 16.   Brief run of ventricular bigeminy on tele this am. However overall PVC burden markedly reduced on amio. 1-2/hr. K 3.5.    RHC 12/26/19 (on milrinone 0.125):  RA = 11 RV = 43/5 PA = 19/3 (10) PCW = 3 Fick cardiac output/index = 5.22/2.5 Thermo CO/CI = 3.1/1.5 (liekly inaccurate due to severe TR) PVR = 1.3 WU Ao sat = 95% PA sat = 63%, 64%   Objective:   Weight Range: 100.9 kg Body mass index is 35.9 kg/m.   Vital Signs:   Temp:  [97.6 F (36.4 C)-98.7 F (37.1 C)] 97.6 F (36.4 C) (03/23 0909) Pulse Rate:  [79-95] 92 (03/23 0909) Resp:  [16-22] 22 (03/23 0909) BP: (114-133)/(73-107) 129/99 (03/23 0909) SpO2:  [94 %-100 %] 98 % (03/23 0909) Weight:  [100.9 kg] 100.9 kg (03/23 0422) Last BM Date: 12/30/19(Diarrhea)  Weight change: Filed Weights   12/28/19 0558 12/29/19 0400 12/30/19 0422  Weight: 101.7 kg 101.9 kg 100.9 kg    Intake/Output:   Intake/Output Summary (Last 24 hours) at 12/30/2019 1119 Last data filed at 12/30/2019 0913 Gross per 24 hour  Intake 1255.94 ml  Output 3050 ml  Net -1794.06 ml      Physical Exam   PHYSICAL EXAM: CVP 16  General:  Chronically ill appearing, obese female. No respiratory difficulty HEENT: normal anicteric  Neck: supple. elevated JVD to ear prominent v-waves. Carotids 2+ bilat; no bruits. No lymphadenopathy or thyromegaly appreciated. Cor: PMI nondisplaced. Regular rate & rhythm. 3/6 HSM LLSB Lungs: CTAB Abdomen: obese, soft, nontender, nondistended. No hepatosplenomegaly. No bruits or masses. Good bowel sounds. Extremities: no cyanosis, clubbing, rash, trace bilateral  LEE, R>L Neuro: alert & oriented x 3, cranial nerves grossly intact. moves all 4 extremities w/o difficulty. Affect pleasant   Telemetry   NSR 90s, brief run of ventricular bigeminy this am. PVCs overall less frequent, 1-2/hr Personally reviewed  EKG    N/a   Labs    CBC Recent Labs    12/28/19 0500  WBC 6.3  HGB 13.2  HCT 43.2  MCV 90.2  PLT 950   Basic Metabolic Panel Recent Labs    12/28/19 0500 12/28/19 0500 12/29/19 0500 12/30/19 0407  NA 137   < > 138 138  K 3.6   < > 3.4* 3.5  CL 98   < > 98 99  CO2 26   < > 26 27  GLUCOSE 122*   < > 114* 123*  BUN 41*   < > 41* 43*  CREATININE 3.14*   < > 2.94* 3.17*  CALCIUM 9.4   < > 9.1 9.0  MG 2.2  --  1.9  --    < > = values in this interval not displayed.   Liver Function Tests No results for input(s): AST, ALT, ALKPHOS, BILITOT, PROT, ALBUMIN in the last 72 hours. No results for input(s): LIPASE, AMYLASE in the last 72 hours. Cardiac Enzymes No results for input(s): CKTOTAL, CKMB, CKMBINDEX, TROPONINI in the last 72 hours.  BNP: BNP (last 3 results) Recent Labs    07/21/19 1334 10/13/19 1300 12/20/19 0700  BNP 69.8 301.6* 382.1*    ProBNP (last 3 results) Recent Labs    04/09/19 1458 05/20/19 1611 12/17/19 1532  PROBNP 469* 1,071* 5,909*     D-Dimer No results for input(s): DDIMER in the last 72 hours. Hemoglobin A1C No results for input(s): HGBA1C in the last 72 hours. Fasting Lipid Panel No results for input(s): CHOL, HDL, LDLCALC, TRIG, CHOLHDL, LDLDIRECT in the last 72 hours. Thyroid Function Tests No results for input(s): TSH, T4TOTAL, T3FREE, THYROIDAB in the last 72 hours.  Invalid input(s): FREET3  Other results:   Imaging    No results found.   Medications:     Scheduled Medications: . amiodarone  200 mg Oral Daily  . Chlorhexidine Gluconate Cloth  6 each Topical Daily  . enoxaparin (LOVENOX) injection  30 mg Subcutaneous Q24H  . pneumococcal 23 valent vaccine  0.5  mL Intramuscular Tomorrow-1000  . potassium chloride  40 mEq Oral BID  . ramelteon  8 mg Oral QHS  . sodium chloride flush  10-40 mL Intracatheter Q12H  . sodium chloride flush  3 mL Intravenous Q12H  . sodium chloride flush  3 mL Intravenous Q12H  . torsemide  20 mg Oral BID    Infusions: . sodium chloride    . sodium chloride 20 mL/hr at 12/30/19 0353    PRN Medications: sodium chloride, acetaminophen **OR** acetaminophen, acetaminophen, ondansetron (ZOFRAN) IV, sodium chloride flush, sodium chloride flush     Assessment/Plan   1. Acute systolic HF due to NICM  - Echo 2013 EF 25-30%. Cath normal cors - Echo 2020 EF 45-50% - Echo 12/19/19: EF 20% septal DK. RV ok TV restricted severe TR  - suspect LV dysfunction due to PVCs +/- LBBB. PVCs/bigeminy have come in bursts. Unclear if overall burden high enough to cause CM but possibly so (now improved w/ amiodarone) - RHC on 0.125 of milrinone showed RV failure in setting of tricuspid and pulmonic valve disease with low LV filling pressure.   - Milrinone discontinued 3/21. Co-ox marginal at 58% today.  - Hold b-blocker for low output  - Off Entresto/spiro due to AKI - remains volume up w/ orthopnea/PND. Will give dose of 80 IV Lasix after TEE this afternoon.  - TEE today to better assess Tricuspid and pulmonic valves. ? If candidate for TR clip.   - Prognosis seems quite poor with several major health issues including metastatic carcinoid syndrome. She has RV failure in the setting of carcinoid heart disease with TV and PV involvement, but she also has LV EF 20% that is likely NOT carcinoid-related, suspect due to LBBB CMP maybe worsened by PVCs.  CRT is a consideration, and PVCs have been suppressed well with amiodarone. Surgical treatment of the right-sided heart valves would be very high risk with co-existing LV failure and renal dysfunction.   2. Frequent PVCs. - Zio Patch performed 04/07/2019 showed frequent PVCs with a 7.4% burden   - Improved on amio. PVC rate 1-2/ hr. Continue 200 mg daily  - EP consulted. Consider LV lead after discharge by Dr Caryl Comes    3. Acute on CKD3 - creatinine 1.3 in 11/20  - Creatinine  2.7>2.6 >>3.03 > 3.0 > 3.1>2.94>>3.17.  - suspect due to low output/cardiorenal - Will give 80 IV Lasix x 1 today for volume overload - Repeat BMP in am.    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  This has  caused RV failure.   - As above, surgery would be high risk with renal dysfunction and LV dysfunction.   5. Metastatic carcinoid syndrome with liver mets - followed by Dr. Marin Olp - on somatuline  6. LBBB/IVCD - Would consider CRT if LVEF dose not improve w/ suppression of PVCs.   7. Hypokalemia/Hypomagnesemia - supp to keep K> 4.0, Mg > 2.0 - K 3.5. Getting KCl, 40 mEq bid .   Length of Stay: [address], PA-C  12/30/2019, 11:19 AM  Advanced Heart Failure Team Pager [phone] (M-F; [place])  Please contact [facility] for night-coverage after hours (4p -7a ) and weekends on [email or web address]   Patient seen and examined with the above-signed Advanced Practice Provider and/or Housestaff. I personally reviewed laboratory data, imaging studies and relevant notes. I independently examined the patient and formulated the important aspects of the plan. I have edited the note to reflect any of my changes or salient points. I have personally discussed the plan with the patient and/or family.  She continues to struggle. Orthopnea has recurred after milrinone stopped. Still with R-sided volume overload as well. Creatinine now climbing again in setting of cardiorenal syndrome. K low. PVCs suppressed on amio.   General:  Weap appearing. No resp difficulty HEENT: normal Neck: supple. JVP to ear with prominent CV waves. Carotids 2+ bilat; no bruits. No lymphadenopathy or thryomegaly appreciated. Cor: PMI  nondisplaced. Regular rate & rhythm. 2/6 TR Lungs: clear Abdomen: obese soft, nontender, nondistended. No hepatosplenomegaly. No bruits or masses. Good bowel sounds. Extremities: no cyanosis, clubbing, rash, tr- 1+ edema Neuro: alert & orientedx3, cranial nerves grossly intact. moves all 4 extremities w/o difficulty. Affect pleasant  She remains very tenuous. Volume status very hard to manage in setting of wide-open TR. LV dysfunction and cardiorenal syndrome.   Will plan TEE today to evaluate right-sided valves more closely. Give lasix 80 IV after TEE. I am not sure what more we will have to offer her but will await TEE results to see if she might be a potential candidate for TV Clip.   Glori Bickers, MD  2:05 PM

## 2019-12-30 NOTE — Progress Notes (Signed)
  Echocardiogram Echocardiogram Transesophageal has been performed.  Amber Bautista 12/30/2019, 3:14 PM

## 2019-12-30 NOTE — Transfer of Care (Signed)
Immediate Anesthesia Transfer of Care Note  Patient: NAVINA WOHLERS  Procedure(s) Performed: TRANSESOPHAGEAL ECHOCARDIOGRAM (TEE) (N/A )  Patient Location: Endoscopy Unit  Anesthesia Type:MAC  Level of Consciousness: awake and drowsy  Airway & Oxygen Therapy: Patient Spontanous Breathing and Patient connected to nasal cannula oxygen  Post-op Assessment: Report given to RN, Post -op Vital signs reviewed and stable and Patient moving all extremities X 4  Post vital signs: Reviewed and stable  Last Vitals:  Vitals Value Taken Time  BP    Temp    Pulse 76 12/30/19 1458  Resp 22 12/30/19 1458  SpO2 98 % 12/30/19 1458  Vitals shown include unvalidated device data.  Last Pain:  Vitals:   12/30/19 1308  TempSrc: Oral  PainSc: 0-No pain      Patients Stated Pain Goal: 0 (82/57/49 3552)  Complications: No apparent anesthesia complications

## 2019-12-30 NOTE — CV Procedure (Signed)
    TRANSESOPHAGEAL ECHOCARDIOGRAM   NAME:  Amber Bautista   MRN: [id number] DOB:  12-Feb-1971   ADMIT DATE: 12/20/2019  INDICATIONS:  Severe tricuspid regurgitation  PROCEDURE:   Informed consent was obtained prior to the procedure. The risks, benefits and alternatives for the procedure were discussed and the patient comprehended these risks.  Risks include, but are not limited to, cough, sore throat, vomiting, nausea, somnolence, esophageal and stomach trauma or perforation, bleeding, low blood pressure, aspiration, pneumonia, infection, trauma to the teeth and death.    After a procedural time-out, the patient was sedated by the anesthesia service with IV propofol.The transesophageal probe was inserted in the esophagus and stomach without difficulty and multiple views were obtained.    COMPLICATIONS:    There were no immediate complications.  FINDINGS:  LEFT VENTRICLE: Dilated/spherical. EF = 25%. Severe diastolic flattening of the septum.   RIGHT VENTRICLE: Normal size and function.   LEFT ATRIUM: Severely dilated  LEFT ATRIAL APPENDAGE: Not well visualized  RIGHT ATRIUM: Severely dialted  AORTIC VALVE:  Trileaflet. Mildly calcified. Triv AI   MITRAL VALVE:    Markedly abnormal. Thickened leaflets Trivial MR  TRICUSPID VALVE: Not well visualized. Thickened leaflets. Very severe TR suspect septal leaflet restricted   PULMONIC VALVE: Not well visualized. + turbulence c/w pulmonic stenosis. Moderate PI  INTERATRIAL SEPTUM: No PFO or ASD.  PERICARDIUM: Small effusion along RV  DESCENDING AORTA: Mild plaque.     [name],MD 2:54 PM

## 2019-12-30 NOTE — Anesthesia Postprocedure Evaluation (Signed)
Anesthesia Post Note  Patient: Amber Bautista  Procedure(s) Performed: TRANSESOPHAGEAL ECHOCARDIOGRAM (TEE) (N/A )     Patient location during evaluation: Endoscopy Anesthesia Type: MAC Level of consciousness: awake and alert Pain management: pain level controlled Vital Signs Assessment: post-procedure vital signs reviewed and stable Respiratory status: spontaneous breathing, nonlabored ventilation and respiratory function stable Cardiovascular status: blood pressure returned to baseline and stable Postop Assessment: no apparent nausea or vomiting Anesthetic complications: no    Last Vitals:  Vitals:   12/30/19 1308 12/30/19 1459  BP: (!) 142/112 104/65  Pulse: 86 75  Resp: 17 20  Temp: 36.7 C   SpO2: 99% 95%    Last Pain:  Vitals:   12/30/19 1459  TempSrc:   PainSc: 0-No pain                 Lidia Collum

## 2019-12-30 NOTE — Progress Notes (Signed)
Physical Therapy Cancellation Note   12/30/19 1405  PT Visit Information  Last PT Received On 12/30/19  Reason Eval/Treat Not Completed Patient at procedure or test/unavailable. Pt off unit for TEE. PT will continue to follow acutely.   Earney Navy, PTA Acute Rehabilitation Services Pager: [phone] Office: [phone]

## 2019-12-30 NOTE — Progress Notes (Signed)
   Subjective: Pt seen at the bedside this morning. Had trouble sleeping last evening due to orthopnea. Endorses continued good urine output. Pt aware of TEE procedure later this afternoon. No acute concerns at this time.  Objective:  Vital signs in last 24 hours: Vitals:   12/29/19 2027 12/30/19 0030 12/30/19 0422 12/30/19 0425  BP: (!) 133/106 (!) 124/99  (!) 125/107  Pulse: 84 88  95  Resp: 16 20  20   Temp: 98.6 F (37 C) 98.6 F (37 C)  98.7 F (37.1 C)  TempSrc: Oral Oral  Oral  SpO2: 99% 99%  94%  Weight:   100.9 kg    Physical Exam Vitals and nursing note reviewed.  Constitutional:      General: She is not in acute distress.    Appearance: She is not ill-appearing.     Comments: Pt appears tired, sitting up on edge of bed.  Pulmonary:     Effort: Pulmonary effort is normal. No respiratory distress.     Breath sounds: Normal breath sounds. No wheezing, rhonchi or rales.  Musculoskeletal:     Comments: +1 bilateral lower extremity pitting edema to mid shin  Skin:    General: Skin is warm and dry.  Neurological:     Mental Status: She is alert.    Assessment/Plan:  Active Problems:   Acute exacerbation of CHF (congestive heart failure) (HCC)   Acute on chronic HFrEF (heart failure with reduced ejection fraction) (HCC)   Acute renal failure superimposed on chronic kidney disease ([place])  Ms. Butch is a 49 year old F with significant PMH of metastatic carcinoid tumor, stage IIB ductal triple negative L breast cancer, HFrEF, nonischemic cardiomyopathy, and obesity, who presents with shortness of breath and a general unwell feelingconsistent with acute HFrEF exacerbation.   Acute on chronic heart failure exacerbationfrom NICM Frequent PVCs LBBB Work-up including echo and RHC consistent with HF due to severe TR and RHF secondary to progression of carcinoid valve disease. - diuresis with torsemide - co-ox 58 off milrinone - net negative 814cc (with 2.5L urine  output) - weight down 1kg - Cr bumped 2.9 > 3.1 and GFR 18 > 16 - though overall stable -replete Mg and K to keep Mg > 2 and K > 4  Advanced heart failure consulted, appreciate their input in this case - TEE today to get a better look at tricuspid and pulmonary valves - co-ox dropped and pt having orthopnea off milrinone, suggesting component of LV failure - continue diuresis on 20mg  PO torsemide BID - PVCs suppressed on amio - if LV function not improving, can consider CRT  EP consulted - PVCs better controlled on 200mg  amiodarone daily - prolonged QTc (480-490) stable on lower dose amiodarone - Dr Caryl Comes and Dr. Mahalia Longest discussing possible LV pacing  Prior to Admission Living Arrangement: home Anticipated Discharge Location: home Barriers to Discharge: TEE today Dispo: Anticipated discharge pending clinical improvement  Ladona Horns, MD 12/30/2019, 6:26 AM Pager: [phone]

## 2019-12-31 DIAGNOSIS — I4581 Long QT syndrome: Secondary | ICD-10-CM

## 2019-12-31 LAB — BASIC METABOLIC PANEL
Anion gap: 8 (ref 5–15)
Anion gap: 13 (ref 5–15)
BUN: 29 mg/dL — ABNORMAL HIGH (ref 6–20)
BUN: 37 mg/dL — ABNORMAL HIGH (ref 6–20)
CO2: 22 mmol/L (ref 22–32)
CO2: 25 mmol/L (ref 22–32)
Calcium: 6.6 mg/dL — ABNORMAL LOW (ref 8.9–10.3)
Calcium: 9 mg/dL (ref 8.9–10.3)
Chloride: 110 mmol/L (ref 98–111)
Chloride: 89 mmol/L — ABNORMAL LOW (ref 98–111)
Creatinine, Ser: 2.32 mg/dL — ABNORMAL HIGH (ref 0.44–1.00)
Creatinine, Ser: 3.04 mg/dL — ABNORMAL HIGH (ref 0.44–1.00)
GFR calc Af Amer: 28 mL/min — ABNORMAL LOW (ref >60)
GFR calc Af Amer: 20 mL/min — ABNORMAL LOW (ref >60)
GFR calc non Af Amer: 24 mL/min — ABNORMAL LOW (ref >60)
GFR calc non Af Amer: 17 mL/min — ABNORMAL LOW (ref >60)
Glucose, Bld: 90 mg/dL (ref 70–99)
Glucose, Bld: 181 mg/dL — ABNORMAL HIGH (ref 70–99)
Potassium: 2.2 mmol/L — CL (ref 3.5–5.1)
Potassium: 3.1 mmol/L — ABNORMAL LOW (ref 3.5–5.1)
Sodium: 140 mmol/L (ref 135–145)
Sodium: 127 mmol/L — ABNORMAL LOW (ref 135–145)

## 2019-12-31 LAB — HEPATIC FUNCTION PANEL
ALT: 11 U/L (ref 0–44)
AST: 13 U/L — ABNORMAL LOW (ref 15–41)
Albumin: 2.3 g/dL — ABNORMAL LOW (ref 3.5–5.0)
Alkaline Phosphatase: 62 U/L (ref 38–126)
Bilirubin, Direct: 0.3 mg/dL — ABNORMAL HIGH (ref 0.0–0.2)
Indirect Bilirubin: 0.7 mg/dL (ref 0.3–0.9)
Total Bilirubin: 1 mg/dL (ref 0.3–1.2)
Total Protein: 4.5 g/dL — ABNORMAL LOW (ref 6.5–8.1)

## 2019-12-31 LAB — COOXEMETRY PANEL
Carboxyhemoglobin: 1.1 % (ref 0.5–1.5)
Methemoglobin: 1 % (ref 0.0–1.5)
O2 Saturation: 54.9 %
Total hemoglobin: 12.9 g/dL (ref 12.0–16.0)

## 2019-12-31 LAB — MAGNESIUM: Magnesium: 1.3 mg/dL — ABNORMAL LOW (ref 1.7–2.4)

## 2019-12-31 MED ORDER — POTASSIUM CHLORIDE CRYS ER 20 MEQ PO TBCR
40.0000 meq | EXTENDED_RELEASE_TABLET | ORAL | Status: AC
  Administered 2019-12-31 (×3): 40 meq via ORAL
  Filled 2019-12-31 (×3): qty 2

## 2019-12-31 MED ORDER — POTASSIUM CHLORIDE 10 MEQ/100ML IV SOLN
10.0000 meq | INTRAVENOUS | Status: DC
  Administered 2019-12-31: 10 meq via INTRAVENOUS
  Filled 2019-12-31 (×2): qty 100

## 2019-12-31 MED ORDER — POTASSIUM CHLORIDE CRYS ER 20 MEQ PO TBCR
40.0000 meq | EXTENDED_RELEASE_TABLET | ORAL | Status: DC
  Administered 2019-12-31: 40 meq via ORAL
  Filled 2019-12-31 (×2): qty 2

## 2019-12-31 MED ORDER — MAGNESIUM SULFATE 4 GM/100ML IV SOLN
4.0000 g | Freq: Once | INTRAVENOUS | Status: AC
  Administered 2019-12-31: 4 g via INTRAVENOUS
  Filled 2019-12-31: qty 100

## 2019-12-31 MED ORDER — POTASSIUM CHLORIDE CRYS ER 20 MEQ PO TBCR
40.0000 meq | EXTENDED_RELEASE_TABLET | Freq: Once | ORAL | Status: AC
  Administered 2019-12-31: 40 meq via ORAL
  Filled 2019-12-31: qty 2

## 2019-12-31 NOTE — Progress Notes (Signed)
PT Cancellation Note  Patient Details Name: Amber Bautista MRN: [id number] DOB: 05/25/1971   Cancelled Treatment:    Reason Eval/Treat Not Completed: Other (comment) x 2 attempts to see pt; pt politely refusing due to fatigue.    Wyona Almas, PT, DPT Acute Rehabilitation Services Pager [phone] Office [phone]    Deno Etienne 12/31/2019, 3:35 PM

## 2019-12-31 NOTE — Progress Notes (Addendum)
2230- Pt had sudden SOB and stated felt "very weak". No pain. Vitals signs stable, no desat noted. O2 sat in 90's. Placed on 2L O2 for comfort. Pt still A&O, lungs clear and diminished.  Provider notified, informed to continue monitoring her.    2330- Pt stated she "feels calmer now".  Will monitor.

## 2019-12-31 NOTE — Progress Notes (Signed)
CCMD called to mention patient's QTC is 620. She has been in the 600's for 2-3 hours. Spoke to a personnel from the after hours heart failure clinic to relay the message to have the after hours MD speak to our oncoming nurse. Will update the oncoming nurse. Patient is not in any distress, no complaints with any pain and is alert and oriented x 4. Will continue to monitor the patient.

## 2019-12-31 NOTE — Progress Notes (Addendum)
Patient ID: Amber Bautista, female   DOB: August 30, 1971, 49 y.o.   MRN: [id number]     Advanced Heart Failure Rounding Note  PCP-Cardiologist: Shirlee More, MD   Subjective:    Milrinone discontinued 3/21. Co-ox marginal at 55%.    TEE 3/23 showed severe TR. RV normal size and function. LVEF 25%. Pulmonic valve not well visualized. + turbulence c/w pulmonic stenosis. Moderate PI  2L in UOP yesterday. Wt down 2 lb. CVP remains elevated at 16-17 (Severe TR likely contributing).  SCr up 2.32>>3.04.   K 3.1 Na 127   PVCs well suppressed w/ amiodarone.   Sleep better last night. No orthopnea/PND. No resting dyspnea.   We discussed possible transfer to [facility] for further management of her severe TR. She is willing to transfer if accepted.   ________________________ RHC 12/26/19 (on milrinone 0.125):  RA = 11 RV = 43/5 PA = 19/3 (10) PCW = 3 Fick cardiac output/index = 5.22/2.5 Thermo CO/CI = 3.1/1.5 (liekly inaccurate due to severe TR) PVR = 1.3 WU Ao sat = 95% PA sat = 63%, 64%   Objective:   Weight Range: 99.7 kg Body mass index is 35.49 kg/m.   Vital Signs:   Temp:  [97.5 F (36.4 C)-98.6 F (37 C)] 97.5 F (36.4 C) (03/24 0822) Pulse Rate:  [75-86] 81 (03/24 0601) Resp:  [11-20] 20 (03/24 0601) BP: (104-145)/(65-112) 116/91 (03/24 0601) SpO2:  [95 %-99 %] 99 % (03/24 0601) Weight:  [99.7 kg-100.7 kg] 99.7 kg (03/24 0601) Last BM Date: 12/30/19  Weight change: Filed Weights   12/30/19 0422 12/30/19 1308 12/31/19 0601  Weight: 100.9 kg 100.7 kg 99.7 kg    Intake/Output:   Intake/Output Summary (Last 24 hours) at 12/31/2019 1213 Last data filed at 12/31/2019 7342 Gross per 24 hour  Intake 460 ml  Output 1500 ml  Net -1040 ml      Physical Exam   PHYSICAL EXAM: CVP 16-17 General:  Chronically ill appearing, obese female sitting up in bed. No respiratory difficulty HEENT: normal anicteric Neck: supple. elevated JVD to ear prominent v-waves. Carotids 2+  bilat; no bruits. No lymphadenopathy or thyromegaly appreciated. Cor: PMI nondisplaced. Regular rate & rhythm. 3/6 HSM LLSB Lungs: CTAB no wheeze Abdomen: obese, soft, nontender, nondistended. No hepatosplenomegaly. No bruits or masses. Good bowel sounds. Extremities: no cyanosis, clubbing, rash, trace-1+ bilateral LEE, Neuro: alert & oriented x 3, cranial nerves grossly intact. moves all 4 extremities w/o difficulty. Affect pleasant   Telemetry   NSR 90s. Rare PVCs  < 5/min consistently Personally reviewed   EKG    N/a   Labs    CBC No results for input(s): WBC, NEUTROABS, HGB, HCT, MCV, PLT in the last 72 hours. Basic Metabolic Panel Recent Labs    12/29/19 0500 12/30/19 0407 12/31/19 0429 12/31/19 1037  NA 138   < > 140 127*  K 3.4*   < > 2.2* 3.1*  CL 98   < > 110 89*  CO2 26   < > 22 25  GLUCOSE 114*   < > 90 181*  BUN 41*   < > 29* 37*  CREATININE 2.94*   < > 2.32* 3.04*  CALCIUM 9.1   < > 6.6* 9.0  MG 1.9  --  1.3*  --    < > = values in this interval not displayed.   Liver Function Tests Recent Labs    12/31/19 0752  AST 13*  ALT 11  ALKPHOS 62  BILITOT  1.0  PROT 4.5*  ALBUMIN 2.3*   No results for input(s): LIPASE, AMYLASE in the last 72 hours. Cardiac Enzymes No results for input(s): CKTOTAL, CKMB, CKMBINDEX, TROPONINI in the last 72 hours.  BNP: BNP (last 3 results) Recent Labs    07/21/19 1334 10/13/19 1300 12/20/19 0700  BNP 69.8 301.6* 382.1*    ProBNP (last 3 results) Recent Labs    04/09/19 1458 05/20/19 1611 12/17/19 1532  PROBNP 469* 1,071* 5,909*     D-Dimer No results for input(s): DDIMER in the last 72 hours. Hemoglobin A1C No results for input(s): HGBA1C in the last 72 hours. Fasting Lipid Panel No results for input(s): CHOL, HDL, LDLCALC, TRIG, CHOLHDL, LDLDIRECT in the last 72 hours. Thyroid Function Tests No results for input(s): TSH, T4TOTAL, T3FREE, THYROIDAB in the last 72 hours.  Invalid input(s):  FREET3  Other results:   Imaging    No results found.   Medications:     Scheduled Medications: . amiodarone  200 mg Oral Daily  . Chlorhexidine Gluconate Cloth  6 each Topical Daily  . enoxaparin (LOVENOX) injection  30 mg Subcutaneous Q24H  . pneumococcal 23 valent vaccine  0.5 mL Intramuscular Tomorrow-1000  . potassium chloride  40 mEq Oral Q4H  . ramelteon  8 mg Oral QHS  . sodium chloride flush  10-40 mL Intracatheter Q12H  . sodium chloride flush  3 mL Intravenous Q12H  . sodium chloride flush  3 mL Intravenous Q12H  . torsemide  20 mg Oral BID    Infusions: . sodium chloride      PRN Medications: sodium chloride, acetaminophen **OR** acetaminophen, acetaminophen, menthol-cetylpyridinium, ondansetron (ZOFRAN) IV, sodium chloride flush, sodium chloride flush     Assessment/Plan   1. Acute systolic HF due to NICM  - Echo 2013 EF 25-30%. Cath normal cors - Echo 2020 EF 45-50% - Echo 12/19/19: EF 20% septal DK. RV ok TV restricted severe TR  - suspect LV dysfunction due to PVCs +/- LBBB. PVCs/bigeminy have come in bursts. Unclear if overall burden high enough to cause CM but possibly so (now improved w/ amiodarone) - RHC on 0.125 of milrinone showed RV failure in setting of tricuspid and pulmonic valve disease with low LV filling pressure.   - Milrinone discontinued 3/21. Co-ox marginal at 55% today.  - Hold b-blocker for low output  - Off Entresto/spiro due to AKI - No recurrent orthopnea/PND last night after dose of IV Lasix. SCr back up from 2.32>>3.04. CVP remains elevated ~16 but likely due to severe TR - Continue torsemide 20 mg bid. Follow BMP. Add TEE hoses.  - TEE 3/23 showed severe TR. Plan possible transfer to [facility] for further management. ? TV clip.   - Prognosis seems quite poor with several major health issues including metastatic carcinoid syndrome. She has RV failure in the setting of carcinoid heart disease with TV and PV involvement, but she also  has LV EF 20% that is likely NOT carcinoid-related, suspect due to LBBB CMP maybe worsened by PVCs.  CRT is a consideration, and PVCs have been suppressed well with amiodarone. Surgical treatment of the right-sided heart valves would be very high risk with co-existing LV failure and renal dysfunction.   2. Frequent PVCs. - Zio Patch performed 04/07/2019 showed frequent PVCs with a 7.4% burden  - PVCs now well suppressed w/ AAD therapy. Continue amiodarone 200 mg daily  - EP consulted. Consider LV lead after discharge by Dr Caryl Comes    3. Acute on CKD3 -  creatinine 1.3 in 11/20  - Creatinine  2.7>2.6 >>3.03 > 3.0 > 3.1>2.94>>3.17>>2.32>>3.04 - suspect due to low output/cardiorenal - continue PO torsemide 20 mg bid. 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  This has caused RV failure.   -TEE 3/23 confirmed severe TR - As above, surgery would be high risk with renal dysfunction and LV dysfunction.  - Plan possible transfer to [facility] for further management   5. Metastatic carcinoid syndrome with liver mets - followed by Dr. Marin Olp - on somatuline  6. LBBB/IVCD - Would consider CRT if LVEF dose not improve w/ suppression of PVCs.   7. Hypokalemia/Hypomagnesemia - supp to keep K> 4.0, Mg > 2.0 - K 3.1. Supp K ordered    Length of Stay: [address], PA-C  12/31/2019, 12:13 PM  Advanced Heart Failure Team Pager [phone] (M-F; [place])  Please contact [facility] for night-coverage after hours (4p -7a ) and weekends on [email or web address]  Patient seen and examined with the above-signed Advanced Practice Provider and/or Housestaff. I personally reviewed laboratory data, imaging studies and relevant notes. I independently examined the patient and formulated the important aspects of the plan. I have edited the note to reflect any of my changes or salient points. I have personally  discussed the plan with the patient and/or family.  She remains very tenuous. Results of TEE reviewed with her in detail.   I also discussed case in detail with the [facility] team and they agree that she is not a candidate for surgical or percutaneous TV replacement and that they would have nothing more to offer her in the setting of advanced carcinoid heart disease.   Will do our best to optimize her volume status. Can use palliative milrinone as needed.  Supp K.   Likely home in next few days.   Total time spent 55 minutes. Over half that time spent discussing above.   Glori Bickers, MD  5:36 PM

## 2019-12-31 NOTE — Plan of Care (Signed)
  Problem: Education: Goal: Knowledge of General Education information will improve Description: Including pain rating scale, medication(s)/side effects and non-pharmacologic comfort measures Outcome: Progressing   Problem: Health Behavior/Discharge Planning: Goal: Ability to manage health-related needs will improve Outcome: Progressing   Problem: Clinical Measurements: Goal: Ability to maintain clinical measurements within normal limits will improve Outcome: Progressing Goal: Will remain free from infection Outcome: Progressing Goal: Diagnostic test results will improve Outcome: Progressing   Problem: Nutrition: Goal: Adequate nutrition will be maintained Outcome: Progressing   Problem: Education: Goal: Ability to demonstrate management of disease process will improve Outcome: Progressing Goal: Ability to verbalize understanding of medication therapies will improve Outcome: Progressing Goal: Individualized Educational Video(s) Outcome: Progressing   Problem: Cardiac: Goal: Ability to achieve and maintain adequate cardiopulmonary perfusion will improve Outcome: Progressing

## 2019-12-31 NOTE — Progress Notes (Signed)
CRITICAL VALUE ALERT  Critical Value:  Potasium 2.2  Date & Time Notied:  12/31/19 0550  Provider Notified: Dr Pleas Koch  Orders Received/Actions taken: Placing orders to replete

## 2019-12-31 NOTE — Progress Notes (Addendum)
Electrophysiology Rounding Note  Patient Name: Amber Bautista Date of Encounter: 12/31/2019  Primary Cardiologist: Shirlee More, MD   Subjective   No new complaints this am. Still having intermittent SOB  Inpatient Medications    Scheduled Meds: . amiodarone  200 mg Oral Daily  . Chlorhexidine Gluconate Cloth  6 each Topical Daily  . enoxaparin (LOVENOX) injection  30 mg Subcutaneous Q24H  . pneumococcal 23 valent vaccine  0.5 mL Intramuscular Tomorrow-1000  . potassium chloride  40 mEq Oral Q4H  . ramelteon  8 mg Oral QHS  . sodium chloride flush  10-40 mL Intracatheter Q12H  . sodium chloride flush  3 mL Intravenous Q12H  . sodium chloride flush  3 mL Intravenous Q12H  . torsemide  20 mg Oral BID   Continuous Infusions: . sodium chloride     PRN Meds: sodium chloride, acetaminophen **OR** acetaminophen, acetaminophen, menthol-cetylpyridinium, ondansetron (ZOFRAN) IV, sodium chloride flush, sodium chloride flush   Vital Signs    Vitals:   12/30/19 2100 12/31/19 0601 12/31/19 0822 12/31/19 1653  BP:  (!) 116/91    Pulse:  81    Resp:  20    Temp:  97.9 F (36.6 C) (!) 97.5 F (36.4 C) (!) 97.4 F (36.3 C)  TempSrc:    Oral  SpO2: 96% 99%    Weight:  99.7 kg    Height:        Intake/Output Summary (Last 24 hours) at 12/31/2019 1922 Last data filed at 12/31/2019 1710 Gross per 24 hour  Intake 544.52 ml  Output 1700 ml  Net -1155.48 ml   Filed Weights   12/30/19 0422 12/30/19 1308 12/31/19 0601  Weight: 100.9 kg 100.7 kg 99.7 kg    Physical Exam    GEN- The patient is well appearing, alert and oriented x 3 today.   Head- normocephalic, atraumatic Eyes-  Sclera clear, conjunctiva pink Ears- hearing intact Oropharynx- clear Neck- supple Lungs- Clear to ausculation bilaterally, normal work of breathing Heart- Regular rate and rhythm, no murmurs, rubs or gallops GI- soft, NT, ND, + BS Extremities- no clubbing, cyanosis, or edema Skin- no rash or  lesion Psych- euthymic mood, full affect Neuro- strength and sensation are intact  Labs    CBC No results for input(s): WBC, NEUTROABS, HGB, HCT, MCV, PLT in the last 72 hours. Basic Metabolic Panel Recent Labs    12/29/19 0500 12/30/19 0407 12/31/19 0429 12/31/19 1037  NA 138   < > 140 127*  K 3.4*   < > 2.2* 3.1*  CL 98   < > 110 89*  CO2 26   < > 22 25  GLUCOSE 114*   < > 90 181*  BUN 41*   < > 29* 37*  CREATININE 2.94*   < > 2.32* 3.04*  CALCIUM 9.1   < > 6.6* 9.0  MG 1.9  --  1.3*  --    < > = values in this interval not displayed.   Liver Function Tests Recent Labs    12/31/19 0752  AST 13*  ALT 11  ALKPHOS 62  BILITOT 1.0  PROT 4.5*  ALBUMIN 2.3*   No results for input(s): LIPASE, AMYLASE in the last 72 hours. Cardiac Enzymes No results for input(s): CKTOTAL, CKMB, CKMBINDEX, TROPONINI in the last 72 hours.   Telemetry    NSR with LBBB, 80-90s, few PVCs (personally reviewed)  Radiology    No results found.  Patient Profile     Amber  M Cruzis a 49 y.o.femalewith a past medical history significant for Chronic systolic CHF and frequent PVCs.She was admitted forSOB and sense of impending doom.  Assessment & Plan    1. PVCs Burden improved on amiodarone.  2. Prolonged QTc QTc has been read as long on tele, but has been relatively stable around 480-490 ms for being on amio when corrected for QRS on ECG  3. LBBB Dr. Caryl Comes and Dr. Haroldine Laws discussing possibility, benefit of LV pacing, but uncertain in setting of renal failure and carcinoid heart disease.   4. Acute on chronic systolic CHF  TEE 03/20/63 with EF 25%, Very severe TR suspect septal leaflet restricted.  5. AKI on CKD III Creatinine down to 2.32 but potassium exceedingly low. ? Accuracy. Recheck pending.  6. Metastatic carcinoid syndrome with liver mets Very limiting Followed by Dr. Marin Olp On Somatuline   For questions or updates, please contact [facility] Please consult  [email or web address] for contact info under Cardiology/STEMI.  Signed, Shirley Friar, PA-C  12/31/2019   As above   freviewed with Dr DB   Not making good progress   The role of CRT remains possible, but she needs to be better for [place] to consider  Have again reviewed ivabradine

## 2019-12-31 NOTE — Progress Notes (Signed)
   Subjective: Pt seen at the bedside this morning. States she is feeling fine and tolerated the TEE well yesterday. No acute concerns at this time. Endorsing continued urine output and weight loss.   Objective:  Vital signs in last 24 hours: Vitals:   12/30/19 1948 12/30/19 2000 12/30/19 2100 12/31/19 0601  BP: (!) 131/106 112/84  (!) 116/91  Pulse: 84 84  81  Resp: 15 11  20   Temp: 98.6 F (37 C)   97.9 F (36.6 C)  TempSrc: Oral     SpO2: 99% 97% 96% 99%  Weight:    99.7 kg  Height:       Physical Exam Vitals and nursing note reviewed.  Constitutional:      General: She is not in acute distress.    Appearance: She is not ill-appearing.  Cardiovascular:     Rate and Rhythm: Normal rate and regular rhythm.     Heart sounds: Murmur present.  Pulmonary:     Effort: Pulmonary effort is normal.  Musculoskeletal:     Comments: +1 bilateral lower extremity pitting edema to the mid-shin  Skin:    General: Skin is warm and dry.  Neurological:     Mental Status: She is alert.    Assessment/Plan:  Active Problems:   Acute exacerbation of CHF (congestive heart failure) (HCC)   Acute on chronic HFrEF (heart failure with reduced ejection fraction) (HCC)   Acute renal failure superimposed on chronic kidney disease ([place])  Ms. Boruff is a 49 year old F with significant PMH of metastatic carcinoid tumor, stage IIB ductal triple negative L breast cancer, HFrEF, nonischemic cardiomyopathy, and obesity, who presents with shortness of breath and a general unwell feelingconsistent with acute HFrEF exacerbation.   Acute on chronic heart failure exacerbationfrom NICM Frequent PVCs LBBB Work-up including TTE, RHC, and TEE consistent with biventricular HF due to severe TR and RHF secondary to progression of carcinoid valve disease, in addition to LV failure with EF of 25% related to LBBB and worsened by PVCs. - continue diuresis with torsemide 20mg  BID - co-ox 55 this morning - received  extra dose of 80mg  IV lasix yesterday with urine output of 2L - net negative 1.6L and weight down an additional 1 kg - kidney function stable Cr 2.9 > 3.1 > 2.3 > 3.0 (question accuracy of AM labs today where Cr was 2.32) - K low this morning at 2.2 and repeat 3.1 - will replete with PO K 68mEq x 4 today -replete Mg and K to keep Mg > 2 and K > 4  Advanced heart failure consulted, appreciate their input in this case - TEE on 3/23 with severe TR, normal RV size and function, LVEF 25%, PV not well visualized but + turbulence consistent with pulmonic stenosis and moderate pulmonary insufficiency  - possible transfer to [facility] for further management of severe TR - continue torsemide 20mg  BID  - daily BMP - PVCs well suppressed with amiodarone 200mg  daily - consider CRT if LVEF not improving with PVC suppression  EP consulted - PVCs better controlled on 200mg  amiodarone daily - prolonged QTc (480-490) stable on lower dose amiodarone - Dr Caryl Comes and Dr. Mahalia Longest discussing possible LV pacing  Prior to Admission Living Arrangement: home Anticipated Discharge Location: home vs [facility] Barriers to Discharge: clinical optimization Dispo: Anticipated discharge in approximately 2-3 day(s).   Ladona Horns, MD 12/31/2019, 8:00 AM Pager: [phone]

## 2020-01-01 LAB — BASIC METABOLIC PANEL
Anion gap: 14 (ref 5–15)
BUN: 39 mg/dL — ABNORMAL HIGH (ref 6–20)
CO2: 28 mmol/L (ref 22–32)
Calcium: 9.4 mg/dL (ref 8.9–10.3)
Chloride: 96 mmol/L — ABNORMAL LOW (ref 98–111)
Creatinine, Ser: 3.36 mg/dL — ABNORMAL HIGH (ref 0.44–1.00)
GFR calc Af Amer: 18 mL/min — ABNORMAL LOW (ref >60)
GFR calc non Af Amer: 15 mL/min — ABNORMAL LOW (ref >60)
Glucose, Bld: 122 mg/dL — ABNORMAL HIGH (ref 70–99)
Potassium: 3.7 mmol/L (ref 3.5–5.1)
Sodium: 138 mmol/L (ref 135–145)

## 2020-01-01 LAB — CALCIUM, IONIZED: Calcium, Ionized, Serum: 4.9 mg/dL (ref 4.5–5.6)

## 2020-01-01 LAB — MAGNESIUM: Magnesium: 2.6 mg/dL — ABNORMAL HIGH (ref 1.7–2.4)

## 2020-01-01 LAB — COOXEMETRY PANEL
Carboxyhemoglobin: 1 % (ref 0.5–1.5)
Methemoglobin: 0.9 % (ref 0.0–1.5)
O2 Saturation: 57.9 %
Total hemoglobin: 13.6 g/dL (ref 12.0–16.0)

## 2020-01-01 MED ORDER — POTASSIUM CHLORIDE CRYS ER 20 MEQ PO TBCR
20.0000 meq | EXTENDED_RELEASE_TABLET | Freq: Once | ORAL | Status: AC
  Administered 2020-01-01: 20 meq via ORAL
  Filled 2020-01-01: qty 1

## 2020-01-01 MED ORDER — FUROSEMIDE 10 MG/ML IJ SOLN
80.0000 mg | Freq: Once | INTRAMUSCULAR | Status: AC
  Administered 2020-01-01: 80 mg via INTRAVENOUS
  Filled 2020-01-01: qty 8

## 2020-01-01 NOTE — Progress Notes (Signed)
   Subjective: Pt seen at the bedside this morning. Feeling alright. Does endorse an episode of orthopnea last evening. States she awake feeling like she was choking/drowning and required some supplemental oxygen for comfort.   Objective:  Vital signs in last 24 hours: Vitals:   12/31/19 2230 01/01/20 0027 01/01/20 0455 01/01/20 0458  BP: 115/89 109/83 (!) 115/96   Pulse: 85 84 81   Resp: (!) 22  14   Temp:  98.3 F (36.8 C) 98.6 F (37 C)   TempSrc:  Oral Oral   SpO2: 100% 100% 100%   Weight:    99.4 kg  Height:       Physical Exam Vitals and nursing note reviewed.  Constitutional:      General: She is not in acute distress.    Comments: Pt sitting up at the edge of bed.  Cardiovascular:     Rate and Rhythm: Normal rate and regular rhythm.     Heart sounds: Murmur present.  Pulmonary:     Effort: Pulmonary effort is normal.  Musculoskeletal:     Comments: Bilateral LE pitting edema to the knees.  Neurological:     Mental Status: She is alert.    Assessment/Plan:  Active Problems:   Acute exacerbation of CHF (congestive heart failure) (HCC)   Acute on chronic HFrEF (heart failure with reduced ejection fraction) (HCC)   Acute renal failure superimposed on chronic kidney disease ([place])  Ms. Sciortino is a 49 year old F with significant PMH of metastatic carcinoid tumor, stage IIB ductal triple negative L breast cancer, HFrEF, nonischemic cardiomyopathy, and obesity, who presents with shortness of breath and a general unwell feelingconsistent with acute HFrEF exacerbation.  End-stage CHF Acute on chronic heart failure exacerbationfrom NICM Frequent PVCs LBBB Work-up including TTE, RHC, and TEE consistent with biventricular HF due to severe TR and RHF secondary to progression of carcinoid valve disease, in addition to LV failure with EF of 25% related to LBBB and worsened by PVCs. - net +200cc, and wt down 0.3kg after torsemide 20mg  BID - Cr 3.04 > 3.36 - K 3.7 and Mg  2.6 -replete Mg and K to keep Mg > 2 and K > 4  Advanced heart failure consulted, appreciate their input in this case - co-ox marginal at 85% today - diuretics increased yesterday, but Cr worsening - case discussed with [facility] team who did not see any additional treatment options - pt aware she is near the end due to her heart failure, at peace with it - DNR/DNI - wean milrinone and optimize volume status before getting pt home - continue amiodarone 200mg  daily  Palliative care consulted, appreciate their input and recommendations  Prior to Admission Living Arrangement: home Anticipated Discharge Location: home Barriers to Discharge: palliative consultation and optimization of volume status Dispo: Anticipated discharge in approximately 2-3 day(s).   Ladona Horns, MD 01/01/2020, 6:24 AM Pager: [phone]

## 2020-01-01 NOTE — Progress Notes (Signed)
Physical Therapy Treatment Patient Details Name: Amber Bautista MRN: [id number] DOB: 05/31/1971 Today's Date: 01/01/2020    History of Present Illness Pt is a 49 yo female presenting with SOB, dx with CHF exacerbation upon arrival to ED. PMH includes: HFrEF, nonischemic cardiomyopathy, obesity, hypertension, LBBB, frequent PVCs followed by EP, stage IIB ductal left breast carcinoma, and metastatic liver carcinoid.    PT Comments    Patient in bed upon arrival and eager to mobilize. Pt tolerated gait distance of 300 ft with seated rest break due to fatigue. PT will continue to follow acutely.       Follow Up Recommendations  No PT follow up;Supervision/Assistance - 24 hour     Equipment Recommendations  None recommended by PT (pt asking about hospital bed for home as she reports increased difficulty with breathing in supine)   Recommendations for Other Services       Precautions / Restrictions Precautions Precautions: Fall Precaution Comments: pt reports some episodes of dizziness after having BM, resulting in her passing out, no other reports of fall. orthostatic (-) Restrictions Weight Bearing Restrictions: No    Mobility  Bed Mobility Overal bed mobility: Independent                Transfers Overall transfer level: Modified independent Equipment used: None                Ambulation/Gait Ambulation/Gait assistance: Modified independent (Device/Increase time) Gait Distance (Feet): (~300 ft total with seated rest break) Assistive device: IV Pole Gait Pattern/deviations: Step-through pattern;Decreased stride length;Trunk flexed Gait velocity: decreased   General Gait Details: pt with slow, steady gait; pushed IV pole more for energy conservation than balance; seated break due to fatigue    Stairs             Wheelchair Mobility    Modified Rankin (Stroke Patients Only)       Balance Overall balance assessment: Needs  assistance Sitting-balance support: No upper extremity supported;Feet supported Sitting balance-Leahy Scale: Good       Standing balance-Leahy Scale: Fair                              Cognition Arousal/Alertness: Awake/alert Behavior During Therapy: WFL for tasks assessed/performed Overall Cognitive Status: Within Functional Limits for tasks assessed                                        Exercises      General Comments        Pertinent Vitals/Pain Pain Assessment: No/denies pain    Home Living                      Prior Function            PT Goals (current goals can now be found in the care plan section) Progress towards PT goals: Progressing toward goals    Frequency    Min 3X/week      PT Plan Current plan remains appropriate    Co-evaluation              AM-PAC PT "6 Clicks" Mobility   Outcome Measure  Help needed turning from your back to your side while in a flat bed without using bedrails?: None Help needed moving from lying on your back to sitting on the side  of a flat bed without using bedrails?: None Help needed moving to and from a bed to a chair (including a wheelchair)?: None Help needed standing up from a chair using your arms (e.g., wheelchair or bedside chair)?: None Help needed to walk in hospital room?: None Help needed climbing 3-5 steps with a railing? : A Little 6 Click Score: 23    End of Session   Activity Tolerance: Patient tolerated treatment well Patient left: in bed;with call bell/phone within reach;Other (comment)(pt sitting EOB) Nurse Communication: Mobility status PT Visit Diagnosis: Unsteadiness on feet (R26.81);Difficulty in walking, not elsewhere classified (R26.2)     Time: 7530-0511 PT Time Calculation (min) (ACUTE ONLY): 19 min  Charges:  $Gait Training: 8-22 mins                     Earney Navy, PTA Acute Rehabilitation Services Pager: [phone] Office:  [phone]     Darliss Cheney 01/01/2020, 5:34 PM

## 2020-01-01 NOTE — Progress Notes (Signed)
Talked with PA [place] because pt has order for Lasix IV and also 2 hrs later Demadex. Per PA Demadex will be d/c give Lasix.

## 2020-01-01 NOTE — Progress Notes (Addendum)
Patient ID: Amber Bautista, female   DOB: 1971-04-24, 49 y.o.   MRN: [id number]     Advanced Heart Failure Rounding Note  PCP-Cardiologist: Shirlee More, MD   Subjective:    Milrinone discontinued 3/21. Co-ox marginal at 55%.    TEE 3/23 showed severe TR. RV normal size and function. LVEF 25%. Pulmonic valve not well visualized. + turbulence c/w pulmonic stenosis. Moderate PI  Back on diuretics. Breathing better but creatinine worse. CVP remains 18  I discussed case with [facility] team yesterday and felt not to have any other options.   ________________________ RHC 12/26/19 (on milrinone 0.125):  RA = 11 RV = 43/5 PA = 19/3 (10) PCW = 3 Fick cardiac output/index = 5.22/2.5 Thermo CO/CI = 3.1/1.5 (liekly inaccurate due to severe TR) PVR = 1.3 WU Ao sat = 95% PA sat = 63%, 64%   Objective:   Weight Range: 99.4 kg Body mass index is 35.36 kg/m.   Vital Signs:   Temp:  [97.4 F (36.3 C)-98.6 F (37 C)] 97.6 F (36.4 C) (03/25 1150) Pulse Rate:  [81-89] 89 (03/25 1150) Resp:  [14-22] 18 (03/25 1150) BP: (109-139)/(82-99) 129/99 (03/25 1150) SpO2:  [94 %-100 %] 96 % (03/25 1150) Weight:  [99.4 kg] 99.4 kg (03/25 0458) Last BM Date: 12/31/19  Weight change: Filed Weights   12/30/19 1308 12/31/19 0601 01/01/20 0458  Weight: 100.7 kg 99.7 kg 99.4 kg    Intake/Output:   Intake/Output Summary (Last 24 hours) at 01/01/2020 1218 Last data filed at 01/01/2020 1147 Gross per 24 hour  Intake 1264.52 ml  Output 1300 ml  Net -35.48 ml      Physical Exam   PHYSICAL EXAM: CVP 18 General:  Chronically ill appearing, obese female sitting up in bed. No respiratory difficulty HEENT: normal Neck: supple. JVP to jaw with prominent CV waves. Carotids 2+ bilat; no bruits. No lymphadenopathy or thryomegaly appreciated. Cor: PMI nondisplaced. Regular rate & rhythm. 2/6 TR Lungs: clear Abdomen: obese soft, nontender, nondistended. No hepatosplenomegaly. No bruits or masses. Good  bowel sounds. Extremities: no cyanosis, clubbing, rash, 1+ edema Neuro: alert & orientedx3, cranial nerves grossly intact. moves all 4 extremities w/o difficulty. Affect pleasant   Telemetry   NSR 80-90s. Rare PVCs  < 5/min consistently Personally reviewed   Labs    CBC No results for input(s): WBC, NEUTROABS, HGB, HCT, MCV, PLT in the last 72 hours. Basic Metabolic Panel Recent Labs    12/31/19 0429 12/31/19 0429 12/31/19 1037 01/01/20 0320  NA 140   < > 127* 138  K 2.2*   < > 3.1* 3.7  CL 110   < > 89* 96*  CO2 22   < > 25 28  GLUCOSE 90   < > 181* 122*  BUN 29*   < > 37* 39*  CREATININE 2.32*   < > 3.04* 3.36*  CALCIUM 6.6*   < > 9.0 9.4  MG 1.3*  --   --  2.6*   < > = values in this interval not displayed.   Liver Function Tests Recent Labs    12/31/19 0752  AST 13*  ALT 11  ALKPHOS 62  BILITOT 1.0  PROT 4.5*  ALBUMIN 2.3*   No results for input(s): LIPASE, AMYLASE in the last 72 hours. Cardiac Enzymes No results for input(s): CKTOTAL, CKMB, CKMBINDEX, TROPONINI in the last 72 hours.  BNP: BNP (last 3 results) Recent Labs    07/21/19 1334 10/13/19 1300 12/20/19 0700  BNP  69.8 301.6* 382.1*    ProBNP (last 3 results) Recent Labs    04/09/19 1458 05/20/19 1611 12/17/19 1532  PROBNP 469* 1,071* 5,909*     D-Dimer No results for input(s): DDIMER in the last 72 hours. Hemoglobin A1C No results for input(s): HGBA1C in the last 72 hours. Fasting Lipid Panel No results for input(s): CHOL, HDL, LDLCALC, TRIG, CHOLHDL, LDLDIRECT in the last 72 hours. Thyroid Function Tests No results for input(s): TSH, T4TOTAL, T3FREE, THYROIDAB in the last 72 hours.  Invalid input(s): FREET3  Other results:   Imaging    No results found.   Medications:     Scheduled Medications: . amiodarone  200 mg Oral Daily  . Chlorhexidine Gluconate Cloth  6 each Topical Daily  . enoxaparin (LOVENOX) injection  30 mg Subcutaneous Q24H  . pneumococcal 23  valent vaccine  0.5 mL Intramuscular Tomorrow-1000  . ramelteon  8 mg Oral QHS  . sodium chloride flush  10-40 mL Intracatheter Q12H  . sodium chloride flush  3 mL Intravenous Q12H  . sodium chloride flush  3 mL Intravenous Q12H  . torsemide  20 mg Oral BID    Infusions: . sodium chloride      PRN Medications: sodium chloride, acetaminophen **OR** acetaminophen, acetaminophen, menthol-cetylpyridinium, ondansetron (ZOFRAN) IV, sodium chloride flush, sodium chloride flush     Assessment/Plan   1. Acute systolic HF due to NICM  - Echo 2013 EF 25-30%. Cath normal cors - Echo 2020 EF 45-50% - Echo 12/19/19: EF 20% septal DK. RV ok TV restricted severe TR  - TEE 3/23 showed LVEF 20-25% with severe TR.  - suspect LV dysfunction due to PVCs +/- LBBB. PVCs/bigeminy have come in bursts. Unclear if overall burden high enough to cause CM but possibly so (now improved w/ amiodarone) - RHC on 0.125 of milrinone showed RV failure in setting of tricuspid and pulmonic valve disease with low LV filling pressure.   - Milrinone discontinued 3/21. Co-ox marginal at 58% today.  - CVP still high. Diuretics increased yesterday but now creatine worse - Off b-blocker for low output  - Off Entresto/spiro due to AKI - Case discussed with [facility] team on 3/24 felt not to have any durable options. - I had a long talk with her about this today and she said she knows she is nearing the end of the road and is at peace with it. She does not want any heroic measures - We will attempt to wean milrinone and optimize volume status over next few days and get her home - May help to have Palliative team involved - Change Code status to DNR/DNI   2. Frequent PVCs. - Zio Patch performed 04/07/2019 showed frequent PVCs with a 7.4% burden  - PVCs now well suppressed w/ AAD therapy. Continue amiodarone 200 mg daily    3. Acute on CKD3 - creatinine 1.3 in 11/20  - Creatinine  2.7>2.6 >>3.03 > 3.0 >  3.1>2.94>>3.17>>2.32>>3.04> 3.3 - suspect due to low output/cardiorenal   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  This has caused RV failure.   -TEE 3/23 confirmed severe TR - Plan as above  5. Metastatic carcinoid syndrome with liver mets dates back to 2001 - followed by Dr. Marin Olp - on somatuline  6. LBBB/IVCD - Would consider CRT if LVEF dose not improve w/ suppression of PVCs.   7. Hypokalemia/Hypomagnesemia - supp to keep K> 4.0,  Mg > 2.0 - K 3.7.     Length of Stay: 29  Glori Bickers, MD  01/01/2020, 12:18 PM  Advanced Heart Failure Team Pager [phone] (M-F; [place])  Please contact [facility] for night-coverage after hours (4p -7a ) and weekends on [email or web address]

## 2020-01-02 ENCOUNTER — Other Ambulatory Visit: Payer: Self-pay

## 2020-01-02 ENCOUNTER — Telehealth: Payer: Self-pay | Admitting: *Deleted

## 2020-01-02 LAB — BASIC METABOLIC PANEL
Anion gap: 13 (ref 5–15)
BUN: 39 mg/dL — ABNORMAL HIGH (ref 6–20)
CO2: 30 mmol/L (ref 22–32)
Calcium: 9.3 mg/dL (ref 8.9–10.3)
Chloride: 96 mmol/L — ABNORMAL LOW (ref 98–111)
Creatinine, Ser: 3.37 mg/dL — ABNORMAL HIGH (ref 0.44–1.00)
GFR calc Af Amer: 18 mL/min — ABNORMAL LOW (ref >60)
GFR calc non Af Amer: 15 mL/min — ABNORMAL LOW (ref >60)
Glucose, Bld: 120 mg/dL — ABNORMAL HIGH (ref 70–99)
Potassium: 3 mmol/L — ABNORMAL LOW (ref 3.5–5.1)
Sodium: 139 mmol/L (ref 135–145)

## 2020-01-02 LAB — COOXEMETRY PANEL
Carboxyhemoglobin: 1.1 % (ref 0.5–1.5)
Methemoglobin: 1 % (ref 0.0–1.5)
O2 Saturation: 62.2 %
Total hemoglobin: 12.7 g/dL (ref 12.0–16.0)

## 2020-01-02 LAB — GLUCOSE, CAPILLARY: Glucose-Capillary: 130 mg/dL — ABNORMAL HIGH (ref 70–99)

## 2020-01-02 MED ORDER — POTASSIUM CHLORIDE CRYS ER 20 MEQ PO TBCR
40.0000 meq | EXTENDED_RELEASE_TABLET | Freq: Once | ORAL | Status: AC
  Administered 2020-01-02: 40 meq via ORAL
  Filled 2020-01-02: qty 2

## 2020-01-02 MED ORDER — AMIODARONE HCL 200 MG PO TABS
200.0000 mg | ORAL_TABLET | Freq: Two times a day (BID) | ORAL | Status: DC
  Administered 2020-01-02 – 2020-01-03 (×2): 200 mg via ORAL
  Filled 2020-01-02 (×2): qty 1

## 2020-01-02 MED ORDER — FUROSEMIDE 10 MG/ML IJ SOLN
80.0000 mg | Freq: Once | INTRAMUSCULAR | Status: AC
  Administered 2020-01-02: 80 mg via INTRAVENOUS
  Filled 2020-01-02: qty 8

## 2020-01-02 MED ORDER — POTASSIUM CHLORIDE CRYS ER 20 MEQ PO TBCR
40.0000 meq | EXTENDED_RELEASE_TABLET | Freq: Two times a day (BID) | ORAL | Status: DC
  Administered 2020-01-02 – 2020-01-03 (×3): 40 meq via ORAL
  Filled 2020-01-02 (×3): qty 2

## 2020-01-02 NOTE — Progress Notes (Signed)
Pt. Called and was in the bathroom when this Rn and the NT arrived. Pt complained of dizziness and feeling funny and weak and whole body feeling tingly and hot and toes feeling numb.. V/s checked WNL( see flowsheet) . Dr. Aram Candela paged and notified. MD said this happened before when pt went to the bathroom and felt the same. Per MD, monitor pt and call her if symptoms lingers and she will come and see pt.

## 2020-01-02 NOTE — Progress Notes (Signed)
PT Cancellation Note  Patient Details Name: Amber Bautista MRN: [id number] DOB: Jan 24, 1971   Cancelled Treatment:    Reason Eval/Treat Not Completed: Patient not medically ready. Arrived on floor to find pt in midst of medical emergency (please see nurse's notes for full details). Will hold off on tx at this time and f/u at a later time as schedule allows.  Horald Chestnut, PT    Delford Field 01/02/2020, 3:28 PM

## 2020-01-02 NOTE — TOC Initial Note (Addendum)
Transition of Care [facility] [address]) - Initial/Assessment Note    Patient Details  Name: Amber Bautista MRN: [id number] Date of Birth: 12-Mar-1971  Transition of [facility]) CM/SW Contact:    Zenon Mayo, RN Phone Number: 01/02/2020, 1:33 PM  Clinical Narrative:                 NCM spoke with patient , she lives with relatives, she has a motorized scooter, tub chair, 3 n 1, w/ chair at home.  NCM  Received referral for home with hospice, NCM offered choice for home with hospice, she chose [place], referral given to [facility].  Sheree will contact patient on her cell [phone].  Patient states her brother may be able to transport her home on the weekend.  She states she will dc on the weekend.  Please follow up with [facility] over the weekend. Handoff states they will contact patient's mom, still working on accepting patient.  Expected Discharge Plan: Home w Hospice Care Barriers to Discharge: Continued Medical Work up   Patient Goals and CMS Choice Patient states their goals for this hospitalization and ongoing recovery are:: home with hospice CMS Medicare.gov Compare Post Acute Care list provided to:: Patient Choice offered to / list presented to : Patient  Expected Discharge Plan and Services Expected Discharge Plan: Home w Hospice Care In-house Referral: NA Discharge Planning Services: CM Consult Post Acute Care Choice: Hospice Living arrangements for the past 2 months: Apartment                   DME Agency: NA       HH Arranged: RN [place] Agency: [place] Date [place]: 01/02/20 Time [place]: 36 Representative spoke with at [place]: [place] Arrangements/Services Living arrangements for the past 2 months: Apartment Lives with:: Relatives Patient language and need for interpreter reviewed:: Yes Do you feel safe going back to the place where you live?: Yes      Need for Family Participation  in Patient Care: Yes (Comment) Care giver support system in place?: Yes (comment) Current home services: DME(has motorized scooter, tub chair, 3 n 1, w/ chair,) Criminal Activity/Legal Involvement Pertinent to Current Situation/Hospitalization: No - Comment as needed  Activities of Daily Living Home Assistive Devices/Equipment: Eyeglasses ADL Screening (condition at time of admission) Patient's cognitive ability adequate to safely complete daily activities?: Yes Is the patient deaf or have difficulty hearing?: No Does the patient have difficulty seeing, even when wearing glasses/contacts?: No Does the patient have difficulty concentrating, remembering, or making decisions?: No Patient able to express need for assistance with ADLs?: Yes Does the patient have difficulty dressing or bathing?: No Independently performs ADLs?: Yes (appropriate for developmental age) Does the patient have difficulty walking or climbing stairs?: Yes Weakness of Legs: Both Weakness of Arms/Hands: None  Permission Sought/Granted                  Emotional Assessment Appearance:: Appears stated age Attitude/Demeanor/Rapport: Engaged Affect (typically observed): Appropriate Orientation: : Oriented to Self, Oriented to Place, Oriented to  Time, Oriented to Situation Alcohol / Substance Use: Not Applicable Psych Involvement: No (comment)  Admission diagnosis:  SOB (shortness of breath) [R06.02] Acute exacerbation of CHF (congestive heart failure) (HCC) [I50.9] AKI (acute kidney injury) ([place]) [N17.9] Acute renal failure superimposed on chronic kidney disease, unspecified CKD stage, unspecified acute renal failure type ([place]) [N17.9, N18.9] Acute on chronic  HFrEF (heart failure with reduced ejection fraction) ([place]) [I50.23] Patient Active Problem List   Diagnosis Date Noted  . Acute renal failure superimposed on chronic kidney disease ([place])   . Acute on chronic HFrEF (heart failure with reduced ejection  fraction) ([place]) 12/21/2019  . Acute exacerbation of CHF (congestive heart failure) ([place]) 12/20/2019  . Anal fissure 08/13/2019  . Blood per rectum 08/13/2019  . Metastatic carcinoid tumor to intra-abdominal site ([place]) 08/13/2019  . Primary pancreatic carcinoid tumor 08/13/2019  . Syncope 07/21/2019  . Morbidly obese ([place]) 07/21/2019  . Bigeminy 06/20/2019  . Dilated cardiomyopathy ([place]) 04/02/2019  . Hypokalemia 04/02/2019  . Hypertensive heart disease with heart failure ([place]) 04/02/2019  . Frequent PVCs 04/01/2019  . Goals of care, counseling/discussion 03/31/2019  . Chronic systolic heart failure ([place]) 03/17/2019  . Cancer ([place]) 03/03/2015  . Abdominal pain 12/17/2014  . Breast cancer ([place])   . Cancer of upper-outer quadrant of female breast ([place]) 08/01/2012  . 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 [place] [address] Phone: [phone] Fax: [phone]  [id number] - [place], [place] - [address] [place] [address] Phone: [phone] Fax: [phone]     Social Determinants of Health (SDOH) Interventions    Readmission Risk Interventions Readmission Risk Prevention Plan 01/02/2020  Transportation Screening Complete  HRI or [place] Complete  Social Work Consult for [place] Planning/Counseling Complete  Medication Review Press photographer) Complete  Some recent data might be hidden

## 2020-01-02 NOTE — Progress Notes (Addendum)
CCMD called to report a QTc above 600. Patient denied distress. Obtained a 12 lead EKG. No appreciable changes from prior EKG on 3/21. QTc read 564. MD aware. No new orders. Will continue to monitor.

## 2020-01-02 NOTE — Telephone Encounter (Signed)
Call received from patient to inform Dr. Marin Olp that she has been referred to hospice.  Dr. Marin Olp notified and pt informed that Dr. Marin Olp will see her tomorrow per Dr. Marin Olp.  Pt very appreciative of Dr. Antonieta Pert concern.

## 2020-01-02 NOTE — Progress Notes (Addendum)
Patient ID: Amber Bautista, female   DOB: 1971-06-05, 49 y.o.   MRN: [id number]     Advanced Heart Failure Rounding Note  PCP-Cardiologist: Shirlee More, MD   Subjective:    Milrinone discontinued 3/21.     TEE 3/23 showed severe TR. RV normal size and function. LVEF 25%. Pulmonic valve not well visualized. + turbulence c/w pulmonic stenosis. Moderate PI  CO-OX 62% off milrinone.   Remains SOB with exertion. Wants to go home.   ________________________ RHC 12/26/19 (on milrinone 0.125):  RA = 11 RV = 43/5 PA = 19/3 (10) PCW = 3 Fick cardiac output/index = 5.22/2.5 Thermo CO/CI = 3.1/1.5 (liekly inaccurate due to severe TR) PVR = 1.3 WU Ao sat = 95% PA sat = 63%, 64%   Objective:   Weight Range: 99 kg Body mass index is 35.23 kg/m.   Vital Signs:   Temp:  [97.5 F (36.4 C)-98.7 F (37.1 C)] 97.9 F (36.6 C) (03/26 0821) Pulse Rate:  [84-113] 88 (03/26 0821) Resp:  [14-20] 17 (03/26 0821) BP: (123-140)/(93-113) 125/97 (03/26 0821) SpO2:  [95 %-100 %] 98 % (03/26 0821) Weight:  [99 kg] 99 kg (03/26 0312) Last BM Date: 12/31/19  Weight change: Filed Weights   12/31/19 0601 01/01/20 0458 01/02/20 0312  Weight: 99.7 kg 99.4 kg 99 kg    Intake/Output:   Intake/Output Summary (Last 24 hours) at 01/02/2020 1120 Last data filed at 01/02/2020 0823 Gross per 24 hour  Intake 1100 ml  Output 2150 ml  Net -1050 ml      Physical Exam  CVP 20  General:  Sitting on the side of the bed. No resp difficulty HEENT: normal Neck: supple. JVP to jaw  Carotids 2+ bilat; no bruits. No lymphadenopathy or thryomegaly appreciated. Cor: PMI nondisplaced. Irregular rate & rhythm. No rubs,  or murmurs. +S3 Lungs: clear Abdomen: soft, nontender, nondistended. No hepatosplenomegaly. No bruits or masses. Good bowel sounds. Extremities: no cyanosis, clubbing, rash, R and LLE 2+ edema. RUE PICC  Neuro: alert & orientedx3, cranial nerves grossly intact. moves all 4 extremities w/o  difficulty. Affect pleasant   Telemetry   NSR with frequent PVCs personally reviewed.    Labs    CBC No results for input(s): WBC, NEUTROABS, HGB, HCT, MCV, PLT in the last 72 hours. Basic Metabolic Panel Recent Labs    12/31/19 0429 12/31/19 1037 01/01/20 0320 01/02/20 0512  NA 140   < > 138 139  K 2.2*   < > 3.7 3.0*  CL 110   < > 96* 96*  CO2 22   < > 28 30  GLUCOSE 90   < > 122* 120*  BUN 29*   < > 39* 39*  CREATININE 2.32*   < > 3.36* 3.37*  CALCIUM 6.6*   < > 9.4 9.3  MG 1.3*  --  2.6*  --    < > = values in this interval not displayed.   Liver Function Tests Recent Labs    12/31/19 0752  AST 13*  ALT 11  ALKPHOS 62  BILITOT 1.0  PROT 4.5*  ALBUMIN 2.3*   No results for input(s): LIPASE, AMYLASE in the last 72 hours. Cardiac Enzymes No results for input(s): CKTOTAL, CKMB, CKMBINDEX, TROPONINI in the last 72 hours.  BNP: BNP (last 3 results) Recent Labs    07/21/19 1334 10/13/19 1300 12/20/19 0700  BNP 69.8 301.6* 382.1*    ProBNP (last 3 results) Recent Labs    04/09/19 1458  05/20/19 1611 12/17/19 1532  PROBNP 469* 1,071* 5,909*     D-Dimer No results for input(s): DDIMER in the last 72 hours. Hemoglobin A1C No results for input(s): HGBA1C in the last 72 hours. Fasting Lipid Panel No results for input(s): CHOL, HDL, LDLCALC, TRIG, CHOLHDL, LDLDIRECT in the last 72 hours. Thyroid Function Tests No results for input(s): TSH, T4TOTAL, T3FREE, THYROIDAB in the last 72 hours.  Invalid input(s): FREET3  Other results:   Imaging    No results found.   Medications:     Scheduled Medications: . amiodarone  200 mg Oral Daily  . Chlorhexidine Gluconate Cloth  6 each Topical Daily  . enoxaparin (LOVENOX) injection  30 mg Subcutaneous Q24H  . pneumococcal 23 valent vaccine  0.5 mL Intramuscular Tomorrow-1000  . potassium chloride  40 mEq Oral BID  . ramelteon  8 mg Oral QHS  . sodium chloride flush  10-40 mL Intracatheter Q12H    . sodium chloride flush  3 mL Intravenous Q12H  . sodium chloride flush  3 mL Intravenous Q12H    Infusions: . sodium chloride      PRN Medications: sodium chloride, acetaminophen **OR** acetaminophen, acetaminophen, menthol-cetylpyridinium, ondansetron (ZOFRAN) IV, sodium chloride flush, sodium chloride flush     Assessment/Plan   1. Acute systolic HF due to NICM  - Echo 2013 EF 25-30%. Cath normal cors - Echo 2020 EF 45-50% - Echo 12/19/19: EF 20% septal DK. RV ok TV restricted severe TR  - TEE 3/23 showed LVEF 20-25% with severe TR.  - suspect LV dysfunction due to PVCs +/- LBBB. PVCs/bigeminy have come in bursts. Unclear if overall burden high enough to cause CM but possibly so (now improved w/ amiodarone) - RHC on 0.125 of milrinone showed RV failure in setting of tricuspid and pulmonic valve disease with low LV filling pressure.   - Milrinone discontinued 3/21. Co-ox 62%  -CVP 20. Given 80 mg IV lasix. Will need to send home on 80 mg torsemide daily. -  - Off b-blocker for low output  - Off Entresto/spiro due to AKI - Case discussed with [facility] team on 3/24 felt not to have any durable options. - Dr Haroldine Laws had a long talk with her about this today and she said she knows she is nearing the end of the road and is at peace with it. She does not want any heroic measures - 2. Frequent PVCs. - Zio Patch performed 04/07/2019 showed frequent PVCs with a 7.4% burden  - PVCs now well suppressed w/ AAD therapy.  - Increase amio 200 mg twice a day.   3. 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  This has caused RV failure.   -TEE 3/23 confirmed severe TR - Plan as above  5. Metastatic carcinoid syndrome with liver mets dates back to  2001 - followed by Dr. Marin Olp - on somatuline  6. LBBB/IVCD - Not candidate for CRT-D at this point   7. Hypokalemia/Hypomagnesemia - supp to keep K> 4.0, Mg > 2.0 - K 3--> Supp K   8. DNR/DNI Completed GOLD DNR form.   Discussed plan for d/c. She wants to go home with her family and does not want heroic measures. She does not want CPR or intubated.  We discussed Hospice and at first she was concerned that she would be given medications to make her die. Lengthy discussion about hospice and that they would help with symptom management and provide support to her family along the way. I also told her she that the Hospice nurse would not administer medications and that she /family would give her medications. We also discussed possible equipment needs. She is interested in Hospice at discharge and her goal is to die at home with her family. Referral placed to [facility] for Hospice referral.   Amy Clegg NP-C  11:56 AM     Length of Stay: 12  Darrick Grinder, NP  01/02/2020, 11:20 AM  Advanced Heart Failure Team Pager [phone] (M-F; 7a - 4p)  Please contact [facility] for night-coverage after hours (4p -7a ) and weekends on [email or web address]  Patient seen and examined with the above-signed Advanced Practice Provider and/or Housestaff. I personally reviewed laboratory data, imaging studies and relevant notes. I independently examined the patient and formulated the important aspects of the plan. I have edited the note to reflect any of my changes or salient points. I have personally discussed the plan with the patient and/or family.  She has end-stage biventricular HF with worsening renal function due to cardiorenal syndrome. CVP remains 18-20. Co-ox 62%   General:  Weak appearing. No resp difficulty HEENT: normal Neck: supple. JVP to jaw. Carotids 2+ bilat; no bruits. No lymphadenopathy or thryomegaly appreciated. Cor: PMI nondisplaced. Regular rate & rhythm. 2/6 TR Lungs: clear Abdomen: obese soft,  nontender, nondistended. No hepatosplenomegaly. No bruits or masses. Good bowel sounds. Extremities: no cyanosis, clubbing, rash, 1-2+ edema Neuro: alert & orientedx3, cranial nerves grossly intact. moves all 4 extremities w/o difficulty. Affect pleasant  Weight is down but. CVP still way up. I don't think we can get much better.   Will give IV lasix today and switch to po torsemide. Agree with referral for home hospice   Cardiac meds for home  Amio 200 bid Torsemide 80 daily Kdur 40 daily   Glori Bickers, MD  1:18 PM

## 2020-01-02 NOTE — Progress Notes (Signed)
   Subjective: Pt seen at the bedside this morning. Had difficulty sleeping due to alarms in the room. Is interested in going home if there is nothing more that can be done for her. Discussed coordinating her home arrangements today in anticipation of discharge over the week. Pt agreeable and understanding of her situation. Asking questions regarding prognosis, deferred specifics to AHF team.  Objective:  Vital signs in last 24 hours: Vitals:   01/02/20 0309 01/02/20 0310 01/02/20 0311 01/02/20 0312  BP:  (!) 130/100    Pulse: 87 85 86 (!) 113  Resp: 17 14 19 20   Temp:    98.7 F (37.1 C)  TempSrc:    Oral  SpO2: 100% 100% 100% 98%  Weight:    99 kg  Height:       Physical Exam Vitals and nursing note reviewed.  Constitutional:      General: She is not in acute distress.    Appearance: She is ill-appearing (chronically).  Cardiovascular:     Rate and Rhythm: Normal rate and regular rhythm.     Heart sounds: Murmur present.  Musculoskeletal:     Comments: Bilateral LE pitting edema, +1, to mid-shin  Skin:    General: Skin is warm and dry.  Neurological:     Mental Status: She is alert.    Assessment/Plan:  Active Problems:   Acute exacerbation of CHF (congestive heart failure) (HCC)   Acute on chronic HFrEF (heart failure with reduced ejection fraction) (HCC)   Acute renal failure superimposed on chronic kidney disease ([place])  Ms. Ebey is a 49 year old F with significant PMH of metastatic carcinoid tumor, stage IIB ductal triple negative L breast cancer, HFrEF, nonischemic cardiomyopathy, and obesity, who presents with shortness of breath and a general unwell feelingconsistent with acute HFrEF exacerbation.  End-stage CHF Acute on chronic heart failure exacerbationfrom NICM Frequent PVCs LBBB Work-up includingTTE, RHC, and TEEconsistent with biventricularHF due to severe TR and RHF secondary to progression of carcinoid valve disease, in addition to LV failure with  EF of 25% related to LBBB and worsened by PVCs. - net -1.1L with 0.4kg down in weight - CVP remains elevated to 19 - Cr 3.04 > 3.36 > 3.37 - net +200cc, and wt down 0.3kg after torsemide 20mg  BID -replete Mg and K to keep Mg > 2 and K > 4  Advanced heart failure consulted, appreciate their input in this case - case discussed with [facility] team who did not see any additional treatment options - pt aware she is near the end due to her heart failure, at peace with it - volume status appears to be fully optimized at this time - IV lasix today and then transition to PO torsemide - discharge meds - amiodarone 200mg  BID, toresemide 80mg  daily, and 50mEq daily  Palliative care consulted, appreciate their input and recommendations - home hospice at discharge  Prior to Admission Living Arrangement: home Anticipated Discharge Location: home with hospice Barriers to Discharge: IV diuresis today and hospice coordination Dispo: Anticipated discharge in approximately 1-2 day(s).   Ladona Horns, MD 01/02/2020, 7:23 AM Pager: [phone]

## 2020-01-03 DIAGNOSIS — I5089 Other heart failure: Secondary | ICD-10-CM

## 2020-01-03 DIAGNOSIS — Z66 Do not resuscitate: Secondary | ICD-10-CM

## 2020-01-03 DIAGNOSIS — C7A098 Malignant carcinoid tumors of other sites: Secondary | ICD-10-CM

## 2020-01-03 LAB — COOXEMETRY PANEL
Carboxyhemoglobin: 0.9 % (ref 0.5–1.5)
Methemoglobin: 0.9 % (ref 0.0–1.5)
O2 Saturation: 50.9 %
Total hemoglobin: 13.8 g/dL (ref 12.0–16.0)

## 2020-01-03 LAB — BASIC METABOLIC PANEL
Anion gap: 16 — ABNORMAL HIGH (ref 5–15)
BUN: 40 mg/dL — ABNORMAL HIGH (ref 6–20)
CO2: 29 mmol/L (ref 22–32)
Calcium: 9.9 mg/dL (ref 8.9–10.3)
Chloride: 94 mmol/L — ABNORMAL LOW (ref 98–111)
Creatinine, Ser: 3.45 mg/dL — ABNORMAL HIGH (ref 0.44–1.00)
GFR calc Af Amer: 17 mL/min — ABNORMAL LOW (ref >60)
GFR calc non Af Amer: 15 mL/min — ABNORMAL LOW (ref >60)
Glucose, Bld: 122 mg/dL — ABNORMAL HIGH (ref 70–99)
Potassium: 3.4 mmol/L — ABNORMAL LOW (ref 3.5–5.1)
Sodium: 139 mmol/L (ref 135–145)

## 2020-01-03 LAB — MAGNESIUM: Magnesium: 1.9 mg/dL (ref 1.7–2.4)

## 2020-01-03 MED ORDER — POTASSIUM CHLORIDE CRYS ER 20 MEQ PO TBCR: 40.0000 meq | EXTENDED_RELEASE_TABLET | Freq: Every day | ORAL | 3 refills | Status: AC

## 2020-01-03 MED ORDER — TORSEMIDE 20 MG PO TABS: 80.0000 mg | ORAL_TABLET | Freq: Every day | ORAL | 1 refills | Status: AC

## 2020-01-03 MED ORDER — LANREOTIDE ACETATE 120 MG/0.5ML ~~LOC~~ SOLN: 120.0000 mg | Freq: Once | SUBCUTANEOUS | Status: DC

## 2020-01-03 MED ORDER — AMIODARONE HCL 200 MG PO TABS: 200.0000 mg | ORAL_TABLET | Freq: Two times a day (BID) | ORAL | 1 refills | Status: AC

## 2020-01-03 MED ORDER — OCTREOTIDE ACETATE 30 MG IM KIT
30.0000 mg | PACK | Freq: Once | INTRAMUSCULAR | Status: DC
  Filled 2020-01-03: qty 1

## 2020-01-03 NOTE — Progress Notes (Signed)
   Subjective:  She is feeling ok this morning. She continues to feel weak after having BM. Discussed vagal episodes that she has likely been having and that she will have to be careful at home. They are planning on having a commode nearby at home. She otherwise feels well and is looking forward to going home.   Objective:  Vital signs in last 24 hours: Vitals:   01/02/20 1952 01/02/20 1955 01/03/20 0343 01/03/20 0345  BP: 124/89  128/87   Pulse:  84    Resp:  16    Temp:  98.2 F (36.8 C) 97.7 F (36.5 C)   TempSrc:  Oral Oral   SpO2:  96%    Weight:    98 kg  Height:        Constitution: NAD, appears stated age Respiratory: non-labored breathing, on RA  MSK: moving all extremities, strength symmetrical Neuro: normal affect, a&ox3 Skin: c/d/i   Assessment/Plan:  Active Problems:   Acute exacerbation of CHF (congestive heart failure) (HCC)   Acute on chronic HFrEF (heart failure with reduced ejection fraction) (HCC)   Acute renal failure superimposed on chronic kidney disease ([place])  49yo female with significant PMH of metastatic carcinoid tumor, HFrEF, nonischemic cardiomyopathy, and obesity who presented with shortness of breath and acute heart failure.   End Stage CHF Severe Tricuspid/Pulmonic Regurge Frequent PVCs Options for ongoing care discussed with heart failure team, [facility], and EP. She is being transitioned to hospice as there are no additional treatment options at this point. She is at peace with this and looking forward to going home today.  - cont. amio 200 mg bid, torsemide 80 mg daily and Kdur 40 meq daily  - d/c picc and tele    Dispo: Anticipated discharge today.   Marty Heck, DO 01/03/2020, 8:23 AM Pager: [phone]

## 2020-01-03 NOTE — Progress Notes (Signed)
Palliative-  Consult received and chart reviewed. Noted that patient is preparing for discharge home with Hospice services and has been referred to [place]. I do not think a Palliative consult will add to patient's plan of care. Will sign off. Please feel free to reconsult or call if needed.   Thank you for this consult.   Mariana Kaufman, AGNP-C Palliative Medicine  Please call Palliative Medicine team phone with any questions [phone]. For individual providers please see AMION.  No charge

## 2020-01-03 NOTE — Consult Note (Signed)
Referral MD  Reason for Referral: Metastatic carcinoid with carcinoid heart syndrome  Chief Complaint  Patient presents with  . Shortness of Breath  : My heart just does not work well anymore.  HPI: Ms. Herne is well-known to me.  She is a very nice 49 year old Hispanic female.  I first saw her probably about 10 years ago.  She had localized breast cancer.  She was treated with adjuvant chemotherapy.  I think this included Adriamycin.  She has been out of [place] for a while.  She is up in [place].  I think she is up there for about 6 years.  She came back to [place].  She was found to have carcinoid.  She has extensive carcinoid disease.  I think the primary is in the pancreas.  We try to treat her with Somatuline.  She has been hospitalized on many occasions because of heart failure..  The problem is that she does have cardiomyopathy secondary to the Adriamycin that she received.  She has a very poor ejection fraction.  She also has carcinoid heart syndrome.  Her last chromogranin A level was 4600 about 2 weeks ago days ago.  She has been in the hospital.  She has progressive heart failure.  She is followed by cardiology.  She has significant carcinoid heart issues because of right-sided heart failure and tricuspid valve and pulmonic valve regurgitation.  There is not much that can be done for her.  She now is being discharged and going to be followed at home by hospice.  She has a calm attitude about all of this.  She has a very strong faith.  She is a DNR now.  I just feel bad for her.  She is not that old.  She just has such symptomatic heart disease.  She actually looks quite good when I saw this morning.  Looks like she had a lot of fluid taken off for.  I do think that we should try to continue Somatuline with her.  I does think that this might help with some of the carcinoid symptoms that she has.  Overall, I would say her performance status is ECOG  2.    Past Medical History:  Diagnosis Date  . Abdominal pain 12/17/2014  . Breast cancer ([place]) 07/12/12   Stage IIB Ductal Carcinoma to  Upper Outer Quadrant: Left Breast: Triple Negative  . Cancer ([place]) 03/03/2015   Overview:  in intestine-2007, in liver-2007, and left breast-2013  . Cancer of upper-outer quadrant of female breast ([place]) 08/01/2012   Image guided biopsy Oct., 2013 -    2 cm by MRI, solitary;     IDC; TNBC; positive LVI;      . Carcinoid tumor of intestine 2001   Mets 2007  . Carcinoma of breast treated with adjuvant chemotherapy ([place])    4 cycles of Carboplatin / Taxol  . Chronic congestive heart failure ([place]) 03/17/2019  . Congestive heart failure (CHF) ([place])   . Diarrhea    Sandostatin  . Frequent PVCs 04/01/2019  . Goals of care, counseling/discussion 03/31/2019  . Hypertension   . Metastatic carcinoid tumor ([place])   . Morbidly obese ([place])   . Personal history of chemotherapy 2013   Left Breast Cancer  . Personal history of radiation therapy 2013   Left Breast Cancer  . S/P radiation therapy 02/04/2013-02/21/2013   Left Breast and Axilla / 46.8 Gy in 26 fractions were planned (she only received 18 Gy in  10 fractions)  . Syncope 07/2019  :  Past Surgical History:  Procedure Laterality Date  . BREAST BIOPSY Left 11/2019  . BREAST LUMPECTOMY Left 2013  . BREAST SURGERY  08/23/12   Lt br lumpectomy  . Pace 2007  . COLON SURGERY  2001   Cancer/ Intestinal Resection  . OVARIAN CYST REMOVAL  2001  . PARTIAL MASTECTOMY WITH NEEDLE LOCALIZATION AND AXILLARY SENTINEL LYMPH NODE BX  08/23/2012   Procedure: PARTIAL MASTECTOMY WITH NEEDLE LOCALIZATION AND AXILLARY SENTINEL LYMPH NODE BX;  Surgeon: Adin Hector, MD;  Location: [facility];  Service: General;  Laterality: Left;  blue dye injection   . PORTACATH PLACEMENT  08/23/2012   Procedure: INSERTION PORT-A-CATH;  Surgeon: Adin Hector, MD;  Location: [facility];  Service: General;  Laterality: Right;   intraop ultrasound  . RIGHT HEART CATH N/A 12/26/2019   Procedure: RIGHT HEART CATH;  Surgeon: Jolaine Artist, MD;  Location: [facility];  Service: Cardiovascular;  Laterality: N/A;  . TEE WITHOUT CARDIOVERSION N/A 12/30/2019   Procedure: TRANSESOPHAGEAL ECHOCARDIOGRAM (TEE);  Surgeon: Jolaine Artist, MD;  Location: [facility] ENDOSCOPY;  Service: Cardiovascular;  Laterality: N/A;  :   Current Facility-Administered Medications:  .  0.9 %  sodium chloride infusion, 250 mL, Intravenous, PRN, Bensimhon, Shaune Pascal, MD .  acetaminophen (TYLENOL) tablet 650 mg, 650 mg, Oral, Q6H PRN, 650 mg at 12/26/19 2051 **OR** acetaminophen (TYLENOL) suppository 650 mg, 650 mg, Rectal, Q6H PRN, Bensimhon, Shaune Pascal, MD .  acetaminophen (TYLENOL) tablet 650 mg, 650 mg, Oral, Q4H PRN, Bensimhon, Shaune Pascal, MD .  amiodarone (PACERONE) tablet 200 mg, 200 mg, Oral, BID, Bensimhon, Shaune Pascal, MD, 200 mg at 01/02/20 2032 .  Chlorhexidine Gluconate Cloth 2 % PADS 6 each, 6 each, Topical, Daily, Bensimhon, Shaune Pascal, MD, 6 each at 01/02/20 1037 .  enoxaparin (LOVENOX) injection 30 mg, 30 mg, Subcutaneous, Q24H, Bensimhon, Shaune Pascal, MD, 30 mg at 01/02/20 1236 .  lanreotide acetate (SOMATULINE DEPOT) injection 120 mg, 120 mg, Subcutaneous, Once, Reznor Ferrando, Rudell Cobb, MD .  menthol-cetylpyridinium (CEPACOL) lozenge 3 mg, 1 lozenge, Oral, PRN, Maudie Mercury, MD, 3 mg at 12/30/19 2339 .  ondansetron (ZOFRAN) injection 4 mg, 4 mg, Intravenous, Q6H PRN, Bensimhon, Shaune Pascal, MD, 4 mg at 01/02/20 1559 .  pneumococcal 23 valent vaccine (PNEUMOVAX-23) injection 0.5 mL, 0.5 mL, Intramuscular, Tomorrow-1000, Hoffman, Erik C, DO .  potassium chloride SA (KLOR-CON) CR tablet 40 mEq, 40 mEq, Oral, BID, Ladona Horns, MD, 40 mEq at 01/02/20 2032 .  ramelteon (ROZEREM) tablet 8 mg, 8 mg, Oral, QHS, Bensimhon, Shaune Pascal, MD, 8 mg at 01/02/20 2033 .  sodium chloride flush (NS) 0.9 % injection 10-40 mL, 10-40 mL, Intracatheter, Q12H, Bensimhon,  Shaune Pascal, MD, 10 mL at 12/31/19 2036 .  sodium chloride flush (NS) 0.9 % injection 10-40 mL, 10-40 mL, Intracatheter, PRN, Bensimhon, Shaune Pascal, MD .  sodium chloride flush (NS) 0.9 % injection 3 mL, 3 mL, Intravenous, Q12H, Bensimhon, Shaune Pascal, MD .  sodium chloride flush (NS) 0.9 % injection 3 mL, 3 mL, Intravenous, Q12H, Bensimhon, Shaune Pascal, MD, 3 mL at 12/31/19 2037 .  sodium chloride flush (NS) 0.9 % injection 3 mL, 3 mL, Intravenous, PRN, Bensimhon, Shaune Pascal, MD:  . amiodarone  200 mg Oral BID  . Chlorhexidine Gluconate Cloth  6 each Topical Daily  . enoxaparin (LOVENOX) injection  30 mg Subcutaneous Q24H  . lanreotide acetate  120 mg Subcutaneous Once  .  pneumococcal 23 valent vaccine  0.5 mL Intramuscular Tomorrow-1000  . potassium chloride  40 mEq Oral BID  . ramelteon  8 mg Oral QHS  . sodium chloride flush  10-40 mL Intracatheter Q12H  . sodium chloride flush  3 mL Intravenous Q12H  . sodium chloride flush  3 mL Intravenous Q12H  :  Allergies  Allergen Reactions  . Sulfamethoxazole-Trimethoprim Swelling and Hypertension    Swelling and hypertension after taking, was admitted to hospital  . Adhesive [Tape] Rash and Other (See Comments)    Causes SEVERE RASHES AND BLISTERS  :  Family History  Problem Relation Age of Onset  . Heart attack Father   . Diabetes Father   . Hypertension Mother   . Diabetes Mother   . Glaucoma Mother   . Hypertension Brother   . Asthma Brother   . Cancer Paternal Grandmother   . Colon cancer Neg Hx   . Esophageal cancer Neg Hx   . Rectal cancer Neg Hx   . Stomach cancer Neg Hx   :  Social History   Socioeconomic History  . Marital status: Divorced    Spouse name: Not on file  . Number of children: Not on file  . Years of education: Not on file  . Highest education level: Not on file  Occupational History  . Not on file  Tobacco Use  . Smoking status: Never Smoker  . Smokeless tobacco: Never Used  . Tobacco comment: NEVER SMOKED   Substance and Sexual Activity  . Alcohol use: No    Alcohol/week: 0.0 standard drinks  . Drug use: No  . Sexual activity: Not Currently    Comment: menarche age 43, P 3, G 1, 1 misscarriage  Other Topics Concern  . Not on file  Social History Narrative  . Not on file   Social Determinants of Health   Financial Resource Strain:   . Difficulty of Paying Living Expenses:   Food Insecurity:   . Worried About Charity fundraiser in the Last Year:   . Arboriculturist in the Last Year:   Transportation Needs:   . Film/video editor (Medical):   Marland Kitchen Lack of Transportation (Non-Medical):   Physical Activity:   . Days of Exercise per Week:   . Minutes of Exercise per Session:   Stress:   . Feeling of Stress :   Social Connections:   . Frequency of Communication with Friends and Family:   . Frequency of Social Gatherings with Friends and Family:   . Attends Religious Services:   . Active Member of Clubs or Organizations:   . Attends Archivist Meetings:   Marland Kitchen Marital Status:   Intimate Partner Violence:   . Fear of Current or Ex-Partner:   . Emotionally Abused:   Marland Kitchen Physically Abused:   . Sexually Abused:   :  Review of Systems  Constitutional: Positive for malaise/fatigue.  HENT: Negative.   Eyes: Negative.   Respiratory: Positive for shortness of breath.   Cardiovascular: Positive for orthopnea and leg swelling.  Gastrointestinal: Positive for diarrhea.  Genitourinary: Negative.   Musculoskeletal: Negative.   Skin: Positive for rash.  Neurological: Negative.   Endo/Heme/Allergies: Negative.   Psychiatric/Behavioral: Negative.      Exam:    Physical Exam Vitals reviewed.  HENT:     Head: Normocephalic and atraumatic.  Eyes:     Pupils: Pupils are equal, round, and reactive to light.  Cardiovascular:     Rate  and Rhythm: Normal rate and regular rhythm.     Heart sounds: Normal heart sounds.     Comments: Cardiac exam shows a regular rate and rhythm.   She has a 4/6 systolic murmur. Pulmonary:     Effort: Pulmonary effort is normal.     Breath sounds: Normal breath sounds.  Abdominal:     General: Bowel sounds are normal.     Palpations: Abdomen is soft.  Musculoskeletal:        General: No tenderness or deformity. Normal range of motion.     Cervical back: Normal range of motion.  Lymphadenopathy:     Cervical: No cervical adenopathy.  Skin:    General: Skin is warm and dry.     Findings: No erythema or rash.  Neurological:     Mental Status: She is alert and oriented to person, place, and time.  Psychiatric:        Behavior: Behavior normal.        Thought Content: Thought content normal.        Judgment: Judgment normal.     Patient Vitals for the past 24 hrs:  BP Temp Temp src Pulse Resp SpO2 Weight  01/03/20 0345 -- -- -- -- -- -- 216 lb 1.6 oz (98 kg)  01/03/20 0343 128/87 97.7 F (36.5 C) Oral -- -- -- --  01/02/20 1955 -- 98.2 F (36.8 C) Oral 84 16 96 % --  01/02/20 1952 124/89 -- -- -- -- -- --  01/02/20 1520 (!) 130/97 -- -- 87 19 99 % --  01/02/20 1440 -- -- -- 92 14 93 % --  01/02/20 1435 -- -- -- 87 12 99 % --  01/02/20 1430 -- -- -- 87 10 97 % --  01/02/20 1425 -- -- -- 90 16 99 % --  01/02/20 1420 -- -- -- 93 17 97 % --  01/02/20 1415 -- -- -- (!) 122 14 94 % --  01/02/20 1410 -- -- -- 91 15 100 % --  01/02/20 1405 -- -- -- 93 (!) 24 96 % --  01/02/20 1400 -- -- -- -- (!) 0 -- --  01/02/20 1350 -- -- -- 97 12 97 % --  01/02/20 1345 -- -- -- 94 (!) 22 100 % --  01/02/20 1340 -- -- -- 94 20 100 % --  01/02/20 1335 -- -- -- (!) 101 19 100 % --  01/02/20 1330 -- -- -- 95 14 100 % --  01/02/20 1325 -- -- -- 97 12 98 % --  01/02/20 1320 -- -- -- -- 18 -- --  01/02/20 1315 -- -- -- -- 13 -- --  01/02/20 1310 -- -- -- -- 11 -- --  01/02/20 1305 -- -- -- -- 16 -- --  01/02/20 1300 -- -- -- -- 17 -- --  01/02/20 1255 -- -- -- -- 14 -- --  01/02/20 1250 -- -- -- -- 17 -- --  01/02/20 1245 -- -- -- -- 18  -- --  01/02/20 1240 -- -- -- -- 14 -- --  01/02/20 1235 -- -- -- -- (!) 7 -- --  01/02/20 1230 -- -- -- -- 14 -- --  01/02/20 1225 -- -- -- 92 (!) 21 99 % --  01/02/20 1220 -- -- -- 90 15 99 % --  01/02/20 1215 -- -- -- 90 (!) 23 100 % --  01/02/20 1210 -- -- -- 92 17 93 % --  01/02/20 1205 -- -- --  92 17 100 % --  01/02/20 1200 -- -- -- 93 (!) 28 100 % --  01/02/20 1155 -- -- -- 92 14 97 % --  01/02/20 1150 (!) 125/98 98.1 F (36.7 C) Oral 91 18 99 % --  01/02/20 1150 -- -- -- 90 15 99 % --  01/02/20 1145 -- -- -- 91 (!) 26 98 % --  01/02/20 1140 -- -- -- 91 18 99 % --  01/02/20 1135 -- -- -- (!) 44 17 98 % --  01/02/20 1130 -- -- -- 86 (!) 34 98 % --  01/02/20 1125 -- -- -- 90 19 97 % --  01/02/20 1120 -- -- -- 87 16 96 % --  01/02/20 1115 -- -- -- 87 (!) 36 100 % --  01/02/20 1110 -- -- -- 87 16 99 % --  01/02/20 1105 -- -- -- 88 13 99 % --  01/02/20 1100 -- -- -- 87 11 100 % --  01/02/20 0821 (!) 125/97 97.9 F (36.6 C) Oral 88 17 98 % --     No results for input(s): WBC, HGB, HCT, PLT in the last 72 hours. Recent Labs    01/02/20 0512 01/03/20 0545  NA 139 139  K 3.0* 3.4*  CL 96* 94*  CO2 30 29  GLUCOSE 120* 122*  BUN 39* 40*  CREATININE 3.37* 3.45*  CALCIUM 9.3 9.9    Blood smear review: None  Pathology: None    Assessment and Plan: Ms. Prindle is a very nice 49 year old Hispanic female.  She unfortunately has cardiomyopathy secondary to Adriamycin.  She has heart failure secondary to this.  She then has developed carcinoid heart issues.  She just is very debilitated by her cardiac dysfunction.  Again I do think that we could help her quality of life with the Somatuline.  I just think that this could help try to decrease her symptoms from the carcinoid tumor.  I know studies have been done that have shown that there is an improvement in quality of life with Somatuline every 2 weeks.  I know that if hospice is going to follow her, I do still know if they  will allow her to take any Somatuline.  We basically are dealing with quality of life at this point.  She is so nice.  She has a very tough problem with that there just does not seem to be any way to improve.  Lattie Haw, MD  Demetra Shiner

## 2020-01-03 NOTE — TOC Transition Note (Signed)
Transition of Care [facility]) - CM/SW Discharge Note   Patient Details  Name: Amber Bautista MRN: [id number] Date of Birth: 08/16/1971  Transition of Care [facility]) CM/SW Contact:  Bartholomew Crews, RN Phone Number: [phone] 01/03/2020, 9:27 AM   Clinical Narrative:    Spoke with Hillary at [facility]. They have connected with patient and DME has been ordered. Patient told hospice that she does not need to wait for DME to be delivered before discharging home. [place] to meet with patient and family in the home tomorrow. Patient to transition home today. No further TOC needs identified.    Final next level of care: [place] Barriers to Discharge: No Barriers Identified   Patient Goals and CMS Choice Patient states their goals for this hospitalization and ongoing recovery are:: home with hospice CMS Medicare.gov Compare Post Acute Care list provided to:: Patient Choice offered to / list presented to : Patient  Discharge Placement                       Discharge Plan and Services In-house Referral: NA Discharge Planning Services: CM Consult Post Acute Care Choice: Hospice            DME Agency: NA       HH Arranged: RN Frank Agency: [place] Date [place]: 01/02/20 Time [place]: 9937 Representative spoke with at [place]: [place] ([place]) Interventions     Readmission Risk Interventions Readmission Risk Prevention Plan 01/02/2020  Transportation Screening Complete  HRI or [place] Complete  Social Work Consult for [place] Planning/Counseling Complete  Medication Review Press photographer) Complete  Some recent data might be hidden

## 2020-01-03 NOTE — Progress Notes (Signed)
Pt reported being lightheaded upon return to bed from the bathroom this AM. Pt had disconnected herself from telemetry just before her walk to the bathroom. No N/V with this episode. Pt not currently lightheaded. Telemetry re-connected. Oncoming RN made aware.

## 2020-01-03 NOTE — Discharge Summary (Signed)
Name: Amber Bautista MRN: [id number] DOB: 1971-01-01 49 y.o. PCP: Jolinda Croak, MD  Date of Admission: 12/20/2019  6:30 AM Date of Discharge: 01/03/2020 Attending Physician: Johnnette Gourd, DO  Discharge Diagnosis: 1. Acute End Stage Systolic Heart Failure 2. Carcinoid Heart Disease 3. Cardiorenal syndrome with CKD Stage III 4. Frequent PVCs 5. Hypokalemia  Discharge Medications: Allergies as of 01/03/2020      Reactions   Sulfamethoxazole-trimethoprim Swelling, Hypertension   Swelling and hypertension after taking, was admitted to hospital   Adhesive [tape] Rash, Other (See Comments)   Causes SEVERE RASHES AND BLISTERS      Medication List    STOP taking these medications   Entresto 49-51 MG Generic drug: sacubitril-valsartan   furosemide 40 MG tablet Commonly known as: LASIX   metoprolol succinate 50 MG 24 hr tablet Commonly known as: TOPROL-XL   naltrexone 50 MG tablet Commonly known as: DEPADE     TAKE these medications   amiodarone 200 MG tablet Commonly known as: Pacerone Take 1 tablet (200 mg total) by mouth 2 (two) times daily.   calcium carbonate 500 MG chewable tablet Commonly known as: TUMS - dosed in mg elemental calcium Chew 1 tablet by mouth daily as needed for indigestion or heartburn.   potassium chloride SA 20 MEQ tablet Commonly known as: KLOR-CON Take 2 tablets (40 mEq total) by mouth daily. What changed: when to take this   Slow-Mag 64 MG Tbec SR tablet Generic drug: magnesium chloride Take 1 tablet (64 mg total) by mouth 2 (two) times daily. What changed:   how much to take  when to take this   torsemide 20 MG tablet Commonly known as: DEMADEX Take 4 tablets (80 mg total) by mouth daily.       Disposition and follow-up:   Amber Bautista was discharged from [facility] in Stable condition.  At the hospital follow up visit please address:   1. Acute End Stage Systolic Heart Failure: EF advanced to  15%. LV failure 2/2 LBBB vs. PVCs. TEE showed severe TR and pulmonic stenosis. Diuresis limited by worsening renal function. Milrinone dependent. Discussed with [facility] for possible valve repair but ultimately not thought to be a candidate. She wanted to go home and was transitioned to comfort care, milrinone was weaned. BP and renal function remained stable at that time and the following day she discharged home with hospice.  2. Carcinoid Heart Disease: causing severe tricuspid regurge and pulmonic stenosis.  3. Cardiorenal syndrome with CKD Stage III: progressed to CKD stage IV. Secondary to systolic heart failure with decreased perfusion. Discharged with torsemide 80 mg daily and Kdur 42mEq bid.  4. Frequent PVCs: EP consulted for possible LV pacing but ultimately was not thought to be a candidate. Improved prior to discharge with amio 400 mg bid. This was reduced due to prolonged qt, and she was discharged with amiodarone 200 mg bid 5. Hypokalemia: 2/2 diuresis, discharged on [place]   2.  Labs / imaging needed at time of follow-up: none  3.  Pending labs/ test needing follow-up: none  Follow-up Appointments: Follow-up Information    [facility] Follow up.   Contact information: [place] [id number] [place] Hospital Course by problem list: 1. Acute End Stage Systolic Heart Failure 2. Carcinoid Heart Disease 4. Frequent PVCs  Amber Bautista is a 49yo female with PMH of metastatic carcinoid tumor, LBBB,  heart failure with reduced ejection fraction, mod-severe TR, and obesity who presented with progressive shortness of breath, weight gain, and worsening renal function. Echo after admission showed further reduced ejection fraction of 20% with elevated right ventricular pressures, severe tricuspid stenosis and regurge consistent with her carcinoid heart disease, and moderate-severe pulmonic regurge. Heart failure was consulted and she  was placed on lasix gtt and milrinone. Beta blocker was held 2/2 acute exacerbation while ARB/ARNi, entresto, and spiro could not be started due to AKI. While breathing improved with diuresis, this was limited by worsening renal function. Throughout hospitalization she was also milrinone dependent with worsening renal function and volume overload whenever this was weaned.  She also was experiencing frequent PVCs and bigeminy, and her LV heart failure intially was thought to be secondary to her LBBB vs. PVCs. RHC done 3/19 showed RV failure in setting of tricuspid regurge and pulmonic stenosis with low LV filling pressures.  Electrophysiology was also consulted for possible left ventricular pacing after patient's volume was optimized and if she was able to be discharged home. While hospitalized PVCs were controlled with amio gtt and she was transitioned to amiodarone 400 mg bid. This caused prolonged qt and she was ultimately transitioned to 200 mg bid. She did continue to have intermittent episodes of bigeminy but PVC burden was improved.  TEE was done to further evaluate right sided valves prior to speaking with [facility] for possible intervention. RV size and function was normal although still showed severe TR and pulmonic stenosis. Dr. Haroldine Bautista spoke with [facility] to determine if valvular repair could be done, but ultimately she was not a candidate for percutaneous or surgical TV replacement.  Findings and options at this point were limited and discussed with the patient. She decided that she did not want further work-up and preferred to go home to be with her family. She was weaned off the milrinione drip with stable blood pressure and discharged with home hospice in addition to torsemide 80 mg qd, amiodarone 200 mg bid and Kdur 20 mEq bid.    3. Cardiorenal syndrome with CKD Stage III Secondary to poor perfusion from worsening heart failure. Renal US was done which showed no signs of obstruction and normal  kidneys. UA was within normal limits. Renal function remained stable throughout admission but ultimately did not improve with diuresis or milrinone.   Discharge Vitals:   BP 114/78   Pulse 78   Temp 97.7 F (36.5 C) (Oral)   Resp 16   Ht 5\' 6"  (1.676 m)   Wt 98 kg   LMP 08/18/2014 (LMP Unknown) Comment: menopausal: chemo induced  SpO2 97%   BMI 34.88 kg/m   Pertinent Labs, Studies, and Procedures:   TEE 3/23  showed severe TR. RV normal size and function. LVEF 25%. Pulmonic valve not well visualized. + turbulence c/w pulmonic stenosis. Moderate PI  TTE 12/20/19 1. Left ventricular ejection fraction, by estimation, is 20%. The left  ventricle has severely decreased function. The left ventricle demonstrates  global hypokinesis. The left ventricular internal cavity size was mildly  to moderately dilated. Left  ventricular diastolic parameters are indeterminate. There is abnormal  (paradoxical) septal motion, consistent with right ventricular volume  overload.  2. Right ventricular systolic function is hyperdynamic. The right  ventricular size is normal. There is mildly elevated pulmonary artery  systolic pressure. The estimated right ventricular systolic pressure is  60.6 mmHg.  3. Right atrial size was mild to moderately dilated.  4. The mitral valve  is normal in structure. Mild mitral valve  regurgitation. No evidence of mitral stenosis.  5. Fixed and retracted tricuspid valve leaflets consistent with carcinoid  heart disease. Incomplete coaptation leads to wide-open tricuspid  regurgitation. The tricuspid valve is abnormal. Tricuspid valve  regurgitation is severe. Severe tricuspid  stenosis.  6. The aortic valve is normal in structure. Aortic valve regurgitation is  trivial. No aortic stenosis is present.  7. Pulmonic valve regurgitation is moderate to severe. Mild pulmonic  stenosis.  8. The inferior vena cava is normal in size with greater than 50%  respiratory  variability, suggesting right atrial pressure of 3 mmHg.   RHC 12/26/19 (on milrinone 0.125):  RA = 11 RV = 43/5 PA = 19/3 (10) PCW = 3 Fick cardiac output/index = 5.22/2.5 Thermo CO/CI = 3.1/1.5 (liekly inaccurate due to severe TR) PVR = 1.3 WU Ao sat = 95% PA sat = 63%, 64%  Renal US 12/20/19 IMPRESSION: Unremarkable renal ultrasound. Electronically Signed   By: Margarette Canada M.D.   On: 12/20/2019 12:47  Discharge Instructions: Discharge Instructions    Diet - low sodium heart healthy   Complete by: As directed    Discharge instructions   Complete by: As directed    You were hospitalized for heart failure. Thank you for allowing [place] to be part of your care.   Please note these changes made to your medications:   Amiodarone 200 mg - take one tablet in the morning and evening Torsemide 20 mg - take four tablets daily  K-Dur 33mEq - take two tablets daily   You will be contacted by hospice to help get set up at home. Please discuss potentially continuing Somatuline with hospice as this may help with quality of life and symptoms of carcinoid per Dr. Marin Olp.   Increase activity slowly   Complete by: As directed       Signed: Molli Hazard A, DO 01/08/2020, 5:03 PM   Pager: [phone]

## 2020-01-14 ENCOUNTER — Ambulatory Visit: Payer: Medicare HMO | Admitting: Cardiology

## 2020-01-21 ENCOUNTER — Inpatient Hospital Stay: Payer: Medicare HMO

## 2020-01-21 ENCOUNTER — Inpatient Hospital Stay: Payer: Medicare HMO | Admitting: Hematology & Oncology

## 2020-01-25 ENCOUNTER — Other Ambulatory Visit: Payer: Self-pay | Admitting: Internal Medicine

## 2020-02-29 ENCOUNTER — Other Ambulatory Visit: Payer: Self-pay | Admitting: Internal Medicine

## 2020-05-06 ENCOUNTER — Telehealth: Payer: Self-pay | Admitting: *Deleted

## 2020-05-06 NOTE — Telephone Encounter (Signed)
Call placed to patient to check on her per order of Dr. Marin Olp.  Pt states that she was admitted to the [facility] yesterday, 05/05/20 and is resting comfortably.  Emotional support given to patient.  Patient appreciative of call and sends her love to Dr. Marin Olp.

## 2020-09-13 IMAGING — CT CT HEAD WITHOUT CONTRAST
3 of 4 series · 13 of 47 positions shown, 15 images · non-contrast
Comparison: None.

CLINICAL DATA: Syncope with fall

EXAM:
CT HEAD WITHOUT CONTRAST
TECHNIQUE: Contiguous axial images were obtained from the base of the skull
through the vertex without intravenous contrast.

[Series 3: head without · axial · non-contrast · 0.50mm/px · z∈[+1110,+1230]mm · 7 of 33 slices shown, 9 images]
[im 5/33  brain]
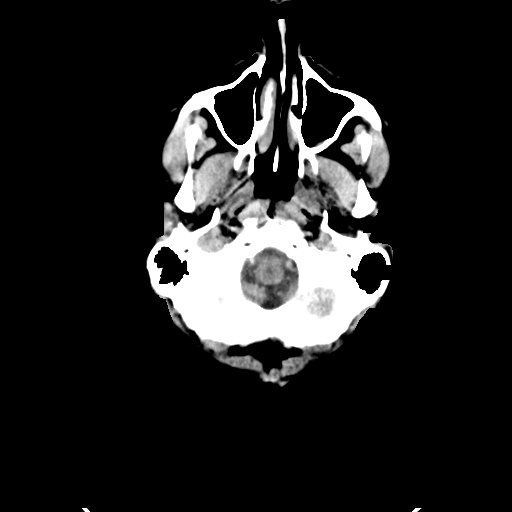
[im 5/33  bone]
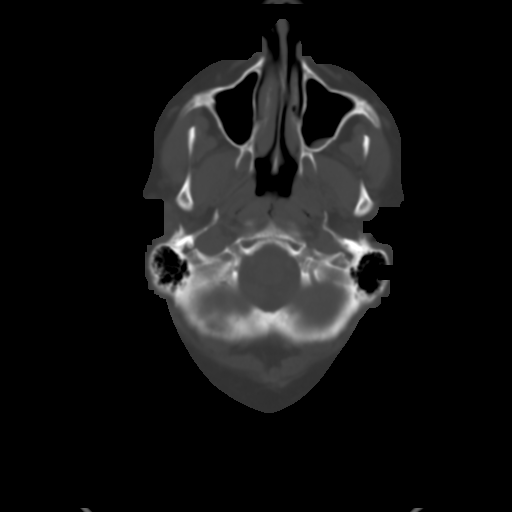
[im 9/33  brain]
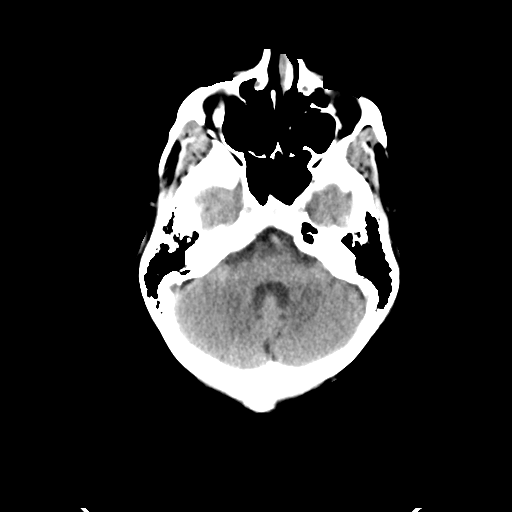
[im 13/33  brain]
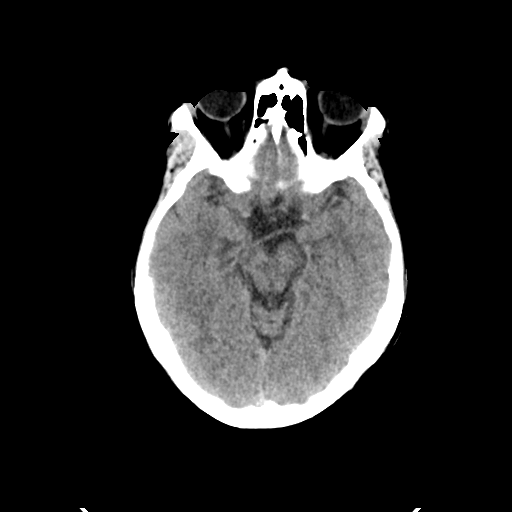
[im 17/33  brain]
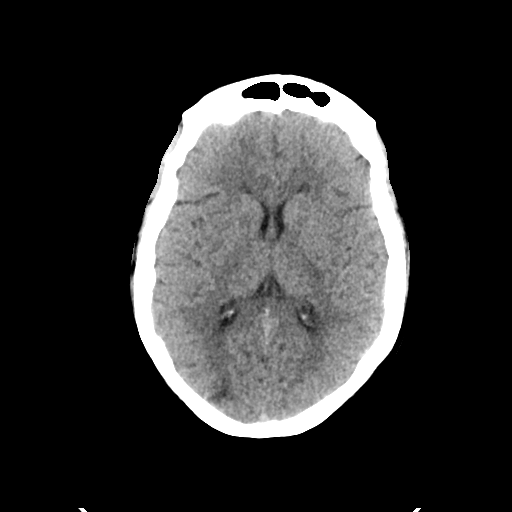
[im 21/33  brain]
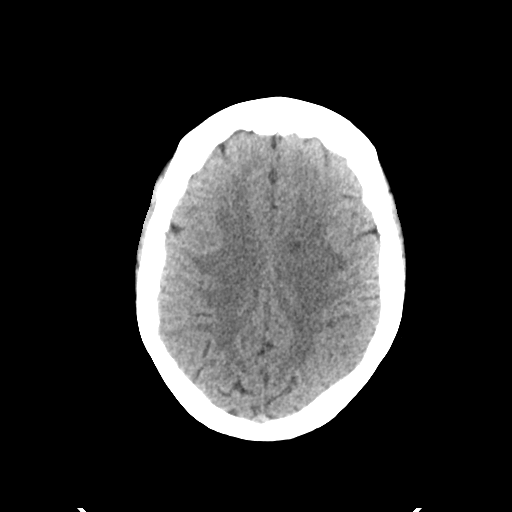
[im 21/33  bone]
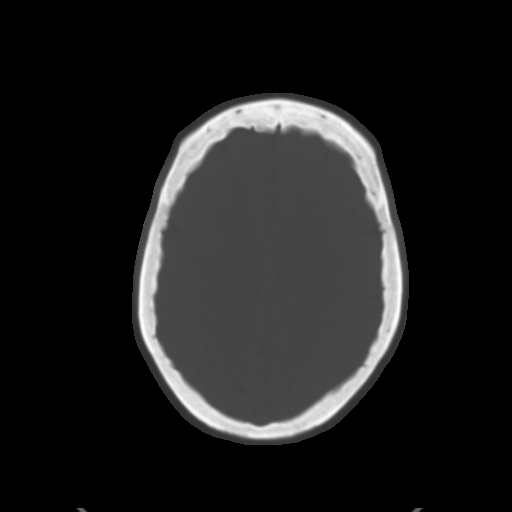
[im 25/33  brain]
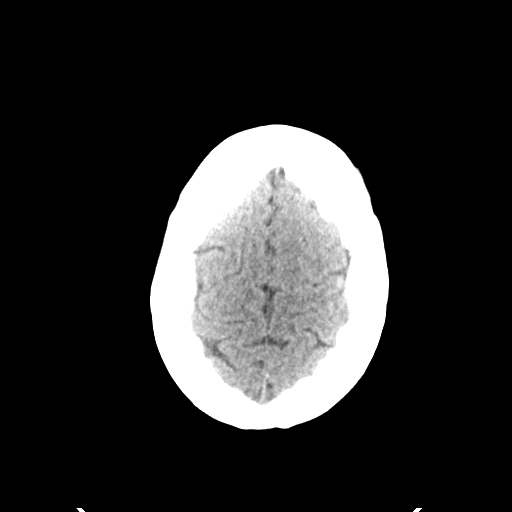
[im 29/33  brain]
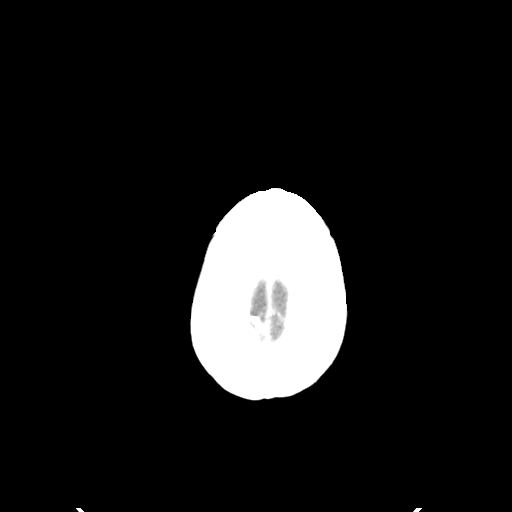

[Series 5: head without cor · coronal · non-contrast · 0.32mm/px · 3 of 71 slices shown]
[im 24/71  brain]
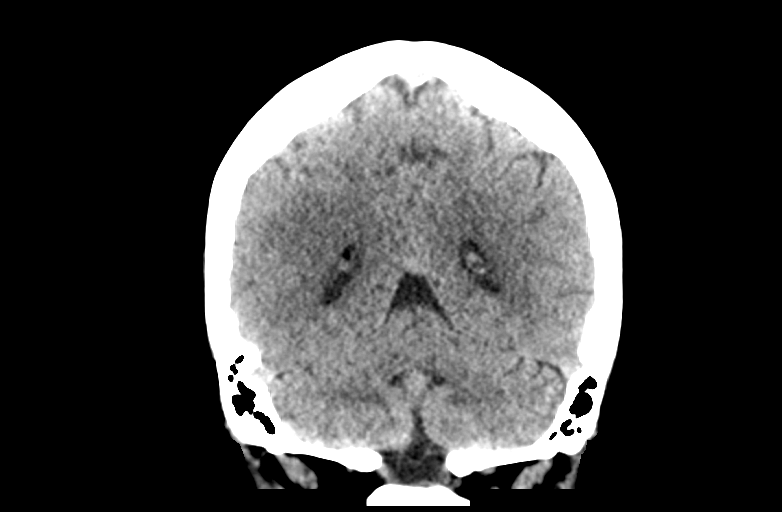
[im 32/71  brain]
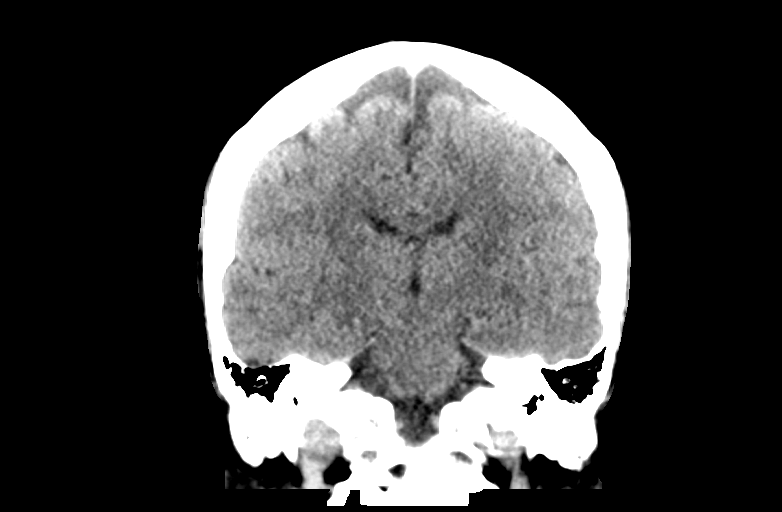
[im 39/71  brain]
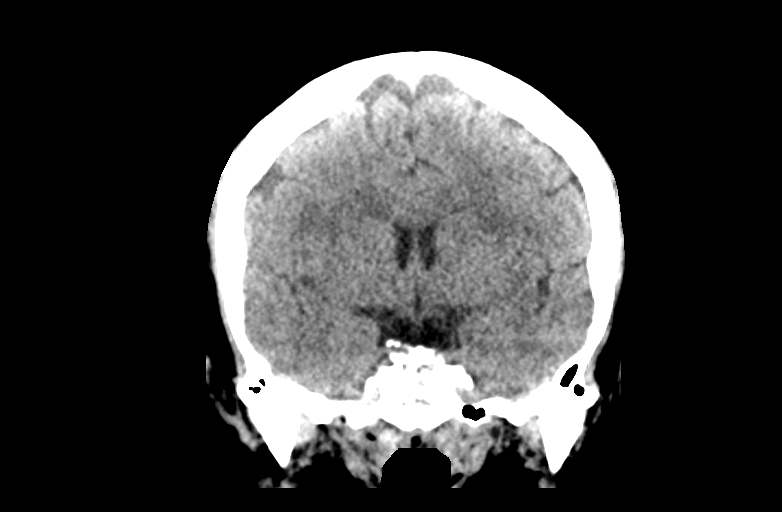

[Series 6: head without sag · sagittal · non-contrast · 0.32mm/px · 3 of 63 slices shown]
[im 21/63  brain]
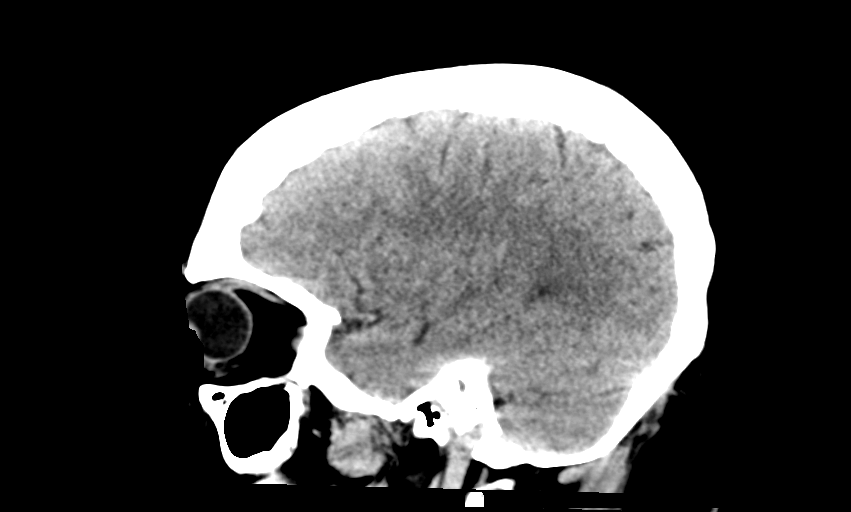
[im 32/63  brain]
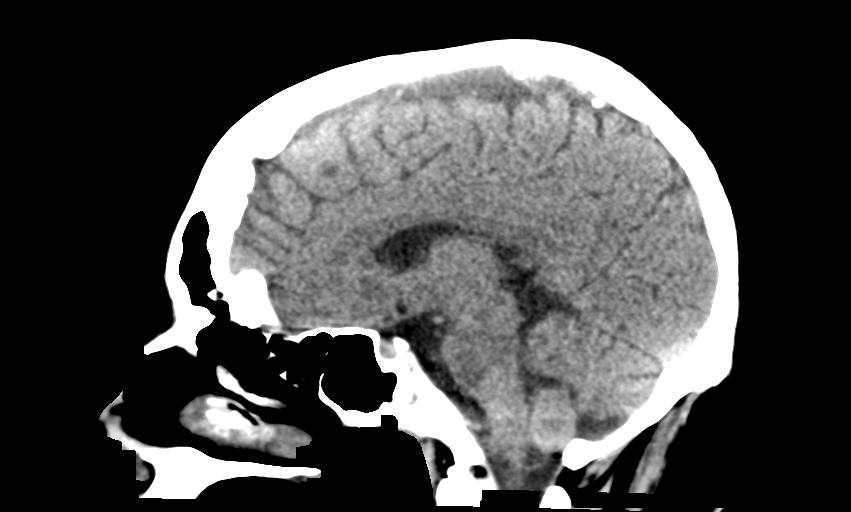
[im 42/63  brain]
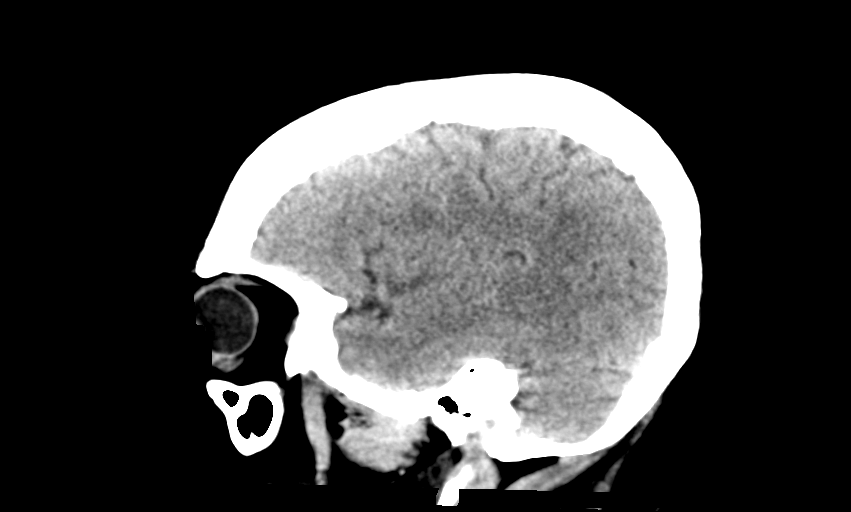

[13 of 47 positions shown; findings below may reference images not displayed]

FINDINGS: Brain: The ventricles are normal in size and configuration. There is
no evident intracranial mass, hemorrhage, extra-axial fluid
collection, or midline shift. There is slight small vessel disease
in the centra semiovale bilaterally. Elsewhere brain parenchyma
appears unremarkable. No evident acute infarct.

Vascular: No hyperdense vessel.  No evident vascular calcification.

Skull: The bony calvarium appears intact.

Sinuses/Orbits: There is mucosal thickening in several ethmoid air
cells. There is also mucosal thickening in the posterior left
sphenoid sinus region. There is a retention cyst in the right
frontal sinus. Orbits appear symmetric bilaterally.

Other: Mastoid air cells are clear.
IMPRESSION: Mild periventricular small vessel disease. No evident acute infarct.
No evident mass or hemorrhage.

Foci of paranasal sinus disease noted.

## 2024-04-25 ENCOUNTER — Encounter: Payer: Self-pay | Admitting: Advanced Practice Midwife
